# Patient Record
Sex: Female | Born: 1970 | Race: White | Hispanic: No | Marital: Single | Smoking: Never smoker
Health system: Southern US, Community
[De-identification: ages and names within clinical notes are randomized; demographics above are authoritative.]

## PROBLEM LIST (undated history)

## (undated) DIAGNOSIS — Z9289 Personal history of other medical treatment: Secondary | ICD-10-CM

## (undated) DIAGNOSIS — E785 Hyperlipidemia, unspecified: Secondary | ICD-10-CM

## (undated) DIAGNOSIS — G8929 Other chronic pain: Secondary | ICD-10-CM

## (undated) DIAGNOSIS — K3189 Other diseases of stomach and duodenum: Secondary | ICD-10-CM

## (undated) DIAGNOSIS — M5030 Other cervical disc degeneration, unspecified cervical region: Secondary | ICD-10-CM

## (undated) DIAGNOSIS — E039 Hypothyroidism, unspecified: Secondary | ICD-10-CM

## (undated) DIAGNOSIS — R0600 Dyspnea, unspecified: Secondary | ICD-10-CM

## (undated) DIAGNOSIS — I219 Acute myocardial infarction, unspecified: Secondary | ICD-10-CM

## (undated) DIAGNOSIS — K7460 Unspecified cirrhosis of liver: Secondary | ICD-10-CM

## (undated) DIAGNOSIS — M549 Dorsalgia, unspecified: Secondary | ICD-10-CM

## (undated) DIAGNOSIS — H269 Unspecified cataract: Secondary | ICD-10-CM

## (undated) DIAGNOSIS — I252 Old myocardial infarction: Secondary | ICD-10-CM

## (undated) DIAGNOSIS — I10 Essential (primary) hypertension: Secondary | ICD-10-CM

## (undated) DIAGNOSIS — M109 Gout, unspecified: Secondary | ICD-10-CM

## (undated) DIAGNOSIS — M1990 Unspecified osteoarthritis, unspecified site: Secondary | ICD-10-CM

## (undated) DIAGNOSIS — E079 Disorder of thyroid, unspecified: Secondary | ICD-10-CM

## (undated) DIAGNOSIS — F32A Depression, unspecified: Secondary | ICD-10-CM

## (undated) DIAGNOSIS — R1310 Dysphagia, unspecified: Secondary | ICD-10-CM

## (undated) DIAGNOSIS — G4730 Sleep apnea, unspecified: Secondary | ICD-10-CM

## (undated) HISTORY — DX: Unspecified cirrhosis of liver: K74.60

## (undated) HISTORY — DX: Other diseases of stomach and duodenum: K31.89

## (undated) HISTORY — DX: Personal history of other medical treatment: Z92.89

## (undated) HISTORY — PX: TUBAL LIGATION: SHX77

## (undated) HISTORY — PX: KNEE ARTHROSCOPY: SHX127

## (undated) HISTORY — DX: Dysphagia, unspecified: R13.10

## (undated) HISTORY — PX: CARDIAC CATHETERIZATION: SHX172

## (undated) HISTORY — DX: Acute myocardial infarction, unspecified: I21.9

---

## 2004-05-14 ENCOUNTER — Other Ambulatory Visit: Payer: Self-pay

## 2004-09-04 ENCOUNTER — Other Ambulatory Visit: Payer: Self-pay

## 2004-10-14 ENCOUNTER — Other Ambulatory Visit: Payer: Self-pay

## 2004-10-14 ENCOUNTER — Emergency Department: Payer: Self-pay | Admitting: Emergency Medicine

## 2004-12-16 ENCOUNTER — Emergency Department: Payer: Self-pay | Admitting: Emergency Medicine

## 2004-12-17 ENCOUNTER — Ambulatory Visit: Payer: Self-pay | Admitting: Emergency Medicine

## 2005-01-08 ENCOUNTER — Emergency Department: Payer: Self-pay | Admitting: Emergency Medicine

## 2005-01-25 ENCOUNTER — Emergency Department: Payer: Self-pay | Admitting: Emergency Medicine

## 2005-01-27 ENCOUNTER — Emergency Department: Payer: Self-pay | Admitting: Emergency Medicine

## 2005-01-31 ENCOUNTER — Emergency Department: Payer: Self-pay | Admitting: Emergency Medicine

## 2005-02-10 ENCOUNTER — Encounter: Payer: Self-pay | Admitting: Advanced Practice Midwife

## 2005-02-11 ENCOUNTER — Encounter: Payer: Self-pay | Admitting: Advanced Practice Midwife

## 2005-06-19 ENCOUNTER — Observation Stay: Payer: Self-pay

## 2005-06-23 ENCOUNTER — Observation Stay: Payer: Self-pay | Admitting: Unknown Physician Specialty

## 2005-06-25 ENCOUNTER — Inpatient Hospital Stay: Payer: Self-pay | Admitting: Obstetrics & Gynecology

## 2006-05-19 ENCOUNTER — Emergency Department: Payer: Self-pay | Admitting: Emergency Medicine

## 2007-03-28 ENCOUNTER — Emergency Department: Payer: Self-pay | Admitting: Unknown Physician Specialty

## 2007-03-28 ENCOUNTER — Other Ambulatory Visit: Payer: Self-pay

## 2007-09-14 ENCOUNTER — Emergency Department: Payer: Self-pay | Admitting: Emergency Medicine

## 2007-12-15 ENCOUNTER — Emergency Department: Payer: Self-pay | Admitting: Emergency Medicine

## 2008-02-01 ENCOUNTER — Emergency Department: Payer: Self-pay | Admitting: Emergency Medicine

## 2008-02-23 ENCOUNTER — Ambulatory Visit: Payer: Self-pay | Admitting: Orthopaedic Surgery

## 2008-03-01 ENCOUNTER — Ambulatory Visit: Payer: Self-pay | Admitting: Orthopaedic Surgery

## 2008-03-22 ENCOUNTER — Emergency Department: Payer: Self-pay | Admitting: Emergency Medicine

## 2008-07-30 ENCOUNTER — Emergency Department: Payer: Self-pay | Admitting: Emergency Medicine

## 2008-07-31 ENCOUNTER — Emergency Department: Payer: Self-pay | Admitting: Emergency Medicine

## 2008-10-03 ENCOUNTER — Encounter
Admission: RE | Admit: 2008-10-03 | Discharge: 2008-10-03 | Payer: Self-pay | Admitting: Physical Medicine & Rehabilitation

## 2009-02-24 ENCOUNTER — Emergency Department: Payer: Self-pay | Admitting: Emergency Medicine

## 2009-07-01 ENCOUNTER — Ambulatory Visit: Payer: Self-pay

## 2009-07-02 ENCOUNTER — Emergency Department: Payer: Self-pay | Admitting: Emergency Medicine

## 2009-11-18 ENCOUNTER — Ambulatory Visit: Payer: Self-pay | Admitting: General Practice

## 2010-04-26 ENCOUNTER — Emergency Department: Payer: Self-pay | Admitting: Emergency Medicine

## 2010-09-18 ENCOUNTER — Emergency Department: Payer: Self-pay | Admitting: Emergency Medicine

## 2011-08-20 ENCOUNTER — Ambulatory Visit: Payer: Self-pay | Admitting: Family

## 2011-09-16 ENCOUNTER — Emergency Department: Payer: Self-pay | Admitting: Emergency Medicine

## 2012-02-10 ENCOUNTER — Ambulatory Visit: Payer: Self-pay | Admitting: Internal Medicine

## 2012-04-07 ENCOUNTER — Inpatient Hospital Stay: Payer: Self-pay | Admitting: Internal Medicine

## 2012-04-07 LAB — COMPREHENSIVE METABOLIC PANEL
Albumin: 3.3 g/dL — ABNORMAL LOW (ref 3.4–5.0)
Alkaline Phosphatase: 67 U/L (ref 50–136)
Anion Gap: 9 (ref 7–16)
Calcium, Total: 8.3 mg/dL — ABNORMAL LOW (ref 8.5–10.1)
Chloride: 106 mmol/L (ref 98–107)
EGFR (African American): >60
Glucose: 87 mg/dL (ref 65–99)
Potassium: 3.8 mmol/L (ref 3.5–5.1)
SGPT (ALT): 74 U/L
Sodium: 139 mmol/L (ref 136–145)
Total Protein: 7.3 g/dL (ref 6.4–8.2)

## 2012-04-07 LAB — CBC
MCHC: 32 g/dL (ref 32.0–36.0)
MCV: 78 fL — ABNORMAL LOW (ref 80–100)
Platelet: 88 10*3/uL — ABNORMAL LOW (ref 150–440)
RDW: 14.5 % (ref 11.5–14.5)
WBC: 3.1 10*3/uL — ABNORMAL LOW (ref 3.6–11.0)

## 2012-04-07 LAB — URINALYSIS, COMPLETE
Bilirubin,UR: NEGATIVE
Glucose,UR: NEGATIVE mg/dL (ref 0–75)
Leukocyte Esterase: NEGATIVE
Ph: 6 (ref 4.5–8.0)
Specific Gravity: 1.004 (ref 1.003–1.030)
WBC UR: 1 /HPF (ref 0–5)

## 2012-04-07 LAB — CK TOTAL AND CKMB (NOT AT ARMC)
CK, Total: 43 U/L (ref 21–215)
CK, Total: 32 U/L (ref 21–215)
CK-MB: <0.5 ng/mL — ABNORMAL LOW (ref 0.5–3.6)
CK-MB: <0.5 ng/mL — ABNORMAL LOW (ref 0.5–3.6)

## 2012-04-07 LAB — TROPONIN I: Troponin-I: 0.27 ng/mL — ABNORMAL HIGH

## 2012-04-07 LAB — DRUG SCREEN, URINE
Barbiturates, Ur Screen: NEGATIVE (ref ?–200)
Methadone, Ur Screen: NEGATIVE (ref ?–300)
Opiate, Ur Screen: NEGATIVE (ref ?–300)
Tricyclic, Ur Screen: NEGATIVE (ref ?–1000)

## 2012-04-07 LAB — PROTIME-INR: Prothrombin Time: 14.2 secs (ref 11.5–14.7)

## 2012-04-07 LAB — APTT: Activated PTT: 32 secs (ref 23.6–35.9)

## 2012-04-08 LAB — TROPONIN I: Troponin-I: 0.26 ng/mL — ABNORMAL HIGH

## 2012-04-08 LAB — CBC WITH DIFFERENTIAL/PLATELET
Basophil #: 0 10*3/uL (ref 0.0–0.1)
Basophil %: 0.5 %
Eosinophil #: 0.1 10*3/uL (ref 0.0–0.7)
Eosinophil %: 2.9 %
HCT: 27.5 % — ABNORMAL LOW (ref 35.0–47.0)
HGB: 8.9 g/dL — ABNORMAL LOW (ref 12.0–16.0)
Lymphocyte %: 36.9 %
MCH: 25.5 pg — ABNORMAL LOW (ref 26.0–34.0)
MCV: 78 fL — ABNORMAL LOW (ref 80–100)
Monocyte %: 8.8 %
Neutrophil %: 50.9 %
Platelet: 73 10*3/uL — ABNORMAL LOW (ref 150–440)
RBC: 3.51 10*6/uL — ABNORMAL LOW (ref 3.80–5.20)
RDW: 14.9 % — ABNORMAL HIGH (ref 11.5–14.5)
WBC: 3.2 10*3/uL — ABNORMAL LOW (ref 3.6–11.0)

## 2012-04-08 LAB — LIPID PANEL
Cholesterol: 101 mg/dL (ref 0–200)
Ldl Cholesterol, Calc: 56 mg/dL (ref 0–100)

## 2012-04-08 LAB — CK TOTAL AND CKMB (NOT AT ARMC)
CK, Total: 26 U/L (ref 21–215)
CK-MB: <0.5 ng/mL — ABNORMAL LOW (ref 0.5–3.6)

## 2012-04-08 LAB — BASIC METABOLIC PANEL
Anion Gap: 6 — ABNORMAL LOW (ref 7–16)
Chloride: 107 mmol/L (ref 98–107)
Co2: 28 mmol/L (ref 21–32)
Creatinine: 0.52 mg/dL — ABNORMAL LOW (ref 0.60–1.30)
EGFR (African American): >60
EGFR (Non-African Amer.): >60
Osmolality: 281 (ref 275–301)
Potassium: 3.8 mmol/L (ref 3.5–5.1)
Sodium: 141 mmol/L (ref 136–145)

## 2012-04-08 LAB — MAGNESIUM: Magnesium: 1.6 mg/dL — ABNORMAL LOW

## 2012-07-02 LAB — COMPREHENSIVE METABOLIC PANEL
Alkaline Phosphatase: 77 U/L (ref 50–136)
Anion Gap: 9 (ref 7–16)
BUN: 9 mg/dL (ref 7–18)
Bilirubin,Total: 0.4 mg/dL (ref 0.2–1.0)
Calcium, Total: 8.8 mg/dL (ref 8.5–10.1)
Chloride: 104 mmol/L (ref 98–107)
Co2: 29 mmol/L (ref 21–32)
Creatinine: 0.58 mg/dL — ABNORMAL LOW (ref 0.60–1.30)
EGFR (African American): >60
EGFR (Non-African Amer.): >60
SGPT (ALT): 74 U/L
Total Protein: 7.9 g/dL (ref 6.4–8.2)

## 2012-07-02 LAB — CBC
HCT: 35.4 % (ref 35.0–47.0)
MCHC: 32.4 g/dL (ref 32.0–36.0)
Platelet: 112 10*3/uL — ABNORMAL LOW (ref 150–440)
WBC: 4.3 10*3/uL (ref 3.6–11.0)

## 2012-07-03 ENCOUNTER — Inpatient Hospital Stay: Payer: Self-pay | Admitting: Internal Medicine

## 2012-07-03 LAB — LIPID PANEL
HDL Cholesterol: 18 mg/dL — ABNORMAL LOW (ref 40–60)
Triglycerides: 128 mg/dL (ref 0–200)
VLDL Cholesterol, Calc: 26 mg/dL (ref 5–40)

## 2012-07-03 LAB — APTT
Activated PTT: 32 secs (ref 23.6–35.9)
Activated PTT: 76.1 secs — ABNORMAL HIGH (ref 23.6–35.9)

## 2012-07-03 LAB — TSH: Thyroid Stimulating Horm: <0.01 u[IU]/mL — ABNORMAL LOW

## 2012-07-03 LAB — TROPONIN I
Troponin-I: 0.14 ng/mL — ABNORMAL HIGH
Troponin-I: 0.14 ng/mL — ABNORMAL HIGH

## 2012-07-03 LAB — CK TOTAL AND CKMB (NOT AT ARMC)
CK, Total: 54 U/L (ref 21–215)
CK-MB: <0.5 ng/mL — ABNORMAL LOW (ref 0.5–3.6)

## 2012-07-03 LAB — T4, FREE: Free Thyroxine: 1.81 ng/dL — ABNORMAL HIGH (ref 0.76–1.46)

## 2012-07-04 DIAGNOSIS — R748 Abnormal levels of other serum enzymes: Secondary | ICD-10-CM

## 2012-07-04 LAB — PROTIME-INR
INR: 1
Prothrombin Time: 14 secs (ref 11.5–14.7)

## 2012-07-04 LAB — IRON AND TIBC
Iron Bind.Cap.(Total): 393 ug/dL (ref 250–450)
Iron: 33 ug/dL — ABNORMAL LOW (ref 50–170)
Unbound Iron-Bind.Cap.: 360 ug/dL

## 2012-07-04 LAB — COMPREHENSIVE METABOLIC PANEL
Anion Gap: 9 (ref 7–16)
Bilirubin,Total: 0.2 mg/dL (ref 0.2–1.0)
Calcium, Total: 9.6 mg/dL (ref 8.5–10.1)
Chloride: 103 mmol/L (ref 98–107)
Co2: 29 mmol/L (ref 21–32)
Creatinine: 0.55 mg/dL — ABNORMAL LOW (ref 0.60–1.30)
EGFR (African American): >60
EGFR (Non-African Amer.): >60
Osmolality: 282 (ref 275–301)
Sodium: 141 mmol/L (ref 136–145)

## 2012-07-04 LAB — CBC WITH DIFFERENTIAL/PLATELET
Basophil %: 0.6 %
Eosinophil %: 2.3 %
HCT: 32.8 % — ABNORMAL LOW (ref 35.0–47.0)
HGB: 10.7 g/dL — ABNORMAL LOW (ref 12.0–16.0)
Lymphocyte %: 46.3 %
MCH: 25.7 pg — ABNORMAL LOW (ref 26.0–34.0)
Monocyte #: 0.2 x10 3/mm (ref 0.2–0.9)
Monocyte %: 7.9 %
Neutrophil %: 42.9 %

## 2012-07-04 LAB — APTT: Activated PTT: 71.8 secs — ABNORMAL HIGH (ref 23.6–35.9)

## 2012-07-04 LAB — FERRITIN: Ferritin (ARMC): 12 ng/mL (ref 8–388)

## 2012-09-22 ENCOUNTER — Ambulatory Visit: Payer: Self-pay | Admitting: Family

## 2012-10-24 ENCOUNTER — Ambulatory Visit: Payer: Self-pay | Admitting: Pain Medicine

## 2012-10-31 ENCOUNTER — Ambulatory Visit: Payer: Self-pay | Admitting: Pain Medicine

## 2012-11-17 ENCOUNTER — Ambulatory Visit: Payer: Self-pay | Admitting: Pain Medicine

## 2013-02-13 ENCOUNTER — Ambulatory Visit: Payer: Self-pay | Admitting: Pain Medicine

## 2013-02-16 ENCOUNTER — Ambulatory Visit: Payer: Self-pay | Admitting: Pain Medicine

## 2013-03-06 ENCOUNTER — Ambulatory Visit: Payer: Self-pay | Admitting: Pain Medicine

## 2013-05-11 ENCOUNTER — Emergency Department: Payer: Self-pay | Admitting: Emergency Medicine

## 2013-05-30 ENCOUNTER — Emergency Department: Payer: Self-pay | Admitting: Emergency Medicine

## 2013-05-30 LAB — COMPREHENSIVE METABOLIC PANEL
Albumin: 4.4 g/dL (ref 3.4–5.0)
Alkaline Phosphatase: 89 U/L (ref 50–136)
Anion Gap: 3 — ABNORMAL LOW (ref 7–16)
BUN: 13 mg/dL (ref 7–18)
Calcium, Total: 9.5 mg/dL (ref 8.5–10.1)
Creatinine: 0.72 mg/dL (ref 0.60–1.30)
EGFR (Non-African Amer.): >60
Glucose: 96 mg/dL (ref 65–99)
Osmolality: 272 (ref 275–301)
Potassium: 3.4 mmol/L — ABNORMAL LOW (ref 3.5–5.1)
SGOT(AST): 163 U/L — ABNORMAL HIGH (ref 15–37)
SGPT (ALT): 144 U/L — ABNORMAL HIGH (ref 12–78)
Sodium: 136 mmol/L (ref 136–145)
Total Protein: 8.8 g/dL — ABNORMAL HIGH (ref 6.4–8.2)

## 2013-05-30 LAB — URINALYSIS, COMPLETE
Bacteria: NONE SEEN
Bilirubin,UR: NEGATIVE
Glucose,UR: NEGATIVE mg/dL (ref 0–75)
Nitrite: NEGATIVE
Ph: 6 (ref 4.5–8.0)
Protein: NEGATIVE
RBC,UR: 1 /HPF (ref 0–5)

## 2013-05-30 LAB — CBC
HCT: 42 % (ref 35.0–47.0)
MCH: 27.9 pg (ref 26.0–34.0)
MCHC: 34.3 g/dL (ref 32.0–36.0)
Platelet: 134 10*3/uL — ABNORMAL LOW (ref 150–440)
RBC: 5.15 10*6/uL (ref 3.80–5.20)
WBC: 4.6 10*3/uL (ref 3.6–11.0)

## 2013-08-23 LAB — BASIC METABOLIC PANEL WITH GFR
Anion Gap: 8
BUN: 13 mg/dL
Calcium, Total: 8.9 mg/dL
Chloride: 107 mmol/L
Co2: 25 mmol/L
Creatinine: 0.66 mg/dL
EGFR (African American): >60
EGFR (Non-African Amer.): >60
Glucose: 93 mg/dL
Osmolality: 279
Potassium: 3.8 mmol/L
Sodium: 140 mmol/L

## 2013-08-23 LAB — CBC
HCT: 35.1 %
HGB: 12 g/dL
MCH: 28.6 pg
MCHC: 34.2 g/dL
MCV: 84 fL
Platelet: 96 x10 3/mm 3 — ABNORMAL LOW
RBC: 4.19 X10 6/mm 3
RDW: 14.9 % — ABNORMAL HIGH
WBC: 5 x10 3/mm 3

## 2013-08-24 ENCOUNTER — Inpatient Hospital Stay: Payer: Self-pay | Admitting: Internal Medicine

## 2013-08-24 LAB — TROPONIN I
Troponin-I: 0.21 ng/mL — ABNORMAL HIGH
Troponin-I: 0.22 ng/mL — ABNORMAL HIGH

## 2013-08-24 LAB — DRUG SCREEN, URINE
Amphetamines, Ur Screen: NEGATIVE (ref ?–1000)
Barbiturates, Ur Screen: NEGATIVE (ref ?–200)
Benzodiazepine, Ur Scrn: NEGATIVE (ref ?–200)
Methadone, Ur Screen: NEGATIVE (ref ?–300)
Opiate, Ur Screen: NEGATIVE (ref ?–300)

## 2013-08-24 LAB — CK-MB
CK-MB: 0.9 ng/mL (ref 0.5–3.6)
CK-MB: 0.9 ng/mL (ref 0.5–3.6)

## 2013-08-25 LAB — BASIC METABOLIC PANEL
Anion Gap: 4 — ABNORMAL LOW (ref 7–16)
BUN: 9 mg/dL (ref 7–18)
Calcium, Total: 8.8 mg/dL (ref 8.5–10.1)
Chloride: 104 mmol/L (ref 98–107)
Co2: 29 mmol/L (ref 21–32)
EGFR (Non-African Amer.): >60
Glucose: 97 mg/dL (ref 65–99)
Osmolality: 272 (ref 275–301)
Potassium: 4 mmol/L (ref 3.5–5.1)
Sodium: 137 mmol/L (ref 136–145)

## 2013-08-25 LAB — CBC WITH DIFFERENTIAL/PLATELET
Basophil #: 0 10*3/uL (ref 0.0–0.1)
Basophil %: 0.6 %
Eosinophil #: 0.1 10*3/uL (ref 0.0–0.7)
HCT: 32.3 % — ABNORMAL LOW (ref 35.0–47.0)
HGB: 11 g/dL — ABNORMAL LOW (ref 12.0–16.0)
MCH: 28.9 pg (ref 26.0–34.0)
MCHC: 34.2 g/dL (ref 32.0–36.0)
MCV: 84 fL (ref 80–100)
Monocyte %: 7.6 %
Neutrophil %: 52.9 %
Platelet: 78 10*3/uL — ABNORMAL LOW (ref 150–440)
RDW: 15.2 % — ABNORMAL HIGH (ref 11.5–14.5)
WBC: 3 10*3/uL — ABNORMAL LOW (ref 3.6–11.0)

## 2013-08-25 LAB — LIPID PANEL
Ldl Cholesterol, Calc: 67 mg/dL (ref 0–100)
Triglycerides: 107 mg/dL (ref 0–200)

## 2013-08-31 ENCOUNTER — Emergency Department: Payer: Self-pay | Admitting: Emergency Medicine

## 2013-09-04 ENCOUNTER — Emergency Department: Payer: Self-pay | Admitting: Emergency Medicine

## 2013-11-30 ENCOUNTER — Emergency Department: Payer: Self-pay | Admitting: Emergency Medicine

## 2014-02-14 ENCOUNTER — Ambulatory Visit: Payer: Self-pay

## 2014-04-07 ENCOUNTER — Emergency Department: Payer: Self-pay | Admitting: Emergency Medicine

## 2014-04-30 DIAGNOSIS — E039 Hypothyroidism, unspecified: Secondary | ICD-10-CM | POA: Insufficient documentation

## 2014-04-30 DIAGNOSIS — G8929 Other chronic pain: Secondary | ICD-10-CM | POA: Insufficient documentation

## 2014-04-30 DIAGNOSIS — I10 Essential (primary) hypertension: Secondary | ICD-10-CM | POA: Insufficient documentation

## 2014-04-30 DIAGNOSIS — F1920 Other psychoactive substance dependence, uncomplicated: Secondary | ICD-10-CM | POA: Insufficient documentation

## 2014-04-30 DIAGNOSIS — I2510 Atherosclerotic heart disease of native coronary artery without angina pectoris: Secondary | ICD-10-CM | POA: Insufficient documentation

## 2014-04-30 DIAGNOSIS — IMO0002 Reserved for concepts with insufficient information to code with codable children: Secondary | ICD-10-CM | POA: Insufficient documentation

## 2014-04-30 DIAGNOSIS — F329 Major depressive disorder, single episode, unspecified: Secondary | ICD-10-CM | POA: Insufficient documentation

## 2014-04-30 DIAGNOSIS — F419 Anxiety disorder, unspecified: Secondary | ICD-10-CM | POA: Insufficient documentation

## 2014-04-30 DIAGNOSIS — E785 Hyperlipidemia, unspecified: Secondary | ICD-10-CM | POA: Insufficient documentation

## 2015-03-20 ENCOUNTER — Ambulatory Visit: Admit: 2015-03-20 | Disposition: A | Discharge status: Home / Self Care | Payer: Self-pay

## 2015-04-02 NOTE — H&P (Signed)
PATIENT NAME:  Brandi Bright, LOUK MR#:  [id number] DATE OF BIRTH:  [birth year]  DATE OF ADMISSION:  07/03/2012  REFERRING PHYSICIAN: Belva Bertin, MD  PRIMARY CARE PHYSICIAN: Cletis Athens, MD  CHIEF COMPLAINT: Neck pain and right arm pain.   HISTORY OF PRESENT ILLNESS: This is a 44 year old female with significant past medical history of hypertension, hypothyroidism, and coronary artery disease recently admitted to [facility] for similar complaints in April of this year where she was diagnosed with non-ST-elevated myocardial infarction where she was started on aspirin, beta blocker, and ACE. The patient had a negative stress test done in April of this year. The patient presents today with similar complaints where she reports this afternoon she started to feel neck pain in the right side and right arm pain and some mild heaviness where she was found to have elevated troponin of 0.16. The patient's last troponin was 0.26 upon discharge in April of this year. The patient did not have any significant EKG changes. The patient reports her pain got better with aspirin and sublingual nitroglycerin. The patient denies any altered mental status, loss of consciousness, syncope, slurred speech, any focal deficits, any coffee-ground emesis, bright red blood per rectum, dysuria, or polyuria, but she complains of nausea and diaphoresis. She denies any chest pain, but reports some mild chest discomfort.   PAST MEDICAL HISTORY:  1. Hypothyroidism.  2. Coronary artery disease.  3. Restless leg syndrome.  4. Anxiety.  5. Gastroesophageal reflux disease.  6. Anemia.  7. Chronic thrombocytopenia.   PAST SURGICAL HISTORY: Cesarean section.   FAMILY HISTORY: Mother has history of hypertension, diabetes, and chronic obstructive pulmonary disease. Father has hypertension, chronic obstructive pulmonary disease, heart disease, and colon and lung cancer.   SOCIAL HISTORY: The patient denies any smoking,  alcohol, or drug use.   REVIEW OF SYSTEMS: CONSTITUTIONAL: The patient denies any fever, fatigue, or weakness. EYES: Denies any blurry vision, double vision, or pain. ENT: Denies tinnitus, ear pain, or hearing loss. RESPIRATORY: Denies any cough, wheezing, hemoptysis, or dyspnea. CARDIOVASCULAR: Complains of mild chest discomfort. Denies any orthopnea, edema, arrhythmia, palpitations, or syncope. GASTROINTESTINAL: Complains of mild nausea. Denies any vomiting, diarrhea, abdominal pain, or hematemesis. GENITOURINARY: Denies dysuria, hematuria, or renal colic. ENDOCRINE: Denies polyuria, polydipsia, or heat or cold intolerance. HEMATOLOGY: Has history of anemia and low platelet. Denies any easy bruising or bleeding diathesis. INTEGUMENTARY: Denies any acne or rash. MUSCULOSKELETAL: Complains of neck pain and right arm pain. Denies any lower back pain, swelling, or gout. NEURO: Complained of right arm heaviness. Denies any dysarthria, epilepsy, tremors, or vertigo. PSYCH: Denies any insomnia or schizophrenia. Has history of anxiety.   PHYSICAL EXAMINATION:   VITAL SIGNS: Temperature 99, pulse 70, respiratory rate 18, blood pressure 101/70, and saturating 97% on room air.   GENERAL: Well-nourished female who is comfortable and in no apparent distress.   HEENT: Head atraumatic, normocephalic. Pupils equal and reactive to light. Pink conjunctivae. Anicteric sclerae. Moist oral mucosa.   NECK: Supple. No thyromegaly. No JVD.   CHEST: Good air entry bilaterally. No wheezing, rales, or rhonchi.   CARDIOVASCULAR: S1 and S2 heard. No rubs, murmurs, or gallops.   ABDOMEN: Soft, nontender, and nondistended. Bowel sounds present.   EXTREMITIES: No edema. No clubbing and no cyanosis.   PSYCHIATRIC: Appropriate affect. Awake and alert x3. Intact judgment and insight.   NEURO: Cranial nerves grossly intact. Motor 5/5 in all extremities. No deficits and sensation symmetrical and intact.   PERTINENT  LABORATORY AND DIAGNOSTICS: Glucose 96, BUN 9, creatinine 0.58, sodium 142, potassium 3.8, chloride 104, AND CO2 29. Troponin 0.16. White blood cells 4.3, hemoglobin 11.5, hematocrit 35.4, and platelet 112.   EKG is showing normal sinus rhythm. No significant ST or T wave changes.   ASSESSMENT AND PLAN:  1. Right arm and neck pain with elevated troponins. This is similar presentation during last admission in April of this year where the patient was diagnosed with non-ST-elevated myocardial infarction, even though she had negative stress test then. The patient was given 325 mg of aspirin, will be continued on ACE inhibitor, beta blocker, and aspirin. We will a check lipid panel in the a.m. We will cycle three sets of cardiac enzymes and followup the trend. We will start her on heparin drip for presumed non-ST-elevated myocardial infarction and we will consult the cardiology service in the a.m. She will be kept n.p.o. at first for any possible imaging.  2. Hypertension, controlled. Continue with home medication.  3. Hypothyroidism. Check free T4 and TSH. Continue with Synthroid.  4. Gastroesophageal reflux disease. Continue with Protonix.   CODE STATUS: FULL CODE.   TOTAL TIME SPENT ON PATIENT CARE: 45 minutes.  ____________________________ Albertine Patricia, MD dse:slb D: 07/03/2012 01:15:50 ET    T: 07/03/2012 11:28:23 ET       JOB#: [id number] cc: Albertine Patricia, MD, <Dictator> Cletis Athens, MD DAWOOD Graciela Husbands MD ELECTRONICALLY SIGNED 07/03/2012 23:11

## 2015-04-02 NOTE — Consult Note (Signed)
PATIENT NAME:  Brandi Bright, Brandi Bright MR#:  [id number] DATE OF BIRTH:  1971-02-07  DATE OF CONSULTATION:  07/03/2012  REFERRING PHYSICIAN:   CONSULTING PHYSICIAN:  Cletis Athens, MD  HISTORY OF PRESENT ILLNESS: This is a 44 year old female who was admitted into the hospital with right-sided neck pain and right shoulder pain. The pain lasted for two hours. She denies any history of chest pain but she claimed that the pain is similar to when she had her heart attack about four months ago. She became nauseous, did not vomit. There is no sweating. The patient has a history of non-ST-elevated myocardial infarction in April of 2013, also known to have hypertension, hypothyroidism, gastroesophageal reflux disease, and thrombocytopenia. She has chronic liver disease with an abnormal liver function test. The patient says that she is not very compliant on her diet. The patient takes an aspirin, beta blocker, and ACE inhibitor. Her previous stress test was unremarkable   PAST MEDICAL HISTORY:  1. Hypothyroidism. 2. Coronary artery disease.  3. Restless leg syndrome. 4. Anxiety neurosis. 5. Gastroesophageal reflux disease. 6. Anemia.  7. Chronic thrombocytopenia.   FAMILY HISTORY: History is significant for hypertension, diabetes, and chronic obstructive pulmonary disease. Father has hypertension, chronic obstructive pulmonary disease and heart disease. Colon and lung cancer run in the family.  REVIEW OF SYSTEMS: She denies any history of fevers, chills, wheezing, nausea or vomiting, or GU symptoms. Hematology is negative except for thrombocytopenia and she has abnormal liver function tests.   PHYSICAL EXAMINATION:   GENERAL: The patient is a well-nourished female. She is alert and cooperative and in no acute distress at the time of examination.  VITALS: Temperature 97, blood pressure 470 systolic and diastolic 70, and oxygen saturation 97%.   HEENT: Head is normocephalic. Pupils are reactive. Sclerae  anicteric. Tongue is moist, papillated.   NECK: Supple. Jugular venous pressure is not elevated. Carotid upstroke is 2+ without any bruit. There is no goiter. No lymphadenopathy. Trachea is central.   CARDIOVASCULAR: Apical impulse is not palpable. Both first and second heart sounds are normal. No murmur is audible.   CHEST: Decreased breath sounds without any rales or rhonchi.   ABDOMEN: Soft and nontender without any hepatosplenomegaly.   NEUROLOGIC: Unremarkable.   RESULTS: Electrocardiogram does not show any acute changes.   HDL is 18. Creatinine is 0.58. Troponin is borderline elevated at 0.16, 0.14, and 0.14. CPK total is 54 with positive CK-MB. Thyroxine free is 1.81 and TSH is 0.010. Hemoglobin is 11.5 and platelet count is 112.   IMPRESSION: 1. Angina pectoris.  2. Non-ST-elevated myocardial infarction. 3. Chronic obstructive pulmonary disease. 4. Abnormal liver function tests. 5. Hypertension, under control at the present time.  6. Low TSH. We will probably have to decrease the dose of Synthroid. 7. Gastroesophageal reflux disease.        ASSESSMENT AND RECOMMENDATIONS: The patient's symptoms and lab data are suggestive of subendocardial infarction. I told the patient that she will need a cardiac catheterization and we will try to arrange it as soon as possible. In the meantime, we will continue her present medications. ____________________________ Cletis Athens, MD jm:slb D: 07/03/2012 14:27:59 ET T: 07/03/2012 15:36:08 ET JOB#: [id number]  cc: Cletis Athens, MD, <Dictator> Cletis Athens MD ELECTRONICALLY SIGNED 08/04/2012 19:21

## 2015-04-02 NOTE — Discharge Summary (Signed)
PATIENT NAME:  Brandi Bright, Brandi Bright MR#:  [id number] DATE OF BIRTH:  01-Sep-1971  DATE OF ADMISSION:  07/03/2012 DATE OF DISCHARGE:  07/05/2012  ADMITTING PHYSICIAN: Phillips Climes, MD  DISCHARGING PHYSICIAN: Gladstone Lighter, MD  PRIMARY CARE PHYSICIAN: Cletis Athens, MD  CONSULTANTS: 1. Cletis Athens, MD - Cardiology. 2. Lujean Amel, MD - Cardiology (catheterization).  DISCHARGE DIAGNOSES:  1. Unstable angina.  2. Elevated troponin secondary to demand ischemia. 3. Low normal blood pressure while in the hospital.  4. Gastroesophageal reflux disease.  5. Anxiety.  6. Hyperthyroidism while on thyroid supplements.   DISCHARGE MEDICATIONS:  1. Tylenol 500 mg p.o. every six hours p.r.n.  2. Requip 2 mg p.o. at bedtime.  3. Meloxicam 15 mg once a day with food.  4. Xanax 0.25 mg p.o. daily.  5. Aspirin 81 mg p.o. daily.  6. Simvastatin 20 mg p.o. daily.  7. Zoloft 25 mg p.o. daily.  8. Levothyroxine dose decreased to 100 mcg p.o. daily.   DISCHARGE DIET: Low-sodium diet.   DISCHARGE ACTIVITY: As tolerated.  FOLLOWUP INSTRUCTIONS: Followup with Dr. Cletis Athens in 1 week.  Followup TSH level in 1 to 2 weeks.  LABS AND IMAGING STUDIES: WBC 2.6, hemoglobin 10.7, hematocrit 32.8, and platelet count 80.  Sodium 141, potassium 3.8, chloride 103, bicarbonate 29, BUN 12, creatinine 0.55, glucose 109, and calcium 9.6.   ALT 65, AST 81, alkaline phosphatase 71, total bilirubin 0.2, and albumin 3.2. TSH is less than 0.10. Serum iron 33, saturation 8%, iron binding capacity 393, and ferritin 12. INR 1.0. Troponin elevated on admission at 0.14. Free thyroxine elevated at 1.81. LDL 40, HDL 18, triglycerides 128, and total cholesterol 84.   Chest x-ray on admission: Mild loss of height of mid to lower thoracic vertebral bodies. No acute cardiopulmonary disease.   BRIEF HOSPITAL COURSE: Brandi Bright is a 44 year old female with recent admission for NSTEMI to [facility] and also history of hypertension  and hypothyroidism on thyroid supplements who comes back with right-sided chest pain and neck pain and mildly elevated troponin of 0.14 without any EKG changes. So she was initially admitted for NSTEMI.  1. Possible muscular chest pain or unstable angina with mildly elevated troponin. Could be demand ischemia. Because of her recent NSTEMI and never having a catheterization and this is the second admission with an elevated troponin, she was seen by a cardiologist, Dr. Cletis Athens, and also had a cardiac catheterization done by Dr. Clayborn Bigness. However, the cardiac catheterization showed normal ejection fraction of 55% and normal coronaries without any significant coronary artery atherosclerotic disease. So, she was medically managed and has been chest pain free while in the hospital. She will be discharged home in stable condition. She is on aspirin. However, she could not be placed on any beta blocker secondary to bradycardia and also low normal blood pressure.  2. Hypertension with low normal blood pressure. Her lisinopril and beta blocker has been stopped at discharge and will follow with Dr. Cletis Athens.  3. Hyperthyroidism while on thyroid supplement for hypothyroidism. Her Synthroid 200 mcg was stopped for three days in the hospital and the dose has been reduced to half dose, that is at 100 mcg p.o. daily, and we will have a repeat TSH done in the next couple of weeks.  4. Restless leg syndrome and anxiety. All her other home medications were continued. Her course has been otherwise uneventful in the hospital.   DISCHARGE CONDITION: Stable.   DISCHARGE DISPOSITION: Home.  TIME SPENT ON  DISCHARGE: 45 minutes. ____________________________ Gladstone Lighter, MD rk:slb D: 07/05/2012 15:11:51 ET T: 07/06/2012 11:24:05 ET JOB#: [id number]  cc: Gladstone Lighter, MD, <Dictator> Cletis Athens, MD  Gladstone Lighter MD ELECTRONICALLY SIGNED 07/11/2012 15:13

## 2015-04-02 NOTE — Consult Note (Signed)
PATIENT NAME:  Brandi Bright, Brandi Bright MR#:  [id number] DATE OF BIRTH:  September 30, 1971  DATE OF CONSULTATION:  07/04/2012  REFERRING PHYSICIAN:  Dr. Lavera Guise  CONSULTING PHYSICIAN:  Rion Schnitzer D. Petronella Shuford, MD  INDICATION: Angina, chest pain, right neck discomfort.   HISTORY OF PRESENT ILLNESS: Patient is a 44 year old white female with history of hypertension, hypothyroidism, coronary artery disease, recently admitted to [facility] with a similar complaint three months ago found to have non-Q-wave myocardial infarction. She was treated medically with aspirin, beta blockers, ACE inhibitor. Had a functional study and again was treated medically. Patient was doing reasonably well but started having recurring chest pain, neck pain, right arm pain so was admitted for further evaluation and care. Patient was found to have a troponin of 0.26 back in April but she had recurrent chest pain with nonspecific EKG changes but has persistent pain so cardiology consultation was done.  REVIEW OF SYSTEMS: No blackout spells, syncope. No nausea, vomiting. No fever. No chills. No sweats. No weight loss. No weight gain. No hemoptysis, hematemesis. No bright red blood per rectum. No vision change, hearing change. No sputum production or cough.   PAST MEDICAL HISTORY:  1. Hypothyroidism. 2. Coronary artery disease. 3. Restless leg syndrome.  4. Anxiety.  5. Reflux.  6. Anemia.  7. Chronic thrombocytopenia. 8. Mild obesity.   PAST SURGICAL HISTORY: Cesarean section.   FAMILY HISTORY: Hypertension, diabetes, chronic obstructive pulmonary disease, hypothyroidism, lung cancer.   SOCIAL HISTORY: Negative.   PHYSICAL EXAMINATION:  VITAL SIGNS: Blood pressure 105/70, pulse 95, respiratory rate 16, afebrile.   HEENT: Normocephalic, atraumatic. Pupils reactive to light.   NECK: Supple. No jugular venous distention, bruits, adenopathy.   LUNGS: Clear to auscultation and percussion. No wrr.    HEART: Regular  rate and rhythm. Positive bowel sounds. No rebound, guarding, tenderness.   EXTREMITIES: No cyanosis, clubbing, edema.   SKIN: Normal.   LABORATORY, DIAGNOSTIC AND RADIOLOGICAL DATA: Glucose 96, BUN 9, creatinine 0.58, sodium 142, potassium 3.8, chloride 104, CO2 29, troponin 0.16, white count 4.3, hemoglobin 11.5, hematocrit 35.4, platelet count 112. Electrocardiogram normal sinus rhythm, nonspecific findings.   ASSESSMENT:  1. Unstable angina. 2. Chest pain. 3. Elevated troponins. 4. History of non-Q-wave myocardial infarction. 5. Hypertension. 6. Hypothyroidism. 7. Reflux. 8. Obesity.   PLAN: Agree with admit. Rule out for myocardial infarction. Continue telemetry. Would recommend cardiac catheterization to definitely evaluate for significant coronary disease and history of myocardial infarction and recurrent chest pain. Continue lipid management. Recommend weight loss and exercise. Continue reflux therapy. Continue aspirin, beta blockers and ACE inhibitor.     Would proceed with cardiac catheterization to fully evaluate the patient's cardiac risk  and anatomy.  ____________________________ Loran Senters Clayborn Bigness, MD ddc:cms D: 07/04/2012 13:13:50 ET T: 07/04/2012 13:41:29 ET JOB#: [id number]  cc: Jahfari Ambers D. Clayborn Bigness, MD, <Dictator> Yolonda Kida MD ELECTRONICALLY SIGNED 07/05/2012 9:17

## 2015-04-05 NOTE — Consult Note (Signed)
PATIENT NAME:  Brandi Bright, Brandi Bright DATE OF BIRTH:  [birth year]  DATE OF CONSULTATION:  08/24/2013  REFERRING PHYSICIAN:  Corky Downs, MD CONSULTING PHYSICIAN:  Shain Pauwels D. Reniya Mcclees, MD  INDICATION: Chest pain and angina.  HISTORY OF PRESENT ILLNESS: Brandi Bright is a 44 year old white female, obese, with hypothyroidism, history of possible non-STEMI a year ago. She has a strong family history of coronary artery disease, came in with acute shortness of breath and chest pain at rest while at home. Chest pain was discomfort and retrosternal with radiation to the left arm, waxed and wane in nature, so she finally came in. She underwent cardiac cath a year ago with no significant coronary artery disease. EMS brought her in this time and asked her to have further work-up. Initial EKG was nonspecific, but troponin was slightly elevated, so she was admitted for further evaluation and care. She has done reasonably well in the unit with no further chest pain.   FAMILY HISTORY: COPD, hypertension ad diabetes. Sister with non-Q-wave myocardial infarction. Father with coronary artery disease.   SOCIAL HISTORY: Denies smoking or any significant alcohol consumption.   PAST MEDICAL HISTORY:  1.  Hypothyroidism. 2.   Restless leg syndrome. 3.  Anxiety.  4.  Reflux.  5.  Mild obesity.  6.  Mild anemia.  7.  Noncardiac chest pain.   ALLERGIES: AMOXICILLIN, GABAPENTIN, VICODIN.   MEDICATIONS: She is on: 1.  Aspirin 81 mg a day. 2.  Coreg 3.125 mg twice a day. 3.  HCTZ 12.5 mg twice a day. 4.  Synthroid 150 mcg a day.  5.  Nitrostat 0.4 mg daily.  6.  Requip 2 mg at bedtime. 7.  Sertraline 25 mg by mouth daily.  8.  Simvastatin 20 mg at bedtime.  PHYSICAL EXAMINATION: VITAL SIGNS: Blood pressure 140/70, pulse 75, respiratory rate 18, afebrile.  HEENT: Normocephalic, atraumatic. Pupils are reactive to light.  NECK: Supple. No significant JVD, bruits or adenopathy.  LUNGS: Clear to auscultation  and percussion. No significant wheeze, rhonchi or rale.  HEART: Regular rate and rhythm. Positive bowel sounds. No rebound, guarding or tenderness.  EXTREMITY: Within normal limits.  NEUROLOGIC: Intact.  SKIN: Normal.   LABORATORY AND DIAGNOSTICS: Sodium 140, potassium 3.8, chloride 107, bicarb 25, BUN 13, creatinine 0.66. Troponin 0.21. White count 5, hemoglobin 12, platelet count 96.   EKG: Normal sinus rhythm, rate of 75, nonspecific findings.   REVIEW OF SYSTEMS:  No blackout spells or syncope. No nausea or vomiting. No fever. No chills. No sweats. No weight loss. No hemoptysis or hematemesis. No bright red blood per rectum. No vision change or hearing change. Denies sputum production or cough.   ASSESSMENT: 1.  Chest pain, possible angina. 2.  Elevated troponins. 3.  Hypothyroidism. 4.  History of hypertension.  5.  Restless leg syndrome. 6.  Anxiety. 7.  Obesity.   PLAN: Recommend rule out for myocardial infarction. If troponins do not increase significantly above 0.2, we will treat the patient medically. I do not recommend repeat cardiac cath. Consider echocardiogram for assessment of left ventricular function and wall motion. Would recommend gastrointestinal work-up and/or anxiety therapy. I do not recommend invasive cardiac evaluation or cardiac cath at this point. Would recommend the patient refrain from alcohol consumption and consider gastrointestinal therapy with antireflux medication and antianxiety medication. Will continue other therapies for now. Continue thyroid therapy. Continue hyper-cholesterol therapy with simvastatin. Continue low dose Coreg. Continue HCTZ for hypertension. Continue aspirin therapy for primary prevention.  Again, I will treat the patient conservatively and not recommend cardiac cath at this point.  ____________________________ Bobbie Stack. Juliann Pares, MD ddc:sb D: 08/24/2013 15:39:34 ET T: 08/24/2013 15:59:13 ET JOB#: [id number]  cc: Rahcel Shutes D. Juliann Pares, MD,  <Dictator> Alwyn Pea MD ELECTRONICALLY SIGNED 09/11/2013 10:13

## 2015-04-05 NOTE — H&P (Signed)
PATIENT NAME:  Brandi Bright, Brandi Bright MR#:  [id number] DATE OF BIRTH:  01/17/1971  DATE OF ADMISSION:  08/24/2013  REFERRING PHYSICIAN:  Dr. Dolores Frame.   PRIMARY CARE PHYSICIAN:  Dr. Juel Burrow.   HISTORY OF PRESENT ILLNESS:  Ms. Laessig is a 44 year old Caucasian female with past medical history of hypothyroidism, history of NSTEMI back in July of 2013 as well as a strong family history of early coronary artery disease who is presenting with acute onset of shortness of breath while at home at rest.  She noted associated chest pain which she described as discomfort which was retrosternal in nature, radiating to the left midclavicular line, 2 to 4 out of 10 in intensity which was waxing and waning in nature without worsening or relieving factors.  She stated that this was actually much milder than her previous NSTEMI symptoms.  Despite this, she also had associated diaphoresis.  Upon prompting with her family members, they decided to call EMS for further work-up and evaluation.  Prior to this episode she has not had any recent anginal symptoms or any decrease in activity.  She denies any previous bouts of chest pain, shortness of breath, palpitations preceding this event.  In the Emergency Department initial EKG, normal sinus rhythm without any ST or T abnormalities, though initial troponin was elevated at 0.2.    REVIEW OF SYSTEMS: CONSTITUTIONAL:  She denies any fevers, chills, fatigue.  EYES:  Denies any blurred vision or pain.  EARS, NOSE, THROAT:  Denies any hearing loss or tinnitus.  RESPIRATORY:  Shortness of breath as above.  She denies any cough or wheeze associated.  CARDIOVASCULAR:  Chest pain as above.  She denies any orthopnea or edema.  GASTROINTESTINAL:  Denies any nausea, vomiting, diarrhea.  GENITOURINARY:  She denies dysuria or increased frequency.  ENDOCRINE:  Denies any nocturia or thyroid problems.  HEMATOLOGIC AND LYMPHATIC:  Denies easy bruising or bleeding.  SKIN:  Denies any rashes or lesions.   MUSCULOSKELETAL:  Attests to chronic low back pain, however denies any neck pain.  NEUROLOGIC:  No paralysis or paresthesias.  PSYCHIATRIC:  Denies any anxiety or depressive symptoms.   PAST MEDICAL HISTORY:  Hypothyroidism, restless leg syndrome, anxiety, gastroesophageal reflux disease as well as anemia.   FAMILY HISTORY:  Mother with COPD, hypertension, diabetes.  Sister with recent NSTEMI as well as father with history of heart disease.   SOCIAL HISTORY:  She denies any tobacco usage.  Does mention occasional alcohol usage.   ALLERGIES:  AMOXICILLIN, GABAPENTIN AS WELL AS VICODIN.   HOME MEDICATIONS:  Include aspirin 81 mg by mouth daily, Coreg 3.125 mg by mouth twice daily, hydrochlorothiazide 12.5 mg by mouth twice daily, Synthroid 150 mcg by mouth daily, Nitrostat 0.4 sublingual tablet every five minutes as needed for chest pain, Requip 2 mg by mouth at bedtime, sertraline 25 mg by mouth daily, simvastatin 20 mg by mouth at bedtime.   PHYSICAL EXAMINATION:  GENERAL:  No acute distress, awake, alert and oriented x 3.  HEENT:  Normocephalic, atraumatic.  Extraocular muscles intact.  Pupils equal, round and reactive to light as well as accommodation.  Moist mucosal membranes.  CARDIOVASCULAR:  S1, S2, regular rate and rhythm.  No murmurs, rubs or gallops.  CHEST:  Nontender to palpation, JVD nondistended.  PULMONARY:  Clear to auscultation bilaterally without wheezes, rubs or rhonchi.  ABDOMEN:  Soft, nontender, nondistended with positive bowel sounds.  EXTREMITIES:  Reveal no cyanosis, edema or clubbing.  NEUROLOGIC:  Cranial nerves II through  XII intact.  No gross neurological deficits.   LABORATORY DATA:  Sodium 140, potassium 3.8, chloride 107, bicarb 25, BUN 13, creatinine 0.66, troponin I 0.21.  WBC 5, hemoglobin 12, platelets of 96.  EKG, normal sinus rhythm, heart rate 77.  No ST or T wave abnormalities   ASSESSMENT AND PLAN:  A 44 year old Caucasian female with past medical  history of hyperthyroidism with a history of non-ST-elevation myocardial infarction back in July of 2013 presenting with acute onset of shortness of breath as well as associated chest pain which she described as discomfort.  On initial evaluation EKG was within normal limits.  Her cardiac enzymes were elevated. 1.  Non-ST-elevation myocardial infarction.  She has been given nitro, aspirin as well as Lovenox in the Emergency Department.  We will also trend cardiac enzymes x 3, repeat EKG in the morning.  Check a transthoracic echocardiogram as well as a lipid panel.  Cardiology consult with Dr. Juel Burrow who is her PCP.  Give a dose of Lipitor 80 mg now and then continue with Coreg as well as her statin therapy.  2.  Hypothyroidism.  Continue with Synthroid.   3.  Restless leg syndrome.  Continue with Requip.   4.  Anxiety, depression, not otherwise specified.  Continue with Zoloft.  5.  The patient is FULL CODE.   Total time spent 33 minutes.    ____________________________ Cletis Athens. Amaury Kuzel, MD dkh:ea D: 08/24/2013 00:27:22 ET T: 08/24/2013 01:34:34 ET JOB#: [id number]  cc: Cletis Athens. Yasenia Reedy, MD, <Dictator> Ridgely Anastacio Synetta Shadow MD ELECTRONICALLY SIGNED 08/25/2013 1:21

## 2015-04-05 NOTE — Discharge Summary (Signed)
PATIENT NAME:  Brandi Bright, Brandi Bright MR#:  [id number] DATE OF BIRTH:  [birth year]  DATE OF ADMISSION:  08/24/2013 DATE OF DISCHARGE:  08/25/2013  PRIMARY CARE PHYSICIAN: Cletis Athens, MD  FINAL DIAGNOSES: 1.  Chest pain with shortness of breath, borderline elevated troponin and history of myocardial infarction.  2.  Gastroesophageal reflux disease.  3.  Hypertension.  4.  Arthritis.  5.  Hypothyroidism.  6.  Thrombocytopenia.   DISCHARGE MEDICATIONS: Include: 1.  Ropinirole 2 mg at bedtime. 2.  Carvedilol 3.125 mg twice a day. 3.  Levothyroxine 150 mcg daily. 4.  Nitrostat 0.4 mg sublingual tablet every 5 minutes as needed for chest pain. 5.  Percocet 5/325 mg every 8 hours as needed for pain. 6.  Zoloft 25 mg daily. 7.  Simvastatin 20 mg at bedtime. 8.  Omeprazole 20 mg twice a day.   DIET: Low sodium diet, regular consistency.   DISCHARGE FOLLOWUP:  With Dr. Lavera Guise in 1 to 2 weeks.   REASON FOR ADMISSION: The patient was admitted 08/24/2013 and discharged 08/25/2013. The patient came in with chest pain.   HISTORY OF PRESENT ILLNESS: A 44 year old female with past medical history of MI. She presented with chest pain and shortness of breath and found to have a borderline troponin. Was admitted to the hospital. Dr. Lavera Guise with the borderline troponin consulted Dr. Clayborn Bigness for further evaluation. Dr. Clayborn Bigness recommended medical management since the patient did have a cardiac cath that was negative last year.   LABORATORY AND RADIOLOGICAL DATA DURING THE HOSPITAL COURSE: Included an EKG that showed normal sinus rhythm, no acute ST-T wave changes. Troponin borderline at 0.21. White blood cell count 5.0, H and H 12.0 and 35.1, platelet count 96. Glucose 93, BUN 13, creatinine 0.66, sodium 140, potassium 3.8, chloride 107, CO2 25, calcium 8.9. Chest x-ray: No evidence of CHF or pneumonia. Next troponin borderline at 0.21. Echocardiogram showed left ventricular ejection fraction 55% to 60%, dilated  left and right atrium, mild aortic regurgitation, mild tricuspid regurgitation. Urine toxicology was negative. The next troponin 0.22. CT chest showed no pulmonary embolism. Hemoglobin upon discharge 11.0, platelet count 78, total cholesterol 109, HDL 21, LDL 67 and creatinine 0.68.   HOSPITAL COURSE PER PROBLEM LIST:  1.  For the patient's chest pain, shortness of breath and borderline elevated troponin with history of myocardial infarction, I do not believe this is a myocardial infarction at all. The patient's troponins were only borderline. The patient looking back through last year's medical record had a negative cardiac cath. The patient was seen in consultation by cardiology who did not recommend repeating a cardiac cath at this time, treating with medical management. I believe the pain was most likely epigastric pain, and I am treating for GERD. This is why I did not send the patient home with aspirin. The patient will be on Coreg, Nitrostat and simvastatin.  2.  Gastroesophageal reflux disease with epigastric pain. Treat with omeprazole 20 mg twice a day. If no improvement in symptoms, can consider an endoscopy as outpatient.  3.  History of hypertension. Blood pressure 101/74 upon discharge.  4.  Arthritis. The patient was asking for pain medications. I gave a small script of Percocet.  5.  Hypothyroidism. On levothyroxine.  6.  Thrombocytopenia. This is chronic, can be worked up as outpatient. I did stop aspirin and NSAIDs at this time secondary to epigastric pain.   TIME SPENT ON DISCHARGE: 35 minutes.  ____________________________ Tana Conch. Leslye Peer, MD rjw:sb D: 08/29/2013 08:24:47  ET T: 08/29/2013 08:43:37 ET JOB#: [id number]  cc: Tana Conch. Leslye Peer, MD, <Dictator> Cletis Athens, MD Marisue Brooklyn MD ELECTRONICALLY SIGNED 09/07/2013 14:37

## 2015-04-07 NOTE — H&P (Signed)
PATIENT NAME:  Brandi Bright, Brandi Bright MR#:  [id number] DATE OF BIRTH:  03-18-1971  DATE OF ADMISSION:  04/07/2012  REFERRING PHYSICIAN: ER physician, Dr. Benjaman Lobe   PRIMARY CARE PHYSICIAN: Dr. Lavera Guise    CHIEF COMPLAINT: Right upper extremity weakness, chest pressure.   HISTORY OF PRESENT ILLNESS: The patient is a 44 year old female with history of anxiety, gastroesophageal reflux disease, hypothyroidism, anemia, and restless leg syndrome who reports that she had some pain in her neck on Saturday when she turned her head suddenly and felt a pop in her neck. Subsequently she felt some swelling on the left side of her face which has improved. She also developed right upper extremity weakness, numbness, and had some slurred speech on that day. On Tuesday the patient had some chest heaviness associated with nausea, vomiting, and she felt like she was going to pass out. She describes the chest pressure as something sitting on her chest. Her predominant complaint currently is right upper extremity weakness, pain, and chest heaviness. She denies any prior history of cardiac problems. She does have a history of anxiety. The patient went to see her PCP for the above-mentioned complaints and was sent to the Emergency Room for further evaluation.   ALLERGIES: Amoxicillin, Vicodin.   CURRENT MEDICATIONS:  1. Xanax 0.25 mg at bedtime.  2. Neurontin 100 mg b.i.d.  3. Synthroid 200 mcg daily.  4. Meloxicam 15 mg daily.  5. Omeprazole 20 mg daily.  6. Requip 2 mg daily.  7. Tramadol 50 mg t.i.d.  8. Tylenol 500 mg as needed.   PAST MEDICAL HISTORY:  1. Hypothyroidism. 2. Restless leg syndrome.  3. Anxiety.  4. Gastroesophageal reflux disease.  5. Anemia for which patient says she takes over-the-counter iron pills.  6. The patient receiving blood transfusion in 1991.   PAST SURGICAL HISTORY: Cesarean section.   FAMILY HISTORY: Mother has extensive medical problems including COPD, hypertension, diabetes,  restless leg syndrome, anemia, and thyroid problems. Father has hypertension, colon and lung cancer, COPD, and heart disease.   SOCIAL HISTORY: The patient denies any smoking, alcohol, or drug abuse. She lives with her boyfriend. Has two children. She is a Agricultural engineer.   REVIEW OF SYSTEMS: CONSTITUTIONAL: Denies any fever, fatigue. EYES: Denies any blurred or double vision. ENT: Denies any tinnitus, ear pain. RESPIRATORY: Denies any cough, wheezing. CARDIOVASCULAR: Reports chest pain. Denies any palpitations, syncope. GI: Reported one episode of nausea and vomiting two days ago. Denies any abdominal pain. GU: Denies any dysuria or hematuria. ENDOCRINE: Denies any polyuria, nocturia. HEME/LYMPH: Denies any anemia or easy bruisability. INTEGUMENTARY: Denies any acne, rash. MUSCULOSKELETAL: Denies any swelling, gout. NEUROLOGICAL: Reports right upper extremity weakness and numbness. PSYCH: Has history of anxiety.   PHYSICAL EXAMINATION:   VITAL SIGNS: Temperature 98.2, heart rate 81, respiratory rate 18, blood pressure 157/89, pulse oximetry 100% on room air.   GENERAL: The patient is a 44 year old Caucasian female lying in bed. She is very anxious.   HEAD: Atraumatic, normocephalic.   EYES: There is pallor. No icterus or cyanosis. Pupils equal, round, and reactive to light and accommodation. Extraocular movements intact.   ENT: Wet mucous membranes. No oropharyngeal erythema or thrush.   NECK: Supple. No masses. No JVD. No thyromegaly. No lymphadenopathy.   CHEST WALL: No tenderness to palpation. Not using accessory muscles of respiration. No intercostal retraction. Lungs bilaterally clear to auscultation. No wheezing, rales, or rhonchi.   CARDIOVASCULAR: S1 and S2 regular. There are no murmur, rubs, or gallops.   ABDOMEN:  Soft, nontender, nondistended. No guarding. No rigidity. No organomegaly.   SKIN: No rashes or lesions.   PERIPHERIES: No pedal edema. 2+ pedal pulses.   MUSCULOSKELETAL:  No cyanosis or clubbing.   NEUROLOGIC: The patient is awake, alert, oriented x3. Cranial nerves are grossly intact. The patient has isolated right upper extremity motor strength 4 out of 5. Sensory grossly intact.   PSYCH: Very anxious.   LABORATORY, DIAGNOSTIC, AND RADIOLOGICAL DATA: CK 43. Troponin 0.27. Glucose 87, BUN 9, creatinine 0.54, sodium 139, potassium 3.8, chloride 106, CO2 24, calcium 8.3, AST 85, white count 3.1, hemoglobin 10.2, hematocrit 31.7, platelet count 88.   CAT scan of the head showed no acute abnormality.    ASSESSMENT AND PLAN: This is a 44 year old female with past medical history of hypertension, restless leg syndrome, anxiety, and gastroesophageal reflux disease who presented with right upper extremity weakness, numbness, and chest pressure.  1. Chest pressure/possible unstable angina. The patient reports having chest heaviness with one episode of nausea, vomiting, and right upper extremity pain for the past 2 to 3 days. Her troponins are elevated. There are some EKG changes with T wave inversion in V1, T wave flattening in V3, and T wave inversion in lead 3. Will admit the patient to the hospital. Start her on aspirin, beta-blocker, ACE, statin, nitro paste, and heparin drip. Will check serial cardiac enzymes. Will obtain an echo and also stress test. Have discussed with the patient's PCP regarding plan of care.  2. Possible CVA. The patient's initial CAT scan did not show any abnormality. Will treat the patient with aspirin. Get an MRI of the brain, carotid ultrasound, echo. Will get PT and OT evaluation.  3. Anemia. The patient reports she has chronic anemia and takes oral over-the-counter iron pills.   4. Thrombocytopenia. It is unclear how long the patient has had this. Will defer management and work-up to the patient's PCP. Platelet count will have to be monitored closely on aspirin and heparin drip.  5. Mildly elevated AST, possibly nonspecific. The patient denies  any abdominal pain. 6. Elevated blood pressure, could be due to anxiety. Will treat with beta-blocker and ACE inhibitor.  Reviewed all medical records, discussed with the ED physician, discussed with the patient's PCP. Discussed with the patient and her partner the plan of care and management.   TIME SPENT: 75 minutes.   ____________________________ Cherre Huger, MD sp:drc D: 04/07/2012 14:07:55 ET T: 04/07/2012 14:34:04 ET JOB#: [id number]  cc: Cherre Huger, MD, <Dictator> Cletis Athens, MD Cherre Huger MD ELECTRONICALLY SIGNED 04/08/2012 17:17

## 2016-01-11 ENCOUNTER — Encounter: Payer: Self-pay | Admitting: Emergency Medicine

## 2016-01-11 ENCOUNTER — Emergency Department
Admission: EM | Admit: 2016-01-11 | Discharge: 2016-01-11 | Disposition: A | Discharge status: Home / Self Care | Payer: Medicaid Other | Attending: Emergency Medicine | Admitting: Emergency Medicine

## 2016-01-11 DIAGNOSIS — Z880 Allergy status to penicillin: Secondary | ICD-10-CM | POA: Insufficient documentation

## 2016-01-11 DIAGNOSIS — J029 Acute pharyngitis, unspecified: Secondary | ICD-10-CM

## 2016-01-11 HISTORY — DX: Disorder of thyroid, unspecified: E07.9

## 2016-01-11 HISTORY — DX: Dorsalgia, unspecified: M54.9

## 2016-01-11 HISTORY — DX: Unspecified osteoarthritis, unspecified site: M19.90

## 2016-01-11 HISTORY — DX: Old myocardial infarction: I25.2

## 2016-01-11 LAB — POCT RAPID STREP A: Streptococcus, Group A Screen (Direct): NEGATIVE

## 2016-01-11 MED ORDER — IBUPROFEN 800 MG PO TABS: 800.0000 mg | ORAL_TABLET | Freq: Three times a day (TID) | ORAL | Status: DC | PRN

## 2016-01-11 MED ORDER — AZITHROMYCIN 250 MG PO TABS: ORAL_TABLET | ORAL | Status: DC

## 2016-01-11 NOTE — Discharge Instructions (Signed)

## 2016-01-11 NOTE — ED Notes (Signed)
Reports sore throat, cough, chills. No resp distress

## 2016-01-11 NOTE — ED Provider Notes (Signed)
[facility] Emergency Department Provider Note   Time seen: Approximately 1:04 PM  I have reviewed the triage vital signs and the nursing notes.   HISTORY  Chief Complaint Sore Throat    HPI Brandi Bright is a 45 y.o. female presents for evaluation of sore throat cough chills for 3-4 days. Patient states not getting better with over-the-counter medications. Patient denies fever at home. Has been taking Tylenol.   Past Medical History  Diagnosis Date  . Thyroid disease   . Arthritis   . MI, old   . Back pain     There are no active problems to display for this patient.   Past Surgical History  Procedure Laterality Date  . Cesarean section      Current Outpatient Rx  Name  Route  Sig  Dispense  Refill  . azithromycin (ZITHROMAX Z-PAK) 250 MG tablet      Take 2 tablets (500 mg) on  Day 1,  followed by 1 tablet (250 mg) once daily on Days 2 through 5.   6 each   0   . ibuprofen (ADVIL,MOTRIN) 800 MG tablet   Oral   Take 1 tablet (800 mg total) by mouth every 8 (eight) hours as needed.   30 tablet   0     Allergies Amoxicillin; Gabapentin; and Vicodin  History reviewed. No pertinent family history.  Social History Social History  Substance Use Topics  . Smoking status: Never Smoker   . Smokeless tobacco: None  . Alcohol Use: None    Review of Systems Constitutional: No fever/chills Eyes: No visual changes. ENT: Positive sore throat. Cardiovascular: Denies chest pain. Respiratory: Denies shortness of breath. Occasional cough. Gastrointestinal: No abdominal pain.  No nausea, no vomiting.  No diarrhea.  No constipation. Genitourinary: Negative for dysuria. Musculoskeletal: Negative for back pain. Skin: Negative for rash. Neurological: Negative for headaches, focal weakness or numbness.  10-point ROS otherwise negative.  ____________________________________________   PHYSICAL EXAM:  VITAL SIGNS: ED Triage Vitals  Enc  Vitals Group     BP 01/11/16 1207 132/99 mmHg     Pulse Rate 01/11/16 1207 74     Resp 01/11/16 1207 18     Temp 01/11/16 1207 98.2 F (36.8 C)     Temp Source 01/11/16 1207 Oral     SpO2 01/11/16 1207 98 %     Weight 01/11/16 1207 159 lb (72.122 kg)     Height 01/11/16 1207 5' (1.524 m)     Head Cir --      Peak Flow --      Pain Score 01/11/16 1207 7     Pain Loc --      Pain Edu? --      Excl. in [place]? --     Constitutional: Alert and oriented. Well appearing and in no acute distress. Eyes: Conjunctivae are normal. PERRL. EOMI. Head: Atraumatic. Nose: No congestion/rhinnorhea. Mouth/Throat: Mucous membranes are moist.  Oropharynx mildly erythematous. Neck: No stridor.  No cervical adenopathy noted. Cardiovascular: Normal rate, regular rhythm. Grossly normal heart sounds.  Good peripheral circulation. Respiratory: Normal respiratory effort.  No retractions. Lungs CTAB. Musculoskeletal: No lower extremity tenderness nor edema.  No joint effusions. Neurologic:  Normal speech and language. No gross focal neurologic deficits are appreciated. No gait instability. Skin:  Skin is warm, dry and intact. No rash noted. Psychiatric: Mood and affect are normal. Speech and behavior are normal.  ____________________________________________   LABS (all labs ordered are listed,  but only abnormal results are displayed)  Labs Reviewed  POCT RAPID STREP A     PROCEDURES  Procedure(s) performed: None  Critical Care performed: No  ____________________________________________   INITIAL IMPRESSION / ASSESSMENT AND PLAN / ED COURSE  Pertinent labs & imaging results that were available during my care of the patient were reviewed by me and considered in my medical decision making (see chart for details).  Acute pharyngitis/URI. Rx given for Zithromax and Motrin 800 mg. Patient to follow up with PCP next week as scheduled or return to the ER with any worsening symptomology.  Patient  voices no other emergency medical complaints at this time. ___________________________________________   FINAL CLINICAL IMPRESSION(S) / ED DIAGNOSES  Final diagnoses:  Acute pharyngitis, unspecified etiology     [name], PA-C 01/11/16 1420  Harvest Dark, MD 01/11/16 1426

## 2016-02-04 ENCOUNTER — Emergency Department
Admission: EM | Admit: 2016-02-04 | Discharge: 2016-02-04 | Disposition: A | Discharge status: Home / Self Care | Payer: Medicaid Other | Attending: Emergency Medicine | Admitting: Emergency Medicine

## 2016-02-04 ENCOUNTER — Emergency Department: Payer: Medicaid Other

## 2016-02-04 ENCOUNTER — Encounter: Payer: Self-pay | Admitting: Emergency Medicine

## 2016-02-04 DIAGNOSIS — I252 Old myocardial infarction: Secondary | ICD-10-CM | POA: Diagnosis not present

## 2016-02-04 DIAGNOSIS — R0781 Pleurodynia: Secondary | ICD-10-CM | POA: Diagnosis not present

## 2016-02-04 DIAGNOSIS — R0602 Shortness of breath: Secondary | ICD-10-CM | POA: Diagnosis not present

## 2016-02-04 DIAGNOSIS — R945 Abnormal results of liver function studies: Secondary | ICD-10-CM | POA: Insufficient documentation

## 2016-02-04 DIAGNOSIS — R1011 Right upper quadrant pain: Secondary | ICD-10-CM | POA: Diagnosis present

## 2016-02-04 DIAGNOSIS — R7989 Other specified abnormal findings of blood chemistry: Secondary | ICD-10-CM

## 2016-02-04 DIAGNOSIS — R0789 Other chest pain: Secondary | ICD-10-CM | POA: Diagnosis not present

## 2016-02-04 DIAGNOSIS — Z880 Allergy status to penicillin: Secondary | ICD-10-CM | POA: Insufficient documentation

## 2016-02-04 LAB — URINALYSIS COMPLETE WITH MICROSCOPIC (ARMC ONLY)
BACTERIA UA: NONE SEEN
BILIRUBIN URINE: NEGATIVE
GLUCOSE, UA: NEGATIVE mg/dL
HGB URINE DIPSTICK: NEGATIVE
KETONES UR: NEGATIVE mg/dL
LEUKOCYTES UA: NEGATIVE
NITRITE: NEGATIVE
Protein, ur: NEGATIVE mg/dL
SPECIFIC GRAVITY, URINE: 1.017 (ref 1.005–1.030)
pH: 7 (ref 5.0–8.0)

## 2016-02-04 LAB — CBC
HEMATOCRIT: 37.1 % (ref 35.0–47.0)
HEMOGLOBIN: 12.5 g/dL (ref 12.0–16.0)
MCH: 29.1 pg (ref 26.0–34.0)
MCHC: 33.8 g/dL (ref 32.0–36.0)
MCV: 86.2 fL (ref 80.0–100.0)
PLATELETS: 59 10*3/uL — AB (ref 150–440)
RBC: 4.31 MIL/uL (ref 3.80–5.20)
RDW: 14.3 % (ref 11.5–14.5)
WBC: 2.1 10*3/uL — AB (ref 3.6–11.0)

## 2016-02-04 LAB — COMPREHENSIVE METABOLIC PANEL
ALBUMIN: 3.8 g/dL (ref 3.5–5.0)
ALT: 116 U/L — ABNORMAL HIGH (ref 14–54)
ANION GAP: 7 (ref 5–15)
AST: 138 U/L — ABNORMAL HIGH (ref 15–41)
Alkaline Phosphatase: 91 U/L (ref 38–126)
BILIRUBIN TOTAL: 1 mg/dL (ref 0.3–1.2)
BUN: 11 mg/dL (ref 6–20)
CALCIUM: 9.8 mg/dL (ref 8.9–10.3)
CO2: 26 mmol/L (ref 22–32)
Chloride: 106 mmol/L (ref 101–111)
Creatinine, Ser: 0.52 mg/dL (ref 0.44–1.00)
GLUCOSE: 102 mg/dL — AB (ref 65–99)
POTASSIUM: 3.9 mmol/L (ref 3.5–5.1)
Sodium: 139 mmol/L (ref 135–145)
TOTAL PROTEIN: 7.7 g/dL (ref 6.5–8.1)

## 2016-02-04 LAB — TROPONIN I: Troponin I: 0.06 ng/mL — ABNORMAL HIGH (ref <0.031)

## 2016-02-04 LAB — LIPASE, BLOOD: Lipase: 37 U/L (ref 11–51)

## 2016-02-04 MED ORDER — CYCLOBENZAPRINE HCL 10 MG PO TABS
5.0000 mg | ORAL_TABLET | Freq: Once | ORAL | Status: AC
  Administered 2016-02-04: 5 mg via ORAL
  Filled 2016-02-04: qty 1

## 2016-02-04 MED ORDER — ONDANSETRON HCL 4 MG/2ML IJ SOLN
4.0000 mg | Freq: Once | INTRAMUSCULAR | Status: AC
  Administered 2016-02-04: 4 mg via INTRAVENOUS
  Filled 2016-02-04: qty 2

## 2016-02-04 MED ORDER — CYCLOBENZAPRINE HCL 5 MG PO TABS
5.0000 mg | ORAL_TABLET | Freq: Three times a day (TID) | ORAL | Status: AC | PRN
Start: 2016-02-04 — End: 2017-02-03

## 2016-02-04 MED ORDER — MORPHINE SULFATE (PF) 4 MG/ML IV SOLN
4.0000 mg | Freq: Once | INTRAVENOUS | Status: AC
Start: 2016-02-04 — End: 2016-02-04
  Administered 2016-02-04: 4 mg via INTRAVENOUS
  Filled 2016-02-04: qty 1

## 2016-02-04 MED ORDER — DIAZEPAM 5 MG/ML IJ SOLN: 5.0000 mg | Freq: Once | INTRAMUSCULAR | Status: DC

## 2016-02-04 MED ORDER — IBUPROFEN 800 MG PO TABS: 800.0000 mg | ORAL_TABLET | Freq: Three times a day (TID) | ORAL | Status: DC | PRN

## 2016-02-04 MED ORDER — OXYCODONE-ACETAMINOPHEN 5-325 MG PO TABS: 1.0000 | ORAL_TABLET | Freq: Four times a day (QID) | ORAL | Status: DC | PRN

## 2016-02-04 NOTE — ED Notes (Signed)
Received a call from chemistry at 1645 that pt had a Troponin of 0.06. Dr Mariea Clonts notified. Only one Troponin ordered and drawn per lab results. Pt called at home by this RN, pt states she cannot get back to the ER until tomorrow, denies cp at this time, Dr Mariea Clonts made aware.

## 2016-02-04 NOTE — ED Provider Notes (Addendum)
CSN: [id number]     Arrival date & time 02/04/16  1130 History   First MD Initiated Contact with Patient 02/04/16 1320     Chief Complaint  Patient presents with  . Abdominal Pain     (Consider location/radiation/quality/duration/timing/severity/associated sxs/prior Treatment) The history is provided by the patient.  Brandi Bright is a 45 y.o. female hx of MI, arthritis here with RUQ pain, chest pain, shortness of breath. Has been having intermittent RUQ pain and chest pain for the last 9 days. Worse with movement and worse with taking a deep breath. Denies leg swelling or recent travel. Denies nausea or post prandial pain. Denies fever or chills. Was diagnosed with pharyngitis a month ago but has no sore throat.     Past Medical History  Diagnosis Date  . Thyroid disease   . Arthritis   . MI, old   . Back pain    Past Surgical History  Procedure Laterality Date  . Cesarean section     History reviewed. No pertinent family history. Social History  Substance Use Topics  . Smoking status: Never Smoker   . Smokeless tobacco: None  . Alcohol Use: Yes     Comment: occasionally   OB History    No data available     Review of Systems  Cardiovascular: Positive for chest pain.  Gastrointestinal: Positive for abdominal pain.  All other systems reviewed and are negative.     Allergies  Amoxicillin; Gabapentin; and Vicodin  Home Medications   Prior to Admission medications   Medication Sig Start Date End Date Taking? Authorizing Provider  azithromycin (ZITHROMAX Z-PAK) 250 MG tablet Take 2 tablets (500 mg) on  Day 1,  followed by 1 tablet (250 mg) once daily on Days 2 through 5. 01/11/16   [name], PA-C  ibuprofen (ADVIL,MOTRIN) 800 MG tablet Take 1 tablet (800 mg total) by mouth every 8 (eight) hours as needed. 01/11/16   Pierce Crane Beers, PA-C   BP 125/91 mmHg  Pulse 65  Temp(Src) 97.8 F (36.6 C)  Resp 18  Ht 5' (1.524 m)  Wt 160 lb (72.576 kg)  BMI 31.25  kg/m2  SpO2 98% Physical Exam  Constitutional: She is oriented to person, place, and time.  Uncomfortable   HENT:  Head: Normocephalic.  Mouth/Throat: Oropharynx is clear and moist.  Eyes: Conjunctivae are normal. Pupils are equal, round, and reactive to light.  Neck: Normal range of motion. Neck supple.  Cardiovascular: Normal rate, regular rhythm and normal heart sounds.   Pulmonary/Chest: Effort normal and breath sounds normal. No respiratory distress. She has no wheezes.  Reproducible tenderness R lower ribs, no obvious deformity.   Abdominal: Soft. Bowel sounds are normal.  Mild RUQ tenderness, no murphy's.   Musculoskeletal: Normal range of motion. She exhibits no edema or tenderness.  Neurological: She is alert and oriented to person, place, and time.  Skin: Skin is warm and dry.  Psychiatric: She has a normal mood and affect. Her behavior is normal. Judgment and thought content normal.  Nursing note and vitals reviewed.   ED Course  Procedures (including critical care time) Labs Review Labs Reviewed  COMPREHENSIVE METABOLIC PANEL - Abnormal; Notable for the following:    Glucose, Bld 102 (*)    AST 138 (*)    ALT 116 (*)    All other components within normal limits  CBC - Abnormal; Notable for the following:    WBC 2.1 (*)    Platelets 59 (*)  All other components within normal limits  URINALYSIS COMPLETEWITH MICROSCOPIC ([facility] ONLY) - Abnormal; Notable for the following:    Color, Urine YELLOW (*)    APPearance CLEAR (*)    Squamous Epithelial / LPF 0-5 (*)    All other components within normal limits  LIPASE, BLOOD  TROPONIN I    Imaging Review Dg Chest 2 View  02/04/2016  CLINICAL DATA:  Right upper quadrant and right rib pain for 9 days, worsening the last 2 days. EXAM: CHEST  2 VIEW COMPARISON:  08/31/2013 FINDINGS: The heart size and mediastinal contours are within normal limits. Both lungs are clear. The visualized skeletal structures are unremarkable.  IMPRESSION: No active cardiopulmonary disease. Electronically Signed   By: Rolm Baptise M.D.   On: 02/04/2016 14:54   US Abdomen Limited Ruq  02/04/2016  CLINICAL DATA:  Right upper quadrant pain. EXAM: US ABDOMEN LIMITED - RIGHT UPPER QUADRANT COMPARISON:  CT 05/30/2013.  Ultrasound 03/28/2007. FINDINGS: Gallbladder: No gallstones or wall thickening visualized. No sonographic Murphy sign noted by sonographer. Common bile duct: Diameter: 3.0 mm Liver: No focal lesion identified. Within normal limits in parenchymal echogenicity. IMPRESSION: Normal exam. Electronically Signed   By: Marcello Moores  Register   On: 02/04/2016 14:46   I have personally reviewed and evaluated these images and lab results as part of my medical decision-making.   EKG Interpretation None      ED ECG REPORT I, Kayler Rise, the attending physician, personally viewed and interpreted this ECG.   Date: 02/04/2016  EKG Time: 11:44  Rate: 72  Rhythm: normal EKG, normal sinus rhythm  Axis: normal  Intervals:none  ST&T Change: nonspecific     MDM   Final diagnoses:  RUQ pain   Brandi Bright is a 45 y.o. female here with RUQ pain, chest pain. Afebrile, not tachycardic. Well appearing. Pain is reproducible on the chest and RUQ. Consider MSK vs chole. Pain on right side, I doubt ACS. EKG unchanged. Will get labs, CXR, UA, [place] RUQ.   3:01 PM CXR nl. US unremarkable. Pain improved. LFTs slightly elevated but was more elevated before. Never tachy or hypoxic or hypotensive. Will dc home with percocet, flexeril, motrin.    Wandra Arthurs, MD 02/04/16 Faith Aiyla Baucom, MD 02/04/16 [phone]

## 2016-02-04 NOTE — Discharge Instructions (Signed)
Take motrin for pain.   No heavy lifting a week.   Take flexeril for muscle spasms.  Take percocet for severe pain. Do NOT drive with it.   See your doctor./   Your liver function tests are slightly abnormal and can be rechecked in a week with your doctor.   Return to ER if you have severe abdominal pain, vomiting, chest pain, trouble breathing, shortness of breath.

## 2016-02-04 NOTE — ED Notes (Signed)
Pt presents with pain to her RUQ x 9 days that got worse in last 2 days. States she has had nausea, denies vomiting, fever, chills. Pt states pain is worse when she takes a deep breath or lies down.

## 2016-02-04 NOTE — ED Notes (Signed)
Pt to US at this time.

## 2016-02-04 NOTE — ED Notes (Signed)
Pt returned from [place]  Pt observed with no unusual behavior  Appropriate to stimulation  No verbalized needs or concerns at this time  NAD assessed  Continue to monitor

## 2017-08-22 ENCOUNTER — Emergency Department: Payer: Medicaid Other

## 2017-08-22 DIAGNOSIS — Y9301 Activity, walking, marching and hiking: Secondary | ICD-10-CM | POA: Insufficient documentation

## 2017-08-22 DIAGNOSIS — W108XXA Fall (on) (from) other stairs and steps, initial encounter: Secondary | ICD-10-CM | POA: Insufficient documentation

## 2017-08-22 DIAGNOSIS — Y999 Unspecified external cause status: Secondary | ICD-10-CM | POA: Insufficient documentation

## 2017-08-22 DIAGNOSIS — S40021A Contusion of right upper arm, initial encounter: Secondary | ICD-10-CM | POA: Insufficient documentation

## 2017-08-22 DIAGNOSIS — S0990XA Unspecified injury of head, initial encounter: Secondary | ICD-10-CM | POA: Diagnosis not present

## 2017-08-22 DIAGNOSIS — S59911A Unspecified injury of right forearm, initial encounter: Secondary | ICD-10-CM | POA: Diagnosis present

## 2017-08-22 DIAGNOSIS — Y92008 Other place in unspecified non-institutional (private) residence as the place of occurrence of the external cause: Secondary | ICD-10-CM | POA: Insufficient documentation

## 2017-08-22 NOTE — ED Triage Notes (Signed)
Pt states as she was getting out of the car this evening she tripped and fell landing on her rt side, pt states that she has sharp pains in her rt breast, pain in her rt arm, pt states sharp pain in her chest with burping also

## 2017-08-23 ENCOUNTER — Emergency Department: Payer: Medicaid Other

## 2017-08-23 ENCOUNTER — Emergency Department
Admission: EM | Admit: 2017-08-23 | Discharge: 2017-08-23 | Disposition: A | Discharge status: Home / Self Care | Payer: Medicaid Other | Attending: Emergency Medicine | Admitting: Emergency Medicine

## 2017-08-23 DIAGNOSIS — W19XXXA Unspecified fall, initial encounter: Secondary | ICD-10-CM

## 2017-08-23 DIAGNOSIS — T07XXXA Unspecified multiple injuries, initial encounter: Secondary | ICD-10-CM

## 2017-08-23 DIAGNOSIS — M79601 Pain in right arm: Secondary | ICD-10-CM

## 2017-08-23 MED ORDER — KETOROLAC TROMETHAMINE 60 MG/2ML IM SOLN
60.0000 mg | Freq: Once | INTRAMUSCULAR | Status: AC
  Administered 2017-08-23: 60 mg via INTRAMUSCULAR
  Filled 2017-08-23: qty 2

## 2017-08-23 MED ORDER — OXYCODONE-ACETAMINOPHEN 5-325 MG PO TABS
2.0000 | ORAL_TABLET | Freq: Once | ORAL | Status: AC
  Administered 2017-08-23: 2 via ORAL
  Filled 2017-08-23: qty 2

## 2017-08-23 MED ORDER — ETODOLAC 200 MG PO CAPS: 200.0000 mg | ORAL_CAPSULE | Freq: Three times a day (TID) | ORAL | 0 refills | Status: DC

## 2017-08-23 NOTE — ED Provider Notes (Signed)
[facility] Emergency Department Provider Note   ____________________________________________   First MD Initiated Contact with Patient 08/23/17 0425     (approximate)  I have reviewed the triage vital signs and the nursing notes.   HISTORY  Chief Complaint Fall    HPI Brandi Bright is a 46 y.o. female who comes into the hospital today after a fall. The patient states that she was going up her steps onto the porch and she tripped and fell onto her right side. She reports this to steps up to the porch so is only on 2 steps that she fell. The patient has some pain to her right breast and the top of her right arm. She states that the pain is mainly from her elbow to her fingertips. The patient didn't take any medication for pain prior to arrival. She rates her pain a 9 out of 10 in intensity. The patient did hit her head on the side but did not lose consciousness. She denies any neck pain. She has an abrasion to the right side of her head. The patient's here today for evaluation.   Past Medical History:  Diagnosis Date  . Arthritis   . Back pain   . MI, old   . Thyroid disease     There are no active problems to display for this patient.   Past Surgical History:  Procedure Laterality Date  . CESAREAN SECTION      Prior to Admission medications   Medication Sig Start Date End Date Taking? Authorizing Provider  azithromycin (ZITHROMAX Z-PAK) 250 MG tablet Take 2 tablets (500 mg) on  Day 1,  followed by 1 tablet (250 mg) once daily on Days 2 through 5. 01/11/16   Beers, Pierce Crane, PA-C  etodolac (LODINE) 200 MG capsule Take 1 capsule (200 mg total) by mouth every 8 (eight) hours. 08/23/17   Loney Hering, MD  ibuprofen (ADVIL,MOTRIN) 800 MG tablet Take 1 tablet (800 mg total) by mouth every 8 (eight) hours as needed. 02/04/16   Drenda Freeze, MD  oxyCODONE-acetaminophen (PERCOCET) 5-325 MG tablet Take 1 tablet by mouth every 6 (six) hours as  needed. 02/04/16   Drenda Freeze, MD    Allergies Amoxicillin; Gabapentin; and Vicodin [hydrocodone-acetaminophen]  No family history on file.  Social History Social History  Substance Use Topics  . Smoking status: Never Smoker  . Smokeless tobacco: Not on file  . Alcohol use Yes     Comment: occasionally    Review of Systems  Constitutional: No fever/chills Eyes: No visual changes. ENT: No sore throat. Cardiovascular: Denies chest pain. Respiratory: Denies shortness of breath. Gastrointestinal: No abdominal pain.  No nausea, no vomiting.  No diarrhea.  No constipation. Genitourinary: Negative for dysuria. Musculoskeletal: right arm pain Skin: right breast pain, head abrasion Neurological: Negative for headaches, focal weakness or numbness.   ____________________________________________   PHYSICAL EXAM:  VITAL SIGNS: ED Triage Vitals  Enc Vitals Group     BP 08/22/17 2254 (!) 142/84     Pulse Rate 08/22/17 2254 64     Resp 08/22/17 2254 16     Temp 08/22/17 2254 98.2 F (36.8 C)     Temp Source 08/22/17 2254 Oral     SpO2 08/22/17 2254 99 %     Weight 08/22/17 2255 187 lb (84.8 kg)     Height 08/22/17 2255 4\' 9"  (1.448 m)     Head Circumference --      Peak  Flow --      Pain Score 08/22/17 2324 8     Pain Loc --      Pain Edu? --      Excl. in [place]? --     Constitutional: Alert and oriented. Well appearing and in moderate distress. Eyes: Conjunctivae are normal. PERRL. EOMI. Head: Atraumatic. Nose: No congestion/rhinnorhea. Mouth/Throat: Mucous membranes are moist.  Oropharynx non-erythematous. Neck: No cervical spine tenderness to palpation. Cardiovascular: Normal rate, regular rhythm. Grossly normal heart sounds.  Good peripheral circulation. Respiratory: Normal respiratory effort.  No retractions. Lungs CTAB. Gastrointestinal: Soft and nontender. No distention. Positive bowel sounds. Musculoskeletal: mild soft tissue swelling mid forearm.  Right  sided breast pain Neurologic:  Normal speech and language.  Skin:  Skin is warm, dry and intact. Small abrasion to right temple.  Psychiatric: Mood and affect are normal.   ____________________________________________   LABS (all labs ordered are listed, but only abnormal results are displayed)  Labs Reviewed - No data to display ____________________________________________  EKG  none ____________________________________________  RADIOLOGY  Dg Ribs Unilateral W/chest Right  Result Date: 08/23/2017 CLINICAL DATA:  Trip and fall injury, landing on the right side. Pain with breathing. EXAM: RIGHT RIBS AND CHEST - 3+ VIEW COMPARISON:  Chest 02/04/2016 FINDINGS: Normal heart size and pulmonary vascularity. No focal airspace disease or consolidation in the lungs. No blunting of costophrenic angles. No pneumothorax. Mediastinal contours appear intact. Right ribs appear intact. No acute displaced fractures or focal bone lesions are identified. IMPRESSION: No evidence of active pulmonary disease.  Negative right ribs. Electronically Signed   By: Lucienne Capers M.D.   On: 08/23/2017 00:06   Dg Forearm Right  Result Date: 08/23/2017 CLINICAL DATA:  Trip and fall injury landing on the right side. Right arm pain. EXAM: RIGHT FOREARM - 2 VIEW COMPARISON:  None. FINDINGS: Old appearing deformity of the distal right radial metaphysis likely representing old fracture. No evidence of acute fracture or dislocation in the right radius or ulna. No focal bone lesion or bone destruction. Degenerative changes in the carpus. Soft tissues are unremarkable. IMPRESSION: No acute bony abnormalities. Electronically Signed   By: Lucienne Capers M.D.   On: 08/23/2017 00:08   Ct Head Wo Contrast  Result Date: 08/23/2017 CLINICAL DATA:  Tripped and fell getting out of car.  Chest pain. EXAM: CT HEAD WITHOUT CONTRAST CT CERVICAL SPINE WITHOUT CONTRAST TECHNIQUE: Multidetector CT imaging of the head and cervical spine  was performed following the standard protocol without intravenous contrast. Multiplanar CT image reconstructions of the cervical spine were also generated. COMPARISON:  None. Cervical spine radiograph September 04, 2013 and MRI of the head April 07, 2012 FINDINGS: CT HEAD FINDINGS BRAIN: No intraparenchymal hemorrhage, mass effect nor midline shift. The ventricles and sulci are normal. No acute large vascular territory infarcts. No abnormal extra-axial fluid collections. Basal cisterns are patent. VASCULAR: Unremarkable. SKULL/SOFT TISSUES: No skull fracture. No significant soft tissue swelling. Small RIGHT frontal sebaceous cyst present on prior MRI. ORBITS/SINUSES: The included ocular globes and orbital contents are normal.The mastoid aircells and included paranasal sinuses are well-aerated. OTHER: None. CT CERVICAL SPINE FINDINGS ALIGNMENT: Straightened lordosis. Vertebral bodies in alignment. SKULL BASE AND VERTEBRAE: Cervical vertebral bodies and posterior elements are intact. Intervertebral disc heights preserved. No destructive bony lesions. Mild Upper cervical facet arthropathy. C1-2 articulation maintained. SOFT TISSUES AND SPINAL CANAL: Normal. DISC LEVELS: No significant osseous canal stenosis or neural foraminal narrowing. UPPER CHEST: Lung apices are clear. OTHER: None. IMPRESSION: Negative  noncontrast CT HEAD. No acute cervical spine fracture or malalignment. Electronically Signed   By: Elon Alas M.D.   On: 08/23/2017 05:36   Ct Cervical Spine Wo Contrast  Result Date: 08/23/2017 CLINICAL DATA:  Tripped and fell getting out of car.  Chest pain. EXAM: CT HEAD WITHOUT CONTRAST CT CERVICAL SPINE WITHOUT CONTRAST TECHNIQUE: Multidetector CT imaging of the head and cervical spine was performed following the standard protocol without intravenous contrast. Multiplanar CT image reconstructions of the cervical spine were also generated. COMPARISON:  None. Cervical spine radiograph September 04, 2013  and MRI of the head April 07, 2012 FINDINGS: CT HEAD FINDINGS BRAIN: No intraparenchymal hemorrhage, mass effect nor midline shift. The ventricles and sulci are normal. No acute large vascular territory infarcts. No abnormal extra-axial fluid collections. Basal cisterns are patent. VASCULAR: Unremarkable. SKULL/SOFT TISSUES: No skull fracture. No significant soft tissue swelling. Small RIGHT frontal sebaceous cyst present on prior MRI. ORBITS/SINUSES: The included ocular globes and orbital contents are normal.The mastoid aircells and included paranasal sinuses are well-aerated. OTHER: None. CT CERVICAL SPINE FINDINGS ALIGNMENT: Straightened lordosis. Vertebral bodies in alignment. SKULL BASE AND VERTEBRAE: Cervical vertebral bodies and posterior elements are intact. Intervertebral disc heights preserved. No destructive bony lesions. Mild Upper cervical facet arthropathy. C1-2 articulation maintained. SOFT TISSUES AND SPINAL CANAL: Normal. DISC LEVELS: No significant osseous canal stenosis or neural foraminal narrowing. UPPER CHEST: Lung apices are clear. OTHER: None. IMPRESSION: Negative noncontrast CT HEAD. No acute cervical spine fracture or malalignment. Electronically Signed   By: Elon Alas M.D.   On: 08/23/2017 05:36   Dg Humerus Right  Result Date: 08/23/2017 CLINICAL DATA:  Trip and fall injury, landing on the right side. Right arm pain. EXAM: RIGHT HUMERUS - 2+ VIEW COMPARISON:  None. FINDINGS: There is no evidence of fracture or other focal bone lesions. Soft tissues are unremarkable. IMPRESSION: Negative. Electronically Signed   By: Lucienne Capers M.D.   On: 08/23/2017 00:07   Dg Hand Complete Right  Result Date: 08/23/2017 CLINICAL DATA:  Trip and fall injury, landing on the right side. Right arm pain. EXAM: RIGHT HAND - COMPLETE 3+ VIEW COMPARISON:  None. FINDINGS: Degenerative changes in the interphalangeal joints and radiocarpal joints. Suggestion of cystic change versus bone erosion  in the lunate. This could represent avascular necrosis or degenerative cyst. Old ununited ossicle at the proximal hamate. No evidence of acute fracture or dislocation in the right hand. Soft tissues are unremarkable. IMPRESSION: No acute bony abnormalities. Degenerative changes in the right hand and wrist. Erosive versus cystic changes involving the lunate bone. Consider avascular necrosis versus degenerative cyst. Electronically Signed   By: Lucienne Capers M.D.   On: 08/23/2017 00:09    ____________________________________________   PROCEDURES  Procedure(s) performed: please, see procedure note(s).  .Splint Application Date/Time: 9/48/5462 5:00 AM Performed by: Loney Hering Authorized by: Loney Hering   Consent:    Consent obtained:  Verbal   Consent given by:  Patient Pre-procedure details:    Sensation:  Normal Procedure details:    Laterality:  Right   Location:  Arm   Arm:  R lower arm   Cast type:  Short arm   Splint type:  Sugar tong   Supplies:  Ortho-Glass Post-procedure details:    Pain:  Unchanged   Sensation:  Normal   Patient tolerance of procedure:  Tolerated well, no immediate complications    Critical Care performed: No  ____________________________________________   INITIAL IMPRESSION / ASSESSMENT AND PLAN /  ED COURSE  Pertinent labs & imaging results that were available during my care of the patient were reviewed by me and considered in my medical decision making (see chart for details).  this is a 46 year old female who comes into the hospital today after a fall on a porch. The patient has some arm pain with a contusion. We did do some x-rays and CTs of her head and cervical spine and reacting is negative. Given the patient's pain though I will give her a shot of Toradol as well as some Percocet. I will place the patient in the splinted in a sling and have her follow-up with her primary care physician. The patient be discharged home. She  does have Percocet chronically at home.I will give the patient a prescription for etodolac.      ____________________________________________   FINAL CLINICAL IMPRESSION(S) / ED DIAGNOSES  Final diagnoses:  Fall, initial encounter  Multiple contusions  Pain of right upper extremity      NEW MEDICATIONS STARTED DURING THIS VISIT:  Discharge Medication List as of 08/23/2017  6:36 AM       Note:  This document was prepared using Dragon voice recognition software and may include unintentional dictation errors.    Loney Hering, MD 08/23/17 [phone]

## 2017-08-23 NOTE — ED Notes (Signed)
Pt states she tripped over possibly her shoe falling on right side. Pt has dark purple bruising to right temporal region and forehead. Pt with pain to mid right forearm, right breast and right ribs. Pt with cms intact to right fingers. Ice pack provided to pt at this time for right arm. Pt denies loc, but complains of dizziness.

## 2017-08-23 NOTE — Discharge Instructions (Signed)
Please follow up with your primary care physician.

## 2017-08-23 NOTE — ED Notes (Signed)
Pt states dizziness improved

## 2017-12-28 ENCOUNTER — Other Ambulatory Visit: Payer: Self-pay

## 2017-12-28 ENCOUNTER — Observation Stay
Admission: EM | Admit: 2017-12-28 | Discharge: 2017-12-29 | Disposition: A | Discharge status: Home / Self Care | Payer: Medicaid Other | Attending: Specialist | Admitting: Specialist

## 2017-12-28 ENCOUNTER — Emergency Department: Payer: Medicaid Other

## 2017-12-28 DIAGNOSIS — R110 Nausea: Secondary | ICD-10-CM | POA: Diagnosis not present

## 2017-12-28 DIAGNOSIS — R0789 Other chest pain: Secondary | ICD-10-CM | POA: Diagnosis not present

## 2017-12-28 DIAGNOSIS — R0602 Shortness of breath: Secondary | ICD-10-CM | POA: Diagnosis not present

## 2017-12-28 DIAGNOSIS — G2581 Restless legs syndrome: Secondary | ICD-10-CM | POA: Insufficient documentation

## 2017-12-28 DIAGNOSIS — I864 Gastric varices: Secondary | ICD-10-CM | POA: Diagnosis not present

## 2017-12-28 DIAGNOSIS — I10 Essential (primary) hypertension: Secondary | ICD-10-CM | POA: Diagnosis not present

## 2017-12-28 DIAGNOSIS — R079 Chest pain, unspecified: Secondary | ICD-10-CM | POA: Diagnosis not present

## 2017-12-28 DIAGNOSIS — I252 Old myocardial infarction: Secondary | ICD-10-CM | POA: Insufficient documentation

## 2017-12-28 DIAGNOSIS — Z880 Allergy status to penicillin: Secondary | ICD-10-CM | POA: Insufficient documentation

## 2017-12-28 DIAGNOSIS — K766 Portal hypertension: Secondary | ICD-10-CM | POA: Diagnosis not present

## 2017-12-28 DIAGNOSIS — Z79899 Other long term (current) drug therapy: Secondary | ICD-10-CM | POA: Diagnosis not present

## 2017-12-28 DIAGNOSIS — Z885 Allergy status to narcotic agent status: Secondary | ICD-10-CM | POA: Insufficient documentation

## 2017-12-28 DIAGNOSIS — D696 Thrombocytopenia, unspecified: Secondary | ICD-10-CM | POA: Diagnosis not present

## 2017-12-28 DIAGNOSIS — I2510 Atherosclerotic heart disease of native coronary artery without angina pectoris: Secondary | ICD-10-CM | POA: Diagnosis not present

## 2017-12-28 DIAGNOSIS — D61818 Other pancytopenia: Secondary | ICD-10-CM | POA: Diagnosis not present

## 2017-12-28 DIAGNOSIS — I8500 Esophageal varices without bleeding: Secondary | ICD-10-CM | POA: Diagnosis not present

## 2017-12-28 DIAGNOSIS — Z8249 Family history of ischemic heart disease and other diseases of the circulatory system: Secondary | ICD-10-CM | POA: Diagnosis not present

## 2017-12-28 DIAGNOSIS — K7460 Unspecified cirrhosis of liver: Secondary | ICD-10-CM | POA: Insufficient documentation

## 2017-12-28 DIAGNOSIS — I700 Atherosclerosis of aorta: Secondary | ICD-10-CM | POA: Diagnosis not present

## 2017-12-28 DIAGNOSIS — E039 Hypothyroidism, unspecified: Secondary | ICD-10-CM | POA: Insufficient documentation

## 2017-12-28 DIAGNOSIS — R05 Cough: Secondary | ICD-10-CM | POA: Diagnosis not present

## 2017-12-28 LAB — CBC WITH DIFFERENTIAL/PLATELET
Basophils Absolute: 0 10*3/uL (ref 0–0.1)
Basophils Relative: 1 %
EOS ABS: 0 10*3/uL (ref 0–0.7)
EOS PCT: 2 %
HCT: 34.7 % — ABNORMAL LOW (ref 35.0–47.0)
Hemoglobin: 11.6 g/dL — ABNORMAL LOW (ref 12.0–16.0)
LYMPHS ABS: 0.7 10*3/uL — AB (ref 1.0–3.6)
Lymphocytes Relative: 33 %
MCH: 28.8 pg (ref 26.0–34.0)
MCHC: 33.3 g/dL (ref 32.0–36.0)
MCV: 86.4 fL (ref 80.0–100.0)
MONO ABS: 0.2 10*3/uL (ref 0.2–0.9)
MONOS PCT: 9 %
Neutro Abs: 1.2 10*3/uL — ABNORMAL LOW (ref 1.4–6.5)
Neutrophils Relative %: 55 %
PLATELETS: 56 10*3/uL — AB (ref 150–440)
RBC: 4.02 MIL/uL (ref 3.80–5.20)
RDW: 15.1 % — AB (ref 11.5–14.5)
WBC: 2.1 10*3/uL — ABNORMAL LOW (ref 3.6–11.0)

## 2017-12-28 LAB — COMPREHENSIVE METABOLIC PANEL
ALBUMIN: 3.4 g/dL — AB (ref 3.5–5.0)
ALK PHOS: 114 U/L (ref 38–126)
ALT: 138 U/L — AB (ref 14–54)
AST: 219 U/L — ABNORMAL HIGH (ref 15–41)
Anion gap: 7 (ref 5–15)
BILIRUBIN TOTAL: 1 mg/dL (ref 0.3–1.2)
BUN: 8 mg/dL (ref 6–20)
CALCIUM: 8.6 mg/dL — AB (ref 8.9–10.3)
CO2: 23 mmol/L (ref 22–32)
CREATININE: 0.58 mg/dL (ref 0.44–1.00)
Chloride: 110 mmol/L (ref 101–111)
GFR calc Af Amer: >60 mL/min (ref >60)
GFR calc non Af Amer: >60 mL/min (ref >60)
GLUCOSE: 117 mg/dL — AB (ref 65–99)
Potassium: 3.3 mmol/L — ABNORMAL LOW (ref 3.5–5.1)
Sodium: 140 mmol/L (ref 135–145)
Total Protein: 6.9 g/dL (ref 6.5–8.1)

## 2017-12-28 LAB — FIBRIN DERIVATIVES D-DIMER (ARMC ONLY): FIBRIN DERIVATIVES D-DIMER (ARMC): 178.28 ng{FEU}/mL (ref 0.00–499.00)

## 2017-12-28 LAB — TROPONIN I: Troponin I: <0.03 ng/mL (ref <0.03)

## 2017-12-28 NOTE — ED Provider Notes (Signed)
[facility] Emergency Department Provider Note   ____________________________________________   First MD Initiated Contact with Patient 12/28/17 2144     (approximate)  I have reviewed the triage vital signs and the nursing notes.   HISTORY  Chief Complaint Chest Pain   HPI Brandi Bright is a 47 y.o. female Who reports she developed chest pain today was tight and now it stabbing with some shortness of breath nausea and some sweating. It feels something like she had when she had her to mild heart attack but this is worse. She also has a cough that is producing some small amounts of clear phlegm. She took 2 nitroglycerin prior to EMS arrival first one did nothing the second one she told me may her feel better. She says she's had diarrhea all day when she talked to the nurse but did not tell me that.   Past Medical History:  Diagnosis Date  . Arthritis   . Back pain   . MI, old   . Thyroid disease     There are no active problems to display for this patient.   Past Surgical History:  Procedure Laterality Date  . CESAREAN SECTION      Prior to Admission medications   Medication Sig Start Date End Date Taking? Authorizing Provider  azithromycin (ZITHROMAX Z-PAK) 250 MG tablet Take 2 tablets (500 mg) on  Day 1,  followed by 1 tablet (250 mg) once daily on Days 2 through 5. 01/11/16   Beers, Pierce [place], PA-C  etodolac (LODINE) 200 MG capsule Take 1 capsule (200 mg total) by mouth every 8 (eight) hours. 08/23/17   Loney Hering, MD  ibuprofen (ADVIL,MOTRIN) 800 MG tablet Take 1 tablet (800 mg total) by mouth every 8 (eight) hours as needed. 02/04/16   Drenda Freeze, MD  oxyCODONE-acetaminophen (PERCOCET) 5-325 MG tablet Take 1 tablet by mouth every 6 (six) hours as needed. 02/04/16   Drenda Freeze, MD    Allergies Amoxicillin; Gabapentin; and Vicodin [hydrocodone-acetaminophen]  History reviewed. No pertinent family history.  Social  History Social History   Tobacco Use  . Smoking status: Never Smoker  . Smokeless tobacco: Never Used  Substance Use Topics  . Alcohol use: Yes    Comment: occasionally  . Drug use: Not on file    Review of Systems  Constitutional: No fever/chills Eyes: No visual changes. ENT: No sore throat. Cardiovascular: see history of present illness Respiratory:  shortness of breath. Gastrointestinal: see history of present illness Genitourinary: Negative for dysuria. Musculoskeletal: Negative for back pain. Skin: Negative for rash. Neurological: Negative for headaches, focal weakness   ____________________________________________   PHYSICAL EXAM:  VITAL SIGNS: ED Triage Vitals  Enc Vitals Group     BP 12/28/17 2133 (!) 142/88     Pulse Rate 12/28/17 2133 87     Resp 12/28/17 2133 20     Temp 12/28/17 2133 98.3 F (36.8 C)     Temp Source 12/28/17 2133 Oral     SpO2 12/28/17 2133 97 %     Weight 12/28/17 2134 162 lb (73.5 kg)     Height 12/28/17 2134 4\' 11"  (1.499 m)     Head Circumference --      Peak Flow --      Pain Score 12/28/17 2133 9     Pain Loc --      Pain Edu? --      Excl. in [place]? --     Constitutional:  Alert and oriented.looks nervous Eyes: Conjunctivae are normal. I. Head: Atraumatic. Nose: No congestion/rhinnorhea. Mouth/Throat: Mucous membranes are moist.  Oropharynx non-erythematous. Neck: No stridor.   Cardiovascular: Normal rate, regular rhythm. Grossly normal heart sounds.  Good peripheral circulation. Respiratory: Normal respiratory effort.  No retractions. Lungs CTAB. Gastrointestinal: Soft and nontender. No distention. No abdominal bruits. No CVA tenderness. Musculoskeletal: No lower extremity tenderness nor edema.  No joint effusions. Neurologic:  Normal speech and language. No gross focal neurologic deficits are appreciated.  Skin:  Skin is warm, dry and intact. No rash noted. Psychiatric: Mood and affect are normal. Speech and behavior are  normal.  ____________________________________________   LABS (all labs ordered are listed, but only abnormal results are displayed)  Labs Reviewed  COMPREHENSIVE METABOLIC PANEL - Abnormal; Notable for the following components:      Result Value   Potassium 3.3 (*)    Glucose, Bld 117 (*)    Calcium 8.6 (*)    Albumin 3.4 (*)    AST 219 (*)    ALT 138 (*)    All other components within normal limits  CBC WITH DIFFERENTIAL/PLATELET - Abnormal; Notable for the following components:   WBC 2.1 (*)    Hemoglobin 11.6 (*)    HCT 34.7 (*)    RDW 15.1 (*)    Platelets 56 (*)    Neutro Abs 1.2 (*)    Lymphs Abs 0.7 (*)    All other components within normal limits  TROPONIN I  FIBRIN DERIVATIVES D-DIMER ([facility] ONLY)  PREGNANCY, URINE  URINALYSIS, COMPLETE (UACMP) WITH MICROSCOPIC  BRAIN NATRIURETIC PEPTIDE  TROPONIN I   ____________________________________________  EKG  EKG shows limb lead reversal we'll repeat it second EKG looks very similar to one from February 2017. It is red and interpreted by me shows sinus tachycardia rate of 106 normal axis and low height of the QRS in the chest leads no acute ST-T wave changes QTC is 490 ms by the computer ____________________________________________  RADIOLOGY  asked x-ray is read as no acute disease ____________________________________________   PROCEDURES  Procedure(s) performed:   Procedures  Critical Care performed:  ____________________________________________   INITIAL IMPRESSION / ASSESSMENT AND PLAN / ED COURSE  initial troponin is negative EKG does not look different than previously. I will repeat the troponin sign the patient out to Dr. round. He will monitor the patient to the troponin gets back anticipate being able to discharge her.patient's white count has been about as low as it is now in the past.     ____________________________________________   FINAL CLINICAL IMPRESSION(S) / ED DIAGNOSES  Final  diagnoses:  Chest pain, unspecified type     ED Discharge Orders    None       Note:  This document was prepared using Dragon voice recognition software and may include unintentional dictation errors.    Nena Polio, MD 12/28/17 [phone]

## 2017-12-28 NOTE — ED Triage Notes (Signed)
Pt comes via [facility] from home after son called 80 with c/o continued chest pain. Pt took nitro X2 prior to EMS arrival with no change. Pt states diarrhea all day, not feeling well for last couple of weeks. Pt states no appetite. BP 178/98, Hr-115, EMS gave 342 mg aspirin. Per EMS EKG with elevation on depression.

## 2017-12-29 ENCOUNTER — Other Ambulatory Visit: Payer: Self-pay

## 2017-12-29 ENCOUNTER — Emergency Department: Payer: Medicaid Other

## 2017-12-29 ENCOUNTER — Observation Stay: Payer: Medicaid Other

## 2017-12-29 DIAGNOSIS — R0789 Other chest pain: Secondary | ICD-10-CM | POA: Diagnosis present

## 2017-12-29 DIAGNOSIS — R079 Chest pain, unspecified: Secondary | ICD-10-CM | POA: Diagnosis present

## 2017-12-29 LAB — NM MYOCAR MULTI W/SPECT W/WALL MOTION / EF
CHL CUP NUCLEAR SDS: 1
CHL CUP NUCLEAR SRS: 8
CHL CUP NUCLEAR SSS: 7
CSEPEW: 1 METS
Exercise duration (min): 1 min
Exercise duration (sec): 25 s
LVDIAVOL: 76 mL (ref 46–106)
LVSYSVOL: 19 mL
NUC STRESS TID: 1.04
Peak HR: 101 {beats}/min
Rest HR: 75 {beats}/min

## 2017-12-29 LAB — HEMOGLOBIN A1C
Hgb A1c MFr Bld: 4.9 % (ref 4.8–5.6)
Mean Plasma Glucose: 93.93 mg/dL

## 2017-12-29 LAB — BRAIN NATRIURETIC PEPTIDE: B Natriuretic Peptide: 15 pg/mL (ref 0.0–100.0)

## 2017-12-29 LAB — TROPONIN I
Troponin I: <0.03 ng/mL (ref <0.03)
Troponin I: <0.03 ng/mL (ref <0.03)
Troponin I: <0.03 ng/mL (ref <0.03)

## 2017-12-29 LAB — TSH: TSH: 2.973 u[IU]/mL (ref 0.350–4.500)

## 2017-12-29 LAB — CHOLESTEROL, TOTAL: CHOLESTEROL: 107 mg/dL (ref 0–200)

## 2017-12-29 LAB — LIPASE, BLOOD: Lipase: 71 U/L — ABNORMAL HIGH (ref 11–51)

## 2017-12-29 MED ORDER — MORPHINE SULFATE (PF) 2 MG/ML IV SOLN: 2.0000 mg | INTRAVENOUS | Status: DC | PRN

## 2017-12-29 MED ORDER — TECHNETIUM TC 99M TETROFOSMIN IV KIT
29.5530 | PACK | Freq: Once | INTRAVENOUS | Status: AC | PRN
  Administered 2017-12-29: 29.553 via INTRAVENOUS

## 2017-12-29 MED ORDER — REGADENOSON 0.4 MG/5ML IV SOLN
0.4000 mg | Freq: Once | INTRAVENOUS | Status: AC
  Administered 2017-12-29: 0.4 mg via INTRAVENOUS

## 2017-12-29 MED ORDER — LORAZEPAM 2 MG/ML IJ SOLN
0.5000 mg | Freq: Once | INTRAMUSCULAR | Status: AC
  Administered 2017-12-29: 0.5 mg via INTRAVENOUS

## 2017-12-29 MED ORDER — IOPAMIDOL (ISOVUE-300) INJECTION 61%
30.0000 mL | Freq: Once | INTRAVENOUS | Status: AC | PRN
  Administered 2017-12-29: 30 mL via ORAL

## 2017-12-29 MED ORDER — POTASSIUM CHLORIDE CRYS ER 20 MEQ PO TBCR
40.0000 meq | EXTENDED_RELEASE_TABLET | ORAL | Status: AC
  Administered 2017-12-29: 40 meq via ORAL
  Filled 2017-12-29: qty 2

## 2017-12-29 MED ORDER — LORAZEPAM 2 MG/ML IJ SOLN
INTRAMUSCULAR | Status: AC
  Administered 2017-12-29: 0.5 mg via INTRAVENOUS
  Filled 2017-12-29: qty 1

## 2017-12-29 MED ORDER — ONDANSETRON HCL 4 MG/2ML IJ SOLN: 4.0000 mg | Freq: Four times a day (QID) | INTRAMUSCULAR | Status: DC | PRN

## 2017-12-29 MED ORDER — IOPAMIDOL (ISOVUE-370) INJECTION 76%
100.0000 mL | Freq: Once | INTRAVENOUS | Status: AC | PRN
  Administered 2017-12-29: 100 mL via INTRAVENOUS

## 2017-12-29 MED ORDER — DOCUSATE SODIUM 100 MG PO CAPS
100.0000 mg | ORAL_CAPSULE | Freq: Two times a day (BID) | ORAL | Status: DC
  Filled 2017-12-29: qty 1

## 2017-12-29 MED ORDER — LEVOTHYROXINE SODIUM 100 MCG PO TABS
200.0000 ug | ORAL_TABLET | Freq: Every day | ORAL | Status: DC
  Administered 2017-12-29: 200 ug via ORAL
  Filled 2017-12-29: qty 2

## 2017-12-29 MED ORDER — ROPINIROLE HCL 1 MG PO TABS: 2.0000 mg | ORAL_TABLET | Freq: Every day | ORAL | Status: DC

## 2017-12-29 MED ORDER — ONDANSETRON HCL 4 MG PO TABS: 4.0000 mg | ORAL_TABLET | Freq: Four times a day (QID) | ORAL | Status: DC | PRN

## 2017-12-29 MED ORDER — ACETAMINOPHEN 325 MG PO TABS
650.0000 mg | ORAL_TABLET | Freq: Once | ORAL | Status: AC
  Administered 2017-12-29: 650 mg via ORAL

## 2017-12-29 MED ORDER — CARVEDILOL 3.125 MG PO TABS
3.1250 mg | ORAL_TABLET | Freq: Every day | ORAL | Status: DC
  Administered 2017-12-29: 3.125 mg via ORAL
  Filled 2017-12-29: qty 1

## 2017-12-29 MED ORDER — SODIUM CHLORIDE 0.9 % IV SOLN
INTRAVENOUS | Status: DC
  Administered 2017-12-29: 06:00:00 via INTRAVENOUS

## 2017-12-29 MED ORDER — OXYCODONE-ACETAMINOPHEN 5-325 MG PO TABS: 1.0000 | ORAL_TABLET | Freq: Four times a day (QID) | ORAL | Status: DC | PRN

## 2017-12-29 MED ORDER — ACETAMINOPHEN 325 MG PO TABS
650.0000 mg | ORAL_TABLET | Freq: Four times a day (QID) | ORAL | Status: DC | PRN
Start: 2017-12-29 — End: 2017-12-29

## 2017-12-29 MED ORDER — ACETAMINOPHEN 650 MG RE SUPP: 650.0000 mg | Freq: Four times a day (QID) | RECTAL | Status: DC | PRN

## 2017-12-29 MED ORDER — ACETAMINOPHEN 325 MG PO TABS
ORAL_TABLET | ORAL | Status: AC
  Filled 2017-12-29: qty 2

## 2017-12-29 MED ORDER — NITROGLYCERIN 0.4 MG SL SUBL: 0.4000 mg | SUBLINGUAL_TABLET | SUBLINGUAL | Status: DC | PRN

## 2017-12-29 MED ORDER — TECHNETIUM TC 99M TETROFOSMIN IV KIT
12.9490 | PACK | Freq: Once | INTRAVENOUS | Status: AC | PRN
  Administered 2017-12-29: 12.949 via INTRAVENOUS

## 2017-12-29 NOTE — Care Management (Signed)
Placed in observation for chest pain.  Negative troponins and anticipate d/c if stress is negative

## 2017-12-29 NOTE — Progress Notes (Signed)
Discharge instructions given. IV taken out, taken off monitor. Patient verbalized understanding with no further questions. Patient awaiting for family member to bring her clothes. Will continue to monitor until then.

## 2017-12-29 NOTE — H&P (Addendum)
Brandi Bright is an 47 y.o. female.   Chief Complaint: Chest pain HPI: The patient with past medical history of CAD and MI presents to the emergency department complaining of chest pain.  Chest pain began at rest and was substernal.  It was pressure-like in quality.  She denies short of breath but admits to some nausea at the time.  Eventually the pain seems to migrate into her abdomen.  The patient states that she had a cath 5 years ago that showed coronary artery plaque burden but did not warrant a stent at the time.  For evaluation revealed mild pancreatitis as well as transaminitis.  Troponins were negative but due to the patient's comorbidities emergency department staff called the hospitalist service for admission.  Past Medical History:  Diagnosis Date  . Arthritis   . Back pain   . MI, old   . Thyroid disease     Past Surgical History:  Procedure Laterality Date  . CESAREAN SECTION      Family History  Problem Relation Age of Onset  . Hypertension Other   . CAD Father    Social History:  reports that  has never smoked. she has never used smokeless tobacco. She reports that she drinks alcohol. Her drug history is not on file.  Allergies:  Allergies  Allergen Reactions  . Amoxicillin Rash and Other (See Comments)    Has patient had a PCN reaction causing immediate rash, facial/tongue/throat swelling, SOB or lightheadedness with hypotension: Yes Has patient had a PCN reaction causing severe rash involving mucus membranes or skin necrosis: No Has patient had a PCN reaction that required hospitalization: No Has patient had a PCN reaction occurring within the last 10 years: No If all of the above answers are "NO", then may proceed with Cephalosporin use.   . Gabapentin Rash  . Vicodin [Hydrocodone-Acetaminophen] Rash    Medications Prior to Admission  Medication Sig Dispense Refill  . carvedilol (COREG) 3.125 MG tablet Take 3.125 mg by mouth daily.    Marland Kitchen levothyroxine  (SYNTHROID, LEVOTHROID) 200 MCG tablet Take 200 mcg by mouth daily.  8  . nitroGLYCERIN (NITROSTAT) 0.4 MG SL tablet Place 0.4 mg under the tongue every 5 (five) minutes x 3 doses as needed for chest pain.    Marland Kitchen oxyCODONE-acetaminophen (PERCOCET) 5-325 MG tablet Take 1 tablet by mouth every 6 (six) hours as needed. (Patient taking differently: Take 1 tablet by mouth 3 (three) times daily. ) 8 tablet 0  . rOPINIRole (REQUIP) 2 MG tablet Take 2 mg by mouth at bedtime.      Results for orders placed or performed during the hospital encounter of 12/28/17 (from the past 48 hour(s))  Comprehensive metabolic panel     Status: Abnormal   Collection Time: 12/28/17  9:46 PM  Result Value Ref Range   Sodium 140 135 - 145 mmol/L   Potassium 3.3 (L) 3.5 - 5.1 mmol/L   Chloride 110 101 - 111 mmol/L   CO2 23 22 - 32 mmol/L   Glucose, Bld 117 (H) 65 - 99 mg/dL   BUN 8 6 - 20 mg/dL   Creatinine, Ser 0.58 0.44 - 1.00 mg/dL   Calcium 8.6 (L) 8.9 - 10.3 mg/dL   Total Protein 6.9 6.5 - 8.1 g/dL   Albumin 3.4 (L) 3.5 - 5.0 g/dL   AST 219 (H) 15 - 41 U/L   ALT 138 (H) 14 - 54 U/L   Alkaline Phosphatase 114 38 - 126 U/L  Total Bilirubin 1.0 0.3 - 1.2 mg/dL   GFR calc non Af Amer >60 >60 mL/min   GFR calc Af Amer >60 >60 mL/min    Comment: (NOTE) The eGFR has been calculated using the CKD EPI equation. This calculation has not been validated in all clinical situations. eGFR's persistently <60 mL/min signify possible Chronic Kidney Disease.    Anion gap 7 5 - 15    Comment: Performed at [facility], [address] [place], [place] [address]  Troponin I     Status: None   Collection Time: 12/28/17  9:46 PM  Result Value Ref Range   Troponin I <0.03 <0.03 ng/mL    Comment: Performed at [facility], [address] [place], [place] [address]  CBC with Differential     Status: Abnormal   Collection Time: 12/28/17  9:46 PM  Result Value Ref Range   WBC 2.1 (L) 3.6 - 11.0 K/uL   RBC  4.02 3.80 - 5.20 MIL/uL   Hemoglobin 11.6 (L) 12.0 - 16.0 g/dL   HCT 34.7 (L) 35.0 - 47.0 %   MCV 86.4 80.0 - 100.0 fL   MCH 28.8 26.0 - 34.0 pg   MCHC 33.3 32.0 - 36.0 g/dL   RDW 15.1 (H) 11.5 - 14.5 %   Platelets 56 (L) 150 - 440 K/uL   Neutrophils Relative % 55 %   Neutro Abs 1.2 (L) 1.4 - 6.5 K/uL   Lymphocytes Relative 33 %   Lymphs Abs 0.7 (L) 1.0 - 3.6 K/uL   Monocytes Relative 9 %   Monocytes Absolute 0.2 0.2 - 0.9 K/uL   Eosinophils Relative 2 %   Eosinophils Absolute 0.0 0 - 0.7 K/uL   Basophils Relative 1 %   Basophils Absolute 0.0 0 - 0.1 K/uL    Comment: Performed at [facility], [address] [place], [place] [address]  Brain natriuretic peptide     Status: None   Collection Time: 12/28/17  9:46 PM  Result Value Ref Range   B Natriuretic Peptide 15.0 0.0 - 100.0 pg/mL    Comment: Performed at [facility], [address] [place], [place] [address]  Fibrin derivatives D-Dimer [facility] only)     Status: None   Collection Time: 12/28/17 10:07 PM  Result Value Ref Range   Fibrin derivatives D-dimer ([facility]) 178.28 0.00 - 499.00 ng/mL (FEU)    Comment: (NOTE) <> Exclusion of Venous Thromboembolism (VTE) - OUTPATIENT ONLY   (Emergency Department or [place])   0-499 ng/ml (FEU): With a low to intermediate pretest probability                      for VTE this test result excludes the diagnosis                      of VTE.   >499 ng/ml (FEU) : VTE not excluded; additional work up for VTE is                      required. <> Testing on Inpatients and Evaluation of Disseminated Intravascular   Coagulation (DIC) Reference Range:   0-499 ng/ml (FEU) Performed at [facility], [address] [place], [place] [address]   Troponin I     Status: None   Collection Time: 12/28/17 11:52 PM  Result Value Ref Range   Troponin I <0.03 <0.03 ng/mL    Comment: Performed at [facility], [place], [place] [address]  Lipase, blood      Status: Abnormal   Collection Time: 12/28/17 11:52 PM  Result Value Ref Range   Lipase 71 (H) 11 - 51 U/L    Comment: Performed at [facility], [address] [place], [place] [address]   Ct Abdomen Pelvis W Contrast  Result Date: 12/29/2017 CLINICAL DATA:  47 year old female with abdominal pain. EXAM: CT ABDOMEN AND PELVIS WITH CONTRAST TECHNIQUE: Multidetector CT imaging of the abdomen and pelvis was performed using the standard protocol following bolus administration of intravenous contrast. CONTRAST:  133m ISOVUE-370 IOPAMIDOL (ISOVUE-370) INJECTION 76% COMPARISON:  Abdominal ultrasound dated 02/04/2016 FINDINGS: Lower chest: The visualized lung bases are clear. No intra-free air or free fluid. Hepatobiliary: Cirrhosis. No intrahepatic biliary ductal dilatation. The gallbladder is contracted. Pancreas: Unremarkable. No pancreatic ductal dilatation or surrounding inflammatory changes. Spleen: The spleen is enlarged measuring approximately 19 cm in craniocaudal length. Adrenals/Urinary Tract: The adrenal glands are unremarkable. Subcentimeter right renal hypodense lesion is too small to characterize but likely represents a cyst. There is no hydronephrosis on either side. There is symmetric enhancement and excretion of contrast by both kidneys. The visualized ureters and urinary bladder appear unremarkable. Stomach/Bowel: There is no bowel obstruction or active inflammation. Normal appendix. Small amount of contrast and air in the distal esophagus likely related to reflux. Vascular/Lymphatic: Minimal atherosclerotic calcification of the abdominal aorta. The aorta and IVC are otherwise unremarkable. The SMV, splenic vein, and main portal vein are patent. There is recanalization of the paraumbilical vein. Upper abdominal varices including esophageal and gastric varices noted. No adenopathy. Reproductive: The uterus and ovaries are grossly unremarkable. Other: None Musculoskeletal: Mild  chronic compression of the lower thoracic spine. No acute osseous pathology. IMPRESSION: 1. No acute intra-abdominal or pelvic pathology. No bowel obstruction or active inflammation. Normal appendix. 2. Cirrhosis with portal hypertension, abdominal varices, and splenomegaly. Electronically Signed   By: AAnner CreteM.D.   On: 12/29/2017 03:39   Dg Chest Portable 1 View  Result Date: 12/28/2017 CLINICAL DATA:  Chest pain, diarrhea and weakness. EXAM: PORTABLE CHEST 1 VIEW COMPARISON:  Chest radiograph 08/22/2017. FINDINGS: The heart size and mediastinal contours are within normal limits. Both lungs are clear. The visualized skeletal structures are unremarkable. IMPRESSION: No active disease. Electronically Signed   By: KUlyses JarredM.D.   On: 12/28/2017 22:13    Review of Systems  Constitutional: Negative for chills and fever.  HENT: Negative for sore throat and tinnitus.   Eyes: Negative for blurred vision and redness.  Respiratory: Negative for cough and shortness of breath.   Cardiovascular: Positive for chest pain (resolved). Negative for palpitations, orthopnea and PND.  Gastrointestinal: Negative for abdominal pain, diarrhea, nausea and vomiting.  Genitourinary: Negative for dysuria, frequency and urgency.  Musculoskeletal: Negative for joint pain and myalgias.  Skin: Negative for rash.       No lesions  Neurological: Negative for speech change, focal weakness and weakness.  Endo/Heme/Allergies: Does not bruise/bleed easily.       No temperature intolerance  Psychiatric/Behavioral: Negative for depression and suicidal ideas.    Blood pressure 112/79, pulse 68, temperature 97.9 F (36.6 C), temperature source Oral, resp. rate 18, height 4' 11"  (1.499 m), weight 87.6 kg (193 lb 3.2 oz), SpO2 97 %. Physical Exam  Vitals reviewed. Constitutional: She is oriented to person, place, and time. She appears well-developed and well-nourished. No distress.  HENT:  Head: Normocephalic and  atraumatic.  Mouth/Throat: Oropharynx is clear and moist.  Eyes: Conjunctivae and EOM are normal. Pupils are equal, round, and reactive to light. No scleral icterus.  Neck: Normal range of motion. Neck supple. No JVD present. No tracheal deviation present. No thyromegaly present.  Cardiovascular: Normal rate, regular rhythm and normal heart sounds. Exam reveals no gallop and no friction rub.  No murmur heard. Respiratory: Effort normal and breath sounds normal.  GI: Soft. Bowel sounds are normal. She exhibits no distension. There is no tenderness.  Genitourinary:  Genitourinary Comments: Deferred  Lymphadenopathy:    She has no cervical adenopathy.  Neurological: She is alert and oriented to person, place, and time. No cranial nerve deficit. She exhibits normal muscle tone.  Skin: Skin is warm and dry. No rash noted. No erythema.  Psychiatric: She has a normal mood and affect. Her behavior is normal. Judgment and thought content normal.     Assessment/Plan This is a 47 year old female admitted for chest pain. 1.  Chest pain: Troponins negative so far; EKG shows no indication of myocardial ischemia.  Nonetheless, follow cardiac biomarkers.  Monitor telemetry.  Consult cardiology.  Nitroglycerin for pain 2.  CAD: Start aspirin.  Continue Coreg (?) 3.  Pancreatitis: Clear liquid diet for now.  Hydrate with intravenous fluid.  IV morphine for severe pain. 4.  Transaminitis: Gradually increasing.  Likely viral.  Check acute hepatitis panel.  Consult gastroenterology if necessary. 5.  Thrombocytopenia: No active bleeding.  With the above findings must rule out immunocompromise.  Check HIV antibody. 6.  Hypothyroidism: Check TSH; continue Synthroid 7.  Restless leg syndrome: Continue Requip 8.  DVT prophylaxis: Lovenox 9.  GI prophylaxis: None The patient is a full code.  Time spent on admission orders and patient care approximately 45 minutes  Harrie Foreman, MD 12/29/2017, 8:06 AM

## 2017-12-29 NOTE — Progress Notes (Signed)
Patient's family arrived with clothing. Patient transported stable via wheelchair with no complaints.

## 2017-12-29 NOTE — Plan of Care (Signed)
  Progressing Education: Knowledge of General Education information will improve 12/29/2017 0631 - Progressing by Loran Senters, RN Pain Managment: General experience of comfort will improve 12/29/2017 0631 - Progressing by Loran Senters, RN Safety: Ability to remain free from injury will improve 12/29/2017 0631 - Progressing by Loran Senters, RN Activity: Ability to tolerate increased activity will improve 12/29/2017 0631 - Progressing by Loran Senters, RN

## 2017-12-29 NOTE — Discharge Summary (Signed)
[facility] [place] NAME: Brandi Bright    MR#:  [id number]  DATE OF BIRTH:  1971-07-19  DATE OF ADMISSION:  12/28/2017 ADMITTING PHYSICIAN: Harrie Foreman, MD  DATE OF DISCHARGE: 12/29/2017  PRIMARY CARE PHYSICIAN: Cletis Athens, MD    ADMISSION DIAGNOSIS:  Chest pain, unspecified type [R07.9]  DISCHARGE DIAGNOSIS:  Active Problems:   Chest pain   SECONDARY DIAGNOSIS:   Past Medical History:  Diagnosis Date  . Arthritis   . Back pain   . MI, old   . Thyroid disease     HOSPITAL COURSE:   47 year old female with past medical history of previous MI, hypothyroidism, osteoarthritis of presented to the hospital with chest pain.  1. Chest pain-given previous cardiac history patient was observed overnight in the hospital. She had sets of cardiac markers checked which were negative. She underwent a nuclear medicine stress test which shows no evidence of acute myocardial ischemia with normal ejection fraction. She is clinically asymptomatic now and therefore being discharged home. -She will continue her carvedilol, nitroglycerin. She will continue follow-up with her cardiologist as outpatient.  2. Essential hypertension-patient will continue her carvedilol.  3. Hypothyroidism-patient will resume her Synthroid.  4. History of restless leg syndrome-patient will continue her Requip.  5. Pancytopenia-seems chronic per the patient, no acute bleeding. This can be further followed up as an outpatient.  DISCHARGE CONDITIONS:   Stable  CONSULTS OBTAINED:  Treatment Team:  Isaias Cowman, MD  DRUG ALLERGIES:   Allergies  Allergen Reactions  . Amoxicillin Rash and Other (See Comments)    Has patient had a PCN reaction causing immediate rash, facial/tongue/throat swelling, SOB or lightheadedness with hypotension: Yes Has patient had a PCN reaction causing severe rash involving mucus membranes or skin necrosis: No Has patient had  a PCN reaction that required hospitalization: No Has patient had a PCN reaction occurring within the last 10 years: No If all of the above answers are "NO", then may proceed with Cephalosporin use.   . Gabapentin Rash  . Vicodin [Hydrocodone-Acetaminophen] Rash    DISCHARGE MEDICATIONS:   Allergies as of 12/29/2017      Reactions   Amoxicillin Rash, Other (See Comments)   Has patient had a PCN reaction causing immediate rash, facial/tongue/throat swelling, SOB or lightheadedness with hypotension: Yes Has patient had a PCN reaction causing severe rash involving mucus membranes or skin necrosis: No Has patient had a PCN reaction that required hospitalization: No Has patient had a PCN reaction occurring within the last 10 years: No If all of the above answers are "NO", then may proceed with Cephalosporin use.   Gabapentin Rash   Vicodin [hydrocodone-acetaminophen] Rash      Medication List    TAKE these medications   carvedilol 3.125 MG tablet Commonly known as:  COREG Take 3.125 mg by mouth daily.   levothyroxine 200 MCG tablet Commonly known as:  SYNTHROID, LEVOTHROID Take 200 mcg by mouth daily.   NITROSTAT 0.4 MG SL tablet Generic drug:  nitroGLYCERIN Place 0.4 mg under the tongue every 5 (five) minutes x 3 doses as needed for chest pain.   oxyCODONE-acetaminophen 5-325 MG tablet Commonly known as:  PERCOCET Take 1 tablet by mouth every 6 (six) hours as needed. What changed:  when to take this   rOPINIRole 2 MG tablet Commonly known as:  REQUIP Take 2 mg by mouth at bedtime.         DISCHARGE INSTRUCTIONS:   DIET:  Cardiac diet  DISCHARGE CONDITION:  Stable  ACTIVITY:  Activity as tolerated  OXYGEN:  Home Oxygen: No.   Oxygen Delivery: room air  DISCHARGE LOCATION:  home   If you experience worsening of your admission symptoms, develop shortness of breath, life threatening emergency, suicidal or homicidal thoughts you must seek medical attention  immediately by calling 911 or calling your MD immediately  if symptoms less severe.  You Must read complete instructions/literature along with all the possible adverse reactions/side effects for all the Medicines you take and that have been prescribed to you. Take any new Medicines after you have completely understood and accpet all the possible adverse reactions/side effects.   Please note  You were cared for by a hospitalist during your hospital stay. If you have any questions about your discharge medications or the care you received while you were in the hospital after you are discharged, you can call the unit and asked to speak with the hospitalist on call if the hospitalist that took care of you is not available. Once you are discharged, your primary care physician will handle any further medical issues. Please note that NO REFILLS for any discharge medications will be authorized once you are discharged, as it is imperative that you return to your primary care physician (or establish a relationship with a primary care physician if you do not have one) for your aftercare needs so that they can reassess your need for medications and monitor your lab values.     Today   NO acute chest pain. Asymptomatic. Will d/c home as Stress trest is (-).   VITAL SIGNS:  Blood pressure 112/79, pulse 68, temperature 97.9 F (36.6 C), temperature source Oral, resp. rate 18, height 4\' 11"  (1.499 m), weight 87.6 kg (193 lb 3.2 oz), SpO2 97 %.  I/O:    Intake/Output Summary (Last 24 hours) at 12/29/2017 1509 Last data filed at 12/29/2017 1415 Gross per 24 hour  Intake 120 ml  Output -  Net 120 ml    PHYSICAL EXAMINATION:  GENERAL:  47 y.o.-year-old patient lying in the bed with no acute distress.  EYES: Pupils equal, round, reactive to light and accommodation. No scleral icterus. Extraocular muscles intact.  HEENT: Head atraumatic, normocephalic. Oropharynx and nasopharynx clear.  NECK:  Supple, no  jugular venous distention. No thyroid enlargement, no tenderness.  LUNGS: Normal breath sounds bilaterally, no wheezing, rales,rhonchi. No use of accessory muscles of respiration.  CARDIOVASCULAR: S1, S2 normal. No murmurs, rubs, or gallops.  ABDOMEN: Soft, non-tender, non-distended. Bowel sounds present. No organomegaly or mass.  EXTREMITIES: No pedal edema, cyanosis, or clubbing.  NEUROLOGIC: Cranial nerves II through XII are intact. No focal motor or sensory defecits b/l.  PSYCHIATRIC: The patient is alert and oriented x 3.  SKIN: No obvious rash, lesion, or ulcer.   DATA REVIEW:   CBC Recent Labs  Lab 12/28/17 2146  WBC 2.1*  HGB 11.6*  HCT 34.7*  PLT 56*    Chemistries  Recent Labs  Lab 12/28/17 2146  NA 140  K 3.3*  CL 110  CO2 23  GLUCOSE 117*  BUN 8  CREATININE 0.58  CALCIUM 8.6*  AST 219*  ALT 138*  ALKPHOS 114  BILITOT 1.0    Cardiac Enzymes Recent Labs  Lab 12/29/17 1241  TROPONINI <0.03    Microbiology Results  No results found for this or any previous visit.  RADIOLOGY:  Ct Abdomen Pelvis W Contrast  Result Date: 12/29/2017 CLINICAL DATA:  47 year old  female with abdominal pain. EXAM: CT ABDOMEN AND PELVIS WITH CONTRAST TECHNIQUE: Multidetector CT imaging of the abdomen and pelvis was performed using the standard protocol following bolus administration of intravenous contrast. CONTRAST:  152mL ISOVUE-370 IOPAMIDOL (ISOVUE-370) INJECTION 76% COMPARISON:  Abdominal ultrasound dated 02/04/2016 FINDINGS: Lower chest: The visualized lung bases are clear. No intra-free air or free fluid. Hepatobiliary: Cirrhosis. No intrahepatic biliary ductal dilatation. The gallbladder is contracted. Pancreas: Unremarkable. No pancreatic ductal dilatation or surrounding inflammatory changes. Spleen: The spleen is enlarged measuring approximately 19 cm in craniocaudal length. Adrenals/Urinary Tract: The adrenal glands are unremarkable. Subcentimeter right renal hypodense  lesion is too small to characterize but likely represents a cyst. There is no hydronephrosis on either side. There is symmetric enhancement and excretion of contrast by both kidneys. The visualized ureters and urinary bladder appear unremarkable. Stomach/Bowel: There is no bowel obstruction or active inflammation. Normal appendix. Small amount of contrast and air in the distal esophagus likely related to reflux. Vascular/Lymphatic: Minimal atherosclerotic calcification of the abdominal aorta. The aorta and IVC are otherwise unremarkable. The SMV, splenic vein, and main portal vein are patent. There is recanalization of the paraumbilical vein. Upper abdominal varices including esophageal and gastric varices noted. No adenopathy. Reproductive: The uterus and ovaries are grossly unremarkable. Other: None Musculoskeletal: Mild chronic compression of the lower thoracic spine. No acute osseous pathology. IMPRESSION: 1. No acute intra-abdominal or pelvic pathology. No bowel obstruction or active inflammation. Normal appendix. 2. Cirrhosis with portal hypertension, abdominal varices, and splenomegaly. Electronically Signed   By: Anner Crete M.D.   On: 12/29/2017 03:39   Nm Myocar Multi W/spect W/wall Motion / Ef  Result Date: 12/29/2017  Blood pressure demonstrated a normal response to exercise.  There was no ST segment deviation noted during stress.  The study is normal.  This is a low risk study.  The left ventricular ejection fraction is normal (55-65%).    Dg Chest Portable 1 View  Result Date: 12/28/2017 CLINICAL DATA:  Chest pain, diarrhea and weakness. EXAM: PORTABLE CHEST 1 VIEW COMPARISON:  Chest radiograph 08/22/2017. FINDINGS: The heart size and mediastinal contours are within normal limits. Both lungs are clear. The visualized skeletal structures are unremarkable. IMPRESSION: No active disease. Electronically Signed   By: Ulyses Jarred M.D.   On: 12/28/2017 22:13      Management plans  discussed with the patient, family and they are in agreement.  CODE STATUS:     Code Status Orders  (From admission, onward)        Start     Ordered   12/29/17 0539  Full code  Continuous     12/29/17 0539    Code Status History    Date Active Date Inactive Code Status Order ID Comments User Context   This patient has a current code status but no historical code status.      TOTAL TIME TAKING CARE OF THIS PATIENT: 40 minutes.    Henreitta Leber M.D on 12/29/2017 at 3:09 PM  Between 7am to 6pm - Pager - [phone]  After 6pm go to [email or web address] - Proofreader  [facility]  Office  [phone]  CC: Primary care physician; Cletis Athens, MD

## 2017-12-29 NOTE — Consult Note (Signed)
[facility]  CARDIOLOGY CONSULT NOTE  Patient ID: NILAM QUAKENBUSH MRN: [id number] DOB/AGE: 07/30/1971 47 y.o.  Admit date: 12/28/2017 Referring Physician [facility] Primary Physician [facility] Primary Cardiologist None per patient Reason for Consultation Chest pain  HPI:  Ms. 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 The pain started at rest and patient had associated shortness of breath, nausea with dry-heaving, abdominal pain, and radiation to left axilla. Patient states that the chest pain later became sharp and stabbing in nature. She took 2 sublingual nitroglycerins and 4 baby aspirins which didn't seem to help. The pain subsided about two hours after onset. Admission labs notable for troponin less than 0.03 x 2. EKG showed sinus tachycardia and minor QT prolongation, but no evidence of ischemia. Due to abdominal pain, CT of the abdomen was obtained which showed cirrhosis with portal hypertension. Patient's lipase was slightly elevated as well. Today, 12/29/17, patient denies chest pain, shortness of breath, abdominal pain, nausea, vomiting, or peripheral edema. Admits to fatigue.   According to the patient, a cardiac cathetarization was performed in 2014 which showed coronary artery disease, but intervention was not needed. Patient also states she had a treadmill stress test done in October per her primary care provider, which was reportedly normal. She does not follow up with a cardiologist on a regular basis.   Family history is significant for coronary artery disease. Her father had a triple bypass in his 46's. The patient's sister has had 2 MIs.    Review of systems complete and found to be negative unless listed above     Past Medical History:  Diagnosis Date  . Arthritis   . Back pain   . MI, old    . Thyroid disease     Past Surgical History:  Procedure Laterality Date  . CESAREAN SECTION      Medications Prior to Admission  Medication Sig Dispense Refill Last Dose  . carvedilol (COREG) 3.125 MG tablet Take 3.125 mg by mouth daily.   Past Week at Unknown time  . levothyroxine (SYNTHROID, LEVOTHROID) 200 MCG tablet Take 200 mcg by mouth daily.  8 12/28/2017 at Unknown time  . nitroGLYCERIN (NITROSTAT) 0.4 MG SL tablet Place 0.4 mg under the tongue every 5 (five) minutes x 3 doses as needed for chest pain.   12/28/2017 at Unknown time  . oxyCODONE-acetaminophen (PERCOCET) 5-325 MG tablet Take 1 tablet by mouth every 6 (six) hours as needed. (Patient taking differently: Take 1 tablet by mouth 3 (three) times daily. ) 8 tablet 0 Past Week at Unknown time  . rOPINIRole (REQUIP) 2 MG tablet Take 2 mg by mouth at bedtime.   Past Week at Unknown time   Social History   Socioeconomic History  . Marital status: Single    Spouse name: Not on file  . Number of children: Not on file  . Years of education: Not on file  . Highest education level: Not on file  Social Needs  . Financial resource strain: Not on file  . Food insecurity - worry: Not on file  . Food insecurity - inability: Not on file  . Transportation needs - medical: Not on file  . Transportation needs - non-medical: Not on file  Occupational History  . Not on file  Tobacco Use  . Smoking  status: Never Smoker  . Smokeless tobacco: Never Used  Substance and Sexual Activity  . Alcohol use: Yes    Comment: occasionally  . Drug use: Not on file  . Sexual activity: Not on file  Other Topics Concern  . Not on file  Social History Narrative  . Not on file    Family History  Problem Relation Age of Onset  . Hypertension Other   . CAD Father       Review of systems complete and found to be negative unless listed above      PHYSICAL EXAM  General: Well developed, well nourished, in no acute distress, lying supine in  bed HEENT:  Normocephalic and atramatic Neck:  No JVD.  Lungs: Clear bilaterally to auscultation, normal effort of breathing on room air Heart: HRRR . Normal S1 and S2 without gallops or murmurs.  Abdomen: Bowel sounds are positive, abdomen soft  Msk:  Back normal, able to turn in bed without difficulty. Gait not assessed. Normal strength and tone for age. Extremities: Trace bilateral lower extremity edema.   Neuro: Alert and oriented X 3. Psych:  Good affect, responds appropriately  Labs:   Lab Results  Component Value Date   WBC 2.1 (L) 12/28/2017   HGB 11.6 (L) 12/28/2017   HCT 34.7 (L) 12/28/2017   MCV 86.4 12/28/2017   PLT 56 (L) 12/28/2017    Recent Labs  Lab 12/28/17 2146  NA 140  K 3.3*  CL 110  CO2 23  BUN 8  CREATININE 0.58  CALCIUM 8.6*  PROT 6.9  BILITOT 1.0  ALKPHOS 114  ALT 138*  AST 219*  GLUCOSE 117*   Lab Results  Component Value Date   CKTOTAL 54 07/02/2012   CKMB 0.9 08/24/2013   TROPONINI <0.03 12/28/2017    Lab Results  Component Value Date   CHOL 109 08/25/2013   CHOL 84 07/03/2012   CHOL 101 04/08/2012   Lab Results  Component Value Date   HDL 21 (L) 08/25/2013   HDL 18 (L) 07/03/2012   HDL 24 (L) 04/08/2012   Lab Results  Component Value Date   LDLCALC 67 08/25/2013   LDLCALC 40 07/03/2012   LDLCALC 56 04/08/2012   Lab Results  Component Value Date   TRIG 107 08/25/2013   TRIG 128 07/03/2012   TRIG 107 04/08/2012   No results found for: CHOLHDL No results found for: LDLDIRECT    Radiology: Ct Abdomen Pelvis W Contrast  Result Date: 12/29/2017 CLINICAL DATA:  47 year old female with abdominal pain. EXAM: CT ABDOMEN AND PELVIS WITH CONTRAST TECHNIQUE: Multidetector CT imaging of the abdomen and pelvis was performed using the standard protocol following bolus administration of intravenous contrast. CONTRAST:  180mL ISOVUE-370 IOPAMIDOL (ISOVUE-370) INJECTION 76% COMPARISON:  Abdominal ultrasound dated 02/04/2016 FINDINGS:  Lower chest: The visualized lung bases are clear. No intra-free air or free fluid. Hepatobiliary: Cirrhosis. No intrahepatic biliary ductal dilatation. The gallbladder is contracted. Pancreas: Unremarkable. No pancreatic ductal dilatation or surrounding inflammatory changes. Spleen: The spleen is enlarged measuring approximately 19 cm in craniocaudal length. Adrenals/Urinary Tract: The adrenal glands are unremarkable. Subcentimeter right renal hypodense lesion is too small to characterize but likely represents a cyst. There is no hydronephrosis on either side. There is symmetric enhancement and excretion of contrast by both kidneys. The visualized ureters and urinary bladder appear unremarkable. Stomach/Bowel: There is no bowel obstruction or active inflammation. Normal appendix. Small amount of contrast and air in the distal esophagus likely related to  reflux. Vascular/Lymphatic: Minimal atherosclerotic calcification of the abdominal aorta. The aorta and IVC are otherwise unremarkable. The SMV, splenic vein, and main portal vein are patent. There is recanalization of the paraumbilical vein. Upper abdominal varices including esophageal and gastric varices noted. No adenopathy. Reproductive: The uterus and ovaries are grossly unremarkable. Other: None Musculoskeletal: Mild chronic compression of the lower thoracic spine. No acute osseous pathology. IMPRESSION: 1. No acute intra-abdominal or pelvic pathology. No bowel obstruction or active inflammation. Normal appendix. 2. Cirrhosis with portal hypertension, abdominal varices, and splenomegaly. Electronically Signed   By: Anner Crete M.D.   On: 12/29/2017 03:39   Dg Chest Portable 1 View  Result Date: 12/28/2017 CLINICAL DATA:  Chest pain, diarrhea and weakness. EXAM: PORTABLE CHEST 1 VIEW COMPARISON:  Chest radiograph 08/22/2017. FINDINGS: The heart size and mediastinal contours are within normal limits. Both lungs are clear. The visualized skeletal  structures are unremarkable. IMPRESSION: No active disease. Electronically Signed   By: Ulyses Jarred M.D.   On: 12/28/2017 22:13    EKG: Sinus rhythm, 71 bpm  ASSESSMENT AND PLAN:  1. Chest pain, with typical and atypical features, with prior history of CAD, status post 2 MI's in 2013 and 2014 without PCI. Troponin x 2 negative, ECG without acute ST-T wave abnormalities.  Recommendations: 1. Agree with overall therapy. 2. Lexiscan Myoview 3. Continue carvedilol 4. Check lipid panel 5. Further recommendations pending Lexiscan results and patient's initial course.  Signed:  Doristine Mango, PA-S; The exam findings and history were discussed with Dr. Saralyn Pilar and Clabe Seal, PA-C, the patient was interviewed and examined in conjunction with Clabe Seal, and the plan was made in collaboration with them.  Clabe Seal PA-C 12/29/2017, 8:39 AM

## 2017-12-30 LAB — HIV ANTIBODY (ROUTINE TESTING W REFLEX): HIV Screen 4th Generation wRfx: NONREACTIVE

## 2017-12-30 LAB — HEPATITIS PANEL, ACUTE
HCV Ab: >11 s/co ratio — ABNORMAL HIGH (ref 0.0–0.9)
Hep A IgM: NEGATIVE
Hep B C IgM: NEGATIVE
Hepatitis B Surface Ag: NEGATIVE

## 2018-02-15 DIAGNOSIS — H524 Presbyopia: Secondary | ICD-10-CM | POA: Diagnosis not present

## 2018-02-22 ENCOUNTER — Ambulatory Visit
Admission: RE | Admit: 2018-02-22 | Discharge: 2018-02-22 | Disposition: A | Discharge status: Home / Self Care | Payer: Medicaid Other | Source: Ambulatory Visit | Attending: Internal Medicine | Admitting: Internal Medicine

## 2018-02-22 ENCOUNTER — Other Ambulatory Visit: Payer: Self-pay | Admitting: Internal Medicine

## 2018-02-22 DIAGNOSIS — R52 Pain, unspecified: Secondary | ICD-10-CM

## 2018-02-22 DIAGNOSIS — M542 Cervicalgia: Secondary | ICD-10-CM | POA: Insufficient documentation

## 2018-02-22 DIAGNOSIS — M25562 Pain in left knee: Secondary | ICD-10-CM | POA: Diagnosis not present

## 2018-02-22 DIAGNOSIS — M1712 Unilateral primary osteoarthritis, left knee: Secondary | ICD-10-CM | POA: Insufficient documentation

## 2018-02-28 ENCOUNTER — Other Ambulatory Visit: Payer: Self-pay | Admitting: Internal Medicine

## 2018-02-28 DIAGNOSIS — Z1231 Encounter for screening mammogram for malignant neoplasm of breast: Secondary | ICD-10-CM

## 2018-03-04 ENCOUNTER — Other Ambulatory Visit: Payer: Self-pay

## 2018-03-04 ENCOUNTER — Telehealth: Payer: Self-pay

## 2018-03-04 DIAGNOSIS — Z1211 Encounter for screening for malignant neoplasm of colon: Secondary | ICD-10-CM

## 2018-03-04 NOTE — Telephone Encounter (Signed)
Gastroenterology Pre-Procedure Review  Request Date:  Requesting Physician: Dr.   PATIENT REVIEW QUESTIONS: The patient responded to the following health history questions as indicated:    1. Are you having any GI issues? no 2. Do you have a personal history of Polyps? no can't remember, unsure 3. Do you have a family history of Colon Cancer or Polyps? yes (brother, sister, father) 78. Diabetes Mellitus? no 5. Joint replacements in the past 12 months?no 6. Major health problems in the past 3 months?yes (january) 7. Any artificial heart valves, MVP, or defibrillator?no 2 MI's previously    MEDICATIONS & ALLERGIES:    Patient reports the following regarding taking any anticoagulation/antiplatelet therapy:   Plavix, Coumadin, Eliquis, Xarelto, Lovenox, Pradaxa, Brilinta, or Effient? no Aspirin? no  Patient confirms/reports the following medications:  Current Outpatient Medications  Medication Sig Dispense Refill  . carvedilol (COREG) 3.125 MG tablet Take 3.125 mg by mouth daily.    Marland Kitchen levothyroxine (SYNTHROID, LEVOTHROID) 200 MCG tablet Take 200 mcg by mouth daily.  8  . nitroGLYCERIN (NITROSTAT) 0.4 MG SL tablet Place 0.4 mg under the tongue every 5 (five) minutes x 3 doses as needed for chest pain.    Marland Kitchen oxyCODONE-acetaminophen (PERCOCET) 5-325 MG tablet Take 1 tablet by mouth every 6 (six) hours as needed. (Patient taking differently: Take 1 tablet by mouth 3 (three) times daily. ) 8 tablet 0  . rOPINIRole (REQUIP) 2 MG tablet Take 2 mg by mouth at bedtime.     No current facility-administered medications for this visit.     Patient confirms/reports the following allergies:  Allergies  Allergen Reactions  . Amoxicillin Rash and Other (See Comments)    Has patient had a PCN reaction causing immediate rash, facial/tongue/throat swelling, SOB or lightheadedness with hypotension: Yes Has patient had a PCN reaction causing severe rash involving mucus membranes or skin necrosis: No Has  patient had a PCN reaction that required hospitalization: No Has patient had a PCN reaction occurring within the last 10 years: No If all of the above answers are "NO", then may proceed with Cephalosporin use.   . Gabapentin Rash  . Vicodin [Hydrocodone-Acetaminophen] Rash    No orders of the defined types were placed in this encounter.   AUTHORIZATION INFORMATION Primary Insurance: 1D#: Group #:  Secondary Insurance: 1D#: Group #:  SCHEDULE INFORMATION: Date: 03/16/18 Time: Location: [place]

## 2018-03-16 ENCOUNTER — Ambulatory Visit: Payer: Medicaid Other | Admitting: Anesthesiology

## 2018-03-16 ENCOUNTER — Other Ambulatory Visit: Payer: Self-pay

## 2018-03-16 ENCOUNTER — Ambulatory Visit
Admission: RE | Admit: 2018-03-16 | Discharge: 2018-03-16 | Disposition: A | Discharge status: Home / Self Care | Payer: Medicaid Other | Source: Ambulatory Visit | Attending: Gastroenterology | Admitting: Gastroenterology

## 2018-03-16 ENCOUNTER — Encounter
Admission: RE | Disposition: A | Discharge status: Home / Self Care | Payer: Self-pay | Source: Ambulatory Visit | Attending: Gastroenterology

## 2018-03-16 ENCOUNTER — Encounter: Payer: Self-pay | Admitting: Anesthesiology

## 2018-03-16 DIAGNOSIS — D123 Benign neoplasm of transverse colon: Secondary | ICD-10-CM

## 2018-03-16 DIAGNOSIS — D122 Benign neoplasm of ascending colon: Secondary | ICD-10-CM | POA: Diagnosis not present

## 2018-03-16 DIAGNOSIS — D125 Benign neoplasm of sigmoid colon: Secondary | ICD-10-CM | POA: Diagnosis not present

## 2018-03-16 DIAGNOSIS — Z800 Family history of malignant neoplasm of digestive organs: Secondary | ICD-10-CM | POA: Diagnosis not present

## 2018-03-16 DIAGNOSIS — Z79899 Other long term (current) drug therapy: Secondary | ICD-10-CM | POA: Insufficient documentation

## 2018-03-16 DIAGNOSIS — E039 Hypothyroidism, unspecified: Secondary | ICD-10-CM | POA: Insufficient documentation

## 2018-03-16 DIAGNOSIS — Z1211 Encounter for screening for malignant neoplasm of colon: Secondary | ICD-10-CM | POA: Diagnosis not present

## 2018-03-16 DIAGNOSIS — K635 Polyp of colon: Secondary | ICD-10-CM | POA: Diagnosis not present

## 2018-03-16 DIAGNOSIS — D124 Benign neoplasm of descending colon: Secondary | ICD-10-CM | POA: Diagnosis not present

## 2018-03-16 DIAGNOSIS — K644 Residual hemorrhoidal skin tags: Secondary | ICD-10-CM | POA: Diagnosis not present

## 2018-03-16 DIAGNOSIS — I252 Old myocardial infarction: Secondary | ICD-10-CM | POA: Insufficient documentation

## 2018-03-16 HISTORY — PX: COLONOSCOPY WITH PROPOFOL: SHX5780

## 2018-03-16 HISTORY — DX: Hypothyroidism, unspecified: E03.9

## 2018-03-16 LAB — POCT PREGNANCY, URINE: PREG TEST UR: NEGATIVE

## 2018-03-16 SURGERY — COLONOSCOPY WITH PROPOFOL
Anesthesia: General

## 2018-03-16 MED ORDER — MIDAZOLAM HCL 2 MG/2ML IJ SOLN
INTRAMUSCULAR | Status: AC
  Filled 2018-03-16: qty 2

## 2018-03-16 MED ORDER — MIDAZOLAM HCL 2 MG/2ML IJ SOLN
INTRAMUSCULAR | Status: DC | PRN
  Administered 2018-03-16: 2 mg via INTRAVENOUS

## 2018-03-16 MED ORDER — PROPOFOL 10 MG/ML IV BOLUS
INTRAVENOUS | Status: AC
  Filled 2018-03-16: qty 20

## 2018-03-16 MED ORDER — PROPOFOL 10 MG/ML IV BOLUS
INTRAVENOUS | Status: DC | PRN
  Administered 2018-03-16: 100 mg via INTRAVENOUS

## 2018-03-16 MED ORDER — SODIUM CHLORIDE 0.9 % IV SOLN
INTRAVENOUS | Status: DC
  Administered 2018-03-16: 1000 mL via INTRAVENOUS

## 2018-03-16 MED ORDER — PHENYLEPHRINE HCL 10 MG/ML IJ SOLN
INTRAMUSCULAR | Status: DC | PRN
  Administered 2018-03-16 (×6): 200 ug via INTRAVENOUS
  Administered 2018-03-16: 100 ug via INTRAVENOUS
  Administered 2018-03-16: 200 ug via INTRAVENOUS
  Administered 2018-03-16: 100 ug via INTRAVENOUS
  Administered 2018-03-16: 200 ug via INTRAVENOUS
  Administered 2018-03-16: 100 ug via INTRAVENOUS

## 2018-03-16 MED ORDER — LIDOCAINE HCL (PF) 2 % IJ SOLN
INTRAMUSCULAR | Status: AC
  Filled 2018-03-16: qty 10

## 2018-03-16 MED ORDER — LIDOCAINE HCL (PF) 1 % IJ SOLN
INTRAMUSCULAR | Status: AC
  Administered 2018-03-16: 0.3 mL
  Filled 2018-03-16: qty 2

## 2018-03-16 MED ORDER — LIDOCAINE HCL (CARDIAC) 20 MG/ML IV SOLN
INTRAVENOUS | Status: DC | PRN
  Administered 2018-03-16: 40 mg via INTRAVENOUS

## 2018-03-16 MED ORDER — PROPOFOL 500 MG/50ML IV EMUL
INTRAVENOUS | Status: DC | PRN
  Administered 2018-03-16: 175 ug/kg/min via INTRAVENOUS

## 2018-03-16 NOTE — Anesthesia Postprocedure Evaluation (Signed)
Anesthesia Post Note  Patient: Brandi Bright  Procedure(s) Performed: COLONOSCOPY WITH PROPOFOL (N/A )  Patient location during evaluation: Endoscopy Anesthesia Type: General Level of consciousness: awake and alert Pain management: pain level controlled Vital Signs Assessment: post-procedure vital signs reviewed and stable Respiratory status: spontaneous breathing, nonlabored ventilation, respiratory function stable and patient connected to nasal cannula oxygen Cardiovascular status: blood pressure returned to baseline and stable Postop Assessment: no apparent nausea or vomiting Anesthetic complications: no     Last Vitals:  Vitals:   03/16/18 1317 03/16/18 1327  BP: 104/76 102/73  Pulse: 71 67  Resp: 14 16  Temp: (!) 36.1 C   SpO2: 98% 98%    Last Pain:  Vitals:   03/16/18 1327  TempSrc:   PainSc: 0-No pain                 Elga Santy S

## 2018-03-16 NOTE — H&P (Signed)
Brandi Antigua, MD [address], [place], [place], [place], [address] [id number], [place], [place], [place], [address] Phone: [phone]  Fax: [phone]  Primary Care Physician:  Cletis Athens, MD   Pre-Procedure History & Physical: HPI:  Brandi Bright is a 47 y.o. female is here for a colonoscopy.   Past Medical History:  Diagnosis Date  . Arthritis   . Back pain   . Hypothyroidism   . MI, old   . Thyroid disease     Past Surgical History:  Procedure Laterality Date  . CARDIAC CATHETERIZATION    . CESAREAN SECTION    . TUBAL LIGATION      Prior to Admission medications   Medication Sig Start Date End Date Taking? Authorizing Provider  carvedilol (COREG) 3.125 MG tablet Take 3.125 mg by mouth daily.   Yes [provider]  levothyroxine (SYNTHROID, LEVOTHROID) 200 MCG tablet Take 200 mcg by mouth daily. 11/26/17  Yes [provider]  oxyCODONE-acetaminophen (PERCOCET) 5-325 MG tablet Take 1 tablet by mouth every 6 (six) hours as needed. Patient taking differently: Take 1 tablet by mouth 3 (three) times daily.  02/04/16  Yes Drenda Freeze, MD  traZODone (DESYREL) 50 MG tablet Take 50 mg by mouth at bedtime.   Yes [provider]  nitroGLYCERIN (NITROSTAT) 0.4 MG SL tablet Place 0.4 mg under the tongue every 5 (five) minutes x 3 doses as needed for chest pain.    [provider]  rOPINIRole (REQUIP) 2 MG tablet Take 2 mg by mouth at bedtime.    [provider]    Allergies as of 03/04/2018 - Review Complete 12/29/2017  Allergen Reaction Noted  . Amoxicillin Rash and Other (See Comments) 01/11/2016  . Gabapentin Rash 01/11/2016  . Vicodin [hydrocodone-acetaminophen] Rash 01/11/2016    Family History  Problem Relation Age of Onset  . Hypertension Other   . CAD Father     Social History   Socioeconomic History  . Marital status: Single    Spouse name: Not on file  . Number of children: Not on file  .  Years of education: Not on file  . Highest education level: Not on file  Occupational History  . Not on file  Social Needs  . Financial resource strain: Not on file  . Food insecurity:    Worry: Not on file    Inability: Not on file  . Transportation needs:    Medical: Not on file    Non-medical: Not on file  Tobacco Use  . Smoking status: Never Smoker  . Smokeless tobacco: Never Used  Substance and Sexual Activity  . Alcohol use: Yes    Comment: occasionally  . Drug use: Never  . Sexual activity: Not Currently  Lifestyle  . Physical activity:    Days per week: Not on file    Minutes per session: Not on file  . Stress: Not on file  Relationships  . Social connections:    Talks on phone: Not on file    Gets together: Not on file    Attends religious service: Not on file    Active member of club or organization: Not on file    Attends meetings of clubs or organizations: Not on file    Relationship status: Not on file  . Intimate partner violence:    Fear of current or ex partner: Not on file    Emotionally abused: Not on file    Physically abused: Not on file  Forced sexual activity: Not on file  Other Topics Concern  . Not on file  Social History Narrative  . Not on file    Review of Systems: See HPI, otherwise negative ROS  Physical Exam: BP (!) 144/89   Temp 97.9 F (36.6 C) (Tympanic)   Resp 18   Ht 4\' 11"  (1.499 m)   Wt 193 lb (87.5 kg)   SpO2 100%   BMI 38.98 kg/m  General:   Alert,  pleasant and cooperative in NAD Head:  Normocephalic and atraumatic. Neck:  Supple; no masses or thyromegaly. Lungs:  Clear throughout to auscultation, normal respiratory effort.    Heart:  +S1, +S2, Regular rate and rhythm, No edema. Abdomen:  Soft, nontender and nondistended. Normal bowel sounds, without guarding, and without rebound.   Neurologic:  Alert and  oriented x4;  grossly normal neurologically.  Impression/Plan: Brandi Bright is here for a colonoscopy to  be performed for high risk screening.  Risks, benefits, limitations, and alternatives regarding  colonoscopy have been reviewed with the patient.  Questions have been answered.  All parties agreeable.   Virgel Manifold, MD  03/16/2018, 12:13 PM

## 2018-03-16 NOTE — Anesthesia Preprocedure Evaluation (Addendum)
Anesthesia Evaluation  Patient identified by MRN, date of birth, ID band Patient awake    Reviewed: Allergy & Precautions, NPO status , Patient's Chart, lab work & pertinent test results, reviewed documented beta blocker date and time   Airway Mallampati: III  TM Distance: >3 FB     Dental  (+) Chipped, Missing   Pulmonary           Cardiovascular + Past MI       Neuro/Psych    GI/Hepatic   Endo/Other    Renal/GU      Musculoskeletal  (+) Arthritis ,   Abdominal   Peds  Hematology   Anesthesia Other Findings Obese. Does not take NTG.  Reproductive/Obstetrics                            Anesthesia Physical Anesthesia Plan  ASA: III  Anesthesia Plan: General   Post-op Pain Management:    Induction: Intravenous  PONV Risk Score and Plan:   Airway Management Planned:   Additional Equipment:   Intra-op Plan:   Post-operative Plan:   Informed Consent: I have reviewed the patients History and Physical, chart, labs and discussed the procedure including the risks, benefits and alternatives for the proposed anesthesia with the patient or authorized representative who has indicated his/her understanding and acceptance.     Plan Discussed with: CRNA  Anesthesia Plan Comments:         Anesthesia Quick Evaluation

## 2018-03-16 NOTE — Anesthesia Procedure Notes (Signed)
Date/Time: 03/16/2018 12:20 PM Performed by: Doreen Salvage, CRNA Pre-anesthesia Checklist: Patient identified, Emergency Drugs available, Suction available and Patient being monitored Patient Re-evaluated:Patient Re-evaluated prior to induction Oxygen Delivery Method: Nasal cannula Induction Type: IV induction Dental Injury: Teeth and Oropharynx as per pre-operative assessment  Comments: Nasal cannula with etCO2 monitoring

## 2018-03-16 NOTE — Transfer of Care (Signed)
Immediate Anesthesia Transfer of Care Note  Patient: Brandi Bright  Procedure(s) Performed: Procedure(s): COLONOSCOPY WITH PROPOFOL (N/A)  Patient Location: PACU  Anesthesia Type:General  Level of Consciousness: sedated  Airway & Oxygen Therapy: Patient Spontanous Breathing and Patient connected to face mask oxygen  Post-op Assessment: Report given to RN and Post -op Vital signs reviewed and stable  Post vital signs: Reviewed and stable  Last Vitals:  Vitals:   03/16/18 1102 03/16/18 1317  BP: (!) 144/89 104/76  Pulse:  71  Resp: 18 14  Temp: 36.6 C (!) 36.1 C  SpO2: 847% 84%    Complications: No apparent anesthesia complications

## 2018-03-16 NOTE — Op Note (Signed)
[facility] Patient Name: Brandi Bright Procedure Date: 03/16/2018 12:20 PM MRN: [id number] Account #: [id number] Date of Birth: Apr 13, 1971 Admit Type: Outpatient Age: 47 Room: [facility] ENDO ROOM 2 Gender: Female Note Status: Finalized Procedure:            Colonoscopy Indications:          Screening for colorectal malignant neoplasm, Screening                        in patient at increased risk: Family history of                        1st-degree relative with colorectal cancer before age                        32 years Providers:            Tamitha Norell B. Maximino Greenland MD, MD Referring MD:         Corky Downs, MD (Referring MD) Medicines:            Monitored Anesthesia Care Complications:        No immediate complications. Procedure:            Pre-Anesthesia Assessment:                       - ASA Grade Assessment: III - A patient with severe                        systemic disease.                       - Prior to the procedure, a History and Physical was                        performed, and patient medications, allergies and                        sensitivities were reviewed. The patient's tolerance of                        previous anesthesia was reviewed.                       - The risks and benefits of the procedure and the                        sedation options and risks were discussed with the                        patient. All questions were answered and informed                        consent was obtained.                       - Patient identification and proposed procedure were                        verified prior to the procedure by the physician, the  nurse, the anesthesiologist, the anesthetist and the                        technician. The procedure was verified in the procedure                        room.                       After obtaining informed consent, the colonoscope was                        passed  under direct vision. Throughout the procedure,                        the patient's blood pressure, pulse, and oxygen                        saturations were monitored continuously. The                        Colonoscope was introduced through the anus and                        advanced to the the cecum, identified by appendiceal                        orifice and ileocecal valve. The colonoscopy was                        performed with ease. The patient tolerated the                        procedure well. The quality of the bowel preparation                        was good. Findings:      The perianal exam findings include non-thrombosed external hemorrhoids.      Four sessile polyps were found in the transverse colon and ascending       colon. The polyps were 3 to 5 mm in size. These polyps were removed with       a cold snare. Resection and retrieval were complete.      A 2 mm polyp was found in the transverse colon. The polyp was sessile.       The polyp was removed with a cold biopsy forceps. Resection and       retrieval were complete.      A 7 mm polyp was found in the descending colon. The polyp was sessile.       The polyp was removed with a hot snare. Resection and retrieval were       complete.      A 4 mm polyp was found in the sigmoid colon. The polyp was flat. The       polyp was removed with a cold biopsy forceps. Resection and retrieval       were complete.      The exam was otherwise without abnormality.      The rectum, sigmoid colon, descending colon, transverse colon, ascending       colon and cecum appeared normal.      Non-bleeding internal hemorrhoids  were found during retroflexion. Impression:           - Non-thrombosed external hemorrhoids found on perianal                        exam.                       - Four 3 to 5 mm polyps in the transverse colon and in                        the ascending colon, removed with a cold snare.                         Resected and retrieved.                       - One 2 mm polyp in the transverse colon, removed with                        a cold biopsy forceps. Resected and retrieved.                       - One 7 mm polyp in the descending colon, removed with                        a hot snare. Resected and retrieved.                       - One 4 mm polyp in the sigmoid colon, removed with a                        cold biopsy forceps. Resected and retrieved.                       - The examination was otherwise normal.                       - The rectum, sigmoid colon, descending colon,                        transverse colon, ascending colon and cecum are normal.                       - Non-bleeding internal hemorrhoids. Recommendation:       - Discharge patient to home (with escort).                       - Advance diet as tolerated.                       - Continue present medications.                       - Await pathology results.                       - Repeat colonoscopy date to be determined after                        pending pathology results are reviewed for surveillance.                       -  The findings and recommendations were discussed with                        the patient.                       - The findings and recommendations were discussed with                        the patient's family.                       - Return to primary care physician as previously                        scheduled.                       - High fiber diet. Procedure Code(s):    --- Professional ---                       [phone], Colonoscopy, flexible; with removal of tumor(s),                        polyp(s), or other lesion(s) by snare technique                       45380, 59, Colonoscopy, flexible; with biopsy, single                        or multiple Diagnosis Code(s):    --- Professional ---                       Z12.11, Encounter for screening for malignant neoplasm                         of colon                       Z80.0, Family history of malignant neoplasm of                        digestive organs                       D12.3, Benign neoplasm of transverse colon (hepatic                        flexure or splenic flexure)                       D12.2, Benign neoplasm of ascending colon                       D12.4, Benign neoplasm of descending colon                       D12.5, Benign neoplasm of sigmoid colon                       K64.4, Residual hemorrhoidal skin tags                       K64.8, Other hemorrhoids CPT copyright  2017 American Medical Association. All rights reserved. The codes documented in this report are preliminary and upon coder review may  be revised to meet current compliance requirements.  [name], MD [name] MD, MD 03/16/2018 1:20:21 PM This report has been signed electronically. Number of Addenda: 0 Note Initiated On: 03/16/2018 12:20 PM Scope Withdrawal Time: 0 hours 31 minutes 22 seconds  Total Procedure Duration: 0 hours 43 minutes 49 seconds  Estimated Blood Loss: Estimated blood loss: none.      [facility]

## 2018-03-16 NOTE — Anesthesia Post-op Follow-up Note (Signed)
Anesthesia QCDR form completed.        

## 2018-03-17 DIAGNOSIS — D123 Benign neoplasm of transverse colon: Secondary | ICD-10-CM

## 2018-03-17 DIAGNOSIS — K635 Polyp of colon: Secondary | ICD-10-CM

## 2018-03-17 DIAGNOSIS — Z1211 Encounter for screening for malignant neoplasm of colon: Secondary | ICD-10-CM

## 2018-03-17 DIAGNOSIS — D124 Benign neoplasm of descending colon: Secondary | ICD-10-CM

## 2018-03-17 DIAGNOSIS — D122 Benign neoplasm of ascending colon: Secondary | ICD-10-CM

## 2018-03-17 DIAGNOSIS — D125 Benign neoplasm of sigmoid colon: Secondary | ICD-10-CM

## 2018-03-17 DIAGNOSIS — Z800 Family history of malignant neoplasm of digestive organs: Secondary | ICD-10-CM

## 2018-03-18 ENCOUNTER — Encounter: Payer: Self-pay | Admitting: Gastroenterology

## 2018-03-18 LAB — SURGICAL PATHOLOGY

## 2018-03-21 ENCOUNTER — Encounter: Payer: Self-pay | Admitting: Gastroenterology

## 2018-07-13 ENCOUNTER — Encounter: Payer: Self-pay | Admitting: Emergency Medicine

## 2018-07-13 ENCOUNTER — Other Ambulatory Visit: Payer: Self-pay

## 2018-07-13 DIAGNOSIS — Z79899 Other long term (current) drug therapy: Secondary | ICD-10-CM | POA: Insufficient documentation

## 2018-07-13 DIAGNOSIS — R748 Abnormal levels of other serum enzymes: Secondary | ICD-10-CM | POA: Diagnosis not present

## 2018-07-13 DIAGNOSIS — R778 Other specified abnormalities of plasma proteins: Secondary | ICD-10-CM | POA: Diagnosis not present

## 2018-07-13 DIAGNOSIS — R1084 Generalized abdominal pain: Secondary | ICD-10-CM | POA: Insufficient documentation

## 2018-07-13 DIAGNOSIS — R112 Nausea with vomiting, unspecified: Secondary | ICD-10-CM | POA: Diagnosis not present

## 2018-07-13 DIAGNOSIS — E039 Hypothyroidism, unspecified: Secondary | ICD-10-CM | POA: Diagnosis not present

## 2018-07-13 DIAGNOSIS — R1110 Vomiting, unspecified: Secondary | ICD-10-CM | POA: Diagnosis not present

## 2018-07-13 DIAGNOSIS — R109 Unspecified abdominal pain: Secondary | ICD-10-CM | POA: Diagnosis not present

## 2018-07-13 DIAGNOSIS — R7989 Other specified abnormal findings of blood chemistry: Secondary | ICD-10-CM | POA: Diagnosis not present

## 2018-07-13 LAB — COMPREHENSIVE METABOLIC PANEL
ALT: 79 U/L — AB (ref 0–44)
ANION GAP: 6 (ref 5–15)
AST: 150 U/L — ABNORMAL HIGH (ref 15–41)
Albumin: 3.2 g/dL — ABNORMAL LOW (ref 3.5–5.0)
Alkaline Phosphatase: 83 U/L (ref 38–126)
BUN: 6 mg/dL (ref 6–20)
CALCIUM: 8.3 mg/dL — AB (ref 8.9–10.3)
CO2: 23 mmol/L (ref 22–32)
CREATININE: 0.7 mg/dL (ref 0.44–1.00)
Chloride: 109 mmol/L (ref 98–111)
Glucose, Bld: 110 mg/dL — ABNORMAL HIGH (ref 70–99)
Potassium: 3.6 mmol/L (ref 3.5–5.1)
Sodium: 138 mmol/L (ref 135–145)
Total Bilirubin: 1.3 mg/dL — ABNORMAL HIGH (ref 0.3–1.2)
Total Protein: 7.1 g/dL (ref 6.5–8.1)

## 2018-07-13 LAB — CBC
HCT: 36.2 % (ref 35.0–47.0)
HEMOGLOBIN: 12.3 g/dL (ref 12.0–16.0)
MCH: 30.2 pg (ref 26.0–34.0)
MCHC: 33.9 g/dL (ref 32.0–36.0)
MCV: 89 fL (ref 80.0–100.0)
PLATELETS: 54 10*3/uL — AB (ref 150–440)
RBC: 4.07 MIL/uL (ref 3.80–5.20)
RDW: 14.4 % (ref 11.5–14.5)
WBC: 3.5 10*3/uL — AB (ref 3.6–11.0)

## 2018-07-13 LAB — LIPASE, BLOOD: Lipase: 45 U/L (ref 11–51)

## 2018-07-13 NOTE — ED Triage Notes (Signed)
Pt presents to ED with c/o epigastric abd pain since Monday. Nausea since Tuesday and today noticed left arm numbness. Denies similar pain previously.

## 2018-07-14 ENCOUNTER — Emergency Department: Payer: Medicaid Other

## 2018-07-14 ENCOUNTER — Emergency Department
Admission: EM | Admit: 2018-07-14 | Discharge: 2018-07-14 | Disposition: A | Discharge status: Home / Self Care | Payer: Medicaid Other | Attending: Emergency Medicine | Admitting: Emergency Medicine

## 2018-07-14 ENCOUNTER — Encounter: Payer: Self-pay | Admitting: Radiology

## 2018-07-14 DIAGNOSIS — R1084 Generalized abdominal pain: Secondary | ICD-10-CM

## 2018-07-14 DIAGNOSIS — R778 Other specified abnormalities of plasma proteins: Secondary | ICD-10-CM

## 2018-07-14 DIAGNOSIS — R112 Nausea with vomiting, unspecified: Secondary | ICD-10-CM | POA: Diagnosis not present

## 2018-07-14 DIAGNOSIS — R7989 Other specified abnormal findings of blood chemistry: Secondary | ICD-10-CM

## 2018-07-14 DIAGNOSIS — R109 Unspecified abdominal pain: Secondary | ICD-10-CM | POA: Diagnosis not present

## 2018-07-14 DIAGNOSIS — R1110 Vomiting, unspecified: Secondary | ICD-10-CM | POA: Diagnosis not present

## 2018-07-14 HISTORY — DX: Gout, unspecified: M10.9

## 2018-07-14 LAB — URINALYSIS, COMPLETE (UACMP) WITH MICROSCOPIC
Bacteria, UA: NONE SEEN
Bilirubin Urine: NEGATIVE
GLUCOSE, UA: NEGATIVE mg/dL
Hgb urine dipstick: NEGATIVE
KETONES UR: NEGATIVE mg/dL
Nitrite: NEGATIVE
Protein, ur: NEGATIVE mg/dL
SPECIFIC GRAVITY, URINE: 1.017 (ref 1.005–1.030)
Squamous Epithelial / LPF: NONE SEEN (ref 0–5)
pH: 6 (ref 5.0–8.0)

## 2018-07-14 LAB — TROPONIN I
Troponin I: 0.04 ng/mL (ref <0.03)
Troponin I: 0.05 ng/mL (ref <0.03)

## 2018-07-14 LAB — POCT PREGNANCY, URINE: Preg Test, Ur: NEGATIVE

## 2018-07-14 MED ORDER — ONDANSETRON HCL 4 MG/2ML IJ SOLN
4.0000 mg | INTRAMUSCULAR | Status: AC
  Administered 2018-07-14: 4 mg via INTRAVENOUS
  Filled 2018-07-14: qty 2

## 2018-07-14 MED ORDER — IOHEXOL 300 MG/ML  SOLN
75.0000 mL | Freq: Once | INTRAMUSCULAR | Status: AC | PRN
  Administered 2018-07-14: 75 mL via INTRAVENOUS

## 2018-07-14 MED ORDER — MORPHINE SULFATE (PF) 4 MG/ML IV SOLN
4.0000 mg | Freq: Once | INTRAVENOUS | Status: AC
  Administered 2018-07-14: 2 mg via INTRAVENOUS
  Filled 2018-07-14: qty 1

## 2018-07-14 NOTE — ED Notes (Signed)
Pt returned from ultrasound

## 2018-07-14 NOTE — Discharge Instructions (Addendum)
You have been seen in the Emergency Department (ED) for abdominal pain.  Your evaluation did not identify a clear cause of your symptoms but was generally reassuring.  Your troponin was slightly elevated, which in some cases can indicate an issue with your heart, but we discussed your situation with Dr. Saralyn Pilar (cardiology) and he agreed that this elevated blood test is unlikely to represent a heart problem and it is okay for you to follow up as an outpatient.  Please follow up as instructed above regarding today?s emergent visit and the symptoms that are bothering you.  Return to the ED if your abdominal pain worsens or fails to improve, you develop bloody vomiting, bloody diarrhea, you are unable to tolerate fluids due to vomiting, fever greater than 101, or other symptoms that concern you.

## 2018-07-14 NOTE — ED Notes (Signed)
Pt to ultrasound

## 2018-07-14 NOTE — ED Provider Notes (Signed)
[facility] Emergency Department Provider Note  ____________________________________________   First MD Initiated Contact with Patient 07/14/18 [phone]     (approximate)  I have reviewed the triage vital signs and the nursing notes.   HISTORY  Chief Complaint Abdominal Pain    HPI Brandi Bright is a 47 y.o. 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  She presents by private vehicle for evaluation of epigastric pain that has been gradually worsening over the last 2 days.  She describes it as sharp and stabbing and nothing particular makes it better or worse.  She started to have some left arm numbness today but that has resolved.  She has had nausea and vomiting for several days as well and is having some lower abdominal pain.  She has had no fevers but has had some subjective chills.  She denies shortness of breath and chest pain.  She denies diarrhea and constipation.  Nothing in particular makes her symptoms better or worse.  Past Medical History:  Diagnosis Date  . Arthritis   . Back pain   . Gout   . Hypothyroidism   . MI, old   . Thyroid disease     Patient Active Problem List   Diagnosis Date Noted  . Colon cancer screening   . Benign neoplasm of descending colon   . Benign neoplasm of ascending colon   . Family history of malignant neoplasm of gastrointestinal tract   . Benign neoplasm of transverse colon   . Polyp of sigmoid colon   . Chest pain 12/29/2017    Past Surgical History:  Procedure Laterality Date  . CARDIAC CATHETERIZATION    . CESAREAN SECTION    . COLONOSCOPY WITH PROPOFOL N/A 03/16/2018   Procedure: COLONOSCOPY WITH PROPOFOL;  Surgeon: Virgel Manifold, MD;  Location: [facility] ENDOSCOPY;  Service: Endoscopy;  Laterality: N/A;  . TUBAL  LIGATION      Prior to Admission medications   Medication Sig Start Date End Date Taking? Authorizing Provider  carvedilol (COREG) 3.125 MG tablet Take 3.125 mg by mouth daily.    [provider]  levothyroxine (SYNTHROID, LEVOTHROID) 200 MCG tablet Take 200 mcg by mouth daily. 11/26/17   [provider]  nitroGLYCERIN (NITROSTAT) 0.4 MG SL tablet Place 0.4 mg under the tongue every 5 (five) minutes x 3 doses as needed for chest pain.    [provider]  oxyCODONE-acetaminophen (PERCOCET) 5-325 MG tablet Take 1 tablet by mouth every 6 (six) hours as needed. Patient taking differently: Take 1 tablet by mouth 3 (three) times daily.  02/04/16   Drenda Freeze, MD  rOPINIRole (REQUIP) 2 MG tablet Take 2 mg by mouth at bedtime.    [provider]  traZODone (DESYREL) 50 MG tablet Take 50 mg by mouth at bedtime.    [provider]    Allergies Amoxicillin; Gabapentin; and Vicodin [hydrocodone-acetaminophen]  Family History  Problem Relation Age of Onset  . Hypertension Other   . CAD Father     Social History Social History   Tobacco Use  . Smoking status: Never Smoker  . Smokeless tobacco: Never Used  Substance Use Topics  . Alcohol use: Not Currently    Comment: occasionally  . Drug use: Never    Review of Systems Constitutional: No  fever/chills Eyes: No visual changes. ENT: No sore throat. Cardiovascular: Denies chest pain. Respiratory: Denies shortness of breath. Gastrointestinal: Abdominal pain as listed above as well as nausea and vomiting.  Some lower abdominal pain as well. Genitourinary: Negative for dysuria. Musculoskeletal: Negative for neck pain.  Negative for back pain. Integumentary: Negative for rash. Neurological: Transient left arm numbness.  No other neurological complaints.   ____________________________________________   PHYSICAL EXAM:  VITAL SIGNS: ED Triage Vitals  Enc Vitals Group     BP 07/13/18  2248 124/89     Pulse Rate 07/13/18 2248 87     Resp 07/13/18 2248 16     Temp 07/13/18 2248 98.6 F (37 C)     Temp Source 07/13/18 2248 Oral     SpO2 07/13/18 2248 98 %     Weight 07/13/18 2248 78 kg (172 lb)     Height 07/13/18 2248 1.448 m (4\' 9" )     Head Circumference --      Peak Flow --      Pain Score 07/13/18 2253 10     Pain Loc --      Pain Edu? --      Excl. in [place]? --     Constitutional: Alert and oriented.  Appears to have a significant degree of chronic illness for her age but is not in acute distress Eyes: Conjunctivae are normal.  Head: Atraumatic. Nose: No congestion/rhinnorhea. Mouth/Throat: Mucous membranes are moist. Neck: No stridor.  No meningeal signs.   Cardiovascular: Normal rate, regular rhythm. Good peripheral circulation. Grossly normal heart sounds. Respiratory: Normal respiratory effort.  No retractions. Lungs CTAB. Gastrointestinal: Soft and nondistended.  Tender to palpation of the epigastrium and right upper quadrant but also tender palpation of the lower abdomen.  No rebound or guarding. Musculoskeletal: No lower extremity tenderness nor edema. No gross deformities of extremities. Neurologic:  Normal speech and language. No gross focal neurologic deficits are appreciated.  Skin:  Skin is warm, dry and intact. No rash noted. Psychiatric: Mood and affect are normal. Speech and behavior are normal.  ____________________________________________   LABS (all labs ordered are listed, but only abnormal results are displayed)  Labs Reviewed  COMPREHENSIVE METABOLIC PANEL - Abnormal; Notable for the following components:      Result Value   Glucose, Bld 110 (*)    Calcium 8.3 (*)    Albumin 3.2 (*)    AST 150 (*)    ALT 79 (*)    Total Bilirubin 1.3 (*)    All other components within normal limits  CBC - Abnormal; Notable for the following components:   WBC 3.5 (*)    Platelets 54 (*)    All other components within normal limits  URINALYSIS,  COMPLETE (UACMP) WITH MICROSCOPIC - Abnormal; Notable for the following components:   Color, Urine YELLOW (*)    APPearance CLEAR (*)    Leukocytes, UA TRACE (*)    All other components within normal limits  TROPONIN I - Abnormal; Notable for the following components:   Troponin I 0.04 (*)    All other components within normal limits  TROPONIN I - Abnormal; Notable for the following components:   Troponin I 0.05 (*)    All other components within normal limits  LIPASE, BLOOD  POCT PREGNANCY, URINE   ____________________________________________  EKG  ED ECG REPORT I, Hinda Kehr, the attending physician, personally viewed and interpreted this ECG.  Date: 07/13/2018 EKG Time: 22: 53 Rate: 84 Rhythm: normal sinus  rhythm QRS Axis: normal Intervals: normal ST/T Wave abnormalities: Non-specific ST segment / T-wave changes, but no evidence of acute ischemia. Narrative Interpretation: no evidence of acute ischemia   ____________________________________________  RADIOLOGY  ED MD interpretation: Cirrhosis with portal vein enlargement consistent with portal hypertension but normal flow.  Contracted gallbladder but no evidence of cholecystitis. No acute abnormalities identified on CT abd/pelvis.  Official radiology report(s): Ct Abdomen Pelvis W Contrast  Result Date: 07/14/2018 CLINICAL DATA:  Nausea and vomiting.  Epigastric abdominal pain. EXAM: CT ABDOMEN AND PELVIS WITH CONTRAST TECHNIQUE: Multidetector CT imaging of the abdomen and pelvis was performed using the standard protocol following bolus administration of intravenous contrast. CONTRAST:  63mL OMNIPAQUE IOHEXOL 300 MG/ML  SOLN COMPARISON:  CT 12/29/2017 FINDINGS: Lower chest: No pleural fluid or consolidation. Hepatobiliary: Cirrhotic hepatic morphology with nodular contours. No discrete lesion. Gallbladder partially distended with mild wall thickening, likely secondary. Enlarged portal veins with recannulization of the  umbilical vein. Pancreas: Mild generalized fat stranding of the upper abdomen, not discretely centered on the pancreas. Spleen: Marked splenomegaly with spleen measuring 18.5 x 13.8 x 8.1 cm (volume = 1100 cm^3). Adrenals/Urinary Tract: Normal adrenal glands. No hydronephrosis or perinephric edema. Homogeneous renal enhancement with symmetric excretion on delayed phase imaging. Small cyst in the mid right kidney. Urinary bladder is distended without wall thickening. Stomach/Bowel: Perigastric and paraesophageal varices. Stomach is nondistended. Mild wall thickening of the cecum and proximal ascending colon suggesting portal colopathy. No bowel obstruction. Vascular/Lymphatic: Portal hypertension with dilated portal veins, recannulated umbilical vein, dilated splenic vein and tortuosity. Prominent paraesophageal and perigastric varices. Multiple upper abdominal collaterals. Mild aortic atherosclerosis without aneurysm. Small mesenteric and periportal nodes not enlarged by size criteria. Reproductive: Uterus and bilateral adnexa are unremarkable. Other: Small volume abdominopelvic ascites. Generalized soft tissue stranding about the upper abdomen, with edema tracking into both pericolic gutters. No free air. Musculoskeletal: There are no acute or suspicious osseous abnormalities. IMPRESSION: 1. Cirrhosis with portal hypertension. Generalized stranding about the upper abdomen is increased from prior exam, however is not centered on single entity, suggesting this may be secondary to cirrhosis. 2. Portal hypertension manifested by prominent splenomegaly, enlarged portal veins with recannulated umbilical vein, paraesophageal and perigastric varices. Mild wall thickening of the cecum and ascending colon may be portal colopathy. 3.  Aortic Atherosclerosis (ICD10-I70.0). Electronically Signed   By: Jeb Levering M.D.   On: 07/14/2018 05:45   US Abdomen Limited Ruq  Result Date: 07/14/2018 CLINICAL DATA:  Epigastric  pain for 3 days.  Elevated LFTs. EXAM: ULTRASOUND ABDOMEN LIMITED RIGHT UPPER QUADRANT COMPARISON:  CT 12/29/2017 FINDINGS: Gallbladder: Contracted with possible intraluminal sludge or small stones. Mild diffuse gallbladder wall thickening of 5 mm. No pericholecystic fluid. No sonographic Murphy sign noted by sonographer. Common bile duct: Diameter: 3 mm, normal. Liver: Nodular hepatic contours consistent with cirrhosis. No focal lesion identified. Slightly heterogeneous in parenchymal echogenicity. Portal vein is patent on color Doppler imaging with normal direction of blood flow towards the liver. Portal vein appears prominent caliber measuring 1.6 cm in dimension. Recannulated umbilical vein is seen on prior CT. No perihepatic ascites. IMPRESSION: 1. Cirrhosis. 2. Portal vein is enlarged consistent with portal hypertension but retains normal directional flow. Recannulated umbilical vein. 3. Contracted gallbladder with possible small stones or sludge. Gallbladder wall thickening likely related to nondistention and/or chronic liver disease. Electronically Signed   By: Jeb Levering M.D.   On: 07/14/2018 03:37    ____________________________________________   PROCEDURES  Critical Care performed:  No   Procedure(s) performed:   Procedures   ____________________________________________   INITIAL IMPRESSION / ASSESSMENT AND PLAN / ED COURSE  As part of my medical decision making, I reviewed the following data within the [facility] notes reviewed and incorporated, Labs reviewed , EKG interpreted , Old chart reviewed and Discussed with cardiology by phone (Dr. Saralyn Pilar).    Differential diagnosis includes, but is not limited to, biliary colic, portal vein thrombosis, acute intra-abdominal abscess or infection including appendicitis, mesenteric ischemia, etc.  Less likely ACS or aortic dissection.  I suspect the pain is related the patient's chronic liver disease.  I  initially evaluated with an ultrasound but did not find any definitive answers and the patient is still reporting tenderness to palpation of the abdomen.  I am treating with morphine 4 mg IV and Zofran 4 mg IV and will obtain a CT of the abdomen and pelvis with IV contrast for further evaluation and to make sure that there is no evidence of any acute emergent condition explaining her pain.  Labs are notable for elevated LFTs but this may be her baseline and not represent a new issue.  I have ordered a troponin is well but I strongly do not think that this represents ACS given the other GI symptoms she is experiencing.  Urine pregnancy is negative, lipase is normal, CBC is normal except for a very slightly decreased WBC at 3.5.  Urinalysis is normal.  Vital signs been stable.  Clinical Course as of Jul 14 634  Thu Jul 14, 6388  3734 Uncertain significance of slight elevation; test was run on blood sample that was drawn nearly 7 hours ago.  A second sample has been sent and is running; I do not feel it is likely that this slight elevation represents ACS.  Troponin I(!!): 0.04 [CF]  0557 No acute abnormalities on CT scan of the abdomen and pelvis.  She has been sleeping and is in no acute distress at this time.  Updated patient with plan for discharge and GI follow up if second troponin unchanged or improved.  She understands plan.  CT ABDOMEN PELVIS W CONTRAST [CF]  0630 I discussed the slightly abnormal troponin with Dr. Saralyn Pilar of the cardiology service.  I explained my low suspicion for ACS but the constellation of symptoms the patient was describing.  He was not at all concerned by the elevated troponin, pointing out that even over an extended period of time (more than 6 hours) between the 2 troponins, and that there was no peak or trough and that it was remaining stable.  We are not certain why the troponin is slightly abnormal but it is appropriate for discharge and outpatient follow-up,  particularly in the setting of no chest pain and no concerning EKG changes.  I have provided follow-up information for the patient and gave my usual customary return precautions.  Troponin I(!!): 0.05 [CF]    Clinical Course User Index [CF] Hinda Kehr, MD    ____________________________________________  FINAL CLINICAL IMPRESSION(S) / ED DIAGNOSES  Final diagnoses:  Generalized abdominal pain  Elevated troponin I level     MEDICATIONS GIVEN DURING THIS VISIT:  Medications  morphine 4 MG/ML injection 4 mg (2 mg Intravenous Given 07/14/18 0434)  ondansetron (ZOFRAN) injection 4 mg (4 mg Intravenous Given 07/14/18 0434)  iohexol (OMNIPAQUE) 300 MG/ML solution 75 mL (75 mLs Intravenous Contrast Given 07/14/18 0501)     ED Discharge Orders    None  Note:  This document was prepared using Dragon voice recognition software and may include unintentional dictation errors.    Hinda Kehr, MD 07/14/18 [phone]

## 2018-07-14 NOTE — ED Notes (Signed)
Pt up to restroom. Reports increase in pain with ambulation. Pt now resting on stretcher with eyes closed. No increased work of breathing or distress noted.

## 2018-07-29 DIAGNOSIS — I10 Essential (primary) hypertension: Secondary | ICD-10-CM | POA: Diagnosis not present

## 2018-07-29 DIAGNOSIS — B188 Other chronic viral hepatitis: Secondary | ICD-10-CM | POA: Diagnosis not present

## 2018-07-29 DIAGNOSIS — E034 Atrophy of thyroid (acquired): Secondary | ICD-10-CM | POA: Diagnosis not present

## 2018-07-29 DIAGNOSIS — R5381 Other malaise: Secondary | ICD-10-CM | POA: Diagnosis not present

## 2018-08-05 DIAGNOSIS — K7469 Other cirrhosis of liver: Secondary | ICD-10-CM | POA: Diagnosis not present

## 2018-08-05 DIAGNOSIS — I252 Old myocardial infarction: Secondary | ICD-10-CM | POA: Diagnosis not present

## 2018-08-05 DIAGNOSIS — B182 Chronic viral hepatitis C: Secondary | ICD-10-CM | POA: Diagnosis not present

## 2018-08-05 DIAGNOSIS — M539 Dorsopathy, unspecified: Secondary | ICD-10-CM | POA: Diagnosis not present

## 2018-08-22 DIAGNOSIS — M542 Cervicalgia: Secondary | ICD-10-CM | POA: Diagnosis not present

## 2018-08-22 DIAGNOSIS — M545 Low back pain: Secondary | ICD-10-CM | POA: Diagnosis not present

## 2018-08-22 DIAGNOSIS — G894 Chronic pain syndrome: Secondary | ICD-10-CM | POA: Diagnosis not present

## 2018-08-22 DIAGNOSIS — Z79891 Long term (current) use of opiate analgesic: Secondary | ICD-10-CM | POA: Diagnosis not present

## 2018-08-31 ENCOUNTER — Encounter: Payer: Self-pay | Admitting: Gastroenterology

## 2018-08-31 ENCOUNTER — Ambulatory Visit: Payer: Medicaid Other | Admitting: Gastroenterology

## 2018-08-31 VITALS — BP 107/71 | HR 83 | Ht 59.0 in | Wt 183.6 lb

## 2018-08-31 DIAGNOSIS — B182 Chronic viral hepatitis C: Secondary | ICD-10-CM

## 2018-08-31 DIAGNOSIS — K7469 Other cirrhosis of liver: Secondary | ICD-10-CM

## 2018-09-01 LAB — PROTIME-INR
INR: 1.3 — ABNORMAL HIGH (ref 0.8–1.2)
PROTHROMBIN TIME: 13.2 s — AB (ref 9.1–12.0)

## 2018-09-02 ENCOUNTER — Telehealth: Payer: Self-pay

## 2018-09-02 ENCOUNTER — Other Ambulatory Visit: Payer: Self-pay

## 2018-09-02 DIAGNOSIS — R1084 Generalized abdominal pain: Secondary | ICD-10-CM

## 2018-09-02 LAB — COMPREHENSIVE METABOLIC PANEL
A/G RATIO: 0.9 — AB (ref 1.2–2.2)
ALT: 73 IU/L — AB (ref 0–32)
AST: 141 IU/L — AB (ref 0–40)
Albumin: 3.2 g/dL — ABNORMAL LOW (ref 3.5–5.5)
Alkaline Phosphatase: 104 IU/L (ref 39–117)
BUN/Creatinine Ratio: 19 (ref 9–23)
BUN: 9 mg/dL (ref 6–24)
Bilirubin Total: 0.9 mg/dL (ref 0.0–1.2)
CO2: 23 mmol/L (ref 20–29)
Calcium: 8.5 mg/dL — ABNORMAL LOW (ref 8.7–10.2)
Chloride: 106 mmol/L (ref 96–106)
Creatinine, Ser: 0.48 mg/dL — ABNORMAL LOW (ref 0.57–1.00)
GFR, EST AFRICAN AMERICAN: 134 mL/min/{1.73_m2} (ref >59)
GFR, EST NON AFRICAN AMERICAN: 116 mL/min/{1.73_m2} (ref >59)
GLOBULIN, TOTAL: 3.4 g/dL (ref 1.5–4.5)
Glucose: 95 mg/dL (ref 65–99)
POTASSIUM: 4.1 mmol/L (ref 3.5–5.2)
Sodium: 139 mmol/L (ref 134–144)
TOTAL PROTEIN: 6.6 g/dL (ref 6.0–8.5)

## 2018-09-02 LAB — HEPATITIS B SURFACE ANTIGEN: HEP B S AG: NEGATIVE

## 2018-09-02 LAB — CBC
Hematocrit: 33.6 % — ABNORMAL LOW (ref 34.0–46.6)
Hemoglobin: 11.2 g/dL (ref 11.1–15.9)
MCH: 27.8 pg (ref 26.6–33.0)
MCHC: 33.3 g/dL (ref 31.5–35.7)
MCV: 83 fL (ref 79–97)
PLATELETS: 57 10*3/uL — AB (ref 150–450)
RBC: 4.03 x10E6/uL (ref 3.77–5.28)
RDW: 12.4 % (ref 12.3–15.4)
WBC: 2.1 10*3/uL — CL (ref 3.4–10.8)

## 2018-09-02 LAB — HEPATITIS B DNA, ULTRAQUANTITATIVE, PCR: HBV DNA SERPL PCR-ACNC: NOT DETECTED IU/mL

## 2018-09-02 LAB — HCV RNA QUANT
HCV LOG10: 5.613 {Log_IU}/mL
Hepatitis C Quantitation: 410000 IU/mL

## 2018-09-02 LAB — MITOCHONDRIAL/SMOOTH MUSCLE AB PNL
Mitochondrial Ab: <20 Units (ref 0.0–20.0)
Smooth Muscle Ab: 10 Units (ref 0–19)

## 2018-09-02 LAB — HEPATITIS C ANTIBODY (REFLEX)

## 2018-09-02 LAB — HEPATITIS B E ANTIGEN: Hep B E Ag: NEGATIVE

## 2018-09-02 LAB — HEPATITIS B CORE ANTIBODY, TOTAL: HEP B C TOTAL AB: NEGATIVE

## 2018-09-02 LAB — ALPHA-1 ANTITRYPSIN PHENOTYPE: A1 ANTITRYPSIN: 151 mg/dL (ref 90–200)

## 2018-09-02 LAB — HEPATITIS A ANTIBODY, TOTAL: Hep A Total Ab: POSITIVE — AB

## 2018-09-02 LAB — CERULOPLASMIN: Ceruloplasmin: 18.5 mg/dL — ABNORMAL LOW (ref 19.0–39.0)

## 2018-09-02 LAB — COMMENT2 - HEP PANEL

## 2018-09-02 LAB — HEPATITIS B SURFACE ANTIBODY,QUALITATIVE: Hep B Surface Ab, Qual: REACTIVE

## 2018-09-02 LAB — HEPATITIS A ANTIBODY, IGM: HEP A IGM: NEGATIVE

## 2018-09-02 LAB — FERRITIN: Ferritin: 14 ng/mL — ABNORMAL LOW (ref 15–150)

## 2018-09-02 NOTE — Telephone Encounter (Signed)
Cardiac clearance sent for approval via e-fax and faxed via fax machine. Also requested report of last echo.

## 2018-09-08 NOTE — Progress Notes (Signed)
[name]  [place], [place] [address]  Main: [phone]  Fax: [phone]   Gastroenterology Consultation  Referring Provider:     Cletis Athens, MD Primary Care Physician:  Cletis Athens, MD Primary Gastroenterologist:  Dr. [name] Reason for Consultation:     Evaluation for cirrhosis        HPI:    Chief Complaint  Patient presents with  . New Patient (Initial Visit)    referred by Dr. Lavera Guise for Non-alcoholic Cirrhosis    MANDEEP FERCH is a 47 y.o. y/o female referred for consultation & management  by Dr. Cletis Athens, MD.  Patient states she was recently informed that she may have cirrhosis, denies any previous history of such.  Denies any previous history of confusion or bleeding.  Reports occasional alcohol use and denies any previous daily alcohol use.  No prior EGD.  Had a screening colonoscopy in April 2019 with less than 1 cm polyp removed and repeat recommended in 3 years.  Previous lab work shows chronically low platelets, with last CBC from July 2019 showing platelets of 54.  CMP from July 2019 also shows elevated transaminases with AST higher than ALT.  CT abdomen shows cirrhotic hepatic morphology with no discrete liver lesions.  Splenomegaly, enlarged portal veins with recannulated umbilical vein, paraesophageal and perigastric varices also reported.  Mild wall thickening of the cecum and ascending colon is also reported with notation "may be portal colopathy".  Patient denies any previous history of hepatitis  Past Medical History:  Diagnosis Date  . Arthritis   . Back pain   . Gout   . Hypothyroidism   . MI, old   . Thyroid disease     Past Surgical History:  Procedure Laterality Date  . CARDIAC CATHETERIZATION    . CESAREAN SECTION    . COLONOSCOPY WITH PROPOFOL N/A 03/16/2018   Procedure: COLONOSCOPY WITH PROPOFOL;  Surgeon: Virgel Manifold, MD;  Location: [facility] ENDOSCOPY;  Service: Endoscopy;   Laterality: N/A;  . TUBAL LIGATION      Prior to Admission medications   Medication Sig Start Date End Date Taking? Authorizing Provider  carvedilol (COREG) 3.125 MG tablet Take 3.125 mg by mouth daily.   Yes [provider]  levothyroxine (SYNTHROID, LEVOTHROID) 200 MCG tablet Take 200 mcg by mouth daily. 11/26/17  Yes [provider]  nitroGLYCERIN (NITROSTAT) 0.4 MG SL tablet Place 0.4 mg under the tongue every 5 (five) minutes x 3 doses as needed for chest pain.   Yes [provider]  omeprazole (PRILOSEC) 20 MG capsule Take by mouth daily. 07/26/18  Yes [provider]  oxyCODONE-acetaminophen (PERCOCET) 5-325 MG tablet Take 1 tablet by mouth every 6 (six) hours as needed. Patient taking differently: Take 1 tablet by mouth 3 (three) times daily.  02/04/16  Yes Drenda Freeze, MD  rOPINIRole (REQUIP) 2 MG tablet Take 2 mg by mouth at bedtime.   Yes [provider]  spironolactone (ALDACTONE) 50 MG tablet Take 50 mg by mouth daily. 08/21/18  Yes [provider]  traZODone (DESYREL) 50 MG tablet Take 50 mg by mouth at bedtime.   Yes [provider]  venlafaxine XR (EFFEXOR-XR) 75 MG 24 hr capsule Take by mouth daily. 08/22/18  Yes [provider]    Family History  Problem Relation Age of Onset  . Hypertension Other   . CAD Father      Social History   Tobacco  Use  . Smoking status: Never Smoker  . Smokeless tobacco: Never Used  Substance Use Topics  . Alcohol use: Not Currently    Comment: occasionally  . Drug use: Never    Allergies as of 08/31/2018 - Review Complete 08/31/2018  Allergen Reaction Noted  . Amoxicillin Rash and Other (See Comments) 01/11/2016  . Gabapentin Rash 01/11/2016  . Vicodin [hydrocodone-acetaminophen] Rash 01/11/2016    Review of Systems:    All systems reviewed and negative except where noted in HPI.   Physical Exam:  BP 107/71   Pulse 83   Ht 4\' 11"  (1.499 m)   Wt 183  lb 9.6 oz (83.3 kg)   BMI 37.08 kg/m  No LMP recorded. Patient is postmenopausal. Psych:  Alert and cooperative. Normal mood and affect. General:   Alert,  Well-developed, well-nourished, pleasant and cooperative in NAD Head:  Normocephalic and atraumatic. Eyes:  Sclera clear, no icterus.   Conjunctiva pink. Ears:  Normal auditory acuity. Nose:  No deformity, discharge, or lesions. Mouth:  No deformity or lesions,oropharynx pink & moist. Neck:  Supple; no masses or thyromegaly. Abdomen:  Normal bowel sounds.  No bruits.  Soft, non-tender and non-distended without masses, hepatosplenomegaly or hernias noted.  No guarding or rebound tenderness.    Msk:  Symmetrical without gross deformities. Good, equal movement & strength bilaterally. Pulses:  Normal pulses noted. Extremities:  No clubbing or edema.  No cyanosis. Neurologic:  Alert and oriented x3;  grossly normal neurologically. Skin:  Intact without significant lesions or rashes. No jaundice. Lymph Nodes:  No significant cervical adenopathy. Psych:  Alert and cooperative. Normal mood and affect.   Labs: CBC    Component Value Date/Time   WBC 2.1 (LL) 08/31/2018 1444   WBC 3.5 (L) 07/13/2018 2257   RBC 4.03 08/31/2018 1444   RBC 4.07 07/13/2018 2257   HGB 11.2 08/31/2018 1444   HCT 33.6 (L) 08/31/2018 1444   PLT 57 (LL) 08/31/2018 1444   MCV 83 08/31/2018 1444   MCV 84 08/25/2013 0617   MCH 27.8 08/31/2018 1444   MCH 30.2 07/13/2018 2257   MCHC 33.3 08/31/2018 1444   MCHC 33.9 07/13/2018 2257   RDW 12.4 08/31/2018 1444   RDW 15.2 (H) 08/25/2013 0617   LYMPHSABS 0.7 (L) 12/28/2017 2146   LYMPHSABS 1.1 08/25/2013 0617   MONOABS 0.2 12/28/2017 2146   MONOABS 0.2 08/25/2013 0617   EOSABS 0.0 12/28/2017 2146   EOSABS 0.1 08/25/2013 0617   BASOSABS 0.0 12/28/2017 2146   BASOSABS 0.0 08/25/2013 0617   CMP     Component Value Date/Time   NA 139 08/31/2018 1444   NA 137 08/25/2013 0617   K 4.1 08/31/2018 1444   K 4.0  08/25/2013 0617   CL 106 08/31/2018 1444   CL 104 08/25/2013 0617   CO2 23 08/31/2018 1444   CO2 29 08/25/2013 0617   GLUCOSE 95 08/31/2018 1444   GLUCOSE 110 (H) 07/13/2018 2257   GLUCOSE 97 08/25/2013 0617   BUN 9 08/31/2018 1444   BUN 9 08/25/2013 0617   CREATININE 0.48 (L) 08/31/2018 1444   CREATININE 0.68 08/25/2013 0617   CALCIUM 8.5 (L) 08/31/2018 1444   CALCIUM 8.8 08/25/2013 0617   PROT 6.6 08/31/2018 1444   PROT 8.8 (H) 05/30/2013 1242   ALBUMIN 3.2 (L) 08/31/2018 1444   ALBUMIN 4.4 05/30/2013 1242   AST 141 (H) 08/31/2018 1444   AST 163 (H) 05/30/2013 1242   ALT 73 (H) 08/31/2018 1444  ALT 144 (H) 05/30/2013 1242   ALKPHOS 104 08/31/2018 1444   ALKPHOS 89 05/30/2013 1242   BILITOT 0.9 08/31/2018 1444   BILITOT 0.8 05/30/2013 1242   GFRNONAA 116 08/31/2018 1444   GFRNONAA >60 08/25/2013 0617   GFRAA 134 08/31/2018 1444   GFRAA >60 08/25/2013 0617    Imaging Studies: No results found.  Assessment and Plan:   LYNDY RUSSMAN is a 47 y.o. y/o female has been referred for evaluation for cirrhosis due to imaging reporting cirrhosis  I obtained blood work for the patient today Her low platelets, and imaging findings with and varices on imaging is all consistent with underlying cirrhosis Meld 9 on blood work today August 31, 2018  Hepatitis C RNA quant is 410,000 and antibody positive She will need genotype testing, fibrosure and treatment for hepatitis C  Hepatitis B serologies negative Ceruloplasmin is mildly decreased, will repeat in the future this is unlikely to be the cause of her cirrhosis  Ferritin is low, will refer to hematology for iron deficiency, low platelets and low white cell count  Colonoscopy is complete, and CT reporting thickening in the cecum and ascending colon is not consistent with any findings in the colon and is likely nonspecific in the absence of symptoms in the absence of such findings  Patient is scheduled for an EGD to evaluate  for need for variceal banding, and iron deficiency If this is negative for etiology of iron deficiency new patient will need small bowel capsule  Patient asked to abstain from any hepatotoxic drugs including alcohol and she verbalized understanding  Hepatitis A and B antibodies consistent with immunity  Dr [name]

## 2018-09-12 ENCOUNTER — Other Ambulatory Visit: Payer: Self-pay

## 2018-09-12 ENCOUNTER — Telehealth: Payer: Self-pay

## 2018-09-12 DIAGNOSIS — D509 Iron deficiency anemia, unspecified: Secondary | ICD-10-CM

## 2018-09-12 DIAGNOSIS — D708 Other neutropenia: Secondary | ICD-10-CM

## 2018-09-12 NOTE — Telephone Encounter (Signed)
-----   Message from Virgel Manifold, MD sent at 09/08/2018  1:38 PM EDT ----- Jackelyn Poling please let patient know, her hepatitis C was positive.  She will need more blood work to see what type of hepatitis C she has and to begin treatment.  I am forwarding this to Ginger as well. Please refer her to hematology for iron deficiency, and low white cell count

## 2018-09-12 NOTE — Telephone Encounter (Signed)
Pt notified of testing for positive Hep C results and of treatment. When medication is approved we will contact pt. Also will try to come by office on 09/15/18 after appt with Dr Lavera Guise to have lab drawn. I also asked pt to be sure to let Dr. Lavera Guise know that we do need cardiac clearance for her EGD, scheduled on 09/21/2018 and also requested her last echo report on 09/02/18. She is aware if we do not get clearance she will not be able to get EGD done. Also informed pt of referral to hematology and they will contact her for an appointment.

## 2018-09-15 DIAGNOSIS — M539 Dorsopathy, unspecified: Secondary | ICD-10-CM | POA: Diagnosis not present

## 2018-09-15 DIAGNOSIS — I252 Old myocardial infarction: Secondary | ICD-10-CM | POA: Diagnosis not present

## 2018-09-15 DIAGNOSIS — B182 Chronic viral hepatitis C: Secondary | ICD-10-CM | POA: Diagnosis not present

## 2018-09-15 DIAGNOSIS — K7469 Other cirrhosis of liver: Secondary | ICD-10-CM | POA: Diagnosis not present

## 2018-09-19 ENCOUNTER — Telehealth: Payer: Self-pay

## 2018-09-19 NOTE — Telephone Encounter (Signed)
Pt informed cardiac clearance was given. She also has an appt on Friday, 09/23/2018 with hematology.

## 2018-09-21 ENCOUNTER — Encounter: Admission: RE | Payer: Self-pay | Source: Ambulatory Visit

## 2018-09-21 ENCOUNTER — Ambulatory Visit: Admission: RE | Admit: 2018-09-21 | Payer: Medicaid Other | Source: Ambulatory Visit | Admitting: Gastroenterology

## 2018-09-21 DIAGNOSIS — Z1211 Encounter for screening for malignant neoplasm of colon: Secondary | ICD-10-CM

## 2018-09-21 SURGERY — ESOPHAGOGASTRODUODENOSCOPY (EGD) WITH PROPOFOL
Anesthesia: General

## 2018-09-22 NOTE — Progress Notes (Signed)
[facility] day:  09/23/2018  Chief Complaint: Brandi Bright is a 47 y.o. female with iron deficiency anemia, thrombocytopenia, and leukopenia who is referred in consultation by Dr. Bonna Gains for assessment and management.  HPI:  The patient has cirrhosis. Recent testing confirmed + hepatitis C. Patient notes that she had a blood transfusion in the early 2000s.   Colonoscopy on 03/16/2018 by Dr. Bonna Gains revealed non-thrombosed external hemorrhoids, four 3 to 5 mm polyps in the transverse colon and in the ascending colon, one 2 mm polyp in the transverse colon, one 7 mm polyp in the descending colon, and one 4 mm polyp in the sigmoid colon.  Pathology revealed 2 tubular adenomas and 2 hyperplastic polyps.  Repeat colonoscopy is planned for 3 years.  Abdomen and pelvic CT on 07/14/2018 revealed cirrhosis with portal hypertension.  There was prominent splenomegaly (18.5 x 13.8 x 8.1 cm; volume 1100 cm3), enlarged portal veins with recannulated umbilical vein, paraesophageal and perigastric varices.  There was mild thickening of the cecum and ascending colon.  Work-up on 08/31/2018 revealed  + hepatitis C antibody (> 11.0).  Hepatitis C 410,000 IU/ml (5.613 log 10 IU/ml).  Hepatitis B core antibody total was negative.  Hepatitis B  antigen was negative.  Hepatitis B surface antibody was + (immune).  Hepatitis B DNA negative.  Hepatitis A antibody total was + (- IgM).  Creatinine 0.48.  Protein 6.6 (normal), albumin 3.2 (low), AST 141, ALT 73, bilirubin 0.9, and INR 1.3.  Ferritin was 14.  Urinalysis on 07/13/2018 revealed no hematuria.  CBC has been followed: 04/07/2012:  Hematocrit 31.7, hemoglobin 10.2, MCV 78, platelets 88,000, WBC 3100. 05/30/2013:  Hematocrit 42.0, hemoglobin 14.4, MCV 82, platelets 134,000, WBC 4600. 08/25/2013:  Hematocrit 32.3, hemoglobin 11.0, MCV 84, platelets 78,000, WBC 3000 with an ANC of 1600. 02/04/2016:  Hematocrit 37.1,  hemoglobin 12.5, MCV 86.2, platelets 59,000, WBC 2100. 12/28/2017:  Hematocrit 34.7, hemoglobin 11.6, MCV 86.4, platelets 56,000, WBC 2100 with an ANC of 1200. 08/31/2018:  Hematocrit 33.6, hemoglobin 11.2, MCV 83, platelets 57,000, WBC 2100.  She was scheduled for EGD on 09/21/2018, however procedure was rescheduled. Patient does not have a procedure date at this time.   Patient complains of gingival bleeding and some minor epistaxis. Patient denies that she has experienced any B symptoms. She denies any interval infections. She has intermittent issues with blurred vision. She has chronic exertional dyspnea and nausea. She takes omeprazole, which helps with her nausea. Patient has polyarthralgia secondary to known osteoarthritis. She takes oxycodone for chronic back pain.    She took iron pills in 2006 when she was pregnant.  She received a PRBC transfusion in 1991/1992 secondary to heavy menses.  She had no menses since 2011.  She does not eat breakfast.  She will eat a can of ravioli for lunch.  She eats a "good dinner".  Patient maintains a diet rich in iron. She indicates that she eats meat on a consistent basis. She does not eat green leafy vegetables citing that she does not like them. Patient denies ice pica. She has been diagnosed with restless leg syndrome.   Family history is notable for her father with colon and lung cancer, her brother with "cancerous polyps" and prostate cancer, and a sister with an unknown blood disorder.  She has children x 2 - good health   Past Medical History:  Diagnosis Date  . Arthritis   . Back pain   .  Gout   . Hypothyroidism   . MI, old   . Thyroid disease     Past Surgical History:  Procedure Laterality Date  . CARDIAC CATHETERIZATION    . CESAREAN SECTION    . COLONOSCOPY WITH PROPOFOL N/A 03/16/2018   Procedure: COLONOSCOPY WITH PROPOFOL;  Surgeon: Virgel Manifold, MD;  Location: [facility] ENDOSCOPY;  Service: Endoscopy;  Laterality: N/A;  .  TUBAL LIGATION      Family History  Problem Relation Age of Onset  . Hypertension Other   . CAD Father   . Colon cancer Father   . Lung cancer Father   . Pancreatic cancer Brother     Social History:  reports that she has never smoked. She has never used smokeless tobacco. She reports that she drank alcohol. She reports that she does not use drugs.  Patient does not smoke. No ETOH in 5 months (04/2018). Patient is "fighting disability" for back and knee pain. Patient denies known exposures to radiation on toxins. The patient is alone today.  Allergies:  Allergies  Allergen Reactions  . Amoxicillin Rash and Other (See Comments)    Has patient had a PCN reaction causing immediate rash, facial/tongue/throat swelling, SOB or lightheadedness with hypotension: Yes Has patient had a PCN reaction causing severe rash involving mucus membranes or skin necrosis: No Has patient had a PCN reaction that required hospitalization: No Has patient had a PCN reaction occurring within the last 10 years: No If all of the above answers are "NO", then may proceed with Cephalosporin use.   . Gabapentin Rash  . Vicodin [Hydrocodone-Acetaminophen] Rash    Current Medications: Current Outpatient Medications  Medication Sig Dispense Refill  . carvedilol (COREG) 3.125 MG tablet Take 3.125 mg by mouth daily.    Marland Kitchen levothyroxine (SYNTHROID, LEVOTHROID) 200 MCG tablet Take 200 mcg by mouth daily.  8  . nitroGLYCERIN (NITROSTAT) 0.4 MG SL tablet Place 0.4 mg under the tongue every 5 (five) minutes x 3 doses as needed for chest pain.    Marland Kitchen omeprazole (PRILOSEC) 20 MG capsule Take by mouth daily.  4  . oxyCODONE-acetaminophen (PERCOCET) 5-325 MG tablet Take 1 tablet by mouth every 6 (six) hours as needed. (Patient taking differently: Take 1 tablet by mouth 3 (three) times daily. ) 8 tablet 0  . rOPINIRole (REQUIP) 2 MG tablet Take 2 mg by mouth at bedtime.    Marland Kitchen spironolactone (ALDACTONE) 50 MG tablet Take 50 mg by  mouth daily.  5  . traZODone (DESYREL) 50 MG tablet Take 50 mg by mouth at bedtime.    Marland Kitchen venlafaxine XR (EFFEXOR-XR) 75 MG 24 hr capsule Take by mouth daily.  4   No current facility-administered medications for this visit.     Review of Systems:  GENERAL:  Feels "ok".  No fevers, sweats or weight loss. PERFORMANCE STATUS (ECOG):  1-2 HEENT:  Intermittent blurred vision.  No runny nose, sore throat, mouth sores or tenderness. Lungs: Shortness of breath with exertion.  No cough.  No hemoptysis. Cardiac:  No chest pain, palpitations, orthopnea, or PND. GI:  Nausea, omeprazole helps.  No vomiting, diarrhea, constipation, melena or hematochezia. GU:  No urgency, frequency, dysuria, or hematuria. Musculoskeletal:  No back pain.  Arthritis in wrists, elbows, neck, and ankles.  No muscle tenderness. Extremities:  No pain or swelling. Skin:  No rashes or skin changes. Neuro:  No headache, numbness or weakness, balance or coordination issues. Endocrine:  No diabetes.  Thyroid issues on Synthroid.  No hot flashes or night sweats. Psych:  No mood changes, depression or anxiety. Pain:  No focal pain. Review of systems:  All other systems reviewed and found to be negative.  Physical Exam: Blood pressure 116/82, pulse 76, temperature 98 F (36.7 C), temperature source Tympanic, resp. rate 18, height 4\' 9"  (1.448 m), weight 176 lb 12.8 oz (80.2 kg), SpO2 98 %. GENERAL:  Well developed, well nourished, woman sitting comfortably in the exam room in no acute distress. MENTAL STATUS:  Alert and oriented to person, place and time. HEAD:  Dark hair pulled back.  Normocephalic, atraumatic, face symmetric, no Cushingoid features. EYES:  Blue eyes.  Pupils equal round and reactive to light and accomodation.  No conjunctivitis or scleral icterus. ENT:  Oropharynx clear without lesion.  Tongue normal. Mucous membranes moist.  RESPIRATORY:  Clear to auscultation without rales, wheezes or  rhonchi. CARDIOVASCULAR:  Regular rate and rhythm without murmur, rub or gallop. ABDOMEN:  Soft, non-tender, with active bowel sounds, and no hepatomegaly.  Spleen palpable on inspiration.  No masses. SKIN:  No rashes, ulcers or lesions. EXTREMITIES: No edema, no skin discoloration or tenderness.  No palpable cords. LYMPH NODES: No palpable cervical, supraclavicular, axillary or inguinal adenopathy  NEUROLOGICAL: Unremarkable. PSYCH:  Appropriate.   No visits with results within 3 Day(s) from this visit.  Latest known visit with results is:  Office Visit on 08/31/2018  Component Date Value Ref Range Status  . Glucose 08/31/2018 95  65 - 99 mg/dL Final  . BUN 08/31/2018 9  6 - 24 mg/dL Final  . Creatinine, Ser 08/31/2018 0.48* 0.57 - 1.00 mg/dL Final  . GFR calc non Af Amer 08/31/2018 116  >59 mL/min/1.73 Final  . GFR calc Af Amer 08/31/2018 134  >59 mL/min/1.73 Final  . BUN/Creatinine Ratio 08/31/2018 19  9 - 23 Final  . Sodium 08/31/2018 139  134 - 144 mmol/L Final  . Potassium 08/31/2018 4.1  3.5 - 5.2 mmol/L Final  . Chloride 08/31/2018 106  96 - 106 mmol/L Final  . CO2 08/31/2018 23  20 - 29 mmol/L Final  . Calcium 08/31/2018 8.5* 8.7 - 10.2 mg/dL Final  . Total Protein 08/31/2018 6.6  6.0 - 8.5 g/dL Final  . Albumin 08/31/2018 3.2* 3.5 - 5.5 g/dL Final  . Globulin, Total 08/31/2018 3.4  1.5 - 4.5 g/dL Final  . Albumin/Globulin Ratio 08/31/2018 0.9* 1.2 - 2.2 Final  . Bilirubin Total 08/31/2018 0.9  0.0 - 1.2 mg/dL Final  . Alkaline Phosphatase 08/31/2018 104  39 - 117 IU/L Final  . AST 08/31/2018 141* 0 - 40 IU/L Final  . ALT 08/31/2018 73* 0 - 32 IU/L Final  . INR 08/31/2018 1.3* 0.8 - 1.2 Final   Comment: Reference interval is for non-anticoagulated patients. Suggested INR therapeutic range for Vitamin K antagonist therapy:    Standard Dose (moderate intensity                   therapeutic range):       2.0 - 3.0    Higher intensity therapeutic range       2.5 - 3.5    . Prothrombin Time 08/31/2018 13.2* 9.1 - 12.0 sec Final  . HCV Ab 08/31/2018 >11.0* 0.0 - 0.9 s/co ratio Final  . Hepatitis B Surface Ag 08/31/2018 Negative  Negative Final  . Hep B Surface Ab, Qual 08/31/2018 Reactive   Final   Comment:  Non Reactive: Inconsistent with immunity,                             less than 10 mIU/mL               Reactive:     Consistent with immunity,                             greater than 9.9 mIU/mL   . Hep B Core Total Ab 08/31/2018 Negative  Negative Final  . Hep A IgM 08/31/2018 Negative  Negative Final  . Hep A Total Ab 08/31/2018 Positive* Negative Final  . Smooth Muscle Ab 08/31/2018 10  0 - 19 Units Final   Comment:                  Negative                     0 - 19                  Weak positive               20 - 30                  Moderate to strong positive     >30  Actin Antibodies are found in 52-85% of patients with  autoimmune hepatitis or chronic active hepatitis and  in 22% of patients with primary biliary cirrhosis.   . Mitochondrial Ab 08/31/2018 <20.0  0.0 - 20.0 Units Final   Comment:                                 Negative    0.0 - 20.0                                 Equivocal  20.1 - 24.9                                 Positive         >24.9 Mitochondrial (M2) Antibodies are found in 90-96% of patients with primary biliary cirrhosis.   . Ceruloplasmin 08/31/2018 18.5* 19.0 - 39.0 mg/dL Final  . Ferritin 08/31/2018 14* 15 - 150 ng/mL Final  . A-1 Antitrypsin 08/31/2018 151  90 - 200 mg/dL Final  . A-1 Antitrypsin Pheno 08/31/2018 MM   Final   Comment:        Phenotype   Population      A-1-AT Concentration                    Incidence %      Reference Interval        MM            86.5%                96 - 189        MS             8.0%                83 - 161        MZ  3.9%                60 - 111        FM             0.4%                93 - 191        SZ             0.3%                42  -  75        SS             0.1%                62 - 119        ZZ             0.05%               16 -  38        FS             0.05%               70 - 128        FZ            Unknown              44 -  88        FF            Unknown              Unknown   . Hepatitis C Quantitation 08/31/2018 410,000  IU/mL Final  . HCV log10 08/31/2018 5.613  log10 IU/mL Final  . Test Information 08/31/2018 Comment   Final   The quantitative range of this assay is 15 IU/mL to 100 million IU/mL.  Marland Kitchen HBV DNA SERPL PCR-ACNC 08/31/2018 HBV DNA not detected  IU/mL Final  . HBV DNA SERPL PCR-LOG IU 08/31/2018 CANCELED  log10 IU/mL Final-Edited   Comment: Unable to calculate result since non-numeric result obtained for component test.  Result canceled by the ancillary.   . Test Information: 08/31/2018 Comment   Final   The reportable range for this assay is 10 IU/mL to 1 billion IU/mL.  Marland Kitchen Hep B E Ag 08/31/2018 Negative  Negative Final  . WBC 08/31/2018 2.1* 3.4 - 10.8 x10E3/uL Final  . RBC 08/31/2018 4.03  3.77 - 5.28 x10E6/uL Final  . Hemoglobin 08/31/2018 11.2  11.1 - 15.9 g/dL Final  . Hematocrit 08/31/2018 33.6* 34.0 - 46.6 % Final  . MCV 08/31/2018 83  79 - 97 fL Final  . MCH 08/31/2018 27.8  26.6 - 33.0 pg Final  . MCHC 08/31/2018 33.3  31.5 - 35.7 g/dL Final  . RDW 08/31/2018 12.4  12.3 - 15.4 % Final  . Platelets 08/31/2018 57* 150 - 450 x10E3/uL Final  . Hematology Comments: 08/31/2018 Note:   Final   Verified by microscopic examination.  Marland Kitchen COMMENT HCV-2 08/31/2018 Comment   Final   Comment: Strong reactive antibody screen (s/c ratio >10.9) is consistent with past or present HCV infection.  Follow-up testing by HCV, Quantitative, Real time PCR (#[id number]) is recommended to determine viral load/diagnosis of current HCV infection.     Assessment:  Brandi Bright is a 47 y.o. female with cirrhosis and hepatitis C.  She has a history of chronic anemia, thrombocytopenia  and leukopenia dating  back to 03/2012.  Platelet count has fluctuated between 54,000 - 56,000 since 12/28/2017 (previously 73,000 - 134,000).  Ferritin was 14 on 08/31/2018.  She eats meat daily.  Abdomen and pelvic CT on 07/14/2018 revealed cirrhosis with portal hypertension noted by prominent splenomegaly (18.5 x 13.8 x 8.1 cm; volume 1100 cm3), enlarged portal veins with recannulated umbilical vein, paraesophageal and perigastric varices.  There was mild thickening of the cecum and ascending colon.  Symptomatically, she denies any B symptoms.  Exam reveals a palpable spleen.  Plan: 1.  Labs today:  CBC with diff, ferritin, iron studies, retic, B12, folate, TSH, free T4, ANA with reflex, copper, SPEP, FLCA, smear review 2.  Mild panyctopenia:  Etiology likely due to hepatitis C with portal hypertension causing splenomegaly.  Iron deficiency documented with ferritin 14, but MCV is normal.  Additional labs ordered.  Serum protein normal with low albumin, thus r/o monoclonal gammopathy.  3.  Iron deficiency anemia:  Plan for upper endoscopy.  Potential source of GI blood loss with portal hypertension (varices).  Discuss trial of oral iron and IV iron if needed. 4.  RTC in 2 weeks for MD assessment, labs (CBC with diff, retic), review of workup, and discussion regarding direction of therapy.    Honor Loh, NP  09/23/2018, 11:51 AM   I saw and evaluated the patient, participating in the key portions of the service and reviewing pertinent diagnostic studies and records.  I reviewed the nurse practitioner's note and agree with the findings and the plan.  The assessment and plan were discussed with the patient.  Multiple questions were asked by the patient and answered.   Nolon Stalls, MD 09/23/2018,11:51 AM

## 2018-09-23 ENCOUNTER — Encounter: Payer: Self-pay | Admitting: Hematology and Oncology

## 2018-09-23 ENCOUNTER — Other Ambulatory Visit: Payer: Self-pay

## 2018-09-23 ENCOUNTER — Inpatient Hospital Stay: Payer: Medicaid Other | Attending: Hematology and Oncology | Admitting: Hematology and Oncology

## 2018-09-23 ENCOUNTER — Inpatient Hospital Stay: Payer: Medicaid Other

## 2018-09-23 VITALS — BP 116/82 | HR 76 | Temp 98.0°F | Resp 18 | Ht <= 58 in | Wt 176.8 lb

## 2018-09-23 DIAGNOSIS — D508 Other iron deficiency anemias: Secondary | ICD-10-CM | POA: Diagnosis not present

## 2018-09-23 DIAGNOSIS — K7469 Other cirrhosis of liver: Secondary | ICD-10-CM

## 2018-09-23 DIAGNOSIS — D509 Iron deficiency anemia, unspecified: Secondary | ICD-10-CM | POA: Insufficient documentation

## 2018-09-23 DIAGNOSIS — I8510 Secondary esophageal varices without bleeding: Secondary | ICD-10-CM | POA: Diagnosis not present

## 2018-09-23 DIAGNOSIS — D72819 Decreased white blood cell count, unspecified: Secondary | ICD-10-CM | POA: Insufficient documentation

## 2018-09-23 DIAGNOSIS — R7989 Other specified abnormal findings of blood chemistry: Secondary | ICD-10-CM

## 2018-09-23 DIAGNOSIS — R040 Epistaxis: Secondary | ICD-10-CM | POA: Diagnosis not present

## 2018-09-23 DIAGNOSIS — R161 Splenomegaly, not elsewhere classified: Secondary | ICD-10-CM

## 2018-09-23 DIAGNOSIS — R110 Nausea: Secondary | ICD-10-CM | POA: Diagnosis not present

## 2018-09-23 DIAGNOSIS — D649 Anemia, unspecified: Secondary | ICD-10-CM

## 2018-09-23 DIAGNOSIS — H538 Other visual disturbances: Secondary | ICD-10-CM | POA: Insufficient documentation

## 2018-09-23 DIAGNOSIS — D61818 Other pancytopenia: Secondary | ICD-10-CM

## 2018-09-23 DIAGNOSIS — D696 Thrombocytopenia, unspecified: Secondary | ICD-10-CM | POA: Insufficient documentation

## 2018-09-23 DIAGNOSIS — I864 Gastric varices: Secondary | ICD-10-CM | POA: Insufficient documentation

## 2018-09-23 DIAGNOSIS — K068 Other specified disorders of gingiva and edentulous alveolar ridge: Secondary | ICD-10-CM

## 2018-09-23 DIAGNOSIS — R000 Tachycardia, unspecified: Secondary | ICD-10-CM | POA: Diagnosis not present

## 2018-09-23 HISTORY — DX: Decreased white blood cell count, unspecified: D72.819

## 2018-09-23 LAB — IRON AND TIBC
IRON: 62 ug/dL (ref 28–170)
Saturation Ratios: 16 % (ref 10.4–31.8)
TIBC: 401 ug/dL (ref 250–450)
UIBC: 339 ug/dL

## 2018-09-23 LAB — CBC WITH DIFFERENTIAL/PLATELET
ABS IMMATURE GRANULOCYTES: 0 10*3/uL (ref 0.00–0.07)
BASOS PCT: 1 %
Basophils Absolute: 0 10*3/uL (ref 0.0–0.1)
Eosinophils Absolute: 0 10*3/uL (ref 0.0–0.5)
Eosinophils Relative: 2 %
HCT: 31 % — ABNORMAL LOW (ref 36.0–46.0)
Hemoglobin: 10.1 g/dL — ABNORMAL LOW (ref 12.0–15.0)
Immature Granulocytes: 0 %
Lymphocytes Relative: 30 %
Lymphs Abs: 0.6 10*3/uL — ABNORMAL LOW (ref 0.7–4.0)
MCH: 28.5 pg (ref 26.0–34.0)
MCHC: 32.6 g/dL (ref 30.0–36.0)
MCV: 87.6 fL (ref 80.0–100.0)
MONO ABS: 0.2 10*3/uL (ref 0.1–1.0)
Monocytes Relative: 8 %
NEUTROS ABS: 1.1 10*3/uL — AB (ref 1.7–7.7)
Neutrophils Relative %: 59 %
PLATELETS: 46 10*3/uL — AB (ref 150–400)
RBC: 3.54 MIL/uL — AB (ref 3.87–5.11)
RDW: 13.2 % (ref 11.5–15.5)
WBC: 1.9 10*3/uL — ABNORMAL LOW (ref 4.0–10.5)
nRBC: 0 % (ref 0.0–0.2)

## 2018-09-23 LAB — RETICULOCYTES
Immature Retic Fract: 1.5 % — ABNORMAL LOW (ref 2.3–15.9)
RBC.: 3.54 MIL/uL — AB (ref 3.87–5.11)
Retic Count, Absolute: 41.4 10*3/uL (ref 19.0–186.0)
Retic Ct Pct: 1.2 % (ref 0.4–3.1)

## 2018-09-23 LAB — FOLATE: Folate: 15.3 ng/mL (ref >5.9)

## 2018-09-23 LAB — FERRITIN: FERRITIN: 10 ng/mL — AB (ref 11–307)

## 2018-09-23 LAB — TSH: TSH: 0.036 u[IU]/mL — AB (ref 0.350–4.500)

## 2018-09-23 LAB — TECHNOLOGIST SMEAR REVIEW

## 2018-09-23 LAB — T4, FREE: Free T4: 1.05 ng/dL (ref 0.82–1.77)

## 2018-09-23 LAB — VITAMIN B12: Vitamin B-12: 315 pg/mL (ref 180–914)

## 2018-09-23 NOTE — Progress Notes (Signed)
Patient here for initial visit. She is very tearful and upset because she is "nervous being here."

## 2018-09-24 LAB — ENA+DNA/DS+SJORGEN'S
ENA SM Ab Ser-aCnc: <0.2 AI (ref 0.0–0.9)
Ribonucleic Protein: <0.2 AI (ref 0.0–0.9)
ds DNA Ab: 13 IU/mL — ABNORMAL HIGH (ref 0–9)

## 2018-09-24 LAB — ANA W/REFLEX: Anti Nuclear Antibody(ANA): POSITIVE — AB

## 2018-09-24 LAB — COPPER, SERUM: COPPER: 69 ug/dL — AB (ref 72–166)

## 2018-09-26 LAB — PROTEIN ELECTROPHORESIS, SERUM
A/G Ratio: 0.8 (ref 0.7–1.7)
ALBUMIN ELP: 2.8 g/dL — AB (ref 2.9–4.4)
ALPHA-1-GLOBULIN: 0.2 g/dL (ref 0.0–0.4)
ALPHA-2-GLOBULIN: 0.5 g/dL (ref 0.4–1.0)
Beta Globulin: 0.8 g/dL (ref 0.7–1.3)
GAMMA GLOBULIN: 1.9 g/dL — AB (ref 0.4–1.8)
Globulin, Total: 3.4 g/dL (ref 2.2–3.9)
Total Protein ELP: 6.2 g/dL (ref 6.0–8.5)

## 2018-09-26 LAB — KAPPA/LAMBDA LIGHT CHAINS
KAPPA, LAMDA LIGHT CHAIN RATIO: 1.53 (ref 0.26–1.65)
Kappa free light chain: 63.5 mg/L — ABNORMAL HIGH (ref 3.3–19.4)
Lambda free light chains: 41.5 mg/L — ABNORMAL HIGH (ref 5.7–26.3)

## 2018-09-30 ENCOUNTER — Telehealth: Payer: Self-pay | Admitting: Gastroenterology

## 2018-09-30 NOTE — Telephone Encounter (Signed)
Pt is calling to reschedule EGD

## 2018-10-04 ENCOUNTER — Telehealth: Payer: Self-pay | Admitting: *Deleted

## 2018-10-04 NOTE — Telephone Encounter (Signed)
Called patient to inform her that she should be taking iron with vitamin C or she can take Vitron which is a combination of vitamin C and iron.  She also needs to start Vitamin B12 1000 mcg daily. Patient verbalized understanding.

## 2018-10-04 NOTE — Telephone Encounter (Signed)
-----   Message from Karen Kitchens, NP sent at 10/04/2018  2:48 PM EDT ----- Regarding: FW: Meds   ----- Message ----- From: Maryann Conners, NT Sent: 10/04/2018   2:21 PM EDT To: Maryann Conners, NT, Karen Kitchens, NP Subject: Meds                                           Called patient to get her R/S and she asked about an over the counter vitamin that Dr. Loletha Grayer told her to take. She asked if you could give her a call back @ [phone] and let her know what kind it is because she has forgotten.  Brandi Bright

## 2018-10-07 ENCOUNTER — Inpatient Hospital Stay: Payer: Medicaid Other

## 2018-10-07 ENCOUNTER — Inpatient Hospital Stay: Payer: Medicaid Other | Admitting: Hematology and Oncology

## 2018-10-10 ENCOUNTER — Other Ambulatory Visit: Payer: Self-pay

## 2018-10-10 ENCOUNTER — Telehealth: Payer: Self-pay

## 2018-10-10 DIAGNOSIS — K7469 Other cirrhosis of liver: Secondary | ICD-10-CM

## 2018-10-10 DIAGNOSIS — R1084 Generalized abdominal pain: Secondary | ICD-10-CM

## 2018-10-10 NOTE — Telephone Encounter (Signed)
Left message that labs needed to be done, left office hours for lab schedule. The labs are to be done before being treated for Hep C.

## 2018-10-10 NOTE — Telephone Encounter (Signed)
Contacted pt and EGD set up for 10/27/2018 at [facility] with Dr. Bonna Gains. Prep instructins given over the phone, pt does not know where the original instructions are. Given phone number of [facility] ENDO unit: [phone] to call between 1-3 pm the day prior to procedure.

## 2018-10-17 ENCOUNTER — Ambulatory Visit: Payer: Medicaid Other | Admitting: Hematology and Oncology

## 2018-10-17 ENCOUNTER — Other Ambulatory Visit: Payer: Medicaid Other

## 2018-10-24 ENCOUNTER — Other Ambulatory Visit: Payer: Self-pay | Admitting: *Deleted

## 2018-10-24 DIAGNOSIS — D696 Thrombocytopenia, unspecified: Secondary | ICD-10-CM

## 2018-10-26 ENCOUNTER — Telehealth: Payer: Self-pay | Admitting: Gastroenterology

## 2018-10-26 ENCOUNTER — Encounter: Payer: Self-pay | Admitting: Anesthesiology

## 2018-10-26 NOTE — Telephone Encounter (Signed)
Pt left vm she has procedure tomorrow but has not had her Lab Work done she wants to know if she can still do the procedure please call pt

## 2018-10-26 NOTE — Telephone Encounter (Signed)
Pt calling and states she has not had labs done and was told per pt that she was supposed to. The hepc and fibrosure is what we have ordered and pt cannot start medication before these are done. Pt to go ahead with procedure tomorrow and let them know about the labs not being done.

## 2018-10-27 ENCOUNTER — Encounter
Admission: RE | Disposition: A | Discharge status: Home / Self Care | Payer: Self-pay | Source: Ambulatory Visit | Attending: Gastroenterology

## 2018-10-27 ENCOUNTER — Ambulatory Visit
Admission: RE | Admit: 2018-10-27 | Discharge: 2018-10-27 | Disposition: A | Discharge status: Home / Self Care | Payer: Medicaid Other | Source: Ambulatory Visit | Attending: Gastroenterology | Admitting: Gastroenterology

## 2018-10-27 ENCOUNTER — Ambulatory Visit: Payer: Medicaid Other | Admitting: Anesthesiology

## 2018-10-27 DIAGNOSIS — L54 Erythema in diseases classified elsewhere: Secondary | ICD-10-CM | POA: Diagnosis not present

## 2018-10-27 DIAGNOSIS — R1084 Generalized abdominal pain: Secondary | ICD-10-CM

## 2018-10-27 DIAGNOSIS — I8500 Esophageal varices without bleeding: Secondary | ICD-10-CM

## 2018-10-27 DIAGNOSIS — I864 Gastric varices: Secondary | ICD-10-CM | POA: Diagnosis not present

## 2018-10-27 DIAGNOSIS — K3189 Other diseases of stomach and duodenum: Secondary | ICD-10-CM

## 2018-10-27 DIAGNOSIS — I8510 Secondary esophageal varices without bleeding: Secondary | ICD-10-CM | POA: Diagnosis not present

## 2018-10-27 DIAGNOSIS — K7460 Unspecified cirrhosis of liver: Secondary | ICD-10-CM | POA: Diagnosis not present

## 2018-10-27 DIAGNOSIS — R109 Unspecified abdominal pain: Secondary | ICD-10-CM | POA: Diagnosis not present

## 2018-10-27 DIAGNOSIS — E039 Hypothyroidism, unspecified: Secondary | ICD-10-CM | POA: Insufficient documentation

## 2018-10-27 DIAGNOSIS — I252 Old myocardial infarction: Secondary | ICD-10-CM | POA: Insufficient documentation

## 2018-10-27 DIAGNOSIS — K317 Polyp of stomach and duodenum: Secondary | ICD-10-CM | POA: Diagnosis not present

## 2018-10-27 DIAGNOSIS — K7469 Other cirrhosis of liver: Secondary | ICD-10-CM

## 2018-10-27 DIAGNOSIS — K2950 Unspecified chronic gastritis without bleeding: Secondary | ICD-10-CM | POA: Insufficient documentation

## 2018-10-27 DIAGNOSIS — M109 Gout, unspecified: Secondary | ICD-10-CM | POA: Diagnosis not present

## 2018-10-27 DIAGNOSIS — M1990 Unspecified osteoarthritis, unspecified site: Secondary | ICD-10-CM | POA: Diagnosis not present

## 2018-10-27 DIAGNOSIS — Z79899 Other long term (current) drug therapy: Secondary | ICD-10-CM | POA: Insufficient documentation

## 2018-10-27 DIAGNOSIS — K2900 Acute gastritis without bleeding: Secondary | ICD-10-CM | POA: Diagnosis not present

## 2018-10-27 HISTORY — PX: ESOPHAGOGASTRODUODENOSCOPY (EGD) WITH PROPOFOL: SHX5813

## 2018-10-27 SURGERY — ESOPHAGOGASTRODUODENOSCOPY (EGD) WITH PROPOFOL
Anesthesia: General

## 2018-10-27 MED ORDER — LIDOCAINE HCL (PF) 2 % IJ SOLN
INTRAMUSCULAR | Status: AC
  Filled 2018-10-27: qty 10

## 2018-10-27 MED ORDER — SODIUM CHLORIDE 0.9 % IV SOLN
INTRAVENOUS | Status: DC
  Administered 2018-10-27: 1000 mL via INTRAVENOUS

## 2018-10-27 MED ORDER — PHENYLEPHRINE HCL 10 MG/ML IJ SOLN
INTRAMUSCULAR | Status: DC | PRN
  Administered 2018-10-27 (×2): 100 ug via INTRAVENOUS

## 2018-10-27 MED ORDER — LIDOCAINE HCL (CARDIAC) PF 100 MG/5ML IV SOSY
PREFILLED_SYRINGE | INTRAVENOUS | Status: DC | PRN
  Administered 2018-10-27: 50 mg via INTRAVENOUS

## 2018-10-27 MED ORDER — PROPOFOL 500 MG/50ML IV EMUL
INTRAVENOUS | Status: DC | PRN
  Administered 2018-10-27: 130 ug/kg/min via INTRAVENOUS

## 2018-10-27 MED ORDER — MIDAZOLAM HCL 2 MG/2ML IJ SOLN
INTRAMUSCULAR | Status: DC | PRN
  Administered 2018-10-27: 2 mg via INTRAVENOUS

## 2018-10-27 MED ORDER — MIDAZOLAM HCL 2 MG/2ML IJ SOLN
INTRAMUSCULAR | Status: AC
  Filled 2018-10-27: qty 2

## 2018-10-27 MED ORDER — PROPOFOL 500 MG/50ML IV EMUL
INTRAVENOUS | Status: AC
  Filled 2018-10-27: qty 50

## 2018-10-27 MED ORDER — PROPOFOL 10 MG/ML IV BOLUS
INTRAVENOUS | Status: DC | PRN
  Administered 2018-10-27: 80 mg via INTRAVENOUS

## 2018-10-27 NOTE — Anesthesia Postprocedure Evaluation (Signed)
Anesthesia Post Note  Patient: Brandi Bright  Procedure(s) Performed: ESOPHAGOGASTRODUODENOSCOPY (EGD) WITH PROPOFOL (N/A )  Patient location during evaluation: Endoscopy Anesthesia Type: General Level of consciousness: awake and alert Pain management: pain level controlled Vital Signs Assessment: post-procedure vital signs reviewed and stable Respiratory status: spontaneous breathing, nonlabored ventilation, respiratory function stable and patient connected to nasal cannula oxygen Cardiovascular status: blood pressure returned to baseline and stable Postop Assessment: no apparent nausea or vomiting Anesthetic complications: no     Last Vitals:  Vitals:   10/27/18 1006 10/27/18 1016  BP: (!) 84/60 97/64  Pulse: 67 62  Resp: 18 12  Temp:    SpO2: 100% 100%    Last Pain:  Vitals:   10/27/18 1016  TempSrc:   PainSc: 0-No pain                 Joshue Badal S

## 2018-10-27 NOTE — Anesthesia Post-op Follow-up Note (Signed)
Anesthesia QCDR form completed.        

## 2018-10-27 NOTE — Anesthesia Preprocedure Evaluation (Addendum)
Anesthesia Evaluation  Patient identified by MRN, date of birth, ID band Patient awake    Reviewed: Allergy & Precautions, NPO status , Patient's Chart, lab work & pertinent test results, reviewed documented beta blocker date and time   Airway Mallampati: III  TM Distance: >3 FB     Dental  (+) Chipped   Pulmonary           Cardiovascular + Past MI       Neuro/Psych    GI/Hepatic   Endo/Other  Hypothyroidism   Renal/GU      Musculoskeletal  (+) Arthritis ,   Abdominal   Peds  Hematology  (+) anemia ,   Anesthesia Other Findings Gout. EKG ok. Hb 10.1.  Reproductive/Obstetrics                           Anesthesia Physical Anesthesia Plan  ASA: III  Anesthesia Plan: General   Post-op Pain Management:    Induction: Intravenous  PONV Risk Score and Plan:   Airway Management Planned:   Additional Equipment:   Intra-op Plan:   Post-operative Plan:   Informed Consent: I have reviewed the patients History and Physical, chart, labs and discussed the procedure including the risks, benefits and alternatives for the proposed anesthesia with the patient or authorized representative who has indicated his/her understanding and acceptance.     Plan Discussed with: CRNA  Anesthesia Plan Comments:         Anesthesia Quick Evaluation

## 2018-10-27 NOTE — Op Note (Addendum)
[facility] Patient Name: Brandi Bright Procedure Date: 10/27/2018 9:19 AM MRN: [id number] Account #: [id number] Date of Birth: 07-17-1971 Admit Type: Outpatient Age: 47 Room: [facility] ENDO ROOM 3 Gender: Female Note Status: Finalized Procedure:            Upper GI endoscopy Indications:          Cirrhosis rule out esophageal varices Providers:            Darell Saputo B. Maximino Greenland MD, MD Referring MD:         Corky Downs, MD (Referring MD) Medicines:            Monitored Anesthesia Care Complications:        No immediate complications. Procedure:            Pre-Anesthesia Assessment:                       - Prior to the procedure, a History and Physical was                        performed, and patient medications, allergies and                        sensitivities were reviewed. The patient's tolerance of                        previous anesthesia was reviewed.                       - The risks and benefits of the procedure and the                        sedation options and risks were discussed with the                        patient. All questions were answered and informed                        consent was obtained.                       - Patient identification and proposed procedure were                        verified prior to the procedure by the physician, the                        nurse, the anesthesiologist, the anesthetist and the                        technician. The procedure was verified in the procedure                        room.                       - ASA Grade Assessment: III - A patient with severe                        systemic disease.  After obtaining informed consent, the endoscope was                        passed under direct vision. Throughout the procedure,                        the patient's blood pressure, pulse, and oxygen                        saturations were monitored continuously. The  Endoscope                        was introduced through the mouth, and advanced to the                        second part of duodenum. The upper GI endoscopy was                        accomplished with ease. The patient tolerated the                        procedure well. Findings:      Grade II varices were found in the distal esophagus. They were medium in       size. This is procedure was for variceal screening with no previous       history of GI or variceal bleed. Primary prevention with beta blockers       is thus safest option with less complications than serial variceal       banding in this patient. Variceal banding would thus have higher risks       than primary prevention with beta blockers and was thus not done on       today's procedure. No stigmata of bleeding present.      Patchy mildly erythematous mucosa without bleeding was found in the       gastric antrum. Biopsies were taken with a cold forceps for histology.       Biopsies were obtained in the gastric body, at the incisura and in the       gastric antrum with cold forceps for histology.      Patchy mild mucosal changes characterized by congestion, erythema,       friability (with contact bleeding), granularity and nodularity were       found in the gastric fundus and in the gastric body. Biopsies were taken       with a cold forceps for histology.      A single 5 mm sessile polyp with no bleeding and no stigmata of recent       bleeding was found in the gastric antrum. Biopsies were taken with a       cold forceps for histology.      The duodenal bulb, second portion of the duodenum and examined duodenum       were normal. Impression:           - Grade II esophageal varices.                       - Erythematous mucosa in the antrum. Biopsied.                       - Congested, erythematous, friable (with contact  bleeding), granular and nodular mucosa in the gastric                         fundus and gastric body. Biopsied.                       - A single gastric polyp. Biopsied.                       - Normal duodenal bulb, second portion of the duodenum                        and examined duodenum.                       - Biopsies were obtained in the gastric body, at the                        incisura and in the gastric antrum.                       - Patient's iron deficiency can be explained by her                        friable gastric mucosa (likely from portal                        hypertension). No history of variceal bleeding and thus                        this is not a cause of her iron deficiency. Recommendation:       - Await pathology results.                       - Pt is already on Coreg daily which is also a                        medication used for Variceal bleeding prophylaxis. Will                        try to contact her cardiologist to discuss her dose and                        try to increase the dose if tolerated.                       - Discharge patient to home (with escort).                       - Advance diet as tolerated.                       - Continue present medications.                       - Patient has a contact number available for                        emergencies. The signs and symptoms of potential  delayed complications were discussed with the patient.                        Return to normal activities tomorrow. Written discharge                        instructions were provided to the patient.                       - Discharge patient to home (with escort).                       - Patient has Hep C genotype ordered from when she was                        seen in clinic and has not gone to the lab to get this                        done. She was asked to get this done as soon as                        possible so treatment can be initiated.                       - The findings and recommendations were  discussed with                        the patient.                       - The findings and recommendations were discussed with                        the patient's family.                       - Return to my office as previously scheduled.                       - Return to primary care physician as previously                        scheduled. Procedure Code(s):    --- Professional ---                       [phone], Esophagogastroduodenoscopy, flexible, transoral;                        with biopsy, single or multiple Diagnosis Code(s):    --- Professional ---                       K74.60, Unspecified cirrhosis of liver                       I85.10, Secondary esophageal varices without bleeding                       K92.2, Gastrointestinal hemorrhage, unspecified                       K31.89, Other diseases of  stomach and duodenum                       K31.7, Polyp of stomach and duodenum CPT copyright 2018 [employer]. All rights reserved. The codes documented in this report are preliminary and upon coder review may  be revised to meet current compliance requirements.  [name], MD [name] MD, MD 10/27/2018 9:54:11 AM This report has been signed electronically. Number of Addenda: 0 Note Initiated On: 10/27/2018 9:19 AM Estimated Blood Loss: Estimated blood loss: none.      [facility]

## 2018-10-27 NOTE — Transfer of Care (Signed)
Immediate Anesthesia Transfer of Care Note  Patient: Brandi Bright  Procedure(s) Performed: ESOPHAGOGASTRODUODENOSCOPY (EGD) WITH PROPOFOL (N/A )  Patient Location: PACU and Endoscopy Unit  Anesthesia Type:General  Level of Consciousness: drowsy  Airway & Oxygen Therapy: Patient Spontanous Breathing  Post-op Assessment: Report given to RN and Post -op Vital signs reviewed and stable  Post vital signs: Reviewed and stable  Last Vitals:  Vitals Value Taken Time  BP 94/66 10/27/2018  9:46 AM  Temp 36.1 C 10/27/2018  9:46 AM  Pulse 78 10/27/2018  9:47 AM  Resp 17 10/27/2018  9:47 AM  SpO2 93 % 10/27/2018  9:47 AM  Vitals shown include unvalidated device data.  Last Pain:  Vitals:   10/27/18 0946  TempSrc: Tympanic  PainSc:          Complications: No apparent anesthesia complications

## 2018-10-27 NOTE — H&P (Signed)
[name], MD [address] [place], [place], [place], [address] [address], [place], [place], [place], [address] Phone: [phone]  Fax: [phone]  Primary Care Physician:  Cletis Athens, MD   Pre-Procedure History & Physical: HPI:  Brandi Bright is a 47 y.o. female is here for an EGD.   Past Medical History:  Diagnosis Date  . Arthritis   . Back pain   . Gout   . Hypothyroidism   . MI, old   . Thyroid disease     Past Surgical History:  Procedure Laterality Date  . CARDIAC CATHETERIZATION    . CESAREAN SECTION    . COLONOSCOPY WITH PROPOFOL N/A 03/16/2018   Procedure: COLONOSCOPY WITH PROPOFOL;  Surgeon: Virgel Manifold, MD;  Location: [facility] ENDOSCOPY;  Service: Endoscopy;  Laterality: N/A;  . TUBAL LIGATION      Prior to Admission medications   Medication Sig Start Date End Date Taking? Authorizing Provider  carvedilol (COREG) 3.125 MG tablet Take 3.125 mg by mouth daily.   Yes [provider]  levothyroxine (SYNTHROID, LEVOTHROID) 200 MCG tablet Take 200 mcg by mouth daily. 11/26/17  Yes [provider]  nitroGLYCERIN (NITROSTAT) 0.4 MG SL tablet Place 0.4 mg under the tongue every 5 (five) minutes x 3 doses as needed for chest pain.   Yes [provider]  omeprazole (PRILOSEC) 20 MG capsule Take by mouth daily. 07/26/18  Yes [provider]  oxyCODONE-acetaminophen (PERCOCET) 5-325 MG tablet Take 1 tablet by mouth every 6 (six) hours as needed. Patient taking differently: Take 1 tablet by mouth 3 (three) times daily.  02/04/16  Yes Drenda Freeze, MD  rOPINIRole (REQUIP) 2 MG tablet Take 2 mg by mouth at bedtime.   Yes [provider]  spironolactone (ALDACTONE) 50 MG tablet Take 50 mg by mouth daily. 08/21/18  Yes [provider]  traZODone (DESYREL) 50 MG tablet Take 50 mg by mouth at bedtime.   Yes [provider]  venlafaxine XR (EFFEXOR-XR) 75 MG 24 hr capsule Take by mouth daily.  08/22/18  Yes [provider]    Allergies as of 10/10/2018 - Review Complete 09/23/2018  Allergen Reaction Noted  . Amoxicillin Rash and Other (See Comments) 01/11/2016  . Gabapentin Rash 01/11/2016  . Vicodin [hydrocodone-acetaminophen] Rash 01/11/2016    Family History  Problem Relation Age of Onset  . Hypertension Other   . CAD Father   . Colon cancer Father   . Lung cancer Father   . Pancreatic cancer Brother     Social History   Socioeconomic History  . Marital status: Single    Spouse name: Not on file  . Number of children: Not on file  . Years of education: Not on file  . Highest education level: Not on file  Occupational History  . Not on file  Social Needs  . Financial resource strain: Not on file  . Food insecurity:    Worry: Not on file    Inability: Not on file  . Transportation needs:    Medical: Not on file    Non-medical: Not on file  Tobacco Use  . Smoking status: Never Smoker  . Smokeless tobacco: Never Used  Substance and Sexual Activity  . Alcohol use: Not Currently    Comment: has not had a drink in about 5 months  . Drug use: Never  . Sexual activity: Not Currently  Lifestyle  . Physical activity:    Days per week: Not on  file    Minutes per session: Not on file  . Stress: Not on file  Relationships  . Social connections:    Talks on phone: Not on file    Gets together: Not on file    Attends religious service: Not on file    Active member of club or organization: Not on file    Attends meetings of clubs or organizations: Not on file    Relationship status: Not on file  . Intimate partner violence:    Fear of current or ex partner: Not on file    Emotionally abused: Not on file    Physically abused: Not on file    Forced sexual activity: Not on file  Other Topics Concern  . Not on file  Social History Narrative  . Not on file    Review of Systems: See HPI, otherwise negative ROS  Physical Exam: BP 118/83   Pulse  63   Temp (!) 96.8 F (36 C) (Tympanic)   Resp 18   Ht 4\' 9"  (1.448 m)   Wt 78 kg   LMP  (LMP Unknown)   SpO2 100%   BMI 37.22 kg/m  General:   Alert,  pleasant and cooperative in NAD Head:  Normocephalic and atraumatic. Neck:  Supple; no masses or thyromegaly. Lungs:  Clear throughout to auscultation, normal respiratory effort.    Heart:  +S1, +S2, Regular rate and rhythm, No edema. Abdomen:  Soft, nontender and nondistended. Normal bowel sounds, without guarding, and without rebound.   Neurologic:  Alert and  oriented x4;  grossly normal neurologically.  Impression/Plan: Brandi Bright is here for an EGD for variceal screening Risks, benefits, limitations, and alternatives regarding the procedure have been reviewed with the patient.  Questions have been answered.  All parties agreeable.   Virgel Manifold, MD  10/27/2018, 9:21 AM

## 2018-10-28 ENCOUNTER — Encounter: Payer: Self-pay | Admitting: Gastroenterology

## 2018-10-31 ENCOUNTER — Inpatient Hospital Stay: Payer: Medicaid Other | Attending: Hematology and Oncology

## 2018-10-31 ENCOUNTER — Other Ambulatory Visit: Payer: Self-pay

## 2018-10-31 ENCOUNTER — Encounter: Payer: Self-pay | Admitting: Hematology and Oncology

## 2018-10-31 ENCOUNTER — Inpatient Hospital Stay (HOSPITAL_BASED_OUTPATIENT_CLINIC_OR_DEPARTMENT_OTHER): Payer: Medicaid Other | Admitting: Hematology and Oncology

## 2018-10-31 VITALS — BP 128/80 | HR 76 | Temp 97.8°F | Resp 16 | Wt 183.0 lb

## 2018-10-31 DIAGNOSIS — D696 Thrombocytopenia, unspecified: Secondary | ICD-10-CM

## 2018-10-31 DIAGNOSIS — D72819 Decreased white blood cell count, unspecified: Secondary | ICD-10-CM

## 2018-10-31 DIAGNOSIS — D61818 Other pancytopenia: Secondary | ICD-10-CM | POA: Diagnosis not present

## 2018-10-31 DIAGNOSIS — D519 Vitamin B12 deficiency anemia, unspecified: Secondary | ICD-10-CM | POA: Diagnosis not present

## 2018-10-31 DIAGNOSIS — K7460 Unspecified cirrhosis of liver: Secondary | ICD-10-CM

## 2018-10-31 DIAGNOSIS — D509 Iron deficiency anemia, unspecified: Secondary | ICD-10-CM | POA: Insufficient documentation

## 2018-10-31 DIAGNOSIS — Z801 Family history of malignant neoplasm of trachea, bronchus and lung: Secondary | ICD-10-CM | POA: Insufficient documentation

## 2018-10-31 DIAGNOSIS — I8500 Esophageal varices without bleeding: Secondary | ICD-10-CM | POA: Diagnosis not present

## 2018-10-31 DIAGNOSIS — K766 Portal hypertension: Secondary | ICD-10-CM | POA: Diagnosis not present

## 2018-10-31 DIAGNOSIS — R768 Other specified abnormal immunological findings in serum: Secondary | ICD-10-CM | POA: Insufficient documentation

## 2018-10-31 DIAGNOSIS — R161 Splenomegaly, not elsewhere classified: Secondary | ICD-10-CM | POA: Insufficient documentation

## 2018-10-31 DIAGNOSIS — R7989 Other specified abnormal findings of blood chemistry: Secondary | ICD-10-CM | POA: Insufficient documentation

## 2018-10-31 DIAGNOSIS — Z800 Family history of malignant neoplasm of digestive organs: Secondary | ICD-10-CM | POA: Diagnosis not present

## 2018-10-31 DIAGNOSIS — R790 Abnormal level of blood mineral: Secondary | ICD-10-CM | POA: Insufficient documentation

## 2018-10-31 DIAGNOSIS — D649 Anemia, unspecified: Secondary | ICD-10-CM

## 2018-10-31 DIAGNOSIS — E538 Deficiency of other specified B group vitamins: Secondary | ICD-10-CM | POA: Insufficient documentation

## 2018-10-31 LAB — CBC WITH DIFFERENTIAL/PLATELET
Abs Immature Granulocytes: 0.01 10*3/uL (ref 0.00–0.07)
Basophils Absolute: 0 10*3/uL (ref 0.0–0.1)
Basophils Relative: 1 %
Eosinophils Absolute: 0.1 10*3/uL (ref 0.0–0.5)
Eosinophils Relative: 2 %
HCT: 31.4 % — ABNORMAL LOW (ref 36.0–46.0)
Hemoglobin: 10.1 g/dL — ABNORMAL LOW (ref 12.0–15.0)
Immature Granulocytes: 0 %
Lymphocytes Relative: 30 %
Lymphs Abs: 0.8 10*3/uL (ref 0.7–4.0)
MCH: 28.1 pg (ref 26.0–34.0)
MCHC: 32.2 g/dL (ref 30.0–36.0)
MCV: 87.2 fL (ref 80.0–100.0)
Monocytes Absolute: 0.2 10*3/uL (ref 0.1–1.0)
Monocytes Relative: 6 %
Neutro Abs: 1.7 10*3/uL (ref 1.7–7.7)
Neutrophils Relative %: 61 %
Platelets: 47 10*3/uL — ABNORMAL LOW (ref 150–400)
RBC: 3.6 MIL/uL — ABNORMAL LOW (ref 3.87–5.11)
RDW: 15.4 % (ref 11.5–15.5)
WBC: 2.8 10*3/uL — ABNORMAL LOW (ref 4.0–10.5)
nRBC: 0 % (ref 0.0–0.2)

## 2018-10-31 LAB — RETICULOCYTES
Immature Retic Fract: 6.8 % (ref 2.3–15.9)
RBC.: 3.6 MIL/uL — ABNORMAL LOW (ref 3.87–5.11)
Retic Count, Absolute: 55.4 10*3/uL (ref 19.0–186.0)
Retic Ct Pct: 1.5 % (ref 0.4–3.1)

## 2018-10-31 LAB — SURGICAL PATHOLOGY

## 2018-10-31 NOTE — Patient Instructions (Signed)
1. Call us and let us know what the combination pill is. 2. Start daily multi-vitamin that has copper in it.   Honor Loh, MSN, APRN, FNP-C, CEN Oncology/Hematology Nurse Practitioner  [facility] 10/31/18, 3:25 PM

## 2018-10-31 NOTE — Progress Notes (Signed)
[facility] day:  10/31/2018  Chief Complaint: Brandi Bright is a 47 y.o. female with iron deficiency anemia, thrombocytopenia, and leukopenia who is seen for review of work-up and discussion regarding direction of therapy.  HPI:  The patient was last seen in the hematology clinic on 09/23/2018 for initial consultation.  She has known cirrhosis and hepatitis C.  The etiology of her mild pancytopenia was felt secondary to hepatitis C with portal hypertension causing splenomegaly.  She underwent a work-up.  CBC revealed a hematocrit of 31.0, hemoglobin 10.1, MCV 87.6, platelets 46,000, WBC 1900 with an ANC of 1100.  Ferritin was 10 (low).  Iron saturation was 16% with a TIBC of 401.  Retic was 1.2%.  B12 was 315.  Folate was 15.3.  SPEP revealed a polyclonal gammopathy.  Kappa free light chains were 63.5, lambda free light chains 41.5 with a ratio of 1.53 (0.26-1.65).  Copper was 69 (72-166).  ANA was +.  Ds DNA antibody was 13 (0-9).  TSH was 0.036 (0.35-4.5) with a free T4 1.05 (0.82-1.77).    Peripheral smear revealed variant lymphocytes with platelets decreased.  Platelets vary in size with large bodies platelets noted.  She underwent EGD on 10/27/2018 by Dr. Bonna Gains.  Findings revealed grade II esophageal varices.  There was erythematous mucosa in the antrum.  There was congested, erythematous, friable (with contact bleeding), granular and nodular mucosa in the gastric fundus and astric body.  There was a single gastric polyp (polypoid ulcerated antral type mucos with chronic active mucosal inflammation).  There was a normal duodenal bulb, second portion of the duodenum and examined duodenum.  The patient's iron deficiency could be explained by her friable gastric mucosa (likely from portal hypertension).  There was no history of variceal bleeding and thus this is not a cause of her iron deficiency.  During the interim, she has felt "pretty good".  She  denies any melena or hematochezia.  She has been on B12 about 2-2.5 weeks.  She is taking oral iron.   Past Medical History:  Diagnosis Date  . Arthritis   . Back pain   . Gout   . Hypothyroidism   . MI, old   . Thyroid disease     Past Surgical History:  Procedure Laterality Date  . CARDIAC CATHETERIZATION    . CESAREAN SECTION    . COLONOSCOPY WITH PROPOFOL N/A 03/16/2018   Procedure: COLONOSCOPY WITH PROPOFOL;  Surgeon: Virgel Manifold, MD;  Location: [facility];  Service: Endoscopy;  Laterality: N/A;  . ESOPHAGOGASTRODUODENOSCOPY (EGD) WITH PROPOFOL N/A 10/27/2018   Procedure: ESOPHAGOGASTRODUODENOSCOPY (EGD) WITH PROPOFOL;  Surgeon: Virgel Manifold, MD;  Location: [facility] ENDOSCOPY;  Service: Endoscopy;  Laterality: N/A;  . TUBAL LIGATION      Family History  Problem Relation Age of Onset  . Hypertension Other   . CAD Father   . Colon cancer Father   . Lung cancer Father   . Pancreatic cancer Brother     Social History:  reports that she has never smoked. She has never used smokeless tobacco. She reports that she drank alcohol. She reports that she does not use drugs.  Patient does not smoke. No ETOH in 5 months (04/2018). Patient is "fighting disability" for back and knee pain. Patient denies known exposures to radiation on toxins. The patient is alone today.  Allergies:  Allergies  Allergen Reactions  . Amoxicillin Rash and Other (See Comments)    Has  patient had a PCN reaction causing immediate rash, facial/tongue/throat swelling, SOB or lightheadedness with hypotension: Yes Has patient had a PCN reaction causing severe rash involving mucus membranes or skin necrosis: No Has patient had a PCN reaction that required hospitalization: No Has patient had a PCN reaction occurring within the last 10 years: No If all of the above answers are "NO", then may proceed with Cephalosporin use.   . Gabapentin Rash  . Vicodin [Hydrocodone-Acetaminophen] Rash     Current Medications: Current Outpatient Medications  Medication Sig Dispense Refill  . carvedilol (COREG) 3.125 MG tablet Take 3.125 mg by mouth daily.    Marland Kitchen levothyroxine (SYNTHROID, LEVOTHROID) 200 MCG tablet Take 200 mcg by mouth daily.  8  . nitroGLYCERIN (NITROSTAT) 0.4 MG SL tablet Place 0.4 mg under the tongue every 5 (five) minutes x 3 doses as needed for chest pain.    . NON FORMULARY Vitamin B12 with iron one daily    . omeprazole (PRILOSEC) 20 MG capsule Take by mouth daily.  4  . oxyCODONE-acetaminophen (PERCOCET) 5-325 MG tablet Take 1 tablet by mouth every 6 (six) hours as needed. (Patient taking differently: Take 1 tablet by mouth 3 (three) times daily. ) 8 tablet 0  . spironolactone (ALDACTONE) 50 MG tablet Take 50 mg by mouth daily.  5  . traZODone (DESYREL) 50 MG tablet Take 50 mg by mouth at bedtime.    Marland Kitchen venlafaxine XR (EFFEXOR-XR) 75 MG 24 hr capsule Take by mouth daily.  4  . rOPINIRole (REQUIP) 2 MG tablet Take 2 mg by mouth at bedtime.     No current facility-administered medications for this visit.     Review of Systems:  GENERAL:  Feels "pretty good".  No fevers, sweats.  Feels cold.  Weight up 7 pounds. PERFORMANCE STATUS (ECOG):  1 HEENT:  No visual changes, runny nose, sore throat, mouth sores or tenderness. Lungs: Shortness of breath with exertion.  No cough.  No hemoptysis. Cardiac:  No chest pain, palpitations, orthopnea, or PND. GI:  No nausea, vomiting, diarrhea, constipation, melena or hematochezia. GU:  No urgency, frequency, dysuria, or hematuria. Musculoskeletal:  No back pain.  Arthritis in wrists, elbows, neck, and ankles.  No muscle tenderness. Extremities:  No pain or swelling. Skin:  No rashes or skin changes. Neuro:  No headache, numbness or weakness, balance or coordination issues. Endocrine:  No diabetes.  Thyroid issues on Synthroid.  No hot flashes or night sweats. Psych:  No mood changes, depression or anxiety. Pain:  No focal  pain. Review of systems:  All other systems reviewed and found to be negative.   Physical Exam: Blood pressure 128/80, pulse 76, temperature 97.8 F (36.6 C), temperature source Tympanic, resp. rate 16, weight 183 lb (83 kg), SpO2 96 %. GENERAL:  Well developed, well nourished, woman sitting comfortably in the exam room in no acute distress. MENTAL STATUS:  Alert and oriented to person, place and time. HEAD:  Dark hair.  Normocephalic, atraumatic, face symmetric, no Cushingoid features. EYES:  Blue eyes.  No conjunctivitis or scleral icterus. NEUROLOGICAL: Unremarkable. PSYCH:  Appropriate.    Orders Only on 10/31/2018  Component Date Value Ref Range Status  . Retic Ct Pct 10/31/2018 1.5  0.4 - 3.1 % Final  . RBC. 10/31/2018 3.60* 3.87 - 5.11 MIL/uL Final  . Retic Count, Absolute 10/31/2018 55.4  19.0 - 186.0 K/uL Final  . Immature Retic Fract 10/31/2018 6.8  2.3 - 15.9 % Final   Performed at [facility], [address] [place], [place] [address]  . WBC 10/31/2018 2.8* 4.0 - 10.5 K/uL Final  . RBC 10/31/2018 3.60* 3.87 - 5.11 MIL/uL Final  . Hemoglobin 10/31/2018 10.1* 12.0 - 15.0 g/dL Final  . HCT 10/31/2018 31.4* 36.0 - 46.0 % Final  . MCV 10/31/2018 87.2  80.0 - 100.0 fL Final  . MCH 10/31/2018 28.1  26.0 - 34.0 pg Final  . MCHC 10/31/2018 32.2  30.0 - 36.0 g/dL Final  . RDW 10/31/2018 15.4  11.5 - 15.5 % Final  . Platelets 10/31/2018 47* 150 - 400 K/uL Final   Comment: Immature Platelet Fraction may be clinically indicated, consider ordering this additional test [id number]   . nRBC 10/31/2018 0.0  0.0 - 0.2 % Final  . Neutrophils Relative % 10/31/2018 61  % Final  . Neutro Abs 10/31/2018 1.7  1.7 - 7.7 K/uL Final  . Lymphocytes Relative 10/31/2018 30  % Final  . Lymphs Abs 10/31/2018 0.8  0.7 - 4.0 K/uL Final  . Monocytes Relative 10/31/2018 6  % Final  . Monocytes Absolute 10/31/2018 0.2  0.1 - 1.0 K/uL Final  . Eosinophils Relative 10/31/2018 2  % Final  .  Eosinophils Absolute 10/31/2018 0.1  0.0 - 0.5 K/uL Final  . Basophils Relative 10/31/2018 1  % Final  . Basophils Absolute 10/31/2018 0.0  0.0 - 0.1 K/uL Final  . Immature Granulocytes 10/31/2018 0  % Final  . Abs Immature Granulocytes 10/31/2018 0.01  0.00 - 0.07 K/uL Final   Performed at [facility], [address], [place], [place] [address]    Assessment:  Brandi Bright is a 47 y.o. female with cirrhosis and hepatitis C.  She has a history of chronic anemia, thrombocytopenia and leukopenia dating back to 03/2012.  Platelet count has fluctuated between 54,000 - 56,000 since 12/28/2017 (previously 73,000 - 134,000).  Ferritin was 14 on 08/31/2018.  She eats meat daily.  Work-up on 09/23/2018 revealed a hematocrit of 31.0, hemoglobin 10.1, MCV 87.6, platelets 46,000, WBC 1900 with an ANC of 1100.  Ferritin was 10 (low) with iron saturation 16% with a TIBC of 401.  Retic was 1.2%.  B12 was 315.  Normal studies included: folate, SPEP, and free light chain ratio.  Copper was 69 (72-166).  ANA was + with double stranded DNA antibody 13 (0-9).  TSH was 0.036 (0.35-4.5) with a free T4 1.05 (0.82-1.77).  Peripheral smear revealed variant lymphocytes.  Ferritin has been followed:  14 on 08/31/2018 and 10 on 09/23/2018.  Abdomen and pelvic CT on 07/14/2018 revealed cirrhosis with portal hypertension noted by prominent splenomegaly (18.5 x 13.8 x 8.1 cm; volume 1100 cm3), enlarged portal veins with recannulated umbilical vein, paraesophageal and perigastric varices.  There was mild thickening of the cecum and ascending colon.  EGD on 10/27/2018 revealed grade II esophageal varices.  There was erythematous mucosa in the antrum.  There was congested, erythematous, friable (with contact bleeding), granular and nodular mucosa in the gastric fundus and astric body.  There was a single gastric polyp (polypoid ulcerated antral type mucos with chronic active mucosal inflammation).  There was a normal  duodenal bulb, second portion of the duodenum and examined duodenum.  The patient's iron deficiency could be explained by her friable gastric mucosa (likely from portal hypertension).  There was no history of variceal bleeding and thus this is not a cause of her iron deficiency.  Symptomatically, she feels "pretty good".  She denies any bleeding.  Exam  is stable.  Plan: 1.  Review work-up. 2.  Mild panyctopenia:  Etiology likely due to hepatitis C with portal hypertension causing splenomegaly.  Iron deficiency and B12 deficiency contributing to anemia.  Low normal copper level.  Discuss MVI with copper.  No monoclonal gammopathy.   Unclear significance of variant lymphocytes.  Check flow cytometry.  Unclear significance of + ANA.  Contact rheumatology. 3.  Iron deficiency anemia:  Review interval upper endoscopy.    Friable gastric mucosa likely from portal hypertension.  Continue oral iron.  Consider IV iron if no improvement. 4.  B12 deficiency:  B12 borderline on 09/23/2018.  Patient on oral B12.  Check level after 1 month. 5.  RTC in 1 month for MD assessment, labs (CBC with diff, ferritin, B12, flow cytometry), and +/- Venofer.    Honor Loh, NP  10/31/2018, 3:11 PM   I saw and evaluated the patient, participating in the key portions of the service and reviewing pertinent diagnostic studies and records.  I reviewed the nurse practitioner's note and agree with the findings and the plan.  The assessment and plan were discussed with the patient.  Several questions were asked by the patient and answered.   Nolon Stalls, MD 10/31/2018,3:11 PM

## 2018-10-31 NOTE — Progress Notes (Signed)
Pt in for follow up and test results. Reports being "tired and sleepy a lot". Pt reports taking Vitamin b12 with iron but has not been taking Vitamin c as instructed.

## 2018-11-01 ENCOUNTER — Encounter: Payer: Self-pay | Admitting: Gastroenterology

## 2018-11-01 ENCOUNTER — Telehealth: Payer: Self-pay | Admitting: Gastroenterology

## 2018-11-01 NOTE — Telephone Encounter (Signed)
I talked to Dr. Lavera Guise, who stated that the patient is on Coreg 6.25 mg twice daily and not 3.125 mg once daily as was previously documented in the chart.  I have corrected the chart to reflect this change.  I discussed need for beta-blockers in this patient for variceal bleeding prophylaxis with Dr. Lavera Guise and he is okay with increasing her dose.  We will increase the dose, and she has clinic follow-up.  Her last known vitals were from November 18, with systolic blood pressure 128/78, and heart rate of 76.  As per up to date Targeted dose for carvedilol for child Pugh class a cirrhosis is 12.5 mg twice daily.

## 2018-11-02 ENCOUNTER — Telehealth: Payer: Self-pay

## 2018-11-02 MED ORDER — CARVEDILOL 12.5 MG PO TABS: 12.5000 mg | ORAL_TABLET | Freq: Two times a day (BID) | ORAL | 0 refills | Status: DC

## 2018-11-02 NOTE — Telephone Encounter (Signed)
-----   Message from Virgel Manifold, MD sent at 11/01/2018  9:34 AM EST ----- Brandi Bright, can you please call this patient and tell her that we talked to Dr. Rebecka Apley about increasing her Coreg and he is in agreement with increasing the dose.  Can you send her new prescription for Coreg 12.5 mg twice daily to her pharmacy.  She is currently on 6.25 mg twice daily, and discontinue that, and increase the dose as stated above.  She has clinic follow-up with me in the next few weeks, please tell her to keep this appointment so we can recheck her vitals at that appointment.  Please tell her to call us if after increasing the dose she starts having dizziness, or any other new symptoms.

## 2018-11-02 NOTE — Telephone Encounter (Signed)
Let detailed message for pt and if to contact office tomorrow if further information is needed. Coreg increased from 6.25mg  bid to Coreg 12.5 mg bid. Stop the 6.25mg  dose.

## 2018-11-28 ENCOUNTER — Ambulatory Visit: Payer: Medicaid Other | Admitting: Hematology and Oncology

## 2018-11-28 ENCOUNTER — Inpatient Hospital Stay: Payer: Medicaid Other

## 2018-11-28 ENCOUNTER — Inpatient Hospital Stay: Payer: Medicaid Other | Admitting: Hematology and Oncology

## 2018-11-28 DIAGNOSIS — M542 Cervicalgia: Secondary | ICD-10-CM | POA: Diagnosis not present

## 2018-11-28 DIAGNOSIS — M25512 Pain in left shoulder: Secondary | ICD-10-CM | POA: Diagnosis not present

## 2018-11-28 DIAGNOSIS — G894 Chronic pain syndrome: Secondary | ICD-10-CM | POA: Diagnosis not present

## 2018-11-28 DIAGNOSIS — Z79891 Long term (current) use of opiate analgesic: Secondary | ICD-10-CM | POA: Diagnosis not present

## 2018-11-28 DIAGNOSIS — M545 Low back pain: Secondary | ICD-10-CM | POA: Diagnosis not present

## 2018-11-28 NOTE — Progress Notes (Deleted)
[facility] day:  11/28/2018  Chief Complaint: Brandi Bright is a 47 y.o. female with iron deficiency anemia, thrombocytopenia, and leukopenia who is seen for 1 month assessment.  HPI:  The patient was last seen in the hematology clinic on 10/31/2018.  At that time, she felt "pretty good".  She denied any bleeding.  Exam was stable.  Etiology of her pancytopenia was felt likely due to hepatitis C with portal hypertension causing splenomegaly.  She had iron deficiency and B12 deficiency contributing to her anemia.  She had a low normal copper level.  We diiscussed MVI with copper.  She had some variant lymphocytes on peripheral smear.  During the interim,    Past Medical History:  Diagnosis Date  . Arthritis   . Back pain   . Gout   . Hypothyroidism   . MI, old   . Thyroid disease     Past Surgical History:  Procedure Laterality Date  . CARDIAC CATHETERIZATION    . CESAREAN SECTION    . COLONOSCOPY WITH PROPOFOL N/A 03/16/2018   Procedure: COLONOSCOPY WITH PROPOFOL;  Surgeon: Virgel Manifold, MD;  Location: [facility] END[facility]COPY;  Service: Endoscopy;  Laterality: N/A;  . ESOPHAGOGASTRODUODENOSCOPY (EGD) WITH PROPOFOL N/A 10/27/2018   Procedure: ESOPHAGOGASTRODUODENOSCOPY (EGD) WITH PROPOFOL;  Surgeon: Virgel Manifold, MD;  Location: [facility] END[facility];  Service: Endoscopy;  Laterality: N/A;  . TUBAL LIGATION      Family History  Problem Relation Age of Onset  . Hypertension Other   . CAD Father   . Colon cancer Father   . Lung cancer Father   . Pancreatic cancer Brother     Social History:  reports that she has never smoked. She has never used smokeless tobacco. She reports previous alcohol use. She reports that she does not use drugs.  Patient does not smoke. No ETOH in 5 months (04/2018). Patient is "fighting disability" for back and knee pain. Patient denies known exposures to radiation on toxins. The patient is alone  today.  Allergies:  Allergies  Allergen Reactions  . Amoxicillin Rash and Other (See Comments)    Has patient had a PCN reaction causing immediate rash, facial/tongue/throat swelling, SOB or lightheadedness with hypotension: Yes Has patient had a PCN reaction causing severe rash involving mucus membranes or skin necrosis: No Has patient had a PCN reaction that required hospitalization: No Has patient had a PCN reaction occurring within the last 10 years: No If all of the above answers are "NO", then may proceed with Cephalosporin use.   . Gabapentin Rash  . Vicodin [Hydrocodone-Acetaminophen] Rash    Current Medications: Current Outpatient Medications  Medication Sig Dispense Refill  . carvedilol (COREG) 12.5 MG tablet Take 1 tablet (12.5 mg total) by mouth 2 (two) times daily with a meal. 60 tablet 0  . levothyroxine (SYNTHROID, LEVOTHROID) 200 MCG tablet Take 200 mcg by mouth daily.  8  . nitroGLYCERIN (NITROSTAT) 0.4 MG SL tablet Place 0.4 mg under the tongue every 5 (five) minutes x 3 doses as needed for chest pain.    . NON FORMULARY Vitamin B12 with iron one daily    . omeprazole (PRILOSEC) 20 MG capsule Take by mouth daily.  4  . rOPINIRole (REQUIP) 2 MG tablet Take 2 mg by mouth at bedtime.    Marland Kitchen spironolactone (ALDACTONE) 50 MG tablet Take 50 mg by mouth daily.  5  . traZODone (DESYREL) 50 MG tablet Take 50 mg by mouth  at bedtime.    Marland Kitchen venlafaxine XR (EFFEXOR-XR) 75 MG 24 hr capsule Take by mouth daily.  4   No current facility-administered medications for this visit.     Review of Systems:  GENERAL:  Feels "pretty good".  No fevers, sweats.  Feels cold.  Weight up 7 pounds. PERFORMANCE STATUS (ECOG):  1 HEENT:  No visual changes, runny nose, sore throat, mouth sores or tenderness. Lungs: Shortness of breath with exertion.  No cough.  No hemoptysis. Cardiac:  No chest pain, palpitations, orthopnea, or PND. GI:  No nausea, vomiting, diarrhea, constipation, melena or  hematochezia. GU:  No urgency, frequency, dysuria, or hematuria. Musculoskeletal:  No back pain.  Arthritis in wrists, elbows, neck, and ankles.  No muscle tenderness. Extremities:  No pain or swelling. Skin:  No rashes or skin changes. Neuro:  No headache, numbness or weakness, balance or coordination issues. Endocrine:  No diabetes.  Thyroid issues on Synthroid.  No hot flashes or night sweats. Psych:  No mood changes, depression or anxiety. Pain:  No focal pain. Review of systems:  All other systems reviewed and found to be negative.   Physical Exam: There were no vitals taken for this visit. GENERAL:  Well developed, well nourished, woman sitting comfortably in the exam room in no acute distress. MENTAL STATUS:  Alert and oriented to person, place and time. HEAD:  Dark hair.  Normocephalic, atraumatic, face symmetric, no Cushingoid features. EYES:  Blue eyes.  No conjunctivitis or scleral icterus. NEUROLOGICAL: Unremarkable. PSYCH:  Appropriate.    No visits with results within 3 Day(s) from this visit.  Latest known visit with results is:  Orders Only on 10/31/2018  Component Date Value Ref Range Status  . Retic Ct Pct 10/31/2018 1.5  0.4 - 3.1 % Final  . RBC. 10/31/2018 3.60* 3.87 - 5.11 MIL/uL Final  . Retic Count, Absolute 10/31/2018 55.4  19.0 - 186.0 K/uL Final  . Immature Retic Fract 10/31/2018 6.8  2.3 - 15.9 % Final   Performed at [facility], [address] [place], [place] [address]  . WBC 10/31/2018 2.8* 4.0 - 10.5 K/uL Final  . RBC 10/31/2018 3.60* 3.87 - 5.11 MIL/uL Final  . Hemoglobin 10/31/2018 10.1* 12.0 - 15.0 g/dL Final  . HCT 10/31/2018 31.4* 36.0 - 46.0 % Final  . MCV 10/31/2018 87.2  80.0 - 100.0 fL Final  . MCH 10/31/2018 28.1  26.0 - 34.0 pg Final  . MCHC 10/31/2018 32.2  30.0 - 36.0 g/dL Final  . RDW 10/31/2018 15.4  11.5 - 15.5 % Final  . Platelets 10/31/2018 47* 150 - 400 K/uL Final   Comment: Immature Platelet Fraction may be clinically  indicated, consider ordering this additional test [id number]   . nRBC 10/31/2018 0.0  0.0 - 0.2 % Final  . Neutrophils Relative % 10/31/2018 61  % Final  . Neutro Abs 10/31/2018 1.7  1.7 - 7.7 K/uL Final  . Lymphocytes Relative 10/31/2018 30  % Final  . Lymphs Abs 10/31/2018 0.8  0.7 - 4.0 K/uL Final  . Monocytes Relative 10/31/2018 6  % Final  . Monocytes Absolute 10/31/2018 0.2  0.1 - 1.0 K/uL Final  . Eosinophils Relative 10/31/2018 2  % Final  . Eosinophils Absolute 10/31/2018 0.1  0.0 - 0.5 K/uL Final  . Basophils Relative 10/31/2018 1  % Final  . Basophils Absolute 10/31/2018 0.0  0.0 - 0.1 K/uL Final  . Immature Granulocytes 10/31/2018 0  % Final  . Abs Immature Granulocytes  10/31/2018 0.01  0.00 - 0.07 K/uL Final   Performed at [facility], [address], [place], [place] [address]    Assessment:  Brandi Bright is a 48 y.o. female with cirrhosis and hepatitis C.  She has a history of chronic anemia, thrombocytopenia and leukopenia dating back to 03/2012.  Platelet count has fluctuated between 54,000 - 56,000 since 12/28/2017 (previously 73,000 - 134,000).  Ferritin was 14 on 08/31/2018.  She eats meat daily.  Work-up on 09/23/2018 revealed a hematocrit of 31.0, hemoglobin 10.1, MCV 87.6, platelets 46,000, WBC 1900 with an ANC of 1100.  Ferritin was 10 (low) with iron saturation 16% with a TIBC of 401.  Retic was 1.2%.  B12 was 315.  Normal studies included: folate, SPEP, and free light chain ratio.  Copper was 69 (72-166).  ANA was + with double stranded DNA antibody 13 (0-9).  TSH was 0.036 (0.35-4.5) with a free T4 1.05 (0.82-1.77).  Peripheral smear revealed variant lymphocytes.  Ferritin has been followed:  14 on 08/31/2018 and 10 on 09/23/2018.  Abdomen and pelvic CT on 07/14/2018 revealed cirrhosis with portal hypertension noted by prominent splenomegaly (18.5 x 13.8 x 8.1 cm; volume 1100 cm3), enlarged portal veins with recannulated umbilical vein, paraesophageal and  perigastric varices.  There was mild thickening of the cecum and ascending colon.  EGD on 10/27/2018 revealed grade II esophageal varices.  There was erythematous mucosa in the antrum.  There was congested, erythematous, friable (with contact bleeding), granular and nodular mucosa in the gastric fundus and astric body.  There was a single gastric polyp (polypoid ulcerated antral type mucos with chronic active mucosal inflammation).  There was a normal duodenal bulb, second portion of the duodenum and examined duodenum.  The patient's iron deficiency could be explained by her friable gastric mucosa (likely from portal hypertension).  There was no history of variceal bleeding and thus this is not a cause of her iron deficiency.  Symptomatically,  she feels "pretty good".  She denies any bleeding.  Exam is stable.   Plan: 1.  Labs today:  CBC with diff, ferritin, B12, flow cytometry.   2.  Mild panyctopenia:  Etiology likely due to hepatitis C with portal hypertension causing splenomegaly.  Iron deficiency and B12 deficiency contributing to anemia.  Low normal copper level.  Discuss MVI with copper.  No monoclonal gammopathy.   Unclear significance of variant lymphocytes.  Check flow cytometry.  Unclear significance of + ANA.  Contact rheumatology. 3.  Iron deficiency anemia:  Review interval upper endoscopy.    Friable gastric mucosa likely from portal hypertension.  Continue oral iron.  Consider IV iron if no improvement. 4.  B12 deficiency:  B12 borderline on 09/23/2018.  Patient on oral B12.  Check level after 1 month. 5.  RTC in 1 month for MD assessment, labs (CBC with diff, ferritin, B12, flow cytometry), and +/- Venofer.    Lequita Asal, MD  11/28/2018, 5:12 AM   I saw and evaluated the patient, participating in the key portions of the service and reviewing pertinent diagnostic studies and records.  I reviewed the nurse practitioner's note and agree with the findings and  the plan.  The assessment and plan were discussed with the patient.  Several questions were asked by the patient and answered.   Nolon Stalls, MD 11/28/2018,5:12 AM

## 2018-11-29 ENCOUNTER — Encounter: Payer: Self-pay | Admitting: Gastroenterology

## 2018-11-29 ENCOUNTER — Ambulatory Visit: Payer: Medicaid Other | Admitting: Gastroenterology

## 2018-11-29 DIAGNOSIS — K7469 Other cirrhosis of liver: Secondary | ICD-10-CM

## 2018-12-04 ENCOUNTER — Other Ambulatory Visit: Payer: Self-pay | Admitting: Gastroenterology

## 2018-12-08 ENCOUNTER — Inpatient Hospital Stay: Payer: Medicaid Other

## 2018-12-08 ENCOUNTER — Inpatient Hospital Stay: Payer: Medicaid Other | Admitting: Hematology and Oncology

## 2018-12-08 NOTE — Progress Notes (Deleted)
[facility] [facility] day:  12/08/2018  Chief Complaint: Brandi Bright is a 47 y.o. female with iron deficiency anemia, thrombocytopenia, and leukopenia who is seen for 1 month assessment.  HPI:  The patient was last seen in the hematology clinic on 10/31/2018.  At that time, she felt "pretty good".  She denied any bleeding.  Exam was stable.  Etiology of her pancytopenia was felt likely due to hepatitis C with portal hypertension causing splenomegaly.  She had iron deficiency and B12 deficiency contributing to her anemia.  She had a low normal copper level.  We diiscussed MVI with copper.  She had some variant lymphocytes on peripheral smear.  During the interim,    Past Medical History:  Diagnosis Date  . Arthritis   . Back pain   . Gout   . Hypothyroidism   . MI, old   . Thyroid disease     Past Surgical History:  Procedure Laterality Date  . CARDIAC CATHETERIZATION    . CESAREAN SECTION    . COLONOSCOPY WITH PROPOFOL N/A 03/16/2018   Procedure: COLONOSCOPY WITH PROPOFOL;  Surgeon: Virgel Manifold, MD;  Location: [facility] ENDOSCOPY;  Service: Endoscopy;  Laterality: N/A;  . ESOPHAGOGASTRODUODENOSCOPY (EGD) WITH PROPOFOL N/A 10/27/2018   Procedure: ESOPHAGOGASTRODUODENOSCOPY (EGD) WITH PROPOFOL;  Surgeon: Virgel Manifold, MD;  Location: [facility] ENDOSCOPY;  Service: Endoscopy;  Laterality: N/A;  . TUBAL LIGATION      Family History  Problem Relation Age of Onset  . Hypertension Other   . CAD Father   . Colon cancer Father   . Lung cancer Father   . Pancreatic cancer Brother     Social History:  reports that she has never smoked. She has never used smokeless tobacco. She reports previous alcohol use. She reports that she does not use drugs.  Patient does not smoke. No ETOH in 5 months (04/2018). Patient is "fighting disability" for back and knee pain. Patient denies known exposures to radiation on toxins. The patient is alone  today.  Allergies:  Allergies  Allergen Reactions  . Amoxicillin Rash and Other (See Comments)    Has patient had a PCN reaction causing immediate rash, facial/tongue/throat swelling, SOB or lightheadedness with hypotension: Yes Has patient had a PCN reaction causing severe rash involving mucus membranes or skin necrosis: No Has patient had a PCN reaction that required hospitalization: No Has patient had a PCN reaction occurring within the last 10 years: No If all of the above answers are "NO", then may proceed with Cephalosporin use.   . Gabapentin Rash  . Vicodin [Hydrocodone-Acetaminophen] Rash    Current Medications: Current Outpatient Medications  Medication Sig Dispense Refill  . carvedilol (COREG) 12.5 MG tablet TAKE 1 TABLET (12.5 MG TOTAL) BY MOUTH 2 (TWO) TIMES DAILY WITH A MEAL. 60 tablet 0  . levothyroxine (SYNTHROID, LEVOTHROID) 200 MCG tablet Take 200 mcg by mouth daily.  8  . nitroGLYCERIN (NITROSTAT) 0.4 MG SL tablet Place 0.4 mg under the tongue every 5 (five) minutes x 3 doses as needed for chest pain.    . NON FORMULARY Vitamin B12 with iron one daily    . omeprazole (PRILOSEC) 20 MG capsule Take by mouth daily.  4  . rOPINIRole (REQUIP) 2 MG tablet Take 2 mg by mouth at bedtime.    Marland Kitchen spironolactone (ALDACTONE) 50 MG tablet Take 50 mg by mouth daily.  5  . traZODone (DESYREL) 50 MG tablet Take 50 mg by mouth  at bedtime.    Marland Kitchen venlafaxine XR (EFFEXOR-XR) 75 MG 24 hr capsule Take by mouth daily.  4   No current facility-administered medications for this visit.     Review of Systems:  GENERAL:  Feels "pretty good".  No fevers, sweats.  Feels cold.  Weight up 7 pounds. PERFORMANCE STATUS (ECOG):  1 HEENT:  No visual changes, runny nose, sore throat, mouth sores or tenderness. Lungs: Shortness of breath with exertion.  No cough.  No hemoptysis. Cardiac:  No chest pain, palpitations, orthopnea, or PND. GI:  No nausea, vomiting, diarrhea, constipation, melena or  hematochezia. GU:  No urgency, frequency, dysuria, or hematuria. Musculoskeletal:  No back pain.  Arthritis in wrists, elbows, neck, and ankles.  No muscle tenderness. Extremities:  No pain or swelling. Skin:  No rashes or skin changes. Neuro:  No headache, numbness or weakness, balance or coordination issues. Endocrine:  No diabetes.  Thyroid issues on Synthroid.  No hot flashes or night sweats. Psych:  No mood changes, depression or anxiety. Pain:  No focal pain. Review of systems:  All other systems reviewed and found to be negative.   Physical Exam: There were no vitals taken for this visit. GENERAL:  Well developed, well nourished, woman sitting comfortably in the exam room in no acute distress. MENTAL STATUS:  Alert and oriented to person, place and time. HEAD:  Dark hair.  Normocephalic, atraumatic, face symmetric, no Cushingoid features. EYES:  Blue eyes.  No conjunctivitis or scleral icterus. NEUROLOGICAL: Unremarkable. PSYCH:  Appropriate.    No visits with results within 3 Day(s) from this visit.  Latest known visit with results is:  Orders Only on 10/31/2018  Component Date Value Ref Range Status  . Retic Ct Pct 10/31/2018 1.5  0.4 - 3.1 % Final  . RBC. 10/31/2018 3.60* 3.87 - 5.11 MIL/uL Final  . Retic Count, Absolute 10/31/2018 55.4  19.0 - 186.0 K/uL Final  . Immature Retic Fract 10/31/2018 6.8  2.3 - 15.9 % Final   Performed at [facility], [address], [place],  [address]  . WBC 10/31/2018 2.8* 4.0 - 10.5 K/uL Final  . RBC 10/31/2018 3.60* 3.87 - 5.11 MIL/uL Final  . Hemoglobin 10/31/2018 10.1* 12.0 - 15.0 g/dL Final  . HCT 10/31/2018 31.4* 36.0 - 46.0 % Final  . MCV 10/31/2018 87.2  80.0 - 100.0 fL Final  . MCH 10/31/2018 28.1  26.0 - 34.0 pg Final  . MCHC 10/31/2018 32.2  30.0 - 36.0 g/dL Final  . RDW 10/31/2018 15.4  11.5 - 15.5 % Final  . Platelets 10/31/2018 47* 150 - 400 K/uL Final   Comment: Immature Platelet Fraction may be clinically  indicated, consider ordering this additional test [id number]   . nRBC 10/31/2018 0.0  0.0 - 0.2 % Final  . Neutrophils Relative % 10/31/2018 61  % Final  . Neutro Abs 10/31/2018 1.7  1.7 - 7.7 K/uL Final  . Lymphocytes Relative 10/31/2018 30  % Final  . Lymphs Abs 10/31/2018 0.8  0.7 - 4.0 K/uL Final  . Monocytes Relative 10/31/2018 6  % Final  . Monocytes Absolute 10/31/2018 0.2  0.1 - 1.0 K/uL Final  . Eosinophils Relative 10/31/2018 2  % Final  . Eosinophils Absolute 10/31/2018 0.1  0.0 - 0.5 K/uL Final  . Basophils Relative 10/31/2018 1  % Final  . Basophils Absolute 10/31/2018 0.0  0.0 - 0.1 K/uL Final  . Immature Granulocytes 10/31/2018 0  % Final  . Abs Immature Granulocytes  10/31/2018 0.01  0.00 - 0.07 K/uL Final   Performed at [facility], [address] [place], [place] [address]    Assessment:  Brandi Bright is a 47 y.o. female with cirrhosis and hepatitis C.  She has a history of chronic anemia, thrombocytopenia and leukopenia dating back to 03/2012.  Platelet count has fluctuated between 54,000 - 56,000 since 12/28/2017 (previously 73,000 - 134,000).  Ferritin was 14 on 08/31/2018.  She eats meat daily.  Work-up on 09/23/2018 revealed a hematocrit of 31.0, hemoglobin 10.1, MCV 87.6, platelets 46,000, WBC 1900 with an ANC of 1100.  Ferritin was 10 (low) with iron saturation 16% with a TIBC of 401.  Retic was 1.2%.  B12 was 315.  Normal studies included: folate, SPEP, and free light chain ratio.  Copper was 69 (72-166).  ANA was + with double stranded DNA antibody 13 (0-9).  TSH was 0.036 (0.35-4.5) with a free T4 1.05 (0.82-1.77).  Peripheral smear revealed variant lymphocytes.  Ferritin has been followed:  14 on 08/31/2018 and 10 on 09/23/2018.  Abdomen and pelvic CT on 07/14/2018 revealed cirrhosis with portal hypertension noted by prominent splenomegaly (18.5 x 13.8 x 8.1 cm; volume 1100 cm3), enlarged portal veins with recannulated umbilical vein, paraesophageal and  perigastric varices.  There was mild thickening of the cecum and ascending colon.  EGD on 10/27/2018 revealed grade II esophageal varices.  There was erythematous mucosa in the antrum.  There was congested, erythematous, friable (with contact bleeding), granular and nodular mucosa in the gastric fundus and astric body.  There was a single gastric polyp (polypoid ulcerated antral type mucos with chronic active mucosal inflammation).  There was a normal duodenal bulb, second portion of the duodenum and examined duodenum.  The patient's iron deficiency could be explained by her friable gastric mucosa (likely from portal hypertension).  There was no history of variceal bleeding and thus this is not a cause of her iron deficiency.  Symptomatically,  she feels "pretty good".  She denies any bleeding.  Exam is stable.   Plan: 1.  Labs today:  CBC with diff, ferritin, B12, flow cytometry.   2.  Mild panyctopenia:  Etiology likely due to hepatitis C with portal hypertension causing splenomegaly.  Iron deficiency and B12 deficiency contributing to anemia.  Low normal copper level.  Discuss MVI with copper.  No monoclonal gammopathy.   Unclear significance of variant lymphocytes.  Check flow cytometry.  Unclear significance of + ANA.  Contact rheumatology. 3.  Iron deficiency anemia:  Review interval upper endoscopy.    Friable gastric mucosa likely from portal hypertension.  Continue oral iron.  Consider IV iron if no improvement. 4.  B12 deficiency:  B12 borderline on 09/23/2018.  Patient on oral B12.  Check level after 1 month. 5.  RTC in 1 month for MD assessment, labs (CBC with diff, ferritin, B12, flow cytometry), and +/- Venofer.    Lequita Asal, MD  12/08/2018, 4:38 AM   I saw and evaluated the patient, participating in the key portions of the service and reviewing pertinent diagnostic studies and records.  I reviewed the nurse practitioner's note and agree with the findings and  the plan.  The assessment and plan were discussed with the patient.  Several questions were asked by the patient and answered.   Nolon Stalls, MD 12/08/2018,4:38 AM

## 2018-12-22 ENCOUNTER — Other Ambulatory Visit: Payer: Self-pay

## 2018-12-27 ENCOUNTER — Inpatient Hospital Stay: Payer: Medicaid Other | Attending: Hematology and Oncology

## 2018-12-27 ENCOUNTER — Inpatient Hospital Stay: Payer: Medicaid Other | Admitting: Hematology and Oncology

## 2018-12-27 NOTE — Progress Notes (Deleted)
[place] [facility] day:  12/27/2018  Chief Complaint: Brandi Bright is a 48 y.o. female with iron deficiency anemia, thrombocytopenia, and leukopenia who is seen for 1 month assessment.  HPI:  The patient was last seen in the hematology clinic on 10/31/2018.  At that time, she felt "pretty good".  She denied any bleeding.  Exam was stable.  Etiology of her pancytopenia was felt likely due to hepatitis C with portal hypertension causing splenomegaly.  She had iron deficiency and B12 deficiency contributing to her anemia.  She had a low normal copper level.  We diiscussed MVI with copper.  She had some variant lymphocytes on peripheral smear.  During the interim,    Past Medical History:  Diagnosis Date  . Arthritis   . Back pain   . Gout   . Hypothyroidism   . MI, old   . Thyroid disease     Past Surgical History:  Procedure Laterality Date  . CARDIAC CATHETERIZATION    . CESAREAN SECTION    . COLONOSCOPY WITH PROPOFOL N/A 03/16/2018   Procedure: COLONOSCOPY WITH PROPOFOL;  Surgeon: Virgel Manifold, MD;  Location: [facility] ENDOSCOPY;  Service: Endoscopy;  Laterality: N/A;  . ESOPHAGOGASTRODUODENOSCOPY (EGD) WITH PROPOFOL N/A 10/27/2018   Procedure: ESOPHAGOGASTRODUODENOSCOPY (EGD) WITH PROPOFOL;  Surgeon: Virgel Manifold, MD;  Location: [facility] ENDOSCOPY;  Service: Endoscopy;  Laterality: N/A;  . TUBAL LIGATION      Family History  Problem Relation Age of Onset  . Hypertension Other   . CAD Father   . Colon cancer Father   . Lung cancer Father   . Pancreatic cancer Brother     Social History:  reports that she has never smoked. She has never used smokeless tobacco. She reports previous alcohol use. She reports that she does not use drugs.  Patient does not smoke. No ETOH in 5 months (04/2018). Patient is "fighting disability" for back and knee pain. Patient denies known exposures to radiation on toxins. The patient is alone  today.  Allergies:  Allergies  Allergen Reactions  . Amoxicillin Rash and Other (See Comments)    Has patient had a PCN reaction causing immediate rash, facial/tongue/throat swelling, SOB or lightheadedness with hypotension: Yes Has patient had a PCN reaction causing severe rash involving mucus membranes or skin necrosis: No Has patient had a PCN reaction that required hospitalization: No Has patient had a PCN reaction occurring within the last 10 years: No If all of the above answers are "NO", then may proceed with Cephalosporin use.   . Gabapentin Rash  . Vicodin [Hydrocodone-Acetaminophen] Rash    Current Medications: Current Outpatient Medications  Medication Sig Dispense Refill  . carvedilol (COREG) 12.5 MG tablet TAKE 1 TABLET (12.5 MG TOTAL) BY MOUTH 2 (TWO) TIMES DAILY WITH A MEAL. 60 tablet 0  . levothyroxine (SYNTHROID, LEVOTHROID) 200 MCG tablet Take 200 mcg by mouth daily.  8  . nitroGLYCERIN (NITROSTAT) 0.4 MG SL tablet Place 0.4 mg under the tongue every 5 (five) minutes x 3 doses as needed for chest pain.    . NON FORMULARY Vitamin B12 with iron one daily    . omeprazole (PRILOSEC) 20 MG capsule Take by mouth daily.  4  . rOPINIRole (REQUIP) 2 MG tablet Take 2 mg by mouth at bedtime.    Marland Kitchen spironolactone (ALDACTONE) 50 MG tablet Take 50 mg by mouth daily.  5  . traZODone (DESYREL) 50 MG tablet Take 50 mg by mouth  at bedtime.    Marland Kitchen venlafaxine XR (EFFEXOR-XR) 75 MG 24 hr capsule Take by mouth daily.  4   No current facility-administered medications for this visit.     Review of Systems:  GENERAL:  Feels "pretty good".  No fevers, sweats.  Feels cold.  Weight up 7 pounds. PERFORMANCE STATUS (ECOG):  1 HEENT:  No visual changes, runny nose, sore throat, mouth sores or tenderness. Lungs: Shortness of breath with exertion.  No cough.  No hemoptysis. Cardiac:  No chest pain, palpitations, orthopnea, or PND. GI:  No nausea, vomiting, diarrhea, constipation, melena or  hematochezia. GU:  No urgency, frequency, dysuria, or hematuria. Musculoskeletal:  No back pain.  Arthritis in wrists, elbows, neck, and ankles.  No muscle tenderness. Extremities:  No pain or swelling. Skin:  No rashes or skin changes. Neuro:  No headache, numbness or weakness, balance or coordination issues. Endocrine:  No diabetes.  Thyroid issues on Synthroid.  No hot flashes or night sweats. Psych:  No mood changes, depression or anxiety. Pain:  No focal pain. Review of systems:  All other systems reviewed and found to be negative.   Physical Exam: There were no vitals taken for this visit. GENERAL:  Well developed, well nourished, woman sitting comfortably in the exam room in no acute distress. MENTAL STATUS:  Alert and oriented to person, place and time. HEAD:  Dark hair.  Normocephalic, atraumatic, face symmetric, no Cushingoid features. EYES:  Blue eyes.  No conjunctivitis or scleral icterus. NEUROLOGICAL: Unremarkable. PSYCH:  Appropriate.    No visits with results within 3 Day(s) from this visit.  Latest known visit with results is:  Orders Only on 10/31/2018  Component Date Value Ref Range Status  . Retic Ct Pct 10/31/2018 1.5  0.4 - 3.1 % Final  . RBC. 10/31/2018 3.60* 3.87 - 5.11 MIL/uL Final  . Retic Count, Absolute 10/31/2018 55.4  19.0 - 186.0 K/uL Final  . Immature Retic Fract 10/31/2018 6.8  2.3 - 15.9 % Final   Performed at [facility], [address], [place], [place] [address]  . WBC 10/31/2018 2.8* 4.0 - 10.5 K/uL Final  . RBC 10/31/2018 3.60* 3.87 - 5.11 MIL/uL Final  . Hemoglobin 10/31/2018 10.1* 12.0 - 15.0 g/dL Final  . HCT 10/31/2018 31.4* 36.0 - 46.0 % Final  . MCV 10/31/2018 87.2  80.0 - 100.0 fL Final  . MCH 10/31/2018 28.1  26.0 - 34.0 pg Final  . MCHC 10/31/2018 32.2  30.0 - 36.0 g/dL Final  . RDW 10/31/2018 15.4  11.5 - 15.5 % Final  . Platelets 10/31/2018 47* 150 - 400 K/uL Final   Comment: Immature Platelet Fraction may be clinically  indicated, consider ordering this additional test [id number]   . nRBC 10/31/2018 0.0  0.0 - 0.2 % Final  . Neutrophils Relative % 10/31/2018 61  % Final  . Neutro Abs 10/31/2018 1.7  1.7 - 7.7 K/uL Final  . Lymphocytes Relative 10/31/2018 30  % Final  . Lymphs Abs 10/31/2018 0.8  0.7 - 4.0 K/uL Final  . Monocytes Relative 10/31/2018 6  % Final  . Monocytes Absolute 10/31/2018 0.2  0.1 - 1.0 K/uL Final  . Eosinophils Relative 10/31/2018 2  % Final  . Eosinophils Absolute 10/31/2018 0.1  0.0 - 0.5 K/uL Final  . Basophils Relative 10/31/2018 1  % Final  . Basophils Absolute 10/31/2018 0.0  0.0 - 0.1 K/uL Final  . Immature Granulocytes 10/31/2018 0  % Final  . Abs Immature Granulocytes  10/31/2018 0.01  0.00 - 0.07 K/uL Final   Performed at [facility], [address] [place], [place] [address]    Assessment:  Brandi Bright is a 48 y.o. female with cirrhosis and hepatitis C.  She has a history of chronic anemia, thrombocytopenia and leukopenia dating back to 03/2012.  Platelet count has fluctuated between 54,000 - 56,000 since 12/28/2017 (previously 73,000 - 134,000).  Ferritin was 14 on 08/31/2018.  She eats meat daily.  Work-up on 09/23/2018 revealed a hematocrit of 31.0, hemoglobin 10.1, MCV 87.6, platelets 46,000, WBC 1900 with an ANC of 1100.  Ferritin was 10 (low) with iron saturation 16% with a TIBC of 401.  Retic was 1.2%.  B12 was 315.  Normal studies included: folate, SPEP, and free light chain ratio.  Copper was 69 (72-166).  ANA was + with double stranded DNA antibody 13 (0-9).  TSH was 0.036 (0.35-4.5) with a free T4 1.05 (0.82-1.77).  Peripheral smear revealed variant lymphocytes.  Ferritin has been followed:  14 on 08/31/2018 and 10 on 09/23/2018.  Abdomen and pelvic CT on 07/14/2018 revealed cirrhosis with portal hypertension noted by prominent splenomegaly (18.5 x 13.8 x 8.1 cm; volume 1100 cm3), enlarged portal veins with recannulated umbilical vein, paraesophageal and  perigastric varices.  There was mild thickening of the cecum and ascending colon.  EGD on 10/27/2018 revealed grade II esophageal varices.  There was erythematous mucosa in the antrum.  There was congested, erythematous, friable (with contact bleeding), granular and nodular mucosa in the gastric fundus and astric body.  There was a single gastric polyp (polypoid ulcerated antral type mucos with chronic active mucosal inflammation).  There was a normal duodenal bulb, second portion of the duodenum and examined duodenum.  The patient's iron deficiency could be explained by her friable gastric mucosa (likely from portal hypertension).  There was no history of variceal bleeding and thus this is not a cause of her iron deficiency.  Symptomatically,  she feels "pretty good".  She denies any bleeding.  Exam is stable.   Plan: 1.  Labs today:  CBC with diff, ferritin, B12, flow cytometry.   2.  Mild panyctopenia:  Etiology likely due to hepatitis C with portal hypertension causing splenomegaly.  Iron deficiency and B12 deficiency contributing to anemia.  Low normal copper level.  Discuss MVI with copper.  No monoclonal gammopathy.   Unclear significance of variant lymphocytes.  Check flow cytometry.  Unclear significance of + ANA.  Contact rheumatology. 3.  Iron deficiency anemia:  Review interval upper endoscopy.    Friable gastric mucosa likely from portal hypertension.  Continue oral iron.  Consider IV iron if no improvement. 4.  B12 deficiency:  B12 borderline on 09/23/2018.  Patient on oral B12.  Check level after 1 month. 5.  RTC in 1 month for MD assessment, labs (CBC with diff, ferritin, B12, flow cytometry), and +/- Venofer.    Lequita Asal, MD  12/27/2018, 5:36 AM   I saw and evaluated the patient, participating in the key portions of the service and reviewing pertinent diagnostic studies and records.  I reviewed the nurse practitioner's note and agree with the findings and  the plan.  The assessment and plan were discussed with the patient.  Several questions were asked by the patient and answered.   Nolon Stalls, MD 12/27/2018,5:36 AM

## 2019-01-21 ENCOUNTER — Other Ambulatory Visit: Payer: Self-pay | Admitting: Gastroenterology

## 2019-01-26 DIAGNOSIS — M542 Cervicalgia: Secondary | ICD-10-CM | POA: Diagnosis not present

## 2019-01-26 DIAGNOSIS — G894 Chronic pain syndrome: Secondary | ICD-10-CM | POA: Diagnosis not present

## 2019-01-26 DIAGNOSIS — M25512 Pain in left shoulder: Secondary | ICD-10-CM | POA: Diagnosis not present

## 2019-01-26 DIAGNOSIS — M545 Low back pain: Secondary | ICD-10-CM | POA: Diagnosis not present

## 2019-02-03 ENCOUNTER — Other Ambulatory Visit: Payer: Self-pay

## 2019-02-06 ENCOUNTER — Inpatient Hospital Stay: Payer: Medicaid Other | Admitting: Oncology

## 2019-02-06 ENCOUNTER — Inpatient Hospital Stay: Payer: Medicaid Other

## 2019-02-06 DIAGNOSIS — M539 Dorsopathy, unspecified: Secondary | ICD-10-CM | POA: Diagnosis not present

## 2019-02-06 DIAGNOSIS — E8881 Metabolic syndrome: Secondary | ICD-10-CM | POA: Diagnosis not present

## 2019-02-06 DIAGNOSIS — I2510 Atherosclerotic heart disease of native coronary artery without angina pectoris: Secondary | ICD-10-CM | POA: Diagnosis not present

## 2019-02-06 DIAGNOSIS — M47812 Spondylosis without myelopathy or radiculopathy, cervical region: Secondary | ICD-10-CM | POA: Diagnosis not present

## 2019-02-09 ENCOUNTER — Other Ambulatory Visit: Payer: Self-pay

## 2019-02-09 ENCOUNTER — Inpatient Hospital Stay: Payer: Medicaid Other | Attending: Oncology

## 2019-02-09 ENCOUNTER — Encounter: Payer: Self-pay | Admitting: Oncology

## 2019-02-09 ENCOUNTER — Inpatient Hospital Stay (HOSPITAL_BASED_OUTPATIENT_CLINIC_OR_DEPARTMENT_OTHER): Payer: Medicaid Other | Admitting: Oncology

## 2019-02-09 ENCOUNTER — Encounter (INDEPENDENT_AMBULATORY_CARE_PROVIDER_SITE_OTHER): Payer: Self-pay

## 2019-02-09 DIAGNOSIS — D509 Iron deficiency anemia, unspecified: Secondary | ICD-10-CM | POA: Diagnosis not present

## 2019-02-09 DIAGNOSIS — I252 Old myocardial infarction: Secondary | ICD-10-CM | POA: Insufficient documentation

## 2019-02-09 DIAGNOSIS — E039 Hypothyroidism, unspecified: Secondary | ICD-10-CM | POA: Diagnosis not present

## 2019-02-09 DIAGNOSIS — R161 Splenomegaly, not elsewhere classified: Secondary | ICD-10-CM

## 2019-02-09 DIAGNOSIS — D72819 Decreased white blood cell count, unspecified: Secondary | ICD-10-CM

## 2019-02-09 DIAGNOSIS — Z79899 Other long term (current) drug therapy: Secondary | ICD-10-CM | POA: Insufficient documentation

## 2019-02-09 DIAGNOSIS — D649 Anemia, unspecified: Secondary | ICD-10-CM

## 2019-02-09 DIAGNOSIS — D696 Thrombocytopenia, unspecified: Secondary | ICD-10-CM

## 2019-02-09 DIAGNOSIS — R790 Abnormal level of blood mineral: Secondary | ICD-10-CM

## 2019-02-09 LAB — CBC WITH DIFFERENTIAL/PLATELET
Abs Immature Granulocytes: 0 10*3/uL (ref 0.00–0.07)
Basophils Absolute: 0 10*3/uL (ref 0.0–0.1)
Basophils Relative: 1 %
Eosinophils Absolute: 0.1 10*3/uL (ref 0.0–0.5)
Eosinophils Relative: 2 %
HCT: 31.8 % — ABNORMAL LOW (ref 36.0–46.0)
Hemoglobin: 10.4 g/dL — ABNORMAL LOW (ref 12.0–15.0)
Immature Granulocytes: 0 %
Lymphocytes Relative: 31 %
Lymphs Abs: 0.6 10*3/uL — ABNORMAL LOW (ref 0.7–4.0)
MCH: 28.9 pg (ref 26.0–34.0)
MCHC: 32.7 g/dL (ref 30.0–36.0)
MCV: 88.3 fL (ref 80.0–100.0)
Monocytes Absolute: 0.2 10*3/uL (ref 0.1–1.0)
Monocytes Relative: 9 %
Neutro Abs: 1.2 10*3/uL — ABNORMAL LOW (ref 1.7–7.7)
Neutrophils Relative %: 57 %
Platelets: 52 10*3/uL — ABNORMAL LOW (ref 150–400)
RBC: 3.6 MIL/uL — ABNORMAL LOW (ref 3.87–5.11)
RDW: 14.6 % (ref 11.5–15.5)
WBC: 2.1 10*3/uL — ABNORMAL LOW (ref 4.0–10.5)
nRBC: 0 % (ref 0.0–0.2)

## 2019-02-09 LAB — VITAMIN B12: Vitamin B-12: 298 pg/mL (ref 180–914)

## 2019-02-09 LAB — FERRITIN: Ferritin: 7 ng/mL — ABNORMAL LOW (ref 11–307)

## 2019-02-09 NOTE — Progress Notes (Signed)
Patient here today as a new patient with Dr Tasia Catchings, transferring care from Dr Mike Gip.

## 2019-02-11 MED ORDER — DOCUSATE SODIUM 100 MG PO CAPS: 100.0000 mg | ORAL_CAPSULE | Freq: Every day | ORAL | 2 refills | Status: DC | PRN

## 2019-02-11 MED ORDER — VITAMIN B-12 1000 MCG PO TABS: 1000.0000 ug | ORAL_TABLET | Freq: Every day | ORAL | 1 refills | Status: DC

## 2019-02-11 MED ORDER — IRON-VITAMIN C 65-125 MG PO TABS: 1.0000 | ORAL_TABLET | Freq: Two times a day (BID) | ORAL | 3 refills | Status: DC

## 2019-02-11 NOTE — Progress Notes (Signed)
[facility] day:  02/11/2019  Chief Complaint: Brandi Bright is a 48 y.o. female follows up for management of iron deficiency anemia, thrombocytopenia, and leukopenia   PERTINENT HEMATOLOGY HISTORY Patient follows up with Dr. Mike Gip previously.  Establish care with me on 02/09/2019. Reviewed patient's previous medical records, labs, imaging results. # History of cirrhosis and hepatitis C.  She has a history of chronic anemia, thrombocytopenia and leukopenia dating back to 03/2012.  Platelet count has fluctuated between 54,000 - 56,000 since 12/28/2017 (previously 73,000 - 134,000).  Ferritin was 14 on 08/31/2018.    Work-up on 09/23/2018 revealed a hematocrit of 31.0, hemoglobin 10.1, MCV 87.6, platelets 46,000, WBC 1900 with an ANC of 1100.  Ferritin was 10 (low) with iron saturation 16% with a TIBC of 401.  Retic was 1.2%.  B12 was 315.  Normal studies included: folate, SPEP, and free light chain ratio.  Copper was 69 (72-166).  ANA was + with double stranded DNA antibody 13 (0-9).  TSH was 0.036 (0.35-4.5) with a free T4 1.05 (0.82-1.77).  Peripheral smear revealed variant lymphocytes.  Ferritin has been followed:  14 on 08/31/2018 and 10 on 09/23/2018.  Abdomen and pelvic CT on 07/14/2018 revealed cirrhosis with portal hypertension noted by prominent splenomegaly (18.5 x 13.8 x 8.1 cm; volume 1100 cm3), enlarged portal veins with recannulated umbilical vein, paraesophageal and perigastric varices.  There was mild thickening of the cecum and ascending colon.  EGD on 10/27/2018 revealed grade II esophageal varices.  There was erythematous mucosa in the antrum.  There was congested, erythematous, friable (with contact bleeding), granular and nodular mucosa in the gastric fundus and astric body.  There was a single gastric polyp (polypoid ulcerated antral type mucos with chronic active mucosal inflammation).  There was a normal duodenal bulb, second  portion of the duodenum and examined duodenum.  The patient's iron deficiency could be explained by her friable gastric mucosa (likely from portal hypertension).  There was no history of variceal bleeding and thus this is not a cause of her iron deficiency.  INTERVAL HISTORY Brandi Bright is a 48 y.o. female who has above history reviewed by me today presents for follow up visit for management of thrombocytopenia, splenomegaly, leukopenia and anemia. Problems and complaints are listed below: Patient reports feeling well at baseline.  Denies hematochezia, hematuria, hematemesis, epistaxis, black tarry stool.  Positive for easy bruising.  Chronic fatigue at baseline. She has no complaints today.  Review of Systems  Constitutional: Negative for appetite change, chills, fatigue and fever.  HENT:   Negative for hearing loss and voice change.   Eyes: Negative for eye problems.  Respiratory: Negative for chest tightness and cough.   Cardiovascular: Negative for chest pain.  Gastrointestinal: Negative for abdominal distention, abdominal pain and blood in stool.  Endocrine: Negative for hot flashes.  Genitourinary: Negative for difficulty urinating and frequency.   Musculoskeletal: Negative for arthralgias.  Skin: Negative for itching and rash.  Neurological: Negative for extremity weakness.  Hematological: Negative for adenopathy. Bruises/bleeds easily.  Psychiatric/Behavioral: Negative for confusion.   Past Medical History:  Diagnosis Date  . Arthritis   . Back pain   . Gout   . Hypothyroidism   . MI, old   . Thyroid disease     Past Surgical History:  Procedure Laterality Date  . CARDIAC CATHETERIZATION    . CESAREAN SECTION    . COLONOSCOPY WITH PROPOFOL N/A 03/16/2018   Procedure: COLONOSCOPY  WITH PROPOFOL;  Surgeon: Virgel Manifold, MD;  Location: [facility] ENDOSCOPY;  Service: Endoscopy;  Laterality: N/A;  . ESOPHAGOGASTRODUODENOSCOPY (EGD) WITH PROPOFOL N/A 10/27/2018    Procedure: ESOPHAGOGASTRODUODENOSCOPY (EGD) WITH PROPOFOL;  Surgeon: Virgel Manifold, MD;  Location: [facility] ENDOSCOPY;  Service: Endoscopy;  Laterality: N/A;  . TUBAL LIGATION      Family History  Problem Relation Age of Onset  . Hypertension Other   . CAD Father   . Colon cancer Father   . Lung cancer Father   . Pancreatic cancer Brother    Social History   Socioeconomic History  . Marital status: Single    Spouse name: Not on file  . Number of children: Not on file  . Years of education: Not on file  . Highest education level: Not on file  Occupational History  . Not on file  Social Needs  . Financial resource strain: Not on file  . Food insecurity:    Worry: Not on file    Inability: Not on file  . Transportation needs:    Medical: Not on file    Non-medical: Not on file  Tobacco Use  . Smoking status: Never Smoker  . Smokeless tobacco: Never Used  Substance and Sexual Activity  . Alcohol use: Not Currently    Comment: has not had a drink in about 5 months  . Drug use: Never  . Sexual activity: Not Currently  Lifestyle  . Physical activity:    Days per week: Not on file    Minutes per session: Not on file  . Stress: Not on file  Relationships  . Social connections:    Talks on phone: Not on file    Gets together: Not on file    Attends religious service: Not on file    Active member of club or organization: Not on file    Attends meetings of clubs or organizations: Not on file    Relationship status: Not on file  . Intimate partner violence:    Fear of current or ex partner: Not on file    Emotionally abused: Not on file    Physically abused: Not on file    Forced sexual activity: Not on file  Other Topics Concern  . Not on file  Social History Narrative  . Not on file   Patient does not smoke. Denies alcohol use. Patient is "fighting disability" for back and knee pain. Patient denies known exposures to radiation on toxins.   Allergies:  Allergies   Allergen Reactions  . Amoxicillin Rash and Other (See Comments)    Has patient had a PCN reaction causing immediate rash, facial/tongue/throat swelling, SOB or lightheadedness with hypotension: Yes Has patient had a PCN reaction causing severe rash involving mucus membranes or skin necrosis: No Has patient had a PCN reaction that required hospitalization: No Has patient had a PCN reaction occurring within the last 10 years: No If all of the above answers are "NO", then may proceed with Cephalosporin use.   . Gabapentin Rash  . Vicodin [Hydrocodone-Acetaminophen] Rash    Current Medications: Current Outpatient Medications  Medication Sig Dispense Refill  . carvedilol (COREG) 12.5 MG tablet TAKE 1 TABLET (12.5 MG TOTAL) BY MOUTH 2 (TWO) TIMES DAILY WITH A MEAL. 60 tablet 0  . levothyroxine (SYNTHROID, LEVOTHROID) 200 MCG tablet Take 200 mcg by mouth daily.  8  . nitroGLYCERIN (NITROSTAT) 0.4 MG SL tablet Place 0.4 mg under the tongue every 5 (five) minutes x 3 doses  as needed for chest pain.    . NON FORMULARY Vitamin B12 with iron one daily    . omeprazole (PRILOSEC) 20 MG capsule Take by mouth daily.  4  . oxyCODONE-acetaminophen (PERCOCET/ROXICET) 5-325 MG tablet TAKE 1 TABLET 4 TIMES A DAY FOR 30 DAYS    . spironolactone (ALDACTONE) 50 MG tablet Take 50 mg by mouth daily.  5  . venlafaxine XR (EFFEXOR-XR) 75 MG 24 hr capsule Take by mouth daily.  4   No current facility-administered medications for this visit.      Physical Exam: There were no vitals taken for this visit. Physical Exam  Constitutional: She is oriented to person, place, and time. No distress.  HENT:  Head: Normocephalic and atraumatic.  Nose: Nose normal.  Mouth/Throat: Oropharynx is clear and moist. No oropharyngeal exudate.  Chronic right frontal mass.  Eyes: Pupils are equal, round, and reactive to light. EOM are normal. No scleral icterus.  Neck: Normal range of motion. Neck supple.  Cardiovascular: Normal  rate and regular rhythm.  No murmur heard. Pulmonary/Chest: Effort normal and breath sounds normal. No respiratory distress. She has no rales. She exhibits no tenderness.  Abdominal: Soft. Bowel sounds are normal. She exhibits no distension. There is no abdominal tenderness.  Musculoskeletal: Normal range of motion.        General: No edema.  Neurological: She is alert and oriented to person, place, and time. No cranial nerve deficit. She exhibits normal muscle tone. Coordination normal.  Skin: Skin is warm and dry. She is not diaphoretic. No erythema.  Psychiatric: Affect normal.    Appointment on 02/09/2019  Component Date Value Ref Range Status  . Vitamin B-12 02/09/2019 298  180 - 914 pg/mL Final   Comment: (NOTE) This assay is not validated for testing neonatal or myeloproliferative syndrome specimens for Vitamin B12 levels. Performed at [facility], [place] [address] [place], [place] [address]   . Ferritin 02/09/2019 7* 11 - 307 ng/mL Final   Performed at [facility], [address] [place], [place] [address]  . WBC 02/09/2019 2.1* 4.0 - 10.5 K/uL Final  . RBC 02/09/2019 3.60* 3.87 - 5.11 MIL/uL Final  . Hemoglobin 02/09/2019 10.4* 12.0 - 15.0 g/dL Final  . HCT 02/09/2019 31.8* 36.0 - 46.0 % Final  . MCV 02/09/2019 88.3  80.0 - 100.0 fL Final  . MCH 02/09/2019 28.9  26.0 - 34.0 pg Final  . MCHC 02/09/2019 32.7  30.0 - 36.0 g/dL Final  . RDW 02/09/2019 14.6  11.5 - 15.5 % Final  . Platelets 02/09/2019 52* 150 - 400 K/uL Final  . nRBC 02/09/2019 0.0  0.0 - 0.2 % Final  . Neutrophils Relative % 02/09/2019 57  % Final  . Neutro Abs 02/09/2019 1.2* 1.7 - 7.7 K/uL Final  . Lymphocytes Relative 02/09/2019 31  % Final  . Lymphs Abs 02/09/2019 0.6* 0.7 - 4.0 K/uL Final  . Monocytes Relative 02/09/2019 9  % Final  . Monocytes Absolute 02/09/2019 0.2  0.1 - 1.0 K/uL Final  . Eosinophils Relative 02/09/2019 2  % Final  . Eosinophils Absolute 02/09/2019 0.1  0.0 - 0.5  K/uL Final  . Basophils Relative 02/09/2019 1  % Final  . Basophils Absolute 02/09/2019 0.0  0.0 - 0.1 K/uL Final  . Immature Granulocytes 02/09/2019 0  % Final  . Abs Immature Granulocytes 02/09/2019 0.00  0.00 - 0.07 K/uL Final   Performed at [facility], [address] [place], [place]  [address]    Assessment:  Brandi Bright is a 48 y.o. female with history of liver cirrhosis, hepatitis C, splenomegaly, portal hypertension, chronic pancytopenia present for follow-up.  1. Thrombocytopenia ([place])   2. Iron deficiency anemia, unspecified iron deficiency anemia type   3. Splenomegaly   4. Leukopenia, unspecified type   5. Low serum copper for age    #Leukopenia and also leukocytopenia secondary to chronic liver disease, cirrhosis, portal hypertension, splenomegaly. Counts are stable.  Continue to monitor.  #Iron deficiency anemia, ferritin decreased to 7.  Patient has been previously advised to take oral iron supplements. Reports only taking multivitamin which contains iron. She prefers to try oral iron tablets before starting IV iron.  Discussed about starting oral supplements with Vitron-C 1 tablet BID Stool softer colace as needed if constipation.  We also discussed that if no improvement after taking iron, I recommend IV iron infusions.   #Low normal vitamin B 12 level, vitamin B12 at 298.  Will start her on vitamin B12 1089mcg daily.  # Unclear significance of + ANA.  She has previously been refer to rheumatology.  Follow up in 2 months for repeat iron TIBC, ferritin, cbc and MD assessment  We spent sufficient time to discuss many aspect of care, questions were answered to patient's satisfaction. Total face to face encounter time for this patient visit was 15 min. >50% of the time was  spent in counseling and coordination of care.   Earlie Server, MD, PhD Hematology Oncology [facility] at [facility] Pager- [phone] 02/11/2019

## 2019-02-13 LAB — COMP PANEL: LEUKEMIA/LYMPHOMA

## 2019-02-16 ENCOUNTER — Ambulatory Visit: Payer: Medicaid Other | Admitting: Gastroenterology

## 2019-02-17 NOTE — Telephone Encounter (Signed)
Further refills for this to come from her cardiologist. Refilling for 30 days only.

## 2019-02-22 ENCOUNTER — Other Ambulatory Visit: Payer: Self-pay

## 2019-02-22 MED ORDER — CARVEDILOL 12.5 MG PO TABS: 12.5000 mg | ORAL_TABLET | Freq: Two times a day (BID) | ORAL | 0 refills | Status: DC

## 2019-02-22 NOTE — Telephone Encounter (Signed)
Pt states her cardiologist had already filled. To disregard. I contacted [facility] and informed them to disregard this medication refill because pt states she already had this filled. Pharmacist stated she has a previous prescription that was a higher strength from Dr. Lavera Guise but not filled. Pharmacist appreciative of call.

## 2019-03-13 ENCOUNTER — Telehealth (INDEPENDENT_AMBULATORY_CARE_PROVIDER_SITE_OTHER): Payer: Medicaid Other | Admitting: Gastroenterology

## 2019-03-13 ENCOUNTER — Other Ambulatory Visit: Payer: Self-pay

## 2019-03-13 DIAGNOSIS — K7469 Other cirrhosis of liver: Secondary | ICD-10-CM

## 2019-03-13 DIAGNOSIS — B182 Chronic viral hepatitis C: Secondary | ICD-10-CM

## 2019-03-13 MED ORDER — LACTULOSE 10 GM/15ML PO SOLN: 10.0000 g | Freq: Every day | ORAL | 3 refills | Status: AC

## 2019-03-13 NOTE — Progress Notes (Signed)
I called pt and informed her that ultrasound was scheduled for 04/05/2019 at the [place] location for 8:45 am, arrival time: 8:30 am. Nothing to eat or drink after midnight. This may have to be rescheduled due to Covid-19 virus in the future. Notified of labs that need to be done in the near future at [facility]. Lactulose sent to pharmacy. And of recall letter sent out for 3 month small bowel caps. That will need to be scheduled.  Will need another televisit in 4 weeks.

## 2019-03-13 NOTE — Progress Notes (Signed)
Brandi Antigua, MD [address]  [place]  [place], [place] [address]  Main: [phone]  Fax: [phone]   Primary Care Physician: Cletis Athens, MD  Virtual Visit via Telephone Note  I connected with patient on 03/13/19 at 10:20 AM EDT by telephone and verified that I am speaking with the correct person using two identifiers.   I discussed the limitations, risks, security and privacy concerns of performing an evaluation and management service by telephone and the availability of in person appointments. I also discussed with the patient that there may be a patient responsible charge related to this service. The patient expressed understanding and agreed to proceed.  Location of Patient: Home Location of Provider: Home Persons involved: Patient and provider only   History of Present Illness: CC: Follow-up for cirrhosis  HPI: Brandi Bright is a 48 y.o. female with history of cirrhosis, who has been noncompliant with recommendations for work-up in the past.  Previously, she was found to be hepatitis C positive and was asked to get blood work done for genotype testing to initiate treatment on previous visits, but she has failed to follow-up with this lab work.   She denies any abdominal pain, abdominal distention, episodes of bleeding, nausea or vomiting, weight loss, altered bowel habits, or blood in stool.  Does report that as of the last 2 to 3 months, she does not have any specific confusion, but does state that she has noticed that it takes her longer to think about certain things.   Had a screening colonoscopy in April 2019 with less than 1 cm polyp removed and repeat recommended in 3 years.  Current Outpatient Medications  Medication Sig Dispense Refill  . carvedilol (COREG) 12.5 MG tablet Take 1 tablet (12.5 mg total) by mouth 2 (two) times daily with a meal. 60 tablet 0  . levothyroxine (SYNTHROID, LEVOTHROID) 200 MCG tablet Take 200 mcg by mouth daily.  8  .  nitroGLYCERIN (NITROSTAT) 0.4 MG SL tablet Place 0.4 mg under the tongue every 5 (five) minutes x 3 doses as needed for chest pain.    . NON FORMULARY Vitamin B12 with iron one daily    . omeprazole (PRILOSEC) 20 MG capsule Take by mouth daily.  4  . oxyCODONE-acetaminophen (PERCOCET/ROXICET) 5-325 MG tablet TAKE 1 TABLET 4 TIMES A DAY FOR 30 DAYS    . spironolactone (ALDACTONE) 50 MG tablet Take 50 mg by mouth daily.  5  . venlafaxine XR (EFFEXOR-XR) 75 MG 24 hr capsule Take by mouth daily.  4  . vitamin B-12 (CYANOCOBALAMIN) 1000 MCG tablet Take 1 tablet (1,000 mcg total) by mouth daily. 90 tablet 1  . lactulose (CHRONULAC) 10 GM/15ML solution Take 15 mLs (10 g total) by mouth daily for 30 days. 450 mL 3   No current facility-administered medications for this visit.     Allergies as of 03/13/2019 - Review Complete 03/13/2019  Allergen Reaction Noted  . Amoxicillin Rash and Other (See Comments) 01/11/2016  . Gabapentin Rash 01/11/2016  . Vicodin [hydrocodone-acetaminophen] Rash 01/11/2016    Review of Systems:    All systems reviewed and negative except where noted in HPI.   Observations/Objective:  Labs: CMP     Component Value Date/Time   NA 139 08/31/2018 1444   NA 137 08/25/2013 0617   K 4.1 08/31/2018 1444   K 4.0 08/25/2013 0617   CL 106 08/31/2018 1444   CL 104 08/25/2013 0617   CO2 23 08/31/2018 1444  CO2 29 08/25/2013 0617   GLUCOSE 95 08/31/2018 1444   GLUCOSE 110 (H) 07/13/2018 2257   GLUCOSE 97 08/25/2013 0617   BUN 9 08/31/2018 1444   BUN 9 08/25/2013 0617   CREATININE 0.48 (L) 08/31/2018 1444   CREATININE 0.68 08/25/2013 0617   CALCIUM 8.5 (L) 08/31/2018 1444   CALCIUM 8.8 08/25/2013 0617   PROT 6.6 08/31/2018 1444   PROT 8.8 (H) 05/30/2013 1242   ALBUMIN 3.2 (L) 08/31/2018 1444   ALBUMIN 4.4 05/30/2013 1242   AST 141 (H) 08/31/2018 1444   AST 163 (H) 05/30/2013 1242   ALT 73 (H) 08/31/2018 1444   ALT 144 (H) 05/30/2013 1242   ALKPHOS 104  08/31/2018 1444   ALKPHOS 89 05/30/2013 1242   BILITOT 0.9 08/31/2018 1444   BILITOT 0.8 05/30/2013 1242   GFRNONAA 116 08/31/2018 1444   GFRNONAA >60 08/25/2013 0617   GFRAA 134 08/31/2018 1444   GFRAA >60 08/25/2013 0617   Lab Results  Component Value Date   WBC 2.1 (L) 02/09/2019   HGB 10.4 (L) 02/09/2019   HCT 31.8 (L) 02/09/2019   MCV 88.3 02/09/2019   PLT 52 (L) 02/09/2019    Imaging Studies: No results found.  Assessment and Plan:   Brandi Bright is a 48 y.o. y/o female with history of cirrhosis and hepatitis C  Assessment and Plan: I again discussed with the patient the importance of hepatitis C follow-up and testing in order to initiate treatment.  We do not have her genotype, and her RNA level needs to be repeated since it has been a while since last one was done.  She agrees to getting this done and this was ordered again.  Last RNA quant was 410,000 and antibody positive.  Hepatitis B serologies negative previously.  Hepatitis A and B serologies consistent with immunity.  Patient following with hematology for iron deficiency and states is compliant with their recommendations for iron replacement.  Colonoscopy is complete and up-to-date.  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  We will also need abdominal ultrasound to evaluate for ascites and also rule out liver lesions for [facility] screening.  We will also start lactulose once a day at this time, patient already reporting 2-3 formed bowel movements a day.  Therefore, will start on low dose and increase as needed.  Patient was notified that the goal of bowel movements should be 2-3 loose bowel movements a day.  If she starts having diarrhea or more frequent bowel movements she is to notify us and we can decrease the medication to every other day  Follow Up Instructions: Will await lab work  Patient will need small bowel capsule study  for iron deficiency anemia, which will need to be done on an elective basis after the current restrictions for elective procedures have been lifted due to the coronavirus outbreak.  [place] visit in 4 weeks to reassess symptoms    I discussed the assessment and treatment plan with the patient. The patient was provided an opportunity to ask questions and all were answered. The patient agreed with the plan and demonstrated an understanding of the instructions.   The patient was advised to call back or seek an in-person evaluation if the symptoms worsen or if the condition fails to improve as anticipated.  I provided 11 minutes of non-face-to-face time during this encounter.   Virgel Manifold, MD  Speech recognition software was  used to dictate this note.

## 2019-03-16 ENCOUNTER — Ambulatory Visit: Payer: Medicaid Other | Admitting: Gastroenterology

## 2019-03-27 ENCOUNTER — Other Ambulatory Visit: Payer: Self-pay

## 2019-03-27 DIAGNOSIS — B182 Chronic viral hepatitis C: Secondary | ICD-10-CM

## 2019-03-27 DIAGNOSIS — K7469 Other cirrhosis of liver: Secondary | ICD-10-CM

## 2019-03-31 ENCOUNTER — Telehealth: Payer: Self-pay | Admitting: Gastroenterology

## 2019-03-31 NOTE — Telephone Encounter (Signed)
Received faxed  conformation  Patient's has been approved for & U/S cpt [id number]. Ihave scanned in onbase & will put a copy on Ginger's desk.

## 2019-04-05 ENCOUNTER — Ambulatory Visit: Payer: Medicaid Other

## 2019-04-06 ENCOUNTER — Other Ambulatory Visit: Payer: Self-pay

## 2019-04-07 ENCOUNTER — Inpatient Hospital Stay: Payer: Medicaid Other | Attending: Oncology

## 2019-04-07 ENCOUNTER — Other Ambulatory Visit: Payer: Self-pay

## 2019-04-07 DIAGNOSIS — D509 Iron deficiency anemia, unspecified: Secondary | ICD-10-CM | POA: Diagnosis not present

## 2019-04-07 DIAGNOSIS — D696 Thrombocytopenia, unspecified: Secondary | ICD-10-CM

## 2019-04-07 LAB — CBC WITH DIFFERENTIAL/PLATELET
Abs Immature Granulocytes: 0 10*3/uL (ref 0.00–0.07)
Basophils Absolute: 0 10*3/uL (ref 0.0–0.1)
Basophils Relative: 1 %
Eosinophils Absolute: 0.1 10*3/uL (ref 0.0–0.5)
Eosinophils Relative: 2 %
HCT: 35.5 % — ABNORMAL LOW (ref 36.0–46.0)
Hemoglobin: 12 g/dL (ref 12.0–15.0)
Immature Granulocytes: 0 %
Lymphocytes Relative: 36 %
Lymphs Abs: 0.9 10*3/uL (ref 0.7–4.0)
MCH: 30.7 pg (ref 26.0–34.0)
MCHC: 33.8 g/dL (ref 30.0–36.0)
MCV: 90.8 fL (ref 80.0–100.0)
Monocytes Absolute: 0.2 10*3/uL (ref 0.1–1.0)
Monocytes Relative: 7 %
Neutro Abs: 1.4 10*3/uL — ABNORMAL LOW (ref 1.7–7.7)
Neutrophils Relative %: 54 %
Platelets: 50 10*3/uL — ABNORMAL LOW (ref 150–400)
RBC: 3.91 MIL/uL (ref 3.87–5.11)
RDW: 15.4 % (ref 11.5–15.5)
WBC: 2.6 10*3/uL — ABNORMAL LOW (ref 4.0–10.5)
nRBC: 0 % (ref 0.0–0.2)

## 2019-04-07 LAB — IRON AND TIBC
Iron: 124 ug/dL (ref 28–170)
Saturation Ratios: 28 % (ref 10.4–31.8)
TIBC: 448 ug/dL (ref 250–450)
UIBC: 324 ug/dL

## 2019-04-07 LAB — FERRITIN: Ferritin: 22 ng/mL (ref 11–307)

## 2019-04-10 ENCOUNTER — Inpatient Hospital Stay: Payer: Medicaid Other | Admitting: Oncology

## 2019-04-10 ENCOUNTER — Other Ambulatory Visit: Payer: Medicaid Other

## 2019-04-11 DIAGNOSIS — M539 Dorsopathy, unspecified: Secondary | ICD-10-CM | POA: Diagnosis not present

## 2019-04-11 DIAGNOSIS — G4700 Insomnia, unspecified: Secondary | ICD-10-CM | POA: Diagnosis not present

## 2019-04-11 DIAGNOSIS — I208 Other forms of angina pectoris: Secondary | ICD-10-CM | POA: Diagnosis not present

## 2019-04-11 DIAGNOSIS — E8881 Metabolic syndrome: Secondary | ICD-10-CM | POA: Diagnosis not present

## 2019-04-18 DIAGNOSIS — M25512 Pain in left shoulder: Secondary | ICD-10-CM | POA: Diagnosis not present

## 2019-04-18 DIAGNOSIS — M542 Cervicalgia: Secondary | ICD-10-CM | POA: Diagnosis not present

## 2019-04-18 DIAGNOSIS — G894 Chronic pain syndrome: Secondary | ICD-10-CM | POA: Diagnosis not present

## 2019-04-18 DIAGNOSIS — M545 Low back pain: Secondary | ICD-10-CM | POA: Diagnosis not present

## 2019-04-24 ENCOUNTER — Encounter: Payer: Self-pay | Admitting: Gastroenterology

## 2019-04-24 ENCOUNTER — Telehealth: Payer: Self-pay | Admitting: Gastroenterology

## 2019-04-24 ENCOUNTER — Ambulatory Visit (INDEPENDENT_AMBULATORY_CARE_PROVIDER_SITE_OTHER): Payer: Medicaid Other | Admitting: Gastroenterology

## 2019-04-24 DIAGNOSIS — B182 Chronic viral hepatitis C: Secondary | ICD-10-CM

## 2019-04-24 DIAGNOSIS — K7460 Unspecified cirrhosis of liver: Secondary | ICD-10-CM | POA: Diagnosis not present

## 2019-04-24 NOTE — Telephone Encounter (Signed)
Left vm to schedule 2 month f/u with Dr. Bonna Gains

## 2019-04-24 NOTE — Patient Instructions (Signed)
Ultrasound scheduled for 04/28/19 (Friday) at 8 am (arrival time: 7:45 am) at the outpatient imaging center on [address]. Nothing to eat or drink after midnight prior to ultrasound. Also to have lab work done. Pt. Aware.

## 2019-04-24 NOTE — Progress Notes (Signed)
Brandi Antigua, MD [address]  [place]  [place],  [address]  Main: [phone]  Fax: [phone]   Primary Care Physician: Cletis Athens, MD  Virtual Visit via Telephone Note  I connected with patient on 04/24/19 at  9:30 AM EDT by telephone and verified that I am speaking with the correct person using two identifiers.   I discussed the limitations, risks, security and privacy concerns of performing an evaluation and management service by telephone and the availability of in person appointments. I also discussed with the patient that there may be a patient responsible charge related to this service. The patient expressed understanding and agreed to proceed.  Location of Patient: Home Location of Provider: Home Persons involved: Patient and provider only   History of Present Illness: Chief Complaint  Patient presents with  . Follow-up    cirrhosis of the liver     HPI: Brandi Bright is a 48 y.o. female with history of cirrhosis and hepatitis C here for follow-up.  Patient has repeatedly failed to follow-up with work-up and recommendations.  Hepatitis C viral load, genotype was ordered more than once in the past and patient was encouraged to get this done and has failed to do so.  Therefore treatment has not been able to be initiated due to this pending work-up.  Lactulose was also ordered on previous visit which patient has not started taking.  Denies any confusion, but does state that she feels she has " slow thinking" at times.  Is on iron replacement by Dr. Tasia Catchings and takes a stool softener due to constipation from this.  The patient denies abdominal or flank pain, anorexia, nausea or vomiting, dysphagia, change in bowel habits or black or bloody stools or weight loss.  Had a screening colonoscopy in April 2019 with less than 1 cm polyp removed and repeat recommended in 3 years.  Variceal screening up-to-date, November 2019.  Patient on Coreg twice daily  for primary prophylaxis.  Current Outpatient Medications  Medication Sig Dispense Refill  . carvedilol (COREG) 12.5 MG tablet Take 1 tablet (12.5 mg total) by mouth 2 (two) times daily with a meal. 60 tablet 0  . levothyroxine (SYNTHROID, LEVOTHROID) 200 MCG tablet Take 200 mcg by mouth daily.  8  . nitroGLYCERIN (NITROSTAT) 0.4 MG SL tablet Place 0.4 mg under the tongue every 5 (five) minutes x 3 doses as needed for chest pain.    . NON FORMULARY Vitamin B12 with iron one daily    . oxyCODONE-acetaminophen (PERCOCET/ROXICET) 5-325 MG tablet TAKE 1 TABLET 4 TIMES A DAY FOR 30 DAYS    . spironolactone (ALDACTONE) 50 MG tablet Take 50 mg by mouth daily.  5  . venlafaxine XR (EFFEXOR-XR) 75 MG 24 hr capsule Take by mouth daily.  4  . vitamin B-12 (CYANOCOBALAMIN) 1000 MCG tablet Take 1 tablet (1,000 mcg total) by mouth daily. 90 tablet 1  . omeprazole (PRILOSEC) 20 MG capsule Take by mouth daily.  4   No current facility-administered medications for this visit.     Allergies as of 04/24/2019 - Review Complete 04/24/2019  Allergen Reaction Noted  . Amoxicillin Rash and Other (See Comments) 01/11/2016  . Gabapentin Rash 01/11/2016  . Vicodin [hydrocodone-acetaminophen] Rash 01/11/2016    Review of Systems:    All systems reviewed and negative except where noted in HPI.   Observations/Objective:  Labs: CMP     Component Value Date/Time   NA 139 08/31/2018 1444   NA  137 08/25/2013 0617   K 4.1 08/31/2018 1444   K 4.0 08/25/2013 0617   CL 106 08/31/2018 1444   CL 104 08/25/2013 0617   CO2 23 08/31/2018 1444   CO2 29 08/25/2013 0617   GLUCOSE 95 08/31/2018 1444   GLUCOSE 110 (H) 07/13/2018 2257   GLUCOSE 97 08/25/2013 0617   BUN 9 08/31/2018 1444   BUN 9 08/25/2013 0617   CREATININE 0.48 (L) 08/31/2018 1444   CREATININE 0.68 08/25/2013 0617   CALCIUM 8.5 (L) 08/31/2018 1444   CALCIUM 8.8 08/25/2013 0617   PROT 6.6 08/31/2018 1444   PROT 8.8 (H) 05/30/2013 1242   ALBUMIN 3.2  (L) 08/31/2018 1444   ALBUMIN 4.4 05/30/2013 1242   AST 141 (H) 08/31/2018 1444   AST 163 (H) 05/30/2013 1242   ALT 73 (H) 08/31/2018 1444   ALT 144 (H) 05/30/2013 1242   ALKPHOS 104 08/31/2018 1444   ALKPHOS 89 05/30/2013 1242   BILITOT 0.9 08/31/2018 1444   BILITOT 0.8 05/30/2013 1242   GFRNONAA 116 08/31/2018 1444   GFRNONAA >60 08/25/2013 0617   GFRAA 134 08/31/2018 1444   GFRAA >60 08/25/2013 0617   Lab Results  Component Value Date   WBC 2.6 (L) 04/07/2019   HGB 12.0 04/07/2019   HCT 35.5 (L) 04/07/2019   MCV 90.8 04/07/2019   PLT 50 (L) 04/07/2019    Imaging Studies: No results found.  Assessment and Plan:   [name] is a 48 y.o. y/o female with cirrhosis and hepatitis C, who was failed to follow-up with work-up and recommendations, therefore delaying hepatitis C treatment  Assessment and Plan: Patient is asymptomatic at this time I have asked her to get her labs done again, which is pending both for hepatitis C genotype and RNA to initiate treatment.  And also meld score.  She denies any signs of decompensation at this time.  Hepatitis A and B immune based on last labs. continue follow-up with hematology  Ultrasound has been ordered and patient encouraged to get this done as well and she verbalized understanding. [place] screening ordered and pending  Patient was asked to discontinue any stool softener she is taking and start the lactulose that she was prescribed.  She states she has it at home but has not started taking it.  I have asked her to start on it once a day and as long as she is having 2-3 bowel movements a day with it to stay on that dose.  She verbalized understanding.  If her bowel movements are loose or more frequent she can cut down the dose to every other day.  She verbalized understanding.  This will help both with her constipation from her iron replacement and with her history of cirrhosis and "slow thinking".  She has not had frank hepatic  encephalopathy.  We discussed the importance of hepatitis C treatment, and the risk of developing liver cancer with hepatitis C untreated.  She verbalized understanding and states she will go to the lab.  Follow Up Instructions: Follow-up in 1 to 2 months   I discussed the assessment and treatment plan with the patient. The patient was provided an opportunity to ask questions and all were answered. The patient agreed with the plan and demonstrated an understanding of the instructions.   The patient was advised to call back or seek an in-person evaluation if the symptoms worsen or if the condition fails to improve as anticipated.  I provided 15 minutes of non-face-to-face time  during this encounter.   Virgel Manifold, MD  Speech recognition software was used to dictate this note.

## 2019-04-28 ENCOUNTER — Ambulatory Visit: Payer: Medicaid Other

## 2019-05-12 DIAGNOSIS — M1712 Unilateral primary osteoarthritis, left knee: Secondary | ICD-10-CM | POA: Diagnosis not present

## 2019-05-12 DIAGNOSIS — M179 Osteoarthritis of knee, unspecified: Secondary | ICD-10-CM | POA: Diagnosis not present

## 2019-05-22 DIAGNOSIS — M25512 Pain in left shoulder: Secondary | ICD-10-CM | POA: Diagnosis not present

## 2019-05-22 DIAGNOSIS — M542 Cervicalgia: Secondary | ICD-10-CM | POA: Diagnosis not present

## 2019-05-22 DIAGNOSIS — G894 Chronic pain syndrome: Secondary | ICD-10-CM | POA: Diagnosis not present

## 2019-05-22 DIAGNOSIS — M545 Low back pain: Secondary | ICD-10-CM | POA: Diagnosis not present

## 2019-06-23 ENCOUNTER — Other Ambulatory Visit: Payer: Self-pay

## 2019-06-23 DIAGNOSIS — D61818 Other pancytopenia: Secondary | ICD-10-CM | POA: Diagnosis not present

## 2019-06-23 DIAGNOSIS — M1990 Unspecified osteoarthritis, unspecified site: Secondary | ICD-10-CM | POA: Diagnosis present

## 2019-06-23 DIAGNOSIS — M109 Gout, unspecified: Secondary | ICD-10-CM | POA: Diagnosis present

## 2019-06-23 DIAGNOSIS — Z885 Allergy status to narcotic agent status: Secondary | ICD-10-CM

## 2019-06-23 DIAGNOSIS — Z79890 Hormone replacement therapy (postmenopausal): Secondary | ICD-10-CM

## 2019-06-23 DIAGNOSIS — I252 Old myocardial infarction: Secondary | ICD-10-CM

## 2019-06-23 DIAGNOSIS — T38995A Adverse effect of other hormone antagonists, initial encounter: Secondary | ICD-10-CM | POA: Diagnosis present

## 2019-06-23 DIAGNOSIS — Z881 Allergy status to other antibiotic agents status: Secondary | ICD-10-CM

## 2019-06-23 DIAGNOSIS — K922 Gastrointestinal hemorrhage, unspecified: Secondary | ICD-10-CM | POA: Diagnosis not present

## 2019-06-23 DIAGNOSIS — Z888 Allergy status to other drugs, medicaments and biological substances status: Secondary | ICD-10-CM

## 2019-06-23 DIAGNOSIS — K766 Portal hypertension: Secondary | ICD-10-CM | POA: Diagnosis present

## 2019-06-23 DIAGNOSIS — Z791 Long term (current) use of non-steroidal anti-inflammatories (NSAID): Secondary | ICD-10-CM

## 2019-06-23 DIAGNOSIS — Z801 Family history of malignant neoplasm of trachea, bronchus and lung: Secondary | ICD-10-CM

## 2019-06-23 DIAGNOSIS — E039 Hypothyroidism, unspecified: Secondary | ICD-10-CM | POA: Diagnosis present

## 2019-06-23 DIAGNOSIS — K7469 Other cirrhosis of liver: Principal | ICD-10-CM | POA: Diagnosis present

## 2019-06-23 DIAGNOSIS — B182 Chronic viral hepatitis C: Secondary | ICD-10-CM | POA: Diagnosis present

## 2019-06-23 DIAGNOSIS — I10 Essential (primary) hypertension: Secondary | ICD-10-CM | POA: Diagnosis present

## 2019-06-23 DIAGNOSIS — Z1159 Encounter for screening for other viral diseases: Secondary | ICD-10-CM

## 2019-06-23 DIAGNOSIS — Z03818 Encounter for observation for suspected exposure to other biological agents ruled out: Secondary | ICD-10-CM | POA: Diagnosis not present

## 2019-06-23 DIAGNOSIS — R001 Bradycardia, unspecified: Secondary | ICD-10-CM | POA: Diagnosis present

## 2019-06-23 DIAGNOSIS — K920 Hematemesis: Secondary | ICD-10-CM | POA: Diagnosis present

## 2019-06-23 DIAGNOSIS — Z800 Family history of malignant neoplasm of digestive organs: Secondary | ICD-10-CM

## 2019-06-23 DIAGNOSIS — I8511 Secondary esophageal varices with bleeding: Secondary | ICD-10-CM | POA: Diagnosis present

## 2019-06-23 DIAGNOSIS — Z8249 Family history of ischemic heart disease and other diseases of the circulatory system: Secondary | ICD-10-CM

## 2019-06-24 ENCOUNTER — Emergency Department: Payer: Medicaid Other

## 2019-06-24 ENCOUNTER — Inpatient Hospital Stay
Admission: EM | Admit: 2019-06-24 | Discharge: 2019-06-26 | DRG: 432 | Disposition: A | Discharge status: Home / Self Care | Payer: Medicaid Other | Attending: Internal Medicine | Admitting: Internal Medicine

## 2019-06-24 ENCOUNTER — Encounter: Payer: Self-pay | Admitting: Emergency Medicine

## 2019-06-24 ENCOUNTER — Other Ambulatory Visit: Payer: Self-pay

## 2019-06-24 DIAGNOSIS — Z1159 Encounter for screening for other viral diseases: Secondary | ICD-10-CM | POA: Diagnosis not present

## 2019-06-24 DIAGNOSIS — Z800 Family history of malignant neoplasm of digestive organs: Secondary | ICD-10-CM | POA: Diagnosis not present

## 2019-06-24 DIAGNOSIS — Z79890 Hormone replacement therapy (postmenopausal): Secondary | ICD-10-CM | POA: Diagnosis not present

## 2019-06-24 DIAGNOSIS — K766 Portal hypertension: Secondary | ICD-10-CM | POA: Diagnosis present

## 2019-06-24 DIAGNOSIS — K922 Gastrointestinal hemorrhage, unspecified: Secondary | ICD-10-CM | POA: Diagnosis not present

## 2019-06-24 DIAGNOSIS — I10 Essential (primary) hypertension: Secondary | ICD-10-CM | POA: Diagnosis not present

## 2019-06-24 DIAGNOSIS — E039 Hypothyroidism, unspecified: Secondary | ICD-10-CM | POA: Diagnosis not present

## 2019-06-24 DIAGNOSIS — D61818 Other pancytopenia: Secondary | ICD-10-CM

## 2019-06-24 DIAGNOSIS — Z791 Long term (current) use of non-steroidal anti-inflammatories (NSAID): Secondary | ICD-10-CM | POA: Diagnosis not present

## 2019-06-24 DIAGNOSIS — Z8249 Family history of ischemic heart disease and other diseases of the circulatory system: Secondary | ICD-10-CM | POA: Diagnosis not present

## 2019-06-24 DIAGNOSIS — K920 Hematemesis: Secondary | ICD-10-CM

## 2019-06-24 DIAGNOSIS — K7469 Other cirrhosis of liver: Secondary | ICD-10-CM | POA: Diagnosis present

## 2019-06-24 DIAGNOSIS — Z881 Allergy status to other antibiotic agents status: Secondary | ICD-10-CM | POA: Diagnosis not present

## 2019-06-24 DIAGNOSIS — T38995A Adverse effect of other hormone antagonists, initial encounter: Secondary | ICD-10-CM | POA: Diagnosis present

## 2019-06-24 DIAGNOSIS — B182 Chronic viral hepatitis C: Secondary | ICD-10-CM | POA: Diagnosis present

## 2019-06-24 DIAGNOSIS — Z03818 Encounter for observation for suspected exposure to other biological agents ruled out: Secondary | ICD-10-CM | POA: Diagnosis not present

## 2019-06-24 DIAGNOSIS — K7460 Unspecified cirrhosis of liver: Secondary | ICD-10-CM | POA: Diagnosis not present

## 2019-06-24 DIAGNOSIS — Z885 Allergy status to narcotic agent status: Secondary | ICD-10-CM | POA: Diagnosis not present

## 2019-06-24 DIAGNOSIS — Z801 Family history of malignant neoplasm of trachea, bronchus and lung: Secondary | ICD-10-CM | POA: Diagnosis not present

## 2019-06-24 DIAGNOSIS — I252 Old myocardial infarction: Secondary | ICD-10-CM | POA: Diagnosis not present

## 2019-06-24 DIAGNOSIS — R55 Syncope and collapse: Secondary | ICD-10-CM | POA: Diagnosis not present

## 2019-06-24 DIAGNOSIS — R001 Bradycardia, unspecified: Secondary | ICD-10-CM | POA: Diagnosis not present

## 2019-06-24 DIAGNOSIS — R1013 Epigastric pain: Secondary | ICD-10-CM | POA: Diagnosis present

## 2019-06-24 DIAGNOSIS — M109 Gout, unspecified: Secondary | ICD-10-CM | POA: Diagnosis present

## 2019-06-24 DIAGNOSIS — M1990 Unspecified osteoarthritis, unspecified site: Secondary | ICD-10-CM | POA: Diagnosis present

## 2019-06-24 DIAGNOSIS — I8511 Secondary esophageal varices with bleeding: Secondary | ICD-10-CM | POA: Diagnosis present

## 2019-06-24 DIAGNOSIS — Z888 Allergy status to other drugs, medicaments and biological substances status: Secondary | ICD-10-CM | POA: Diagnosis not present

## 2019-06-24 HISTORY — DX: Gastrointestinal hemorrhage, unspecified: K92.2

## 2019-06-24 LAB — TROPONIN I (HIGH SENSITIVITY)
Troponin I (High Sensitivity): 25 ng/L — ABNORMAL HIGH (ref <18)
Troponin I (High Sensitivity): 25 ng/L — ABNORMAL HIGH (ref <18)

## 2019-06-24 LAB — URINALYSIS, COMPLETE (UACMP) WITH MICROSCOPIC
Bacteria, UA: NONE SEEN
Bilirubin Urine: NEGATIVE
Glucose, UA: NEGATIVE mg/dL
Hgb urine dipstick: NEGATIVE
Ketones, ur: NEGATIVE mg/dL
Leukocytes,Ua: NEGATIVE
Nitrite: NEGATIVE
Protein, ur: NEGATIVE mg/dL
Specific Gravity, Urine: 1.006 (ref 1.005–1.030)
pH: 6 (ref 5.0–8.0)

## 2019-06-24 LAB — CBC
HCT: 36.7 % (ref 36.0–46.0)
Hemoglobin: 12.1 g/dL (ref 12.0–15.0)
MCH: 31 pg (ref 26.0–34.0)
MCHC: 33 g/dL (ref 30.0–36.0)
MCV: 94.1 fL (ref 80.0–100.0)
Platelets: 40 10*3/uL — ABNORMAL LOW (ref 150–400)
RBC: 3.9 MIL/uL (ref 3.87–5.11)
RDW: 15.4 % (ref 11.5–15.5)
WBC: 1.8 10*3/uL — ABNORMAL LOW (ref 4.0–10.5)
nRBC: 0 % (ref 0.0–0.2)

## 2019-06-24 LAB — SAMPLE TO BLOOD BANK

## 2019-06-24 LAB — COMPREHENSIVE METABOLIC PANEL
ALT: 154 U/L — ABNORMAL HIGH (ref 0–44)
AST: 193 U/L — ABNORMAL HIGH (ref 15–41)
Albumin: 3.3 g/dL — ABNORMAL LOW (ref 3.5–5.0)
Alkaline Phosphatase: 86 U/L (ref 38–126)
Anion gap: 5 (ref 5–15)
BUN: 14 mg/dL (ref 6–20)
CO2: 26 mmol/L (ref 22–32)
Calcium: 9 mg/dL (ref 8.9–10.3)
Chloride: 109 mmol/L (ref 98–111)
Creatinine, Ser: 0.63 mg/dL (ref 0.44–1.00)
GFR calc Af Amer: >60 mL/min (ref >60)
GFR calc non Af Amer: >60 mL/min (ref >60)
Glucose, Bld: 113 mg/dL — ABNORMAL HIGH (ref 70–99)
Potassium: 3.8 mmol/L (ref 3.5–5.1)
Sodium: 140 mmol/L (ref 135–145)
Total Bilirubin: 1.1 mg/dL (ref 0.3–1.2)
Total Protein: 7 g/dL (ref 6.5–8.1)

## 2019-06-24 LAB — SARS CORONAVIRUS 2 BY RT PCR (HOSPITAL ORDER, PERFORMED IN ~~LOC~~ HOSPITAL LAB): SARS Coronavirus 2: NEGATIVE

## 2019-06-24 LAB — TSH: TSH: 2.53 u[IU]/mL (ref 0.350–4.500)

## 2019-06-24 LAB — LIPASE, BLOOD: Lipase: 63 U/L — ABNORMAL HIGH (ref 11–51)

## 2019-06-24 MED ORDER — LEVOTHYROXINE SODIUM 200 MCG PO TABS
200.0000 ug | ORAL_TABLET | Freq: Every day | ORAL | Status: DC
  Administered 2019-06-26: 200 ug via ORAL
  Filled 2019-06-24: qty 1
  Filled 2019-06-24: qty 2
  Filled 2019-06-24: qty 1

## 2019-06-24 MED ORDER — OXYCODONE-ACETAMINOPHEN 5-325 MG PO TABS: 1.0000 | ORAL_TABLET | Freq: Four times a day (QID) | ORAL | Status: DC

## 2019-06-24 MED ORDER — OXYCODONE-ACETAMINOPHEN 5-325 MG PO TABS
1.0000 | ORAL_TABLET | Freq: Four times a day (QID) | ORAL | Status: DC | PRN
  Administered 2019-06-24 – 2019-06-26 (×3): 1 via ORAL
  Filled 2019-06-24 (×3): qty 1

## 2019-06-24 MED ORDER — MORPHINE SULFATE (PF) 2 MG/ML IV SOLN
2.0000 mg | Freq: Once | INTRAVENOUS | Status: AC
  Administered 2019-06-24: 2 mg via INTRAVENOUS
  Filled 2019-06-24: qty 1

## 2019-06-24 MED ORDER — IOHEXOL 300 MG/ML  SOLN
100.0000 mL | Freq: Once | INTRAMUSCULAR | Status: AC | PRN
  Administered 2019-06-24: 100 mL via INTRAVENOUS

## 2019-06-24 MED ORDER — LACTULOSE 10 GM/15ML PO SOLN: 10.0000 g | Freq: Every day | ORAL | Status: DC | PRN

## 2019-06-24 MED ORDER — OCTREOTIDE ACETATE 100 MCG/ML IJ SOLN
100.0000 ug | Freq: Once | INTRAMUSCULAR | Status: AC
  Administered 2019-06-24: 100 ug via INTRAVENOUS
  Filled 2019-06-24: qty 1

## 2019-06-24 MED ORDER — SODIUM CHLORIDE 0.9 % IV SOLN
50.0000 ug/h | INTRAVENOUS | Status: DC
  Administered 2019-06-24 – 2019-06-25 (×2): 50 ug/h via INTRAVENOUS
  Filled 2019-06-24 (×4): qty 1

## 2019-06-24 MED ORDER — SODIUM CHLORIDE 0.9 % IV SOLN
INTRAVENOUS | Status: DC
  Administered 2019-06-24: via INTRAVENOUS

## 2019-06-24 MED ORDER — ONDANSETRON HCL 4 MG/2ML IJ SOLN
4.0000 mg | Freq: Once | INTRAMUSCULAR | Status: AC
  Administered 2019-06-24: 4 mg via INTRAVENOUS
  Filled 2019-06-24: qty 2

## 2019-06-24 MED ORDER — SPIRONOLACTONE 25 MG PO TABS
50.0000 mg | ORAL_TABLET | Freq: Every day | ORAL | Status: DC
  Administered 2019-06-24 – 2019-06-26 (×3): 50 mg via ORAL
  Filled 2019-06-24 (×3): qty 2

## 2019-06-24 MED ORDER — CARVEDILOL 12.5 MG PO TABS: 12.5000 mg | ORAL_TABLET | Freq: Two times a day (BID) | ORAL | Status: DC

## 2019-06-24 MED ORDER — CIPROFLOXACIN IN D5W 400 MG/200ML IV SOLN
400.0000 mg | Freq: Two times a day (BID) | INTRAVENOUS | Status: DC
  Administered 2019-06-24 – 2019-06-26 (×4): 400 mg via INTRAVENOUS
  Filled 2019-06-24 (×6): qty 200

## 2019-06-24 MED ORDER — PANTOPRAZOLE SODIUM 40 MG IV SOLR: 40.0000 mg | Freq: Two times a day (BID) | INTRAVENOUS | Status: DC

## 2019-06-24 MED ORDER — DOCUSATE SODIUM 100 MG PO CAPS: 100.0000 mg | ORAL_CAPSULE | Freq: Two times a day (BID) | ORAL | Status: DC | PRN

## 2019-06-24 MED ORDER — SODIUM CHLORIDE 0.9 % IV SOLN
8.0000 mg/h | INTRAVENOUS | Status: DC
  Administered 2019-06-24 – 2019-06-25 (×3): 8 mg/h via INTRAVENOUS
  Filled 2019-06-24 (×3): qty 80

## 2019-06-24 MED ORDER — SODIUM CHLORIDE 0.9 % IV SOLN
80.0000 mg | Freq: Once | INTRAVENOUS | Status: AC
  Administered 2019-06-24: 80 mg via INTRAVENOUS
  Filled 2019-06-24: qty 80

## 2019-06-24 NOTE — ED Provider Notes (Signed)
[facility] Emergency Department Provider Note   First MD Initiated Contact with Patient 06/24/19 [phone]     (approximate)  I have reviewed the triage vital signs and the nursing notes.   HISTORY  Chief Complaint Abdominal Pain, Nausea, and Emesis    HPI Brandi Bright is a 48 y.o. female with below list of previous medical conditions including cirrhosis of the liver presents to the emergency department with acute onset of epigastric abdominal pain with 3 episodes of vomiting.  Patient states that there was  blood in the first vomit however none in subsequent episodes.  Patient denies any chest pain or shortness of breath.  Patient states that current pain score is mild 5 out of 10.  Patient denies any diarrhea constipation.  Patient denies any urinary symptoms.       Past Medical History:  Diagnosis Date  . Arthritis   . Back pain   . Gout   . Hypothyroidism   . MI, old   . Thyroid disease     Patient Active Problem List   Diagnosis Date Noted  . Iron deficiency anemia 10/31/2018  . Low vitamin B12 level 10/31/2018  . Low serum copper for age 19/18/2019  . Positive ANA (antinuclear antibody) 10/31/2018  . Other cirrhosis of liver ([place])   . Secondary esophageal varices without bleeding ([place])   . Stomach irritation   . Gastric polyp   . Normocytic anemia 09/23/2018  . Thrombocytopenia ([place]) 09/23/2018  . Leukopenia 09/23/2018  . Splenomegaly 09/23/2018  . Colon cancer screening   . Benign neoplasm of descending colon   . Benign neoplasm of ascending colon   . Family history of malignant neoplasm of gastrointestinal tract   . Benign neoplasm of transverse colon   . Polyp of sigmoid colon   . Chest pain 12/29/2017    Past Surgical History:  Procedure Laterality Date  . CARDIAC CATHETERIZATION    . CESAREAN SECTION    . COLONOSCOPY WITH PROPOFOL N/A 03/16/2018   Procedure: COLONOSCOPY WITH PROPOFOL;  Surgeon: Virgel Manifold, MD;   Location: [facility] ENDOSCOPY;  Service: Endoscopy;  Laterality: N/A;  . ESOPHAGOGASTRODUODENOSCOPY (EGD) WITH PROPOFOL N/A 10/27/2018   Procedure: ESOPHAGOGASTRODUODENOSCOPY (EGD) WITH PROPOFOL;  Surgeon: Virgel Manifold, MD;  Location: [facility] ENDOSCOPY;  Service: Endoscopy;  Laterality: N/A;  . TUBAL LIGATION      Prior to Admission medications   Medication Sig Start Date End Date Taking? Authorizing Provider  carvedilol (COREG) 12.5 MG tablet Take 1 tablet (12.5 mg total) by mouth 2 (two) times daily with a meal. 02/22/19   [name] B, MD  levothyroxine (SYNTHROID, LEVOTHROID) 200 MCG tablet Take 200 mcg by mouth daily. 11/26/17   [provider]  nitroGLYCERIN (NITROSTAT) 0.4 MG SL tablet Place 0.4 mg under the tongue every 5 (five) minutes x 3 doses as needed for chest pain.    [provider]  NON FORMULARY Vitamin B12 with iron one daily    [provider]  omeprazole (PRILOSEC) 20 MG capsule Take by mouth daily. 07/26/18   [provider]  oxyCODONE-acetaminophen (PERCOCET/ROXICET) 5-325 MG tablet TAKE 1 TABLET 4 TIMES A DAY FOR 30 DAYS 01/26/19   [provider]  spironolactone (ALDACTONE) 50 MG tablet Take 50 mg by mouth daily. 08/21/18   [provider]  venlafaxine XR (EFFEXOR-XR) 75 MG 24 hr capsule Take by mouth daily. 08/22/18   [provider]  vitamin B-12 (CYANOCOBALAMIN) 1000 MCG tablet Take  1 tablet (1,000 mcg total) by mouth daily. 02/11/19   Earlie Server, MD    Allergies Amoxicillin, Gabapentin, and Vicodin [hydrocodone-acetaminophen]  Family History  Problem Relation Age of Onset  . Hypertension Other   . CAD Father   . Colon cancer Father   . Lung cancer Father   . Pancreatic cancer Brother     Social History Social History   Tobacco Use  . Smoking status: Never Smoker  . Smokeless tobacco: Never Used  Substance Use Topics  . Alcohol use: Not Currently    Comment: has not had a drink in about 5  months  . Drug use: Never    Review of Systems Constitutional: No fever/chills Eyes: No visual changes. ENT: No sore throat. Cardiovascular: Denies chest pain. Respiratory: Denies shortness of breath. Gastrointestinal: Positive for abdominal pain.  No nausea, no vomiting.  No diarrhea.  No constipation. Genitourinary: Negative for dysuria. Musculoskeletal: Negative for neck pain.  Negative for back pain. Integumentary: Negative for rash. Neurological: Negative for headaches, focal weakness or numbness.   ____________________________________________   PHYSICAL EXAM:  VITAL SIGNS: ED Triage Vitals  Enc Vitals Group     BP 06/24/19 0014 121/68     Pulse Rate 06/24/19 0014 (!) 55     Resp 06/24/19 0014 18     Temp 06/24/19 0014 98.5 F (36.9 C)     Temp Source 06/24/19 0014 Oral     SpO2 06/24/19 0014 96 %     Weight 06/24/19 0014 78 kg (172 lb)     Height 06/24/19 0014 1.448 m (4\' 9" )     Head Circumference --      Peak Flow --      Pain Score 06/24/19 0034 6     Pain Loc --      Pain Edu? --      Excl. in [place]? --     Constitutional: Alert and oriented. Well appearing and in no acute distress. Eyes: Conjunctivae are normal. PERRL. EOMI. Mouth/Throat: Mucous membranes are moist.  Oropharynx non-erythematous. Neck: No stridor.   Cardiovascular: Normal rate, regular rhythm. Good peripheral circulation. Grossly normal heart sounds. Respiratory: Normal respiratory effort.  No retractions. No audible wheezing. Gastrointestinal: Gastric tenderness to palpation.. No distention.  Musculoskeletal: No lower extremity tenderness nor edema. No gross deformities of extremities. Neurologic:  Normal speech and language. No gross focal neurologic deficits are appreciated.  Skin:  Skin is warm, dry and intact. No rash noted. Psychiatric: Mood and affect are normal. Speech and behavior are normal.  ____________________________________________   LABS (all labs ordered are listed, but  only abnormal results are displayed)  Labs Reviewed  LIPASE, BLOOD - Abnormal; Notable for the following components:      Result Value   Lipase 63 (*)    All other components within normal limits  COMPREHENSIVE METABOLIC PANEL - Abnormal; Notable for the following components:   Glucose, Bld 113 (*)    Albumin 3.3 (*)    AST 193 (*)    ALT 154 (*)    All other components within normal limits  CBC - Abnormal; Notable for the following components:   WBC 1.8 (*)    Platelets 40 (*)    All other components within normal limits  URINALYSIS, COMPLETE (UACMP) WITH MICROSCOPIC - Abnormal; Notable for the following components:   Color, Urine YELLOW (*)    APPearance CLEAR (*)    All other components within normal limits   ____________________________________________  EKG  ED  ECG REPORT I, [place] Dending[place], the attending physician, personally viewed and interpreted this ECG.   Date: 06/24/2019  EKG Time: 12:30 AM  Rate: 61  Rhythm: Normal sinus rhythm  Axis: Normal  Intervals: Normal  ST&T Change: None  ____________________________________________  RADIOLOGY I, [place] Dottie Vaquerano, personally viewed and evaluated these images (plain radiographs) as part of my medical decision making, as well as reviewing the written report by the radiologist.  ED MD interpretation: No acute findings noted on CT cirrhosis of the liver with portal venous hypertension again noted.  Official radiology report(s): Ct Abdomen Pelvis W Contrast  Result Date: 06/24/2019 CLINICAL DATA:  Pt reports she was laying on the couch around 1030 pm when she became dizzy, developed n/v and epigastric pain. Reports she spit up about 3 times. The first time she reports it was a little bloody looking. Denies shortness of breath or diaphoresis with the painHx: tubal ligation, c-section. EXAM: CT ABDOMEN AND PELVIS WITH CONTRAST TECHNIQUE: Multidetector CT imaging of the abdomen and pelvis was performed using the standard  protocol following bolus administration of intravenous contrast. CONTRAST:  134mL OMNIPAQUE IOHEXOL 300 MG/ML  SOLN COMPARISON:  07/14/2018 FINDINGS: Lower chest: Clear lung bases.  Heart normal in size. Hepatobiliary: Liver shows central volume loss and surface nodularity consistent with cirrhosis. No liver mass or focal lesion. Normal gallbladder. No bile duct dilation. Pancreas: Unremarkable. No pancreatic ductal dilatation or surrounding inflammatory changes. Spleen: Enlarged spleen measuring 18.9 x 6.7 x 14.4 cm. No splenic mass or focal lesion. Adrenals/Urinary Tract: Adrenal glands are unremarkable. Kidneys are normal, without renal calculi, focal lesion, or hydronephrosis. Bladder is unremarkable. Stomach/Bowel: Normal stomach. Small bowel and colon are normal in caliber. No wall thickening or inflammation. No evidence of appendicitis. Vascular/Lymphatic: There are prominent upper abdominal venous collaterals, extending along the anterior surface of the liver to the umbilicus, and along the coronary vein to merge with para softened she will varices. These findings are stable from the prior CT. Mild aortic atherosclerosis. No other vascular abnormality. No pathologically enlarged lymph nodes. Reproductive: Uterus and bilateral adnexa are unremarkable. Other: No abdominal wall hernia or abnormality. No abdominopelvic ascites. Musculoskeletal: No fracture or acute finding. No osteoblastic or osteolytic lesions. IMPRESSION: 1. No acute findings. 2. Cirrhosis with portal venous hypertension reflected by splenomegaly and venous collaterals, similar to the prior CT. No ascites on the current exam. 3. Mild aortic atherosclerosis. Electronically Signed   By: Lajean Manes M.D.   On: 06/24/2019 04:24      Procedures   ____________________________________________   INITIAL IMPRESSION / MDM / [place] / ED COURSE  As part of my medical decision making, I reviewed the following data within the  electronic MEDICAL RECORD NUMBER 48 year old with above-stated history and physical exam secondary to epigastric abdominal pain with 3 episodes of vomiting 1 of which contained blood.  Concern for possible esophageal variceal bleed versus gastritis gastric ulcer pancreatitis.  CT scan revealed no acute pathology.  Patient had no further vomiting while in the emergency department.  Patient laboratory data concerning secondary to pancytopenia with a white blood cell count of 1.8 which is below the patient's baseline previous of 2.6 in addition patient's platelet count 40 which is below her previous platelet count of 50.  Given concern for possible recurrence of GI bleed with a platelet count of 40 patient discussed with Dr. Marcille Blanco for hospital admission for further evaluation and management.  Patient given Protonix IV and octreotide. ____________________________________________  FINAL  CLINICAL IMPRESSION(S) / ED DIAGNOSES  Final diagnoses:  Gastrointestinal hemorrhage, unspecified gastrointestinal hemorrhage type  Pancytopenia ([place])     MEDICATIONS GIVEN DURING THIS VISIT:  Medications  morphine 2 MG/ML injection 2 mg (2 mg Intravenous Given 06/24/19 0349)  ondansetron (ZOFRAN) injection 4 mg (4 mg Intravenous Given 06/24/19 0349)  iohexol (OMNIPAQUE) 300 MG/ML solution 100 mL (100 mLs Intravenous Contrast Given 06/24/19 0359)     ED Discharge Orders    None      *Please note:  SUZANN LAZARO was evaluated in Emergency Department on 06/24/2019 for the symptoms described in the history of present illness. She was evaluated in the context of the global COVID-19 pandemic, which necessitated consideration that the patient might be at risk for infection with the SARS-CoV-2 virus that causes COVID-19. Institutional protocols and algorithms that pertain to the evaluation of patients at risk for COVID-19 are in a state of rapid change based on information released by regulatory bodies including the CDC and  federal and state organizations. These policies and algorithms were followed during the patient's care in the ED.  Some ED evaluations and interventions may be delayed as a result of limited staffing during the pandemic.*  Note:  This document was prepared using Dragon voice recognition software and may include unintentional dictation errors.   Gregor Hams, MD 06/24/19 [id number]

## 2019-06-24 NOTE — ED Notes (Signed)
HR brady down into 20s.  Dr Corky Downs notified. BP remains WNL. Pt asymptomatic.  Pt remains alert and oriented, no CP/SHOB noted. Color WNL.

## 2019-06-24 NOTE — Consult Note (Signed)
Jonathon Bellows , MD [address] [place], [place], [place], [id number] [place], [place], [place], [address] Phone: [phone]  Fax: [phone]  Consultation  Referring Provider:  Dr Marthann Schiller  Primary Care Physician:  Cletis Athens, MD Primary Gastroenterologist:  Dr. Bonna Gains         Reason for Consultation:     Hematemesis  Date of Admission:  06/24/2019 Date of Consultation:  06/24/2019         HPI:   Brandi Bright is a 48 y.o. female who has a history of liver cirrhosis from chronic hepatitis C and treatment naive. Has not followed up with Dr Bonna Gains. Had not been compliant with lactulose .  Known to have grade 2 esophageal varices that have not bled on Coreg BID for prophylaxis.   She presented to the ER with abdominal pain and vomiting with some blood seen in the first episode and none later. CT scan of the abdomen showed no acute changes but features of portal hypertension.   Hb stable on admission . No elevation of BUN/Cr ratio. She refused COVID testing in the ER.  Says she had 1 episode of 1 teaspoon of blood in the vomitus yesterday, none after, green stool, no abdominal pain. No NSAID or alcohol use. Feels well righ now   BMP Latest Ref Rng & Units 06/24/2019 08/31/2018 07/13/2018  Glucose 70 - 99 mg/dL 113(H) 95 110(H)  BUN 6 - 20 mg/dL 14 9 6   Creatinine 0.44 - 1.00 mg/dL 0.63 0.48(L) 0.70  BUN/Creat Ratio 9 - 23 - 19 -  Sodium 135 - 145 mmol/L 140 139 138  Potassium 3.5 - 5.1 mmol/L 3.8 4.1 3.6  Chloride 98 - 111 mmol/L 109 106 109  CO2 22 - 32 mmol/L 26 23 23   Calcium 8.9 - 10.3 mg/dL 9.0 8.5(L) 8.3(L)     Past Medical History:  Diagnosis Date   Arthritis    Back pain    Gout    Hypothyroidism    MI, old    Thyroid disease     Past Surgical History:  Procedure Laterality Date   CARDIAC CATHETERIZATION     CESAREAN SECTION     COLONOSCOPY WITH PROPOFOL N/A 03/16/2018   Procedure: COLONOSCOPY WITH PROPOFOL;  Surgeon:  Virgel Manifold, MD;  Location: [facility] ENDOSCOPY;  Service: Endoscopy;  Laterality: N/A;   ESOPHAGOGASTRODUODENOSCOPY (EGD) WITH PROPOFOL N/A 10/27/2018   Procedure: ESOPHAGOGASTRODUODENOSCOPY (EGD) WITH PROPOFOL;  Surgeon: Virgel Manifold, MD;  Location: [facility] ENDOSCOPY;  Service: Endoscopy;  Laterality: N/A;   TUBAL LIGATION      Prior to Admission medications   Medication Sig Start Date End Date Taking? Authorizing Provider  carvedilol (COREG) 12.5 MG tablet Take 1 tablet (12.5 mg total) by mouth 2 (two) times daily with a meal. 02/22/19  Yes Tahiliani, Margretta Sidle B, MD  diclofenac sodium (VOLTAREN) 1 % GEL Apply 5 g topically 4 (four) times daily. 06/06/19  Yes [provider]  Iron-Vitamin C (VITRON-C) 65-125 MG TABS Take 1 tablet by mouth 2 (two) times a day.   Yes [provider]  lactulose (CHRONULAC) 10 GM/15ML solution Take 10 g by mouth daily as needed for mild constipation.  05/02/19  Yes [provider]  levothyroxine (SYNTHROID, LEVOTHROID) 200 MCG tablet Take 200 mcg by mouth daily. 11/26/17  Yes [provider]  meloxicam (MOBIC) 7.5 MG tablet Take 7.5 mg by mouth 2 (two) times a day.   Yes [provider]  nitroGLYCERIN (NITROLINGUAL) 0.4 MG/SPRAY spray Place 1 spray under the tongue every 5 (five) minutes x 3 doses as needed for chest pain.   Yes [provider]  omeprazole (PRILOSEC) 20 MG capsule Take 20 mg by mouth daily.  07/26/18  Yes [provider]  oxyCODONE-acetaminophen (PERCOCET/ROXICET) 5-325 MG tablet Take 1 tablet by mouth 4 (four) times daily.  01/26/19  Yes [provider]  spironolactone (ALDACTONE) 50 MG tablet Take 50 mg by mouth daily. 08/21/18  Yes [provider]  venlafaxine XR (EFFEXOR-XR) 75 MG 24 hr capsule Take 75 mg by mouth daily.  08/22/18  Yes [provider]  vitamin B-12 (CYANOCOBALAMIN) 1000 MCG tablet Take 1 tablet (1,000 mcg total) by mouth daily. 02/11/19   Yes Earlie Server, MD    Family History  Problem Relation Age of Onset   Hypertension Other    CAD Father    Colon cancer Father    Lung cancer Father    Pancreatic cancer Brother      Social History   Tobacco Use   Smoking status: Never Smoker   Smokeless tobacco: Never Used  Substance Use Topics   Alcohol use: Not Currently    Comment: has not had a drink in about 5 months   Drug use: Never    Allergies as of 06/23/2019 - Review Complete 04/24/2019  Allergen Reaction Noted   Amoxicillin Rash and Other (See Comments) 01/11/2016   Gabapentin Rash 01/11/2016   Vicodin [hydrocodone-acetaminophen] Rash 01/11/2016    Review of Systems:    All systems reviewed and negative except where noted in HPI.   Physical Exam:  Vital signs in last 24 hours: Temp:  [97.7 F (36.5 C)-98.5 F (36.9 C)] 97.7 F (36.5 C) (07/11 1036) Pulse Rate:  [29-55] 47 (07/11 1036) Resp:  [12-21] 18 (07/11 1036) BP: (96-162)/(65-87) 107/66 (07/11 1036) SpO2:  [91 %-99 %] 99 % (07/11 1036) Weight:  [78 kg] 78 kg (07/11 0014)   General:   Pleasant, cooperative in NAD Head:  Normocephalic and atraumatic. Eyes:   No icterus.   Conjunctiva pink. PERRLA. Ears:  Normal auditory acuity. Neck:  Supple; no masses or thyroidomegaly Lungs: Respirations even and unlabored. Lungs clear to auscultation bilaterally.   No wheezes, crackles, or rhonchi.  Heart:  Regular rate and rhythm;  Without murmur, clicks, rubs or gallops Abdomen:  Soft, nondistended, nontender. Normal bowel sounds. No appreciable masses or hepatomegaly.  No rebound or guarding.  Neurologic:  Alert and oriented x3;  grossly normal neurologically. Skin:  Intact without significant lesions or rashes. Cervical Nodes:  No significant cervical adenopathy. Psych:  Alert and cooperative. Normal affect.  LAB RESULTS: Recent Labs    06/24/19 0026  WBC 1.8*  HGB 12.1  HCT 36.7  PLT 40*   BMET Recent Labs    06/24/19 0026  NA  140  K 3.8  CL 109  CO2 26  GLUCOSE 113*  BUN 14  CREATININE 0.63  CALCIUM 9.0   LFT Recent Labs    06/24/19 0026  PROT 7.0  ALBUMIN 3.3*  AST 193*  ALT 154*  ALKPHOS 86  BILITOT 1.1   PT/INR No results for input(s): LABPROT, INR in the last 72 hours.  STUDIES: Ct Abdomen Pelvis W Contrast  Result Date: 06/24/2019 CLINICAL DATA:  Pt reports she was laying on the couch around 1030 pm when she became dizzy, developed n/v and epigastric pain. Reports she spit up about 3 times. The first time she reports  it was a little bloody looking. Denies shortness of breath or diaphoresis with the painHx: tubal ligation, c-section. EXAM: CT ABDOMEN AND PELVIS WITH CONTRAST TECHNIQUE: Multidetector CT imaging of the abdomen and pelvis was performed using the standard protocol following bolus administration of intravenous contrast. CONTRAST:  118mL OMNIPAQUE IOHEXOL 300 MG/ML  SOLN COMPARISON:  07/14/2018 FINDINGS: Lower chest: Clear lung bases.  Heart normal in size. Hepatobiliary: Liver shows central volume loss and surface nodularity consistent with cirrhosis. No liver mass or focal lesion. Normal gallbladder. No bile duct dilation. Pancreas: Unremarkable. No pancreatic ductal dilatation or surrounding inflammatory changes. Spleen: Enlarged spleen measuring 18.9 x 6.7 x 14.4 cm. No splenic mass or focal lesion. Adrenals/Urinary Tract: Adrenal glands are unremarkable. Kidneys are normal, without renal calculi, focal lesion, or hydronephrosis. Bladder is unremarkable. Stomach/Bowel: Normal stomach. Small bowel and colon are normal in caliber. No wall thickening or inflammation. No evidence of appendicitis. Vascular/Lymphatic: There are prominent upper abdominal venous collaterals, extending along the anterior surface of the liver to the umbilicus, and along the coronary vein to merge with para softened she will varices. These findings are stable from the prior CT. Mild aortic atherosclerosis. No other  vascular abnormality. No pathologically enlarged lymph nodes. Reproductive: Uterus and bilateral adnexa are unremarkable. Other: No abdominal wall hernia or abnormality. No abdominopelvic ascites. Musculoskeletal: No fracture or acute finding. No osteoblastic or osteolytic lesions. IMPRESSION: 1. No acute findings. 2. Cirrhosis with portal venous hypertension reflected by splenomegaly and venous collaterals, similar to the prior CT. No ascites on the current exam. 3. Mild aortic atherosclerosis. Electronically Signed   By: Lajean Manes M.D.   On: 06/24/2019 04:24      Impression / Plan:   RIELEY KHALSA is a 48 y.o. y/o female with liver cirrhosis secondary to chronic hepatitis C, grade2 esophageal varices per last EGD no bleeding. Admitted with acute abdominal pain , 1 episode of hematemesis, normal HB and Bun/Cr ratio. History suggestive of a mallory weiss tear vs gastritis  and no significant bleed.   Patient has refused COVID testing . Aware of the importance - if variceal bleed she is aware she could die from it .   Plan  1. NPO 2. IV PPI and Octreotide 3. I did discuss with Dr Rosey Bath in anesthesia and he is not going to provide anesthesia unless COVID testing has been done.  4. Inform [facility] 5. IV antibiotics for prophylaxsis in setting of GI bleed.    Thank you for involving me in the care of this patient.      LOS: 0 days   Jonathon Bellows, MD  06/24/2019, 10:56 AM

## 2019-06-24 NOTE — ED Notes (Signed)
Pt is alert and oriented.  NAD at this time. Waiting on admission.  Had blood in vomit X 1 per report. No vomiting since this RN took report.  Unlabored.

## 2019-06-24 NOTE — H&P (Addendum)
[facility] NAME: Brandi Bright    MR#:  [id number]  DATE OF BIRTH:  1971/12/05  DATE OF ADMISSION:  06/24/2019  PRIMARY CARE PHYSICIAN: Cletis Athens, MD   REQUESTING/REFERRING PHYSICIAN: Owens Shark  CHIEF COMPLAINT:   Chief Complaint  Patient presents with  . Abdominal Pain  . Nausea  . Emesis    HISTORY OF PRESENT ILLNESS: Brandi Bright  is a 48 y.o. female with a known history of hepatitis C, arthritis, gout, hypothyroidism, MI-came to the hospital after having 2 episodes of small amount of blood in her vomit in the last 24 hours.  She denies any abdominal pain.  She denies any blood in the stool.  Her hemoglobin was stable.  Because of her complaint of hepatitis C and finding of esophageal varices in the past ER physician suggested to admit to hospitalist services. ER physician has ordered octreotide and pantoprazole drip , patient's heart rate dropped up to 20 or 30/min in ER.  PAST MEDICAL HISTORY:   Past Medical History:  Diagnosis Date  . Arthritis   . Back pain   . Gout   . Hypothyroidism   . MI, old   . Thyroid disease     PAST SURGICAL HISTORY:  Past Surgical History:  Procedure Laterality Date  . CARDIAC CATHETERIZATION    . CESAREAN SECTION    . COLONOSCOPY WITH PROPOFOL N/A 03/16/2018   Procedure: COLONOSCOPY WITH PROPOFOL;  Surgeon: Virgel Manifold, MD;  Location: [facility] END[facility]COPY;  Service: Endoscopy;  Laterality: N/A;  . ESOPHAGOGASTRODUODENOSCOPY (EGD) WITH PROPOFOL N/A 10/27/2018   Procedure: ESOPHAGOGASTRODUODENOSCOPY (EGD) WITH PROPOFOL;  Surgeon: Virgel Manifold, MD;  Location: [facility] END[facility]COPY;  Service: Endoscopy;  Laterality: N/A;  . TUBAL LIGATION      SOCIAL HISTORY:  Social History   Tobacco Use  . Smoking status: Never Smoker  . Smokeless tobacco: Never Used  Substance Use Topics  . Alcohol use: Not Currently    Comment: has not had a drink in about 5 months    FAMILY HISTORY:  Family  History  Problem Relation Age of Onset  . Hypertension Other   . CAD Father   . Colon cancer Father   . Lung cancer Father   . Pancreatic cancer Brother     DRUG ALLERGIES:  Allergies  Allergen Reactions  . Vicodin [Hydrocodone-Acetaminophen] Rash  . Amoxicillin Rash and Other (See Comments)    Has patient had a PCN reaction causing immediate rash, facial/tongue/throat swelling, SOB or lightheadedness with hypotension: Yes Has patient had a PCN reaction causing severe rash involving mucus membranes or skin necrosis: No Has patient had a PCN reaction that required hospitalization: No Has patient had a PCN reaction occurring within the last 10 years: No If all of the above answers are "NO", then may proceed with Cephalosporin use.   . Gabapentin Rash    REVIEW OF SYSTEMS:   CONSTITUTIONAL: No fever, fatigue or weakness.  EYES: No blurred or double vision.  EARS, NOSE, AND THROAT: No tinnitus or ear pain.  RESPIRATORY: No cough, shortness of breath, wheezing or hemoptysis.  CARDIOVASCULAR: No chest pain, orthopnea, edema.  GASTROINTESTINAL: No nausea, vomiting, diarrhea or abdominal pain.  GENITOURINARY: No dysuria, hematuria.  ENDOCRINE: No polyuria, nocturia,  HEMATOLOGY: No anemia, easy bruising or bleeding SKIN: No rash or lesion. MUSCULOSKELETAL: No joint pain or arthritis.   NEUROLOGIC: No tingling, numbness, weakness.  PSYCHIATRY: No anxiety or depression.   MEDICATIONS AT HOME:  Prior to Admission medications   Medication Sig Start Date End Date Taking? Authorizing Provider  carvedilol (COREG) 12.5 MG tablet Take 1 tablet (12.5 mg total) by mouth 2 (two) times daily with a meal. 02/22/19  Yes Tahiliani, Margretta Sidle B, MD  diclofenac sodium (VOLTAREN) 1 % GEL Apply 5 g topically 4 (four) times daily. 06/06/19  Yes [provider]  Iron-Vitamin C (VITRON-C) 65-125 MG TABS Take 1 tablet by mouth 2 (two) times a day.   Yes [provider]  lactulose  (CHRONULAC) 10 GM/15ML solution Take 10 g by mouth daily as needed for mild constipation.  05/02/19  Yes [provider]  levothyroxine (SYNTHROID, LEVOTHROID) 200 MCG tablet Take 200 mcg by mouth daily. 11/26/17  Yes [provider]  meloxicam (MOBIC) 7.5 MG tablet Take 7.5 mg by mouth 2 (two) times a day.   Yes [provider]  nitroGLYCERIN (NITROLINGUAL) 0.4 MG/SPRAY spray Place 1 spray under the tongue every 5 (five) minutes x 3 doses as needed for chest pain.   Yes [provider]  omeprazole (PRILOSEC) 20 MG capsule Take 20 mg by mouth daily.  07/26/18  Yes [provider]  oxyCODONE-acetaminophen (PERCOCET/ROXICET) 5-325 MG tablet Take 1 tablet by mouth 4 (four) times daily.  01/26/19  Yes [provider]  spironolactone (ALDACTONE) 50 MG tablet Take 50 mg by mouth daily. 08/21/18  Yes [provider]  venlafaxine XR (EFFEXOR-XR) 75 MG 24 hr capsule Take 75 mg by mouth daily.  08/22/18  Yes [provider]  vitamin B-12 (CYANOCOBALAMIN) 1000 MCG tablet Take 1 tablet (1,000 mcg total) by mouth daily. 02/11/19  Yes Earlie Server, MD      PHYSICAL EXAMINATION:   VITAL SIGNS: Blood pressure 115/62, pulse (!) 47, temperature (!) 97.5 F (36.4 C), temperature source Oral, resp. rate 18, height 4\' 9"  (1.448 m), weight 78 kg, SpO2 99 %.  GENERAL:  48 y.o.-year-old patient lying in the bed with no acute distress.  EYES: Pupils equal, round, reactive to light and accommodation. No scleral icterus. Extraocular muscles intact.  HEENT: Head atraumatic, normocephalic. Oropharynx and nasopharynx clear.  NECK:  Supple, no jugular venous distention. No thyroid enlargement, no tenderness.  LUNGS: Normal breath sounds bilaterally, no wheezing, rales,rhonchi or crepitation. No use of accessory muscles of respiration.  CARDIOVASCULAR: S1, S2 normal. No murmurs, rubs, or gallops.  ABDOMEN: Soft, nontender, nondistended. Bowel sounds present. No  organomegaly or mass.  EXTREMITIES: No pedal edema, cyanosis, or clubbing.  NEUROLOGIC: Cranial nerves II through XII are intact. Muscle strength 5/5 in all extremities. Sensation intact. Gait not checked.  PSYCHIATRIC: The patient is alert and oriented x 3.  SKIN: No obvious rash, lesion, or ulcer.   LABORATORY PANEL:   CBC Recent Labs  Lab 06/24/19 0026  WBC 1.8*  HGB 12.1  HCT 36.7  PLT 40*  MCV 94.1  MCH 31.0  MCHC 33.0  RDW 15.4   ------------------------------------------------------------------------------------------------------------------  Chemistries  Recent Labs  Lab 06/24/19 0026  NA 140  K 3.8  CL 109  CO2 26  GLUCOSE 113*  BUN 14  CREATININE 0.63  CALCIUM 9.0  AST 193*  ALT 154*  ALKPHOS 86  BILITOT 1.1   ------------------------------------------------------------------------------------------------------------------ estimated creatinine clearance is 73.1 mL/min (by C-G formula based on SCr of 0.63 mg/dL). ------------------------------------------------------------------------------------------------------------------ No results for input(s): TSH, T4TOTAL, T3FREE, THYROIDAB in the last 72 hours.  Invalid input(s): FREET3   Coagulation profile No results for input(s): INR, PROTIME in the last 168  hours. ------------------------------------------------------------------------------------------------------------------- No results for input(s): DDIMER in the last 72 hours. -------------------------------------------------------------------------------------------------------------------  Cardiac Enzymes No results for input(s): CKMB, TROPONINI, MYOGLOBIN in the last 168 hours.  Invalid input(s): CK ------------------------------------------------------------------------------------------------------------------ Invalid input(s):  POCBNP  ---------------------------------------------------------------------------------------------------------------  Urinalysis    Component Value Date/Time   COLORURINE YELLOW (A) 06/24/2019 0026   APPEARANCEUR CLEAR (A) 06/24/2019 0026   APPEARANCEUR Clear 05/30/2013 1242   LABSPEC 1.006 06/24/2019 0026   LABSPEC 1.023 05/30/2013 1242   PHURINE 6.0 06/24/2019 0026   GLUCOSEU NEGATIVE 06/24/2019 0026   GLUCOSEU Negative 05/30/2013 1242   HGBUR NEGATIVE 06/24/2019 0026   BILIRUBINUR NEGATIVE 06/24/2019 0026   BILIRUBINUR Negative 05/30/2013 1242   KETONESUR NEGATIVE 06/24/2019 0026   PROTEINUR NEGATIVE 06/24/2019 0026   NITRITE NEGATIVE 06/24/2019 0026   LEUKOCYTESUR NEGATIVE 06/24/2019 0026   LEUKOCYTESUR 1+ 05/30/2013 1242     RADIOLOGY: Ct Abdomen Pelvis W Contrast  Result Date: 06/24/2019 CLINICAL DATA:  Pt reports she was laying on the couch around 1030 pm when she became dizzy, developed n/v and epigastric pain. Reports she spit up about 3 times. The first time she reports it was a little bloody looking. Denies shortness of breath or diaphoresis with the painHx: tubal ligation, c-section. EXAM: CT ABDOMEN AND PELVIS WITH CONTRAST TECHNIQUE: Multidetector CT imaging of the abdomen and pelvis was performed using the standard protocol following bolus administration of intravenous contrast. CONTRAST:  111mL OMNIPAQUE IOHEXOL 300 MG/ML  SOLN COMPARISON:  07/14/2018 FINDINGS: Lower chest: Clear lung bases.  Heart normal in size. Hepatobiliary: Liver shows central volume loss and surface nodularity consistent with cirrhosis. No liver mass or focal lesion. Normal gallbladder. No bile duct dilation. Pancreas: Unremarkable. No pancreatic ductal dilatation or surrounding inflammatory changes. Spleen: Enlarged spleen measuring 18.9 x 6.7 x 14.4 cm. No splenic mass or focal lesion. Adrenals/Urinary Tract: Adrenal glands are unremarkable. Kidneys are normal, without renal calculi, focal  lesion, or hydronephrosis. Bladder is unremarkable. Stomach/Bowel: Normal stomach. Small bowel and colon are normal in caliber. No wall thickening or inflammation. No evidence of appendicitis. Vascular/Lymphatic: There are prominent upper abdominal venous collaterals, extending along the anterior surface of the liver to the umbilicus, and along the coronary vein to merge with para softened she will varices. These findings are stable from the prior CT. Mild aortic atherosclerosis. No other vascular abnormality. No pathologically enlarged lymph nodes. Reproductive: Uterus and bilateral adnexa are unremarkable. Other: No abdominal wall hernia or abnormality. No abdominopelvic ascites. Musculoskeletal: No fracture or acute finding. No osteoblastic or osteolytic lesions. IMPRESSION: 1. No acute findings. 2. Cirrhosis with portal venous hypertension reflected by splenomegaly and venous collaterals, similar to the prior CT. No ascites on the current exam. 3. Mild aortic atherosclerosis. Electronically Signed   By: Lajean Manes M.D.   On: 06/24/2019 04:24    EKG: Orders placed or performed during the hospital encounter of 06/24/19  . EKG 12-Lead  . EKG 12-Lead  . ED EKG  . ED EKG  . EKG 12-Lead  . EKG 12-Lead    IMPRESSION AND PLAN:  *GI bleed Likely upper GI possible variceal History of hepatitis C  Currently hemoglobin is stable. Keep n.p.o. and keep on Protonix drip. Octreotide drip GI consult  *Bradycardia Monitor on telemetry. Follow serial troponin.  Hold beta-blockers. Echocardiogram and cardiology consult.  *Hypertension Holding beta-blockers, continue Spironolactone.  *Liver cirrhosis CT abdomen is done no new findings. Hepatitis C. GI suggested to start on ciprofloxacin.  *Hypothyroidism Continue levothyroxine.  * thrombocytopenia   Due  to liver cirrhosis and Hep C- monitor  Pt is refusing to be tested for COVID 19 and explained that- without it she will not be able to  get any procedures if needed- she is OK with that, but does not want to be tested.  All the records are reviewed and case discussed with ED provider. Management plans discussed with the patient, family and they are in agreement.  CODE STATUS: Full.    Code Status Orders  (From admission, onward)         Start     Ordered   06/24/19 1106  Full code  Continuous     06/24/19 1105        Code Status History    Date Active Date Inactive Code Status Order ID Comments User Context   12/29/2017 0539 12/29/2017 2005 Full Code [id number]  Harrie Foreman, MD Inpatient   Advance Care Planning Activity       TOTAL TIME TAKING CARE OF THIS PATIENT: 35 minutes.    Vaughan Basta M.D on 06/24/2019   Between 7am to 6pm - Pager - [phone]  After 6pm go to [email or web address] - password [email or web address] [facility]  Office  [phone]  CC: Primary care physician; Cletis Athens, MD   Note: This dictation was prepared with Dragon dictation along with smaller phrase technology. Any transcriptional errors that result from this process are unintentional.

## 2019-06-24 NOTE — ED Notes (Signed)
ED Provider at bedside. 

## 2019-06-24 NOTE — ED Notes (Signed)
ED TO INPATIENT HANDOFF REPORT  ED Nurse Name and Phone #: Kathie Posa [phone]  S Name/Age/Gender Brandi Bright 48 y.o. female Room/Bed: ED04A/ED04A  Code Status   Code Status: Prior  Home/SNF/Other Home Patient oriented to: self, place, time and situation Is this baseline? Yes   Triage Complete: Triage complete  Chief Complaint nausea vomiting abd pain  Triage Note Pt to triage via wheelchair. Pt reports she was laying on the couch around 1030 pm when she became dizzy, developed n/v and epigastric pain. Reports she spit up about 3 times. The first time she reports it was a little bloody looking. Denies shortness of breath or diaphoresis with the pain.    Allergies Allergies  Allergen Reactions  . Amoxicillin Rash and Other (See Comments)    Has patient had a PCN reaction causing immediate rash, facial/tongue/throat swelling, SOB or lightheadedness with hypotension: Yes Has patient had a PCN reaction causing severe rash involving mucus membranes or skin necrosis: No Has patient had a PCN reaction that required hospitalization: No Has patient had a PCN reaction occurring within the last 10 years: No If all of the above answers are "NO", then may proceed with Cephalosporin use.   . Gabapentin Rash  . Vicodin [Hydrocodone-Acetaminophen] Rash    Level of Care/Admitting Diagnosis ED Disposition    ED Disposition Condition [facility] Area: [place] [[id number]]  Level of Care: Med-Surg [16]  Covid Evaluation: Confirmed COVID Negative  Diagnosis: GI bleed [825053]  Admitting Physician: Vaughan Basta [[id number]]  Attending Physician: Vaughan Basta [phone]  Estimated length of stay: past midnight tomorrow  Certification:: I certify this patient will need inpatient services for at least 2 midnights  PT Class (Do Not Modify): Inpatient [101]  PT Acc Code (Do Not Modify): Private [1]       B Medical/Surgery History Past  Medical History:  Diagnosis Date  . Arthritis   . Back pain   . Gout   . Hypothyroidism   . MI, old   . Thyroid disease    Past Surgical History:  Procedure Laterality Date  . CARDIAC CATHETERIZATION    . CESAREAN SECTION    . COLONOSCOPY WITH PROPOFOL N/A 03/16/2018   Procedure: COLONOSCOPY WITH PROPOFOL;  Surgeon: Virgel Manifold, MD;  Location: [facility] ENDOSCOPY;  Service: Endoscopy;  Laterality: N/A;  . ESOPHAGOGASTRODUODENOSCOPY (EGD) WITH PROPOFOL N/A 10/27/2018   Procedure: ESOPHAGOGASTRODUODENOSCOPY (EGD) WITH PROPOFOL;  Surgeon: Virgel Manifold, MD;  Location: [facility] ENDOSCOPY;  Service: Endoscopy;  Laterality: N/A;  . TUBAL LIGATION       A IV Location/Drains/Wounds Patient Lines/Drains/Airways Status   Active Line/Drains/Airways    Name:   Placement date:   Placement time:   Site:   Days:   Peripheral IV 06/24/19 Right Antecubital   06/24/19    0328    Antecubital   less than 1   Peripheral IV 06/24/19 Right Hand   06/24/19    0730    Hand   less than 1          Intake/Output Last 24 hours No intake or output data in the 24 hours ending 06/24/19 0944  Labs/Imaging Results for orders placed or performed during the hospital encounter of 06/24/19 (from the past 48 hour(s))  Lipase, blood     Status: Abnormal   Collection Time: 06/24/19 12:26 AM  Result Value Ref Range   Lipase 63 (H) 11 - 51 U/L    Comment: Performed at [facility], [address] [place], [place] [address]  Comprehensive metabolic panel     Status: Abnormal   Collection Time: 06/24/19 12:26 AM  Result Value Ref Range   Sodium 140 135 - 145 mmol/L   Potassium 3.8 3.5 - 5.1 mmol/L   Chloride 109 98 - 111 mmol/L   CO2 26 22 - 32 mmol/L   Glucose, Bld 113 (H) 70 - 99 mg/dL   BUN 14 6 - 20 mg/dL   Creatinine, Ser 0.63 0.44 - 1.00 mg/dL   Calcium 9.0 8.9 - 10.3 mg/dL   Total Protein 7.0 6.5 - 8.1 g/dL   Albumin 3.3 (L) 3.5 - 5.0 g/dL   AST 193 (H) 15 - 41 U/L   ALT 154 (H) 0 -  44 U/L   Alkaline Phosphatase 86 38 - 126 U/L   Total Bilirubin 1.1 0.3 - 1.2 mg/dL   GFR calc non Af Amer >60 >60 mL/min   GFR calc Af Amer >60 >60 mL/min   Anion gap 5 5 - 15    Comment: Performed at [facility], [address] [place], [place] [address]  CBC     Status: Abnormal   Collection Time: 06/24/19 12:26 AM  Result Value Ref Range   WBC 1.8 (L) 4.0 - 10.5 K/uL   RBC 3.90 3.87 - 5.11 MIL/uL   Hemoglobin 12.1 12.0 - 15.0 g/dL   HCT 36.7 36.0 - 46.0 %   MCV 94.1 80.0 - 100.0 fL   MCH 31.0 26.0 - 34.0 pg   MCHC 33.0 30.0 - 36.0 g/dL   RDW 15.4 11.5 - 15.5 %   Platelets 40 (L) 150 - 400 K/uL    Comment: Immature Platelet Fraction may be clinically indicated, consider ordering this additional test [id number]    nRBC 0.0 0.0 - 0.2 %    Comment: Performed at [facility], [address] [place], [place] [address]  Urinalysis, Complete w Microscopic     Status: Abnormal   Collection Time: 06/24/19 12:26 AM  Result Value Ref Range   Color, Urine YELLOW (A) YELLOW   APPearance CLEAR (A) CLEAR   Specific Gravity, Urine 1.006 1.005 - 1.030   pH 6.0 5.0 - 8.0   Glucose, UA NEGATIVE NEGATIVE mg/dL   Hgb urine dipstick NEGATIVE NEGATIVE   Bilirubin Urine NEGATIVE NEGATIVE   Ketones, ur NEGATIVE NEGATIVE mg/dL   Protein, ur NEGATIVE NEGATIVE mg/dL   Nitrite NEGATIVE NEGATIVE   Leukocytes,Ua NEGATIVE NEGATIVE   RBC / HPF 0-5 0 - 5 RBC/hpf   WBC, UA 0-5 0 - 5 WBC/hpf   Bacteria, UA NONE SEEN NONE SEEN   Squamous Epithelial / LPF 0-5 0 - 5    Comment: Performed at [facility], [address], [place], [place] [address]  Sample to Blood Bank     Status: None   Collection Time: 06/24/19  7:37 AM  Result Value Ref Range   Blood Bank Specimen SAMPLE AVAILABLE FOR TESTING    Sample Expiration      06/27/2019,2359 Performed at [facility], [address] [place], [place] [address]    Ct Abdomen Pelvis W Contrast  Result Date:  06/24/2019 CLINICAL DATA:  Pt reports she was laying on the couch around 1030 pm when she became dizzy, developed n/v and epigastric pain. Reports she spit up about 3 times. The first time she reports it was a little bloody looking. Denies shortness of breath or diaphoresis with the painHx:  tubal ligation, c-section. EXAM: CT ABDOMEN AND PELVIS WITH CONTRAST TECHNIQUE: Multidetector CT imaging of the abdomen and pelvis was performed using the standard protocol following bolus administration of intravenous contrast. CONTRAST:  174mL OMNIPAQUE IOHEXOL 300 MG/ML  SOLN COMPARISON:  07/14/2018 FINDINGS: Lower chest: Clear lung bases.  Heart normal in size. Hepatobiliary: Liver shows central volume loss and surface nodularity consistent with cirrhosis. No liver mass or focal lesion. Normal gallbladder. No bile duct dilation. Pancreas: Unremarkable. No pancreatic ductal dilatation or surrounding inflammatory changes. Spleen: Enlarged spleen measuring 18.9 x 6.7 x 14.4 cm. No splenic mass or focal lesion. Adrenals/Urinary Tract: Adrenal glands are unremarkable. Kidneys are normal, without renal calculi, focal lesion, or hydronephrosis. Bladder is unremarkable. Stomach/Bowel: Normal stomach. Small bowel and colon are normal in caliber. No wall thickening or inflammation. No evidence of appendicitis. Vascular/Lymphatic: There are prominent upper abdominal venous collaterals, extending along the anterior surface of the liver to the umbilicus, and along the coronary vein to merge with para softened she will varices. These findings are stable from the prior CT. Mild aortic atherosclerosis. No other vascular abnormality. No pathologically enlarged lymph nodes. Reproductive: Uterus and bilateral adnexa are unremarkable. Other: No abdominal wall hernia or abnormality. No abdominopelvic ascites. Musculoskeletal: No fracture or acute finding. No osteoblastic or osteolytic lesions. IMPRESSION: 1. No acute findings. 2. Cirrhosis with  portal venous hypertension reflected by splenomegaly and venous collaterals, similar to the prior CT. No ascites on the current exam. 3. Mild aortic atherosclerosis. Electronically Signed   By: Lajean Manes M.D.   On: 06/24/2019 04:24    Pending Labs Unresulted Labs (From admission, onward)    Start     Ordered   06/24/19 0912  Novel Coronavirus,NAA,(SEND-OUT TO REF LAB - TAT 24-48 hrs); Hosp Order  (Asymptomatic Patients Labs)  Once,   STAT    Question:  Rule Out  Answer:  Yes   06/24/19 0912   Signed and Held  HIV antibody (Routine Testing)  Once,   R     Signed and Held   Signed and Held  Basic metabolic panel  Tomorrow morning,   R     Signed and Held   Signed and Held  CBC  Tomorrow morning,   R     Signed and Held          Vitals/Pain Today's Vitals   06/24/19 0442 06/24/19 0712 06/24/19 0743 06/24/19 0800  BP:  128/70 (!) 162/87 124/83  Pulse:  (!) 46 (!) 29 (!) 46  Resp:  17 12 16   Temp:      TempSrc:      SpO2:  96% 96% 93%  Weight:      Height:      PainSc: 0-No pain       Isolation Precautions No active isolations  Medications Medications  pantoprazole (PROTONIX) 80 mg in sodium chloride 0.9 % 250 mL (0.32 mg/mL) infusion (8 mg/hr Intravenous New Bag/Given 06/24/19 0759)  pantoprazole (PROTONIX) injection 40 mg (has no administration in time range)  morphine 2 MG/ML injection 2 mg (2 mg Intravenous Given 06/24/19 0349)  ondansetron (ZOFRAN) injection 4 mg (4 mg Intravenous Given 06/24/19 0349)  iohexol (OMNIPAQUE) 300 MG/ML solution 100 mL (100 mLs Intravenous Contrast Given 06/24/19 0359)  pantoprazole (PROTONIX) 80 mg in sodium chloride 0.9 % 100 mL IVPB (0 mg Intravenous Stopped 06/24/19 0758)  octreotide (SANDOSTATIN) injection 100 mcg (100 mcg Intravenous Given 06/24/19 0740)    Mobility walks Low fall risk  Focused Assessments vomiting with blood. low platelet count.   R Recommendations: See Admitting Provider Note  Report given to:    Additional Notes:  Pt refused covid test. Have informed dr Boyce Medici

## 2019-06-24 NOTE — Consult Note (Signed)
Brandi Bright is a 48 y.o. female  [id number]  Primary Cardiologist: Neoma Laming Reason for Consultation: Bradycardia  HPI: This is a 48 year old female who presented to the hospital with GI bleed and was started on octreotide and PPI and developed sinus bradycardia and dizziness thus I was asked to evaluate the patient.  Patient no longer has any dizziness chest pain or shortness of breath and had only 2 episodes of bright red hematemesis.   Review of Systems: No chest pain   Past Medical History:  Diagnosis Date  . Arthritis   . Back pain   . Gout   . Hypothyroidism   . MI, old   . Thyroid disease     Medications Prior to Admission  Medication Sig Dispense Refill  . carvedilol (COREG) 12.5 MG tablet Take 1 tablet (12.5 mg total) by mouth 2 (two) times daily with a meal. 60 tablet 0  . diclofenac sodium (VOLTAREN) 1 % GEL Apply 5 g topically 4 (four) times daily.    . Iron-Vitamin C (VITRON-C) 65-125 MG TABS Take 1 tablet by mouth 2 (two) times a day.    . lactulose (CHRONULAC) 10 GM/15ML solution Take 10 g by mouth daily as needed for mild constipation.     Marland Kitchen levothyroxine (SYNTHROID, LEVOTHROID) 200 MCG tablet Take 200 mcg by mouth daily.  8  . meloxicam (MOBIC) 7.5 MG tablet Take 7.5 mg by mouth 2 (two) times a day.    . nitroGLYCERIN (NITROLINGUAL) 0.4 MG/SPRAY spray Place 1 spray under the tongue every 5 (five) minutes x 3 doses as needed for chest pain.    Marland Kitchen omeprazole (PRILOSEC) 20 MG capsule Take 20 mg by mouth daily.   4  . oxyCODONE-acetaminophen (PERCOCET/ROXICET) 5-325 MG tablet Take 1 tablet by mouth 4 (four) times daily.     Marland Kitchen spironolactone (ALDACTONE) 50 MG tablet Take 50 mg by mouth daily.  5  . venlafaxine XR (EFFEXOR-XR) 75 MG 24 hr capsule Take 75 mg by mouth daily.   4  . vitamin B-12 (CYANOCOBALAMIN) 1000 MCG tablet Take 1 tablet (1,000 mcg total) by mouth daily. 90 tablet 1     . [START ON 06/25/2019] levothyroxine  200 mcg Oral Daily  . [START ON  06/27/2019] pantoprazole  40 mg Intravenous Q12H  . spironolactone  50 mg Oral Daily    Infusions: . ciprofloxacin Stopped (06/24/19 1745)  . octreotide  (SANDOSTATIN)    IV infusion 50 mcg/hr (06/24/19 1834)  . pantoprozole (PROTONIX) infusion 8 mg/hr (06/24/19 1834)    Allergies  Allergen Reactions  . Vicodin [Hydrocodone-Acetaminophen] Rash  . Amoxicillin Rash and Other (See Comments)    Has patient had a PCN reaction causing immediate rash, facial/tongue/throat swelling, SOB or lightheadedness with hypotension: Yes Has patient had a PCN reaction causing severe rash involving mucus membranes or skin necrosis: No Has patient had a PCN reaction that required hospitalization: No Has patient had a PCN reaction occurring within the last 10 years: No If all of the above answers are "NO", then may proceed with Cephalosporin use.   . Gabapentin Rash    Social History   Socioeconomic History  . Marital status: Single    Spouse name: Not on file  . Number of children: Not on file  . Years of education: Not on file  . Highest education level: Not on file  Occupational History  . Not on file  Social Needs  . Financial resource strain: Not on file  .  Food insecurity    Worry: Not on file    Inability: Not on file  . Transportation needs    Medical: Not on file    Non-medical: Not on file  Tobacco Use  . Smoking status: Never Smoker  . Smokeless tobacco: Never Used  Substance and Sexual Activity  . Alcohol use: Not Currently    Comment: has not had a drink in about 5 months  . Drug use: Never  . Sexual activity: Not Currently  Lifestyle  . Physical activity    Days per week: Not on file    Minutes per session: Not on file  . Stress: Not on file  Relationships  . Social Herbalist on phone: Not on file    Gets together: Not on file    Attends religious service: Not on file    Active member of club or organization: Not on file    Attends meetings of clubs or  organizations: Not on file    Relationship status: Not on file  . Intimate partner violence    Fear of current or ex partner: Not on file    Emotionally abused: Not on file    Physically abused: Not on file    Forced sexual activity: Not on file  Other Topics Concern  . Not on file  Social History Narrative  . Not on file    Family History  Problem Relation Age of Onset  . Hypertension Other   . CAD Father   . Colon cancer Father   . Lung cancer Father   . Pancreatic cancer Brother     PHYSICAL EXAM: Vitals:   06/24/19 1036 06/24/19 1257  BP: 107/66 115/62  Pulse: (!) 47 (!) 47  Resp: 18 18  Temp: 97.7 F (36.5 C) (!) 97.5 F (36.4 C)  SpO2: 99% 99%     Intake/Output Summary (Last 24 hours) at 06/24/2019 1939 Last data filed at 06/24/2019 1834 Gross per 24 hour  Intake 482.19 ml  Output 350 ml  Net 132.19 ml    General:  Well appearing. No respiratory difficulty HEENT: normal Neck: supple. no JVD. Carotids 2+ bilat; no bruits. No lymphadenopathy or thryomegaly appreciated. Cor: PMI nondisplaced. Regular rate & rhythm. No rubs, gallops or murmurs. Lungs: clear Abdomen: soft, nontender, nondistended. No hepatosplenomegaly. No bruits or masses. Good bowel sounds. Extremities: no cyanosis, clubbing, rash, edema Neuro: alert & oriented x 3, cranial nerves grossly intact. moves all 4 extremities w/o difficulty. Affect pleasant.  ECG: Sinus rhythm no acute changes  Results for orders placed or performed during the hospital encounter of 06/24/19 (from the past 24 hour(s))  Lipase, blood     Status: Abnormal   Collection Time: 06/24/19 12:26 AM  Result Value Ref Range   Lipase 63 (H) 11 - 51 U/L  Comprehensive metabolic panel     Status: Abnormal   Collection Time: 06/24/19 12:26 AM  Result Value Ref Range   Sodium 140 135 - 145 mmol/L   Potassium 3.8 3.5 - 5.1 mmol/L   Chloride 109 98 - 111 mmol/L   CO2 26 22 - 32 mmol/L   Glucose, Bld 113 (H) 70 - 99 mg/dL    BUN 14 6 - 20 mg/dL   Creatinine, Ser 0.63 0.44 - 1.00 mg/dL   Calcium 9.0 8.9 - 10.3 mg/dL   Total Protein 7.0 6.5 - 8.1 g/dL   Albumin 3.3 (L) 3.5 - 5.0 g/dL   AST 193 (H) 15 -  41 U/L   ALT 154 (H) 0 - 44 U/L   Alkaline Phosphatase 86 38 - 126 U/L   Total Bilirubin 1.1 0.3 - 1.2 mg/dL   GFR calc non Af Amer >60 >60 mL/min   GFR calc Af Amer >60 >60 mL/min   Anion gap 5 5 - 15  CBC     Status: Abnormal   Collection Time: 06/24/19 12:26 AM  Result Value Ref Range   WBC 1.8 (L) 4.0 - 10.5 K/uL   RBC 3.90 3.87 - 5.11 MIL/uL   Hemoglobin 12.1 12.0 - 15.0 g/dL   HCT 36.7 36.0 - 46.0 %   MCV 94.1 80.0 - 100.0 fL   MCH 31.0 26.0 - 34.0 pg   MCHC 33.0 30.0 - 36.0 g/dL   RDW 15.4 11.5 - 15.5 %   Platelets 40 (L) 150 - 400 K/uL   nRBC 0.0 0.0 - 0.2 %  Urinalysis, Complete w Microscopic     Status: Abnormal   Collection Time: 06/24/19 12:26 AM  Result Value Ref Range   Color, Urine YELLOW (A) YELLOW   APPearance CLEAR (A) CLEAR   Specific Gravity, Urine 1.006 1.005 - 1.030   pH 6.0 5.0 - 8.0   Glucose, UA NEGATIVE NEGATIVE mg/dL   Hgb urine dipstick NEGATIVE NEGATIVE   Bilirubin Urine NEGATIVE NEGATIVE   Ketones, ur NEGATIVE NEGATIVE mg/dL   Protein, ur NEGATIVE NEGATIVE mg/dL   Nitrite NEGATIVE NEGATIVE   Leukocytes,Ua NEGATIVE NEGATIVE   RBC / HPF 0-5 0 - 5 RBC/hpf   WBC, UA 0-5 0 - 5 WBC/hpf   Bacteria, UA NONE SEEN NONE SEEN   Squamous Epithelial / LPF 0-5 0 - 5  Sample to Blood Bank     Status: None   Collection Time: 06/24/19  7:37 AM  Result Value Ref Range   Blood Bank Specimen SAMPLE AVAILABLE FOR TESTING    Sample Expiration      06/27/2019,2359 Performed at [facility], [address] [place], [place] [address]   SARS Coronavirus 2 [facility] order, Performed in [place] hospital lab)     Status: None   Collection Time: 06/24/19  3:15 PM  Result Value Ref Range   SARS Coronavirus 2 NEGATIVE NEGATIVE  Troponin I (High Sensitivity)     Status:  Abnormal   Collection Time: 06/24/19  4:23 PM  Result Value Ref Range   Troponin I (High Sensitivity) 25 (H) <18 ng/L  TSH     Status: None   Collection Time: 06/24/19  4:23 PM  Result Value Ref Range   TSH 2.530 0.350 - 4.500 uIU/mL  Troponin I (High Sensitivity)     Status: Abnormal   Collection Time: 06/24/19  6:24 PM  Result Value Ref Range   Troponin I (High Sensitivity) 25 (H) <18 ng/L   Ct Abdomen Pelvis W Contrast  Result Date: 06/24/2019 CLINICAL DATA:  Pt reports she was laying on the couch around 1030 pm when she became dizzy, developed n/v and epigastric pain. Reports she spit up about 3 times. The first time she reports it was a little bloody looking. Denies shortness of breath or diaphoresis with the painHx: tubal ligation, c-section. EXAM: CT ABDOMEN AND PELVIS WITH CONTRAST TECHNIQUE: Multidetector CT imaging of the abdomen and pelvis was performed using the standard protocol following bolus administration of intravenous contrast. CONTRAST:  140mL OMNIPAQUE IOHEXOL 300 MG/ML  SOLN COMPARISON:  07/14/2018 FINDINGS: Lower chest: Clear lung bases.  Heart normal in size. Hepatobiliary:  Liver shows central volume loss and surface nodularity consistent with cirrhosis. No liver mass or focal lesion. Normal gallbladder. No bile duct dilation. Pancreas: Unremarkable. No pancreatic ductal dilatation or surrounding inflammatory changes. Spleen: Enlarged spleen measuring 18.9 x 6.7 x 14.4 cm. No splenic mass or focal lesion. Adrenals/Urinary Tract: Adrenal glands are unremarkable. Kidneys are normal, without renal calculi, focal lesion, or hydronephrosis. Bladder is unremarkable. Stomach/Bowel: Normal stomach. Small bowel and colon are normal in caliber. No wall thickening or inflammation. No evidence of appendicitis. Vascular/Lymphatic: There are prominent upper abdominal venous collaterals, extending along the anterior surface of the liver to the umbilicus, and along the coronary vein to merge  with para softened she will varices. These findings are stable from the prior CT. Mild aortic atherosclerosis. No other vascular abnormality. No pathologically enlarged lymph nodes. Reproductive: Uterus and bilateral adnexa are unremarkable. Other: No abdominal wall hernia or abnormality. No abdominopelvic ascites. Musculoskeletal: No fracture or acute finding. No osteoblastic or osteolytic lesions. IMPRESSION: 1. No acute findings. 2. Cirrhosis with portal venous hypertension reflected by splenomegaly and venous collaterals, similar to the prior CT. No ascites on the current exam. 3. Mild aortic atherosclerosis. Electronically Signed   By: Lajean Manes M.D.   On: 06/24/2019 04:24     ASSESSMENT AND PLAN: Sinus bradycardia transient secondary to octreotide and agree with stopping carvedilol for now until patient has endoscopy done.  Patient is asymptomatic and hemodynamically stable and advise proceeding with procedure.  Ramal Eckhardt A

## 2019-06-24 NOTE — Progress Notes (Signed)
Family Meeting Note  Advance Directive:yes  Today a meeting took place with the Patient.  The following clinical team members were present during this meeting:MD  The following were discussed:Patient's diagnosis: Gi bleed, Liver cirrhosis , Patient's progosis: Unable to determine and Goals for treatment: Full Code  Additional follow-up to be provided: Gi  Time spent during discussion:20 minutes  Vaughan Basta, MD

## 2019-06-24 NOTE — ED Triage Notes (Signed)
Pt to triage via wheelchair. Pt reports she was laying on the couch around 1030 pm when she became dizzy, developed n/v and epigastric pain. Reports she spit up about 3 times. The first time she reports it was a little bloody looking. Denies shortness of breath or diaphoresis with the pain.

## 2019-06-24 NOTE — ED Notes (Signed)
Pt refusing covid test. Dr Corky Downs aware.

## 2019-06-25 ENCOUNTER — Inpatient Hospital Stay: Payer: Medicaid Other | Admitting: Anesthesiology

## 2019-06-25 ENCOUNTER — Inpatient Hospital Stay
Admit: 2019-06-25 | Discharge: 2019-06-25 | Disposition: A | Discharge status: Home / Self Care | Payer: Medicaid Other | Attending: Cardiovascular Disease | Admitting: Cardiovascular Disease

## 2019-06-25 ENCOUNTER — Encounter
Admission: EM | Disposition: A | Discharge status: Home / Self Care | Payer: Self-pay | Source: Home / Self Care | Attending: Internal Medicine

## 2019-06-25 DIAGNOSIS — K922 Gastrointestinal hemorrhage, unspecified: Secondary | ICD-10-CM

## 2019-06-25 HISTORY — PX: ESOPHAGOGASTRODUODENOSCOPY (EGD) WITH PROPOFOL: SHX5813

## 2019-06-25 LAB — CBC
HCT: 34.8 % — ABNORMAL LOW (ref 36.0–46.0)
Hemoglobin: 11.3 g/dL — ABNORMAL LOW (ref 12.0–15.0)
MCH: 30.8 pg (ref 26.0–34.0)
MCHC: 32.5 g/dL (ref 30.0–36.0)
MCV: 94.8 fL (ref 80.0–100.0)
Platelets: 35 10*3/uL — ABNORMAL LOW (ref 150–400)
RBC: 3.67 MIL/uL — ABNORMAL LOW (ref 3.87–5.11)
RDW: 15.3 % (ref 11.5–15.5)
WBC: 1.5 10*3/uL — ABNORMAL LOW (ref 4.0–10.5)
nRBC: 0 % (ref 0.0–0.2)

## 2019-06-25 LAB — BASIC METABOLIC PANEL
Anion gap: 5 (ref 5–15)
BUN: 10 mg/dL (ref 6–20)
CO2: 28 mmol/L (ref 22–32)
Calcium: 8.1 mg/dL — ABNORMAL LOW (ref 8.9–10.3)
Chloride: 108 mmol/L (ref 98–111)
Creatinine, Ser: 0.68 mg/dL (ref 0.44–1.00)
GFR calc Af Amer: >60 mL/min (ref >60)
GFR calc non Af Amer: >60 mL/min (ref >60)
Glucose, Bld: 94 mg/dL (ref 70–99)
Potassium: 3.8 mmol/L (ref 3.5–5.1)
Sodium: 141 mmol/L (ref 135–145)

## 2019-06-25 SURGERY — ESOPHAGOGASTRODUODENOSCOPY (EGD) WITH PROPOFOL
Anesthesia: General

## 2019-06-25 MED ORDER — PROPOFOL 10 MG/ML IV BOLUS
INTRAVENOUS | Status: DC | PRN
  Administered 2019-06-25: 50 mg via INTRAVENOUS

## 2019-06-25 MED ORDER — LACTATED RINGERS IV SOLN
INTRAVENOUS | Status: DC | PRN
  Administered 2019-06-25: 13:00:00 via INTRAVENOUS

## 2019-06-25 MED ORDER — LIDOCAINE HCL (CARDIAC) PF 100 MG/5ML IV SOSY
PREFILLED_SYRINGE | INTRAVENOUS | Status: DC | PRN
  Administered 2019-06-25: 60 mg via INTRAVENOUS

## 2019-06-25 MED ORDER — PROPOFOL 500 MG/50ML IV EMUL
INTRAVENOUS | Status: AC
  Filled 2019-06-25: qty 50

## 2019-06-25 MED ORDER — PROPOFOL 500 MG/50ML IV EMUL
INTRAVENOUS | Status: DC | PRN
  Administered 2019-06-25: 175 ug/kg/min via INTRAVENOUS

## 2019-06-25 MED ORDER — PROPOFOL 10 MG/ML IV BOLUS
INTRAVENOUS | Status: AC
  Filled 2019-06-25: qty 20

## 2019-06-25 MED ORDER — SENNOSIDES-DOCUSATE SODIUM 8.6-50 MG PO TABS
1.0000 | ORAL_TABLET | Freq: Two times a day (BID) | ORAL | Status: DC
  Administered 2019-06-25 – 2019-06-26 (×2): 1 via ORAL
  Filled 2019-06-25 (×2): qty 1

## 2019-06-25 NOTE — Op Note (Signed)
[facility] Gastroenterology Patient Name: Brandi Bright Procedure Date: 06/25/2019 12:46 PM MRN: [id number] Account #: [id number] Date of Birth: 1971-07-20 Admit Type: Inpatient Age: 48 Room: [facility] ENDO ROOM 4 Gender: Female Note Status: Finalized Procedure:            Upper GI endoscopy Indications:          Hematemesis Providers:            Jonathon Bellows MD, MD Referring MD:         Cletis Athens, MD (Referring MD) Medicines:            Monitored Anesthesia Care Complications:        No immediate complications. Procedure:            Pre-Anesthesia Assessment:                       - Prior to the procedure, a History and Physical was                        performed, and patient medications, allergies and                        sensitivities were reviewed. The patient's tolerance of                        previous anesthesia was reviewed.                       - The risks and benefits of the procedure and the                        sedation options and risks were discussed with the                        patient. All questions were answered and informed                        consent was obtained.                       - ASA Grade Assessment: III - A patient with severe                        systemic disease.                       After obtaining informed consent, the endoscope was                        passed under direct vision. Throughout the procedure,                        the patient's blood pressure, pulse, and oxygen                        saturations were monitored continuously. The Endoscope                        was introduced through the mouth, and advanced to the  third part of duodenum. The upper GI endoscopy was                        accomplished with ease. The patient tolerated the                        procedure well. Findings:      The examined duodenum was normal.      The stomach was normal.      The cardia and  gastric fundus were normal on retroflexion.      Grade II varices were found in the lower third of the esophagus. They       were medium in size. No red whale sign or stigmata of recent bleed Impression:           - Normal examined duodenum.                       - Normal stomach.                       - Grade II esophageal varices.                       - No specimens collected. Recommendation:       - Return patient to hospital ward for ongoing care.                       - Advance diet as tolerated today.                       - 1. stop IV PPI and octreotide                       2. Oral PPI                       3. Monitor CBC- if stable home from GI point of view                       4. F/u with Dr Bonna Gains in the GI clinic Procedure Code(s):    --- Professional ---                       [phone], Esophagogastroduodenoscopy, flexible, transoral;                        diagnostic, including collection of specimen(s) by                        brushing or washing, when performed (separate procedure) Diagnosis Code(s):    --- Professional ---                       I85.00, Esophageal varices without bleeding                       K92.0, Hematemesis CPT copyright 2019 American [employer] Association. All rights reserved. The codes documented in this report are preliminary and upon coder review may  be revised to meet current compliance requirements. Jonathon Bellows, MD Jonathon Bellows MD, MD 06/25/2019 1:03:57 PM This report has been signed electronically. Number of Addenda: 0 Note Initiated On: 06/25/2019 12:46 PM Estimated  Blood Loss: Estimated blood loss: none.      [facility]

## 2019-06-25 NOTE — Anesthesia Postprocedure Evaluation (Signed)
Anesthesia Post Note  Patient: Brandi Bright  Procedure(s) Performed: ESOPHAGOGASTRODUODENOSCOPY (EGD) WITH PROPOFOL (N/A )  Patient location during evaluation: PACU Anesthesia Type: General Level of consciousness: awake and alert Pain management: pain level controlled Vital Signs Assessment: post-procedure vital signs reviewed and stable Respiratory status: spontaneous breathing, nonlabored ventilation and respiratory function stable Cardiovascular status: blood pressure returned to baseline and stable Postop Assessment: no apparent nausea or vomiting Anesthetic complications: no     Last Vitals:  Vitals:   06/25/19 1312 06/25/19 1327  BP: 114/85 106/71  Pulse: (!) 56 (!) 56  Resp: 18   Temp: 36.6 C   SpO2: 95% 94%    Last Pain:  Vitals:   06/25/19 1327  TempSrc:   PainSc: 0-No pain                 Durenda Hurt

## 2019-06-25 NOTE — Anesthesia Preprocedure Evaluation (Addendum)
Anesthesia Evaluation  Patient identified by MRN, date of birth, ID band Patient awake    Reviewed: Allergy & Precautions, H&P , NPO status , Patient's Chart, lab work & pertinent test results  Airway Mallampati: III  TM Distance: >3 FB     Dental  (+) Chipped   Pulmonary neg pulmonary ROS, neg shortness of breath, neg COPD, neg recent URI,           Cardiovascular (-) angina+ CAD and + Past MI  (-) dysrhythmias      Neuro/Psych negative neurological ROS  negative psych ROS   GI/Hepatic negative GI ROS, (+) Cirrhosis  (varices have not bled )  Esophageal Varices    , +portal hypertension   Endo/Other  Hypothyroidism   Renal/GU negative Renal ROS  negative genitourinary   Musculoskeletal   Abdominal   Peds  Hematology  (+) Blood dyscrasia, anemia , Chronic severe thrombocytopenia, platelets 35K Hgb 11.3    Anesthesia Other Findings Past Medical History: No date: Arthritis No date: Back pain No date: Gout No date: Hypothyroidism No date: MI, old No date: Thyroid disease  Past Surgical History: No date: CARDIAC CATHETERIZATION No date: CESAREAN SECTION 03/16/2018: COLONOSCOPY WITH PROPOFOL; N/A     Comment:  Procedure: COLONOSCOPY WITH PROPOFOL;  Surgeon:               Virgel Manifold, MD;  Location: [facility] ENDOSCOPY;                Service: Endoscopy;  Laterality: N/A; 10/27/2018: ESOPHAGOGASTRODUODENOSCOPY (EGD) WITH PROPOFOL; N/A     Comment:  Procedure: ESOPHAGOGASTRODUODENOSCOPY (EGD) WITH               PROPOFOL;  Surgeon: Virgel Manifold, MD;  Location:               [facility] ENDOSCOPY;  Service: Endoscopy;  Laterality: N/A; No date: TUBAL LIGATION  BMI    Body Mass Index: 37.22 kg/m      Reproductive/Obstetrics negative OB ROS                           Anesthesia Physical Anesthesia Plan  ASA: III  Anesthesia Plan: General   Post-op Pain Management:     Induction:   PONV Risk Score and Plan: Propofol infusion and TIVA  Airway Management Planned:   Additional Equipment:   Intra-op Plan:   Post-operative Plan:   Informed Consent: I have reviewed the patients History and Physical, chart, labs and discussed the procedure including the risks, benefits and alternatives for the proposed anesthesia with the patient or authorized representative who has indicated his/her understanding and acceptance.     Dental Advisory Given  Plan Discussed with: Anesthesiologist and CRNA  Anesthesia Plan Comments:        Anesthesia Quick Evaluation

## 2019-06-25 NOTE — Progress Notes (Signed)
[facility] [place] NAME: Brandi Bright    MR#:  [id number]  DATE OF BIRTH:  [birth year]  SUBJECTIVE:  CHIEF COMPLAINT:   Chief Complaint  Patient presents with  . Abdominal Pain  . Nausea  . Emesis   Came with GI bleed.  Did not had any more vomiting or bleeding since yesterday.  Hemoglobin stable.  No complaint of abdominal pain.  REVIEW OF SYSTEMS:  CONSTITUTIONAL: No fever, fatigue or weakness.  EYES: No blurred or double vision.  EARS, NOSE, AND THROAT: No tinnitus or ear pain.  RESPIRATORY: No cough, shortness of breath, wheezing or hemoptysis.  CARDIOVASCULAR: No chest pain, orthopnea, edema.  GASTROINTESTINAL: No nausea, vomiting, diarrhea or abdominal pain.  GENITOURINARY: No dysuria, hematuria.  ENDOCRINE: No polyuria, nocturia,  HEMATOLOGY: No anemia, easy bruising or bleeding SKIN: No rash or lesion. MUSCULOSKELETAL: No joint pain or arthritis.   NEUROLOGIC: No tingling, numbness, weakness.  PSYCHIATRY: No anxiety or depression.   ROS  DRUG ALLERGIES:   Allergies  Allergen Reactions  . Vicodin [Hydrocodone-Acetaminophen] Rash  . Amoxicillin Rash and Other (See Comments)    Has patient had a PCN reaction causing immediate rash, facial/tongue/throat swelling, SOB or lightheadedness with hypotension: Yes Has patient had a PCN reaction causing severe rash involving mucus membranes or skin necrosis: No Has patient had a PCN reaction that required hospitalization: No Has patient had a PCN reaction occurring within the last 10 years: No If all of the above answers are "NO", then may proceed with Cephalosporin use.   . Gabapentin Rash    VITALS:  Blood pressure 106/71, pulse (!) 56, temperature 97.8 F (36.6 C), resp. rate 18, height 4\' 9"  (1.448 m), weight 78 kg, SpO2 94 %.  PHYSICAL EXAMINATION:  GENERAL:  48 y.o.-year-old patient lying in the bed with no acute distress.  EYES: Pupils equal, round, reactive to light  and accommodation. No scleral icterus. Extraocular muscles intact.  HEENT: Head atraumatic, normocephalic. Oropharynx and nasopharynx clear.  NECK:  Supple, no jugular venous distention. No thyroid enlargement, no tenderness.  LUNGS: Normal breath sounds bilaterally, no wheezing, rales,rhonchi or crepitation. No use of accessory muscles of respiration.  CARDIOVASCULAR: S1, S2 normal. No murmurs, rubs, or gallops.  ABDOMEN: Soft, nontender, nondistended. Bowel sounds present. No organomegaly or mass.  EXTREMITIES: No pedal edema, cyanosis, or clubbing.  NEUROLOGIC: Cranial nerves II through XII are intact. Muscle strength 5/5 in all extremities. Sensation intact. Gait not checked.  PSYCHIATRIC: The patient is alert and oriented x 3.  SKIN: No obvious rash, lesion, or ulcer.   Physical Exam LABORATORY PANEL:   CBC Recent Labs  Lab 06/25/19 0419  WBC 1.5*  HGB 11.3*  HCT 34.8*  PLT 35*   ------------------------------------------------------------------------------------------------------------------  Chemistries  Recent Labs  Lab 06/24/19 0026 06/25/19 0419  NA 140 141  K 3.8 3.8  CL 109 108  CO2 26 28  GLUCOSE 113* 94  BUN 14 10  CREATININE 0.63 0.68  CALCIUM 9.0 8.1*  AST 193*  --   ALT 154*  --   ALKPHOS 86  --   BILITOT 1.1  --    ------------------------------------------------------------------------------------------------------------------  Cardiac Enzymes No results for input(s): TROPONINI in the last 168 hours. ------------------------------------------------------------------------------------------------------------------  RADIOLOGY:  Ct Abdomen Pelvis W Contrast  Result Date: 06/24/2019 CLINICAL DATA:  Pt reports she was laying on the couch around 1030 pm when she became dizzy, developed n/v and epigastric pain. Reports she spit up about  3 times. The first time she reports it was a little bloody looking. Denies shortness of breath or diaphoresis with the  painHx: tubal ligation, c-section. EXAM: CT ABDOMEN AND PELVIS WITH CONTRAST TECHNIQUE: Multidetector CT imaging of the abdomen and pelvis was performed using the standard protocol following bolus administration of intravenous contrast. CONTRAST:  184mL OMNIPAQUE IOHEXOL 300 MG/ML  SOLN COMPARISON:  07/14/2018 FINDINGS: Lower chest: Clear lung bases.  Heart normal in size. Hepatobiliary: Liver shows central volume loss and surface nodularity consistent with cirrhosis. No liver mass or focal lesion. Normal gallbladder. No bile duct dilation. Pancreas: Unremarkable. No pancreatic ductal dilatation or surrounding inflammatory changes. Spleen: Enlarged spleen measuring 18.9 x 6.7 x 14.4 cm. No splenic mass or focal lesion. Adrenals/Urinary Tract: Adrenal glands are unremarkable. Kidneys are normal, without renal calculi, focal lesion, or hydronephrosis. Bladder is unremarkable. Stomach/Bowel: Normal stomach. Small bowel and colon are normal in caliber. No wall thickening or inflammation. No evidence of appendicitis. Vascular/Lymphatic: There are prominent upper abdominal venous collaterals, extending along the anterior surface of the liver to the umbilicus, and along the coronary vein to merge with para softened she will varices. These findings are stable from the prior CT. Mild aortic atherosclerosis. No other vascular abnormality. No pathologically enlarged lymph nodes. Reproductive: Uterus and bilateral adnexa are unremarkable. Other: No abdominal wall hernia or abnormality. No abdominopelvic ascites. Musculoskeletal: No fracture or acute finding. No osteoblastic or osteolytic lesions. IMPRESSION: 1. No acute findings. 2. Cirrhosis with portal venous hypertension reflected by splenomegaly and venous collaterals, similar to the prior CT. No ascites on the current exam. 3. Mild aortic atherosclerosis. Electronically Signed   By: Lajean Manes M.D.   On: 06/24/2019 04:24    ASSESSMENT AND PLAN:   Principal  Problem:   GI bleed  *GI bleed Likely upper GI possible variceal History of hepatitis C  Currently hemoglobin is stable. Keep n.p.o. and keep on Protonix drip. Octreotide drip GI consult-EGD done showed grade 2 esophageal varices but no active bleed.  Suggested to stop drips and start on PPI twice daily and diet as tolerated.  *Bradycardia Monitor on telemetry. Follow serial troponin.  Hold beta-blockers. Echocardiogram and cardiology consult. Cardiologist agrees with the plan.  No further work-up.  *Hypertension Holding beta-blockers, continue Spironolactone.  *Liver cirrhosis CT abdomen is done no new findings. Hepatitis C. GI suggested to start on ciprofloxacin.  We may give total 5 days of treatment.  *Hypothyroidism Continue levothyroxine.  * thrombocytopenia   Due to liver cirrhosis and Hep C- monitor   All the records are reviewed and case discussed with Care Management/Social Workerr. Management plans discussed with the patient, family and they are in agreement.  CODE STATUS: Full.  TOTAL TIME TAKING CARE OF THIS PATIENT: 35 minutes.     POSSIBLE D/C IN 1-2 DAYS, DEPENDING ON CLINICAL CONDITION.   Vaughan Basta M.D on 06/25/2019   Between 7am to 6pm - Pager - [phone]  After 6pm go to [email or web address] - password [email or web address] [facility]  Office  [phone]  CC: Primary care physician; Cletis Athens, MD  Note: This dictation was prepared with Dragon dictation along with smaller phrase technology. Any transcriptional errors that result from this process are unintentional.

## 2019-06-25 NOTE — Transfer of Care (Signed)
Immediate Anesthesia Transfer of Care Note  Patient: Brandi Bright  Procedure(s) Performed: ESOPHAGOGASTRODUODENOSCOPY (EGD) WITH PROPOFOL (N/A )  Patient Location: PACU  Anesthesia Type:General  Level of Consciousness: awake, alert  and oriented  Airway & Oxygen Therapy: Patient Spontanous Breathing and Patient connected to nasal cannula oxygen  Post-op Assessment: Report given to RN and Post -op Vital signs reviewed and stable  Post vital signs: Reviewed and stable  Last Vitals:  Vitals Value Taken Time  BP    Temp    Pulse    Resp    SpO2      Last Pain:  Vitals:   06/25/19 1201  TempSrc: Oral  PainSc:       Patients Stated Pain Goal: 0 (10/40/45 9136)  Complications: No apparent anesthesia complications

## 2019-06-25 NOTE — Progress Notes (Signed)
*  PRELIMINARY RESULTS* Echocardiogram 2D Echocardiogram has been performed.  Brandi Bright 06/25/2019, 11:35 AM

## 2019-06-25 NOTE — Anesthesia Post-op Follow-up Note (Signed)
Anesthesia QCDR form completed.        

## 2019-06-25 NOTE — H&P (Signed)
Jonathon Bellows, MD [address], [place], [place], [place], [address] [address], [place], [place], [place], [address] Phone: [phone]  Fax: [phone]  Primary Care Physician:  Cletis Athens, MD   Pre-Procedure History & Physical: HPI:  Brandi Bright is a 48 y.o. female is here for an endoscopy    Past Medical History:  Diagnosis Date  . Arthritis   . Back pain   . Gout   . Hypothyroidism   . MI, old   . Thyroid disease     Past Surgical History:  Procedure Laterality Date  . CARDIAC CATHETERIZATION    . CESAREAN SECTION    . COLONOSCOPY WITH PROPOFOL N/A 03/16/2018   Procedure: COLONOSCOPY WITH PROPOFOL;  Surgeon: Virgel Manifold, MD;  Location: [facility] ENDOSCOPY;  Service: Endoscopy;  Laterality: N/A;  . ESOPHAGOGASTRODUODENOSCOPY (EGD) WITH PROPOFOL N/A 10/27/2018   Procedure: ESOPHAGOGASTRODUODENOSCOPY (EGD) WITH PROPOFOL;  Surgeon: Virgel Manifold, MD;  Location: [facility] ENDOSCOPY;  Service: Endoscopy;  Laterality: N/A;  . TUBAL LIGATION      Prior to Admission medications   Medication Sig Start Date End Date Taking? Authorizing Provider  carvedilol (COREG) 12.5 MG tablet Take 1 tablet (12.5 mg total) by mouth 2 (two) times daily with a meal. 02/22/19  Yes Tahiliani, Margretta Sidle B, MD  diclofenac sodium (VOLTAREN) 1 % GEL Apply 5 g topically 4 (four) times daily. 06/06/19  Yes [provider]  Iron-Vitamin C (VITRON-C) 65-125 MG TABS Take 1 tablet by mouth 2 (two) times a day.   Yes [provider]  lactulose (CHRONULAC) 10 GM/15ML solution Take 10 g by mouth daily as needed for mild constipation.  05/02/19  Yes [provider]  levothyroxine (SYNTHROID, LEVOTHROID) 200 MCG tablet Take 200 mcg by mouth daily. 11/26/17  Yes [provider]  meloxicam (MOBIC) 7.5 MG tablet Take 7.5 mg by mouth 2 (two) times a day.   Yes [provider]  nitroGLYCERIN (NITROLINGUAL) 0.4 MG/SPRAY spray Place 1 spray under the tongue  every 5 (five) minutes x 3 doses as needed for chest pain.   Yes [provider]  omeprazole (PRILOSEC) 20 MG capsule Take 20 mg by mouth daily.  07/26/18  Yes [provider]  oxyCODONE-acetaminophen (PERCOCET/ROXICET) 5-325 MG tablet Take 1 tablet by mouth 4 (four) times daily.  01/26/19  Yes [provider]  spironolactone (ALDACTONE) 50 MG tablet Take 50 mg by mouth daily. 08/21/18  Yes [provider]  venlafaxine XR (EFFEXOR-XR) 75 MG 24 hr capsule Take 75 mg by mouth daily.  08/22/18  Yes [provider]  vitamin B-12 (CYANOCOBALAMIN) 1000 MCG tablet Take 1 tablet (1,000 mcg total) by mouth daily. 02/11/19  Yes Earlie Server, MD    Allergies as of 06/23/2019 - Review Complete 04/24/2019  Allergen Reaction Noted  . Amoxicillin Rash and Other (See Comments) 01/11/2016  . Gabapentin Rash 01/11/2016  . Vicodin [hydrocodone-acetaminophen] Rash 01/11/2016    Family History  Problem Relation Age of Onset  . Hypertension Other   . CAD Father   . Colon cancer Father   . Lung cancer Father   . Pancreatic cancer Brother     Social History   Socioeconomic History  . Marital status: Single    Spouse name: Not on file  . Number of children: Not on file  . Years of education: Not on file  . Highest education level: Not on file  Occupational History  . Not on file  Social  Needs  . Financial resource strain: Not on file  . Food insecurity    Worry: Not on file    Inability: Not on file  . Transportation needs    Medical: Not on file    Non-medical: Not on file  Tobacco Use  . Smoking status: Never Smoker  . Smokeless tobacco: Never Used  Substance and Sexual Activity  . Alcohol use: Not Currently    Comment: has not had a drink in about 5 months  . Drug use: Never  . Sexual activity: Not Currently  Lifestyle  . Physical activity    Days per week: Not on file    Minutes per session: Not on file  . Stress: Not on file  Relationships  .  Social Herbalist on phone: Not on file    Gets together: Not on file    Attends religious service: Not on file    Active member of club or organization: Not on file    Attends meetings of clubs or organizations: Not on file    Relationship status: Not on file  . Intimate partner violence    Fear of current or ex partner: Not on file    Emotionally abused: Not on file    Physically abused: Not on file    Forced sexual activity: Not on file  Other Topics Concern  . Not on file  Social History Narrative  . Not on file    Review of Systems: See HPI, otherwise negative ROS  Physical Exam: BP 108/85 (BP Location: Left Arm)   Pulse (!) 52   Temp 98.2 F (36.8 C) (Oral)   Resp 19   Ht 4\' 9"  (1.448 m)   Wt 78 kg   LMP  (LMP Unknown)   SpO2 93%   BMI 37.22 kg/m  General:   Alert,  pleasant and cooperative in NAD Head:  Normocephalic and atraumatic. Neck:  Supple; no masses or thyromegaly. Lungs:  Clear throughout to auscultation, normal respiratory effort.    Heart:  +S1, +S2, Regular rate and rhythm, No edema. Abdomen:  Soft, nontender and nondistended. Normal bowel sounds, without guarding, and without rebound.   Neurologic:  Alert and  oriented x4;  grossly normal neurologically.  Impression/Plan: Brandi Bright is here for an endoscopy  to be performed for  evaluation of gi bleed    Risks, benefits, limitations, and alternatives regarding endoscopy have been reviewed with the patient.  Questions have been answered.  All parties agreeable.   Jonathon Bellows, MD  06/25/2019, 12:55 PM

## 2019-06-26 ENCOUNTER — Encounter: Payer: Self-pay | Admitting: Gastroenterology

## 2019-06-26 LAB — CBC
HCT: 35.8 % — ABNORMAL LOW (ref 36.0–46.0)
Hemoglobin: 11.7 g/dL — ABNORMAL LOW (ref 12.0–15.0)
MCH: 31.5 pg (ref 26.0–34.0)
MCHC: 32.7 g/dL (ref 30.0–36.0)
MCV: 96.2 fL (ref 80.0–100.0)
Platelets: 33 10*3/uL — ABNORMAL LOW (ref 150–400)
RBC: 3.72 MIL/uL — ABNORMAL LOW (ref 3.87–5.11)
RDW: 14.8 % (ref 11.5–15.5)
WBC: 1.6 10*3/uL — ABNORMAL LOW (ref 4.0–10.5)
nRBC: 0 % (ref 0.0–0.2)

## 2019-06-26 LAB — ECHOCARDIOGRAM COMPLETE
Height: 57 in
Weight: 2752 oz

## 2019-06-26 MED ORDER — PANTOPRAZOLE SODIUM 40 MG PO TBEC: 40.0000 mg | DELAYED_RELEASE_TABLET | Freq: Two times a day (BID) | ORAL | 1 refills | Status: DC

## 2019-06-26 MED ORDER — PANTOPRAZOLE SODIUM 40 MG PO TBEC: 40.0000 mg | DELAYED_RELEASE_TABLET | Freq: Every day | ORAL | 1 refills | Status: DC

## 2019-06-26 MED ORDER — POTASSIUM CHLORIDE CRYS ER 10 MEQ PO TBCR
EXTENDED_RELEASE_TABLET | ORAL | Status: AC
  Filled 2019-06-26: qty 4

## 2019-06-26 NOTE — Discharge Summary (Signed)
[facility] NAME: Brandi Bright    MR#:  [id number]  DATE OF BIRTH:  03/01/1971  DATE OF ADMISSION:  06/24/2019 ADMITTING PHYSICIAN: Vaughan Basta, MD  DATE OF DISCHARGE: 06/26/2019   PRIMARY CARE PHYSICIAN: Cletis Athens, MD    ADMISSION DIAGNOSIS:  Pancytopenia ([place]) [D61.818] Gastrointestinal hemorrhage, unspecified gastrointestinal hemorrhage type [K92.2]  DISCHARGE DIAGNOSIS:  Principal Problem:   GI bleed   SECONDARY DIAGNOSIS:   Past Medical History:  Diagnosis Date  . Arthritis   . Back pain   . Gout   . Hypothyroidism   . MI, old   . Thyroid disease     HOSPITAL COURSE:   *GI bleed  upper GI possible variceal History of hepatitis C  Currently hemoglobin is stable. Keep n.p.o. and keep on Protonix drip. Octreotide drip GI consult-EGD done showed grade 2 esophageal varices but no active bleed.  Suggested to stop drips and start on PPI BIDand diet as tolerated.  She tolerated diet well and had no more bleed. Advised to follow in GI clinic in 1 to 2 weeks.  *Bradycardia Monitor on telemetry. Follow serial troponin. Hold beta-blockers. Echocardiogram and cardiology consult. Cardiologist agrees with the plan.  No further work-up. We will hold carvedilol on discharge also.  *Hypertension Holding beta-blockers, continue Spironolactone. Blood pressure stable.  *Liver cirrhosis CT abdomen is done no new findings. Hepatitis C. GI suggested to start on ciprofloxacin.    *Hypothyroidism Continue levothyroxine.  * thrombocytopenia Due to liver cirrhosis and Hep C- monitor No active bleed.  Advised to follow with GI clinic for treatment of hepatitis C.  DISCHARGE CONDITIONS:   Stable.  CONSULTS OBTAINED:  Treatment Team:  Jonathon Bellows, MD Vaughan Basta, MD  DRUG ALLERGIES:   Allergies  Allergen Reactions  . Vicodin [Hydrocodone-Acetaminophen] Rash  .  Amoxicillin Rash and Other (See Comments)    Has patient had a PCN reaction causing immediate rash, facial/tongue/throat swelling, SOB or lightheadedness with hypotension: Yes Has patient had a PCN reaction causing severe rash involving mucus membranes or skin necrosis: No Has patient had a PCN reaction that required hospitalization: No Has patient had a PCN reaction occurring within the last 10 years: No If all of the above answers are "NO", then may proceed with Cephalosporin use.   . Gabapentin Rash    DISCHARGE MEDICATIONS:   Allergies as of 06/26/2019      Reactions   Vicodin [hydrocodone-acetaminophen] Rash   Amoxicillin Rash, Other (See Comments)   Has patient had a PCN reaction causing immediate rash, facial/tongue/throat swelling, SOB or lightheadedness with hypotension: Yes Has patient had a PCN reaction causing severe rash involving mucus membranes or skin necrosis: No Has patient had a PCN reaction that required hospitalization: No Has patient had a PCN reaction occurring within the last 10 years: No If all of the above answers are "NO", then may proceed with Cephalosporin use.   Gabapentin Rash      Medication List    STOP taking these medications   carvedilol 12.5 MG tablet Commonly known as: COREG     TAKE these medications   diclofenac sodium 1 % Gel Commonly known as: VOLTAREN Apply 5 g topically 4 (four) times daily.   lactulose 10 GM/15ML solution Commonly known as: CHRONULAC Take 10 g by mouth daily as needed for mild constipation.   levothyroxine 200 MCG tablet Commonly known as: SYNTHROID Take 200 mcg by mouth daily.   meloxicam 7.5 MG  tablet Commonly known as: MOBIC Take 7.5 mg by mouth 2 (two) times a day.   nitroGLYCERIN 0.4 MG/SPRAY spray Commonly known as: NITROLINGUAL Place 1 spray under the tongue every 5 (five) minutes x 3 doses as needed for chest pain.   omeprazole 20 MG capsule Commonly known as: PRILOSEC Take 20 mg by mouth  daily.   oxyCODONE-acetaminophen 5-325 MG tablet Commonly known as: PERCOCET/ROXICET Take 1 tablet by mouth 4 (four) times daily.   pantoprazole 40 MG tablet Commonly known as: Protonix Take 1 tablet (40 mg total) by mouth 2 (two) times daily before a meal.   spironolactone 50 MG tablet Commonly known as: ALDACTONE Take 50 mg by mouth daily.   venlafaxine XR 75 MG 24 hr capsule Commonly known as: EFFEXOR-XR Take 75 mg by mouth daily.   vitamin B-12 1000 MCG tablet Commonly known as: CYANOCOBALAMIN Take 1 tablet (1,000 mcg total) by mouth daily.   Vitron-C 65-125 MG Tabs Generic drug: Iron-Vitamin C Take 1 tablet by mouth 2 (two) times a day.        DISCHARGE INSTRUCTIONS:    Follow with GI clinic in 1-2 weeks.  If you experience worsening of your admission symptoms, develop shortness of breath, life threatening emergency, suicidal or homicidal thoughts you must seek medical attention immediately by calling 911 or calling your MD immediately  if symptoms less severe.  You Must read complete instructions/literature along with all the possible adverse reactions/side effects for all the Medicines you take and that have been prescribed to you. Take any new Medicines after you have completely understood and accept all the possible adverse reactions/side effects.   Please note  You were cared for by a hospitalist during your hospital stay. If you have any questions about your discharge medications or the care you received while you were in the hospital after you are discharged, you can call the unit and asked to speak with the hospitalist on call if the hospitalist that took care of you is not available. Once you are discharged, your primary care physician will handle any further medical issues. Please note that NO REFILLS for any discharge medications will be authorized once you are discharged, as it is imperative that you return to your primary care physician (or establish a  relationship with a primary care physician if you do not have one) for your aftercare needs so that they can reassess your need for medications and monitor your lab values.    Today   CHIEF COMPLAINT:   Chief Complaint  Patient presents with  . Abdominal Pain  . Nausea  . Emesis    HISTORY OF PRESENT ILLNESS:  Brandi Bright  is a 48 y.o. female with a known history of hepatitis C, arthritis, gout, hypothyroidism, MI-came to the hospital after having 2 episodes of small amount of blood in her vomit in the last 24 hours.  She denies any abdominal pain.  She denies any blood in the stool.  Her hemoglobin was stable.  Because of her complaint of hepatitis C and finding of esophageal varices in the past ER physician suggested to admit to hospitalist services. ER physician has ordered octreotide and pantoprazole drip , patient's heart rate dropped up to 20 or 30/min in ER.   VITAL SIGNS:  Blood pressure (!) 124/95, pulse (!) 51, temperature 98.5 F (36.9 C), temperature source Oral, resp. rate 16, height 4\' 9"  (1.448 m), weight 78 kg, SpO2 95 %.  I/O:    Intake/Output Summary (Last  24 hours) at 06/26/2019 1144 Last data filed at 06/26/2019 8657 Gross per 24 hour  Intake 1881.45 ml  Output 2200 ml  Net -318.55 ml    PHYSICAL EXAMINATION:   GENERAL:  48 y.o.-year-old patient lying in the bed with no acute distress.  EYES: Pupils equal, round, reactive to light and accommodation. No scleral icterus. Extraocular muscles intact.  HEENT: Head atraumatic, normocephalic. Oropharynx and nasopharynx clear.  NECK:  Supple, no jugular venous distention. No thyroid enlargement, no tenderness.  LUNGS: Normal breath sounds bilaterally, no wheezing, rales,rhonchi or crepitation. No use of accessory muscles of respiration.  CARDIOVASCULAR: S1, S2 normal. No murmurs, rubs, or gallops.  ABDOMEN: Soft, nontender, nondistended. Bowel sounds present. No organomegaly or mass.  EXTREMITIES: No pedal edema,  cyanosis, or clubbing.  NEUROLOGIC: Cranial nerves II through XII are intact. Muscle strength 5/5 in all extremities. Sensation intact. Gait not checked.  PSYCHIATRIC: The patient is alert and oriented x 3.  SKIN: No obvious rash, lesion, or ulcer.   DATA REVIEW:   CBC Recent Labs  Lab 06/26/19 0404  WBC 1.6*  HGB 11.7*  HCT 35.8*  PLT 33*    Chemistries  Recent Labs  Lab 06/24/19 0026 06/25/19 0419  NA 140 141  K 3.8 3.8  CL 109 108  CO2 26 28  GLUCOSE 113* 94  BUN 14 10  CREATININE 0.63 0.68  CALCIUM 9.0 8.1*  AST 193*  --   ALT 154*  --   ALKPHOS 86  --   BILITOT 1.1  --     Cardiac Enzymes No results for input(s): TROPONINI in the last 168 hours.  Microbiology Results  Results for orders placed or performed during the hospital encounter of 06/24/19  SARS Coronavirus 2 [facility] order, Performed in [facility] hospital lab)     Status: None   Collection Time: 06/24/19  3:15 PM  Result Value Ref Range Status   SARS Coronavirus 2 NEGATIVE NEGATIVE Final    Comment: (NOTE) If result is NEGATIVE SARS-CoV-2 target nucleic acids are NOT DETECTED. The SARS-CoV-2 RNA is generally detectable in upper and lower  respiratory specimens during the acute phase of infection. The lowest  concentration of SARS-CoV-2 viral copies this assay can detect is 250  copies / mL. A negative result does not preclude SARS-CoV-2 infection  and should not be used as the sole basis for treatment or other  patient management decisions.  A negative result may occur with  improper specimen collection / handling, submission of specimen other  than nasopharyngeal swab, presence of viral mutation(s) within the  areas targeted by this assay, and inadequate number of viral copies  (<250 copies / mL). A negative result must be combined with clinical  observations, patient history, and epidemiological information. If result is POSITIVE SARS-CoV-2 target nucleic acids are DETECTED. The  SARS-CoV-2 RNA is generally detectable in upper and lower  respiratory specimens dur ing the acute phase of infection.  Positive  results are indicative of active infection with SARS-CoV-2.  Clinical  correlation with patient history and other diagnostic information is  necessary to determine patient infection status.  Positive results do  not rule out bacterial infection or co-infection with other viruses. If result is PRESUMPTIVE POSTIVE SARS-CoV-2 nucleic acids MAY BE PRESENT.   A presumptive positive result was obtained on the submitted specimen  and confirmed on repeat testing.  While 2019 novel coronavirus  (SARS-CoV-2) nucleic acids may be present in the submitted sample  additional confirmatory testing  may be necessary for epidemiological  and / or clinical management purposes  to differentiate between  SARS-CoV-2 and other Sarbecovirus currently known to infect humans.  If clinically indicated additional testing with an alternate test  methodology [phone]) is advised. The SARS-CoV-2 RNA is generally  detectable in upper and lower respiratory sp ecimens during the acute  phase of infection. The expected result is Negative. Fact Sheet for Patients:  StrictlyIdeas.no Fact Sheet for Healthcare Providers: BankingDealers.co.za This test is not yet approved or cleared by the [place] FDA and has been authorized for detection and/or diagnosis of SARS-CoV-2 by FDA under an Emergency Use Authorization (EUA).  This EUA will remain in effect (meaning this test can be used) for the duration of the COVID-19 declaration under Section 564(b)(1) of the Act, 21 U.S.C. section 360bbb-3(b)(1), unless the authorization is terminated or revoked sooner. Performed at [facility], [address] [place], [place] [address]     RADIOLOGY:  No results found.  EKG:   Orders placed or performed during the hospital encounter of  06/24/19  . EKG 12-Lead  . EKG 12-Lead  . ED EKG  . ED EKG  . EKG 12-Lead  . EKG 12-Lead      Management plans discussed with the patient, family and they are in agreement.  CODE STATUS:     Code Status Orders  (From admission, onward)         Start     Ordered   06/24/19 1106  Full code  Continuous     06/24/19 1105        Code Status History    Date Active Date Inactive Code Status Order ID Comments User Context   12/29/2017 0539 12/29/2017 2005 Full Code [id number]  Harrie Foreman, MD Inpatient   Advance Care Planning Activity      TOTAL TIME TAKING CARE OF THIS PATIENT: 35 minutes.    Vaughan Basta M.D on 06/26/2019 at 11:44 AM  Between 7am to 6pm - Pager - [phone]  After 6pm go to [email or web address] - password [email or web address] [place] [facility]  Office  [phone]  CC: Primary care physician; Cletis Athens, MD   Note: This dictation was prepared with Dragon dictation along with smaller phrase technology. Any transcriptional errors that result from this process are unintentional.

## 2019-06-27 LAB — HIV ANTIBODY (ROUTINE TESTING W REFLEX): HIV Screen 4th Generation wRfx: NONREACTIVE

## 2019-06-28 ENCOUNTER — Ambulatory Visit: Payer: Medicaid Other | Admitting: Gastroenterology

## 2019-06-28 ENCOUNTER — Encounter: Payer: Self-pay | Admitting: Gastroenterology

## 2019-06-28 ENCOUNTER — Other Ambulatory Visit: Payer: Self-pay

## 2019-06-28 VITALS — BP 120/83 | HR 66 | Temp 98.0°F | Resp 16 | Ht 59.0 in | Wt 200.8 lb

## 2019-06-28 DIAGNOSIS — K7469 Other cirrhosis of liver: Secondary | ICD-10-CM

## 2019-06-28 DIAGNOSIS — B1710 Acute hepatitis C without hepatic coma: Secondary | ICD-10-CM | POA: Diagnosis not present

## 2019-06-28 DIAGNOSIS — I8500 Esophageal varices without bleeding: Secondary | ICD-10-CM | POA: Diagnosis not present

## 2019-06-28 DIAGNOSIS — B182 Chronic viral hepatitis C: Secondary | ICD-10-CM | POA: Diagnosis not present

## 2019-06-28 NOTE — Progress Notes (Signed)
Brandi Antigua, MD [address]  [place], [place] [address]  Main: [phone]  Fax: [phone]   Primary Care Physician: Cletis Athens, MD   CC: follow up from hospital stay  HPI: Brandi Bright is a 48 y.o. female with history of cirrhosis and hep C here for follow-up.  Patient has repeatedly failed to follow-up with work-up and recommendations, including hep C viral load, genotype testing which has thus delayed starting treatment.  She was recently hospitalized with hematemesis and upper endoscopy showed known grade 2 esophageal varices without red wale sign.Dr. Vicente Males completed the procedure and recommended oral PPI.  Lowest hemoglobin during her admission was 11.3 which is around her baseline.  She was discharged on Protonix. Her Coreg was stopped due to bradycardia on that admission  Patient is taking lactulose every day.  Reports having a bowel movement every 2 to 3 days.  Is on iron replacement by Dr. Tasia Catchings.  Had a screening colonoscopy in April 2019 with less than 1 cm polyp removed and repeat recommended in 3 years.  Variceal screening up-to-date, November 2019.  Patient was placed on Coreg twice daily for primary prophylaxis.  Current Outpatient Medications  Medication Sig Dispense Refill  . diclofenac sodium (VOLTAREN) 1 % GEL Apply 5 g topically 4 (four) times daily.    . Iron-Vitamin C (VITRON-C) 65-125 MG TABS Take 1 tablet by mouth 2 (two) times a day.    . lactulose (CHRONULAC) 10 GM/15ML solution Take 10 g by mouth daily as needed for mild constipation.     Marland Kitchen levothyroxine (SYNTHROID, LEVOTHROID) 200 MCG tablet Take 200 mcg by mouth daily.  8  . meloxicam (MOBIC) 7.5 MG tablet Take 7.5 mg by mouth 2 (two) times a day.    . nitroGLYCERIN (NITROLINGUAL) 0.4 MG/SPRAY spray Place 1 spray under the tongue every 5 (five) minutes x 3 doses as needed for chest pain.    Marland Kitchen oxyCODONE-acetaminophen (PERCOCET/ROXICET) 5-325 MG tablet Take 1 tablet by mouth  4 (four) times daily.     . pantoprazole (PROTONIX) 40 MG tablet Take 1 tablet (40 mg total) by mouth 2 (two) times daily before a meal. 60 tablet 1  . spironolactone (ALDACTONE) 50 MG tablet Take 50 mg by mouth daily.  5  . venlafaxine XR (EFFEXOR-XR) 75 MG 24 hr capsule Take 75 mg by mouth daily.   4  . vitamin B-12 (CYANOCOBALAMIN) 1000 MCG tablet Take 1 tablet (1,000 mcg total) by mouth daily. 90 tablet 1  . omeprazole (PRILOSEC) 20 MG capsule Take 20 mg by mouth daily.   4   No current facility-administered medications for this visit.     Allergies as of 06/28/2019 - Review Complete 06/28/2019  Allergen Reaction Noted  . Vicodin [hydrocodone-acetaminophen] Rash 01/11/2016  . Amoxicillin Rash and Other (See Comments) 01/11/2016  . Gabapentin Rash 01/11/2016    ROS:  General: Negative for anorexia, weight loss, fever, chills, fatigue, weakness. ENT: Negative for hoarseness, difficulty swallowing , nasal congestion. CV: Negative for chest pain, angina, palpitations, dyspnea on exertion, peripheral edema.  Respiratory: Negative for dyspnea at rest, dyspnea on exertion, cough, sputum, wheezing.  GI: See history of present illness. GU:  Negative for dysuria, hematuria, urinary incontinence, urinary frequency, nocturnal urination.  Endo: Negative for unusual weight change.    Physical Examination:   BP 120/83 (BP Location: Left Arm, Patient Position: Sitting, Cuff Size: Large)   Pulse 66   Temp 98 F (36.7 C)  Resp 16   Ht 4\' 11"  (1.499 m)   Wt 200 lb 12.8 oz (91.1 kg)   LMP  (LMP Unknown)   BMI 40.56 kg/m   General: Well-nourished, well-developed in no acute distress.  Eyes: No icterus. Conjunctivae pink. Mouth: Oropharyngeal mucosa moist and pink , no lesions erythema or exudate. Neck: Supple, Trachea midline Abdomen: Bowel sounds are normal, nontender, nondistended, no hepatosplenomegaly or masses, no abdominal bruits or hernia , no rebound or guarding.   Extremities:  No lower extremity edema. No clubbing or deformities. Neuro: Alert and oriented x 3.  Grossly intact. Skin: Warm and dry, no jaundice.   Psych: Alert and cooperative, normal mood and affect.   Labs: CMP     Component Value Date/Time   NA 141 06/25/2019 0419   NA 139 08/31/2018 1444   NA 137 08/25/2013 0617   K 3.8 06/25/2019 0419   K 4.0 08/25/2013 0617   CL 108 06/25/2019 0419   CL 104 08/25/2013 0617   CO2 28 06/25/2019 0419   CO2 29 08/25/2013 0617   GLUCOSE 94 06/25/2019 0419   GLUCOSE 97 08/25/2013 0617   BUN 10 06/25/2019 0419   BUN 9 08/31/2018 1444   BUN 9 08/25/2013 0617   CREATININE 0.68 06/25/2019 0419   CREATININE 0.68 08/25/2013 0617   CALCIUM 8.1 (L) 06/25/2019 0419   CALCIUM 8.8 08/25/2013 0617   PROT 7.0 06/24/2019 0026   PROT 6.6 08/31/2018 1444   PROT 8.8 (H) 05/30/2013 1242   ALBUMIN 3.3 (L) 06/24/2019 0026   ALBUMIN 3.2 (L) 08/31/2018 1444   ALBUMIN 4.4 05/30/2013 1242   AST 193 (H) 06/24/2019 0026   AST 163 (H) 05/30/2013 1242   ALT 154 (H) 06/24/2019 0026   ALT 144 (H) 05/30/2013 1242   ALKPHOS 86 06/24/2019 0026   ALKPHOS 89 05/30/2013 1242   BILITOT 1.1 06/24/2019 0026   BILITOT 0.9 08/31/2018 1444   BILITOT 0.8 05/30/2013 1242   GFRNONAA >60 06/25/2019 0419   GFRNONAA >60 08/25/2013 0617   GFRAA >60 06/25/2019 0419   GFRAA >60 08/25/2013 0617   Lab Results  Component Value Date   WBC 1.6 (L) 06/26/2019   HGB 11.7 (L) 06/26/2019   HCT 35.8 (L) 06/26/2019   MCV 96.2 06/26/2019   PLT 33 (L) 06/26/2019    Imaging Studies: Ct Abdomen Pelvis W Contrast  Result Date: 06/24/2019 CLINICAL DATA:  Pt reports she was laying on the couch around 1030 pm when she became dizzy, developed n/v and epigastric pain. Reports she spit up about 3 times. The first time she reports it was a little bloody looking. Denies shortness of breath or diaphoresis with the painHx: tubal ligation, c-section. EXAM: CT ABDOMEN AND PELVIS WITH CONTRAST TECHNIQUE:  Multidetector CT imaging of the abdomen and pelvis was performed using the standard protocol following bolus administration of intravenous contrast. CONTRAST:  184mL OMNIPAQUE IOHEXOL 300 MG/ML  SOLN COMPARISON:  07/14/2018 FINDINGS: Lower chest: Clear lung bases.  Heart normal in size. Hepatobiliary: Liver shows central volume loss and surface nodularity consistent with cirrhosis. No liver mass or focal lesion. Normal gallbladder. No bile duct dilation. Pancreas: Unremarkable. No pancreatic ductal dilatation or surrounding inflammatory changes. Spleen: Enlarged spleen measuring 18.9 x 6.7 x 14.4 cm. No splenic mass or focal lesion. Adrenals/Urinary Tract: Adrenal glands are unremarkable. Kidneys are normal, without renal calculi, focal lesion, or hydronephrosis. Bladder is unremarkable. Stomach/Bowel: Normal stomach. Small bowel and colon are normal in caliber. No wall thickening or inflammation.  No evidence of appendicitis. Vascular/Lymphatic: There are prominent upper abdominal venous collaterals, extending along the anterior surface of the liver to the umbilicus, and along the coronary vein to merge with para softened she will varices. These findings are stable from the prior CT. Mild aortic atherosclerosis. No other vascular abnormality. No pathologically enlarged lymph nodes. Reproductive: Uterus and bilateral adnexa are unremarkable. Other: No abdominal wall hernia or abnormality. No abdominopelvic ascites. Musculoskeletal: No fracture or acute finding. No osteoblastic or osteolytic lesions. IMPRESSION: 1. No acute findings. 2. Cirrhosis with portal venous hypertension reflected by splenomegaly and venous collaterals, similar to the prior CT. No ascites on the current exam. 3. Mild aortic atherosclerosis. Electronically Signed   By: Lajean Manes M.D.   On: 06/24/2019 04:24    Assessment and Plan:   ALETTE KATAOKA is a 48 y.o. y/o female History of hep C, treatment nave, and cirrhosis here for follow-up   Patient noncompliant with previous testing and work-up in the past Now willing to get lab work done for hep C so we can initiate treatment Labs ordered  Ultrasound up-to-date on August 1 consistent with cirrhosis, portal hypertension No liver lesions  Known grade 2 esophageal varices without any history of bleeding from them Since Coreg was discontinued due to bradycardia, we may need to consider banding for primary prophylaxis  We will first start patient on hep C treatment, and schedule variceal banding in the future  However, patient is following with cardiology and if bradycardia improves, and cardiology is able to safely place her back on Coreg, may be able to continue her on beta-blockers for primary prophylaxis  I have asked her to increase her lactulose dose to 2 doses a day as she is not having a bowel movement every day.  Goal of 2-3 soft bowel movements a day and she verbalized understanding.  No episodes of confusion at this time.  Importance of close follow-up explained in detail.  Risks of untreated hep C developing liver cancer discussed as well   Dr Brandi Bright

## 2019-06-30 DIAGNOSIS — B188 Other chronic viral hepatitis: Secondary | ICD-10-CM | POA: Diagnosis not present

## 2019-06-30 DIAGNOSIS — R5381 Other malaise: Secondary | ICD-10-CM | POA: Diagnosis not present

## 2019-06-30 DIAGNOSIS — E034 Atrophy of thyroid (acquired): Secondary | ICD-10-CM | POA: Diagnosis not present

## 2019-06-30 DIAGNOSIS — I10 Essential (primary) hypertension: Secondary | ICD-10-CM | POA: Diagnosis not present

## 2019-07-07 DIAGNOSIS — D509 Iron deficiency anemia, unspecified: Secondary | ICD-10-CM | POA: Diagnosis not present

## 2019-07-07 DIAGNOSIS — I208 Other forms of angina pectoris: Secondary | ICD-10-CM | POA: Diagnosis not present

## 2019-07-07 DIAGNOSIS — I252 Old myocardial infarction: Secondary | ICD-10-CM | POA: Diagnosis not present

## 2019-07-07 DIAGNOSIS — M6281 Muscle weakness (generalized): Secondary | ICD-10-CM | POA: Diagnosis not present

## 2019-07-07 LAB — COMPREHENSIVE METABOLIC PANEL

## 2019-07-07 LAB — HCV RNA QUANT
HCV log10: 5.083 log10 IU/mL
Hepatitis C Quantitation: 121000 IU/mL

## 2019-07-07 LAB — AFP TUMOR MARKER

## 2019-07-07 LAB — HEPATITIS C GENOTYPE

## 2019-07-07 LAB — HCV FIBROSURE

## 2019-07-07 LAB — PROTIME-INR
INR: 1.3 — ABNORMAL HIGH (ref 0.8–1.2)
Prothrombin Time: 13.4 s — ABNORMAL HIGH (ref 9.1–12.0)

## 2019-07-11 ENCOUNTER — Encounter: Payer: Self-pay | Admitting: Gastroenterology

## 2019-07-17 DIAGNOSIS — M545 Low back pain: Secondary | ICD-10-CM | POA: Diagnosis not present

## 2019-07-17 DIAGNOSIS — M25512 Pain in left shoulder: Secondary | ICD-10-CM | POA: Diagnosis not present

## 2019-07-17 DIAGNOSIS — M542 Cervicalgia: Secondary | ICD-10-CM | POA: Diagnosis not present

## 2019-07-17 DIAGNOSIS — G894 Chronic pain syndrome: Secondary | ICD-10-CM | POA: Diagnosis not present

## 2019-07-24 ENCOUNTER — Telehealth: Payer: Self-pay | Admitting: Gastroenterology

## 2019-07-24 DIAGNOSIS — S46812D Strain of other muscles, fascia and tendons at shoulder and upper arm level, left arm, subsequent encounter: Secondary | ICD-10-CM | POA: Diagnosis not present

## 2019-07-24 DIAGNOSIS — M542 Cervicalgia: Secondary | ICD-10-CM | POA: Diagnosis not present

## 2019-07-24 NOTE — Telephone Encounter (Signed)
Pt left vm to get her Lab results from 06/29/19

## 2019-07-31 ENCOUNTER — Telehealth: Payer: Self-pay | Admitting: Gastroenterology

## 2019-07-31 NOTE — Telephone Encounter (Signed)
Patient called & would like to know her lab results from visit on 06-28-19 with Dr Bonna Gains.

## 2019-08-03 ENCOUNTER — Other Ambulatory Visit: Payer: Self-pay

## 2019-08-03 DIAGNOSIS — B1710 Acute hepatitis C without hepatic coma: Secondary | ICD-10-CM

## 2019-08-03 NOTE — Telephone Encounter (Signed)
Pt notified of lab results. Pt coming in on Tuesday to get Fibrosure lab repeated.

## 2019-08-03 NOTE — Telephone Encounter (Signed)
Patient called again & would like to know her results.

## 2019-08-03 NOTE — Telephone Encounter (Signed)
-----   Message from Virgel Manifold, MD sent at 07/25/2019  3:15 PM EDT ----- Brandi Bright, this patient's hepatitis C genotype and viral load is back.  Can you get her approved for treatment.  Her FibroSure level was canceled for some reason.  Can you order repeat test and let her know.  Please let her know her liver enzymes are elevated like before, likely due to her underlying hepatitis C.  Therefore it is important that she follows up and gets started on treatment

## 2019-08-10 DIAGNOSIS — B1710 Acute hepatitis C without hepatic coma: Secondary | ICD-10-CM | POA: Diagnosis not present

## 2019-08-11 ENCOUNTER — Encounter: Payer: Self-pay | Admitting: Oncology

## 2019-08-11 ENCOUNTER — Other Ambulatory Visit: Payer: Self-pay | Admitting: Oncology

## 2019-08-11 ENCOUNTER — Other Ambulatory Visit: Payer: Self-pay

## 2019-08-11 NOTE — Progress Notes (Signed)
Patient reports dizziness.

## 2019-08-12 LAB — HCV FIBROSURE
ALPHA 2-MACROGLOBULINS, QN: 393 mg/dL — ABNORMAL HIGH (ref 110–276)
ALT (SGPT) P5P: 97 IU/L — ABNORMAL HIGH (ref 0–40)
Apolipoprotein A-1: 79 mg/dL — ABNORMAL LOW (ref 116–209)
Bilirubin, Total: 1 mg/dL (ref 0.0–1.2)
Fibrosis Score: 0.93 — ABNORMAL HIGH (ref 0.00–0.21)
GGT: 34 IU/L (ref 0–60)
Haptoglobin: <10 mg/dL — ABNORMAL LOW (ref 42–296)
Necroinflammat Activity Score: 0.77 — ABNORMAL HIGH (ref 0.00–0.17)

## 2019-08-14 ENCOUNTER — Other Ambulatory Visit: Payer: Self-pay

## 2019-08-14 ENCOUNTER — Inpatient Hospital Stay: Payer: Medicaid Other

## 2019-08-14 ENCOUNTER — Inpatient Hospital Stay: Payer: Medicaid Other | Attending: Oncology | Admitting: Oncology

## 2019-08-14 VITALS — BP 120/85 | HR 87 | Temp 98.2°F | Wt 207.0 lb

## 2019-08-14 DIAGNOSIS — D72819 Decreased white blood cell count, unspecified: Secondary | ICD-10-CM | POA: Diagnosis not present

## 2019-08-14 DIAGNOSIS — B182 Chronic viral hepatitis C: Secondary | ICD-10-CM | POA: Diagnosis not present

## 2019-08-14 DIAGNOSIS — D509 Iron deficiency anemia, unspecified: Secondary | ICD-10-CM

## 2019-08-14 DIAGNOSIS — D696 Thrombocytopenia, unspecified: Secondary | ICD-10-CM | POA: Diagnosis not present

## 2019-08-14 DIAGNOSIS — R790 Abnormal level of blood mineral: Secondary | ICD-10-CM | POA: Diagnosis not present

## 2019-08-14 DIAGNOSIS — E039 Hypothyroidism, unspecified: Secondary | ICD-10-CM | POA: Insufficient documentation

## 2019-08-14 DIAGNOSIS — Z791 Long term (current) use of non-steroidal anti-inflammatories (NSAID): Secondary | ICD-10-CM | POA: Diagnosis not present

## 2019-08-14 DIAGNOSIS — R161 Splenomegaly, not elsewhere classified: Secondary | ICD-10-CM

## 2019-08-14 DIAGNOSIS — Z79899 Other long term (current) drug therapy: Secondary | ICD-10-CM | POA: Diagnosis not present

## 2019-08-14 DIAGNOSIS — I252 Old myocardial infarction: Secondary | ICD-10-CM | POA: Diagnosis not present

## 2019-08-14 LAB — CBC WITH DIFFERENTIAL/PLATELET
Abs Immature Granulocytes: 0 10*3/uL (ref 0.00–0.07)
Basophils Absolute: 0 10*3/uL (ref 0.0–0.1)
Basophils Relative: 1 %
Eosinophils Absolute: 0 10*3/uL (ref 0.0–0.5)
Eosinophils Relative: 1 %
HCT: 38.3 % (ref 36.0–46.0)
Hemoglobin: 12.8 g/dL (ref 12.0–15.0)
Immature Granulocytes: 0 %
Lymphocytes Relative: 29 %
Lymphs Abs: 0.6 10*3/uL — ABNORMAL LOW (ref 0.7–4.0)
MCH: 31.4 pg (ref 26.0–34.0)
MCHC: 33.4 g/dL (ref 30.0–36.0)
MCV: 93.9 fL (ref 80.0–100.0)
Monocytes Absolute: 0.2 10*3/uL (ref 0.1–1.0)
Monocytes Relative: 10 %
Neutro Abs: 1.1 10*3/uL — ABNORMAL LOW (ref 1.7–7.7)
Neutrophils Relative %: 59 %
Platelets: 37 10*3/uL — ABNORMAL LOW (ref 150–400)
RBC: 4.08 MIL/uL (ref 3.87–5.11)
RDW: 13.2 % (ref 11.5–15.5)
WBC: 1.9 10*3/uL — ABNORMAL LOW (ref 4.0–10.5)
nRBC: 0 % (ref 0.0–0.2)

## 2019-08-14 LAB — IRON AND TIBC
Iron: 139 ug/dL (ref 28–170)
Saturation Ratios: 34 % — ABNORMAL HIGH (ref 10.4–31.8)
TIBC: 409 ug/dL (ref 250–450)
UIBC: 270 ug/dL

## 2019-08-14 LAB — FERRITIN: Ferritin: 26 ng/mL (ref 11–307)

## 2019-08-15 LAB — VITAMIN B12: Vitamin B-12: 239 pg/mL (ref 180–914)

## 2019-08-15 MED ORDER — VITRON-C 65-125 MG PO TABS: 1.0000 | ORAL_TABLET | Freq: Every day | ORAL | 3 refills | Status: DC

## 2019-08-15 NOTE — Progress Notes (Signed)
[facility] day:  08/15/2019  Chief Complaint: Brandi Bright is a 48 y.o. female follows up for management of iron deficiency anemia, thrombocytopenia, and leukopenia   PERTINENT HEMATOLOGY HISTORY Patient follows up with Dr. Mike Gip previously.  Establish care with me on 02/09/2019. Reviewed patient's previous medical records, labs, imaging results. # History of cirrhosis and hepatitis C.  She has a history of chronic anemia, thrombocytopenia and leukopenia dating back to 03/2012.  Platelet count has fluctuated between 54,000 - 56,000 since 12/28/2017 (previously 73,000 - 134,000).  Ferritin was 14 on 08/31/2018.    Work-up on 09/23/2018 revealed a hematocrit of 31.0, hemoglobin 10.1, MCV 87.6, platelets 46,000, WBC 1900 with an ANC of 1100.  Ferritin was 10 (low) with iron saturation 16% with a TIBC of 401.  Retic was 1.2%.  B12 was 315.  Normal studies included: folate, SPEP, and free light chain ratio.  Copper was 69 (72-166).  ANA was + with double stranded DNA antibody 13 (0-9).  TSH was 0.036 (0.35-4.5) with a free T4 1.05 (0.82-1.77).  Peripheral smear revealed variant lymphocytes.  Ferritin has been followed:  14 on 08/31/2018 and 10 on 09/23/2018.  Abdomen and pelvic CT on 07/14/2018 revealed cirrhosis with portal hypertension noted by prominent splenomegaly (18.5 x 13.8 x 8.1 cm; volume 1100 cm3), enlarged portal veins with recannulated umbilical vein, paraesophageal and perigastric varices.  There was mild thickening of the cecum and ascending colon.  EGD on 10/27/2018 revealed grade II esophageal varices.  There was erythematous mucosa in the antrum.  There was congested, erythematous, friable (with contact bleeding), granular and nodular mucosa in the gastric fundus and astric body.  There was a single gastric polyp (polypoid ulcerated antral type mucos with chronic active mucosal inflammation).  There was a normal duodenal bulb, second  portion of the duodenum and examined duodenum.  The patient's iron deficiency could be explained by her friable gastric mucosa (likely from portal hypertension).  There was no history of variceal bleeding and thus this is not a cause of her iron deficiency.  INTERVAL HISTORY Brandi Bright is a 48 y.o. female who has above history reviewed by me today presents for follow up visit for management of thrombocytopenia, splenomegaly, leukopenia and anemia. Problems and complaints are listed below: Patient reports doing well at baseline. She has easy bruising. Denies hematochezia, hematuria, hematemesis, epistaxis, black tarry stool or easy bruising.  Chronic fatigue is at baseline.  She reports some dizziness today.  Review of Systems  Constitutional: Negative for appetite change, chills, fatigue and fever.  HENT:   Negative for hearing loss and voice change.   Eyes: Negative for eye problems.  Respiratory: Negative for chest tightness and cough.   Cardiovascular: Negative for chest pain.  Gastrointestinal: Negative for abdominal distention, abdominal pain and blood in stool.  Endocrine: Negative for hot flashes.  Genitourinary: Negative for difficulty urinating and frequency.   Musculoskeletal: Negative for arthralgias.  Skin: Negative for itching and rash.  Neurological: Negative for extremity weakness.  Hematological: Negative for adenopathy. Bruises/bleeds easily.  Psychiatric/Behavioral: Negative for confusion.   Past Medical History:  Diagnosis Date  . Arthritis   . Back pain   . Gout   . Hypothyroidism   . MI, old   . Thyroid disease     Past Surgical History:  Procedure Laterality Date  . CARDIAC CATHETERIZATION    . CESAREAN SECTION    . COLONOSCOPY WITH PROPOFOL N/A 03/16/2018  Procedure: COLONOSCOPY WITH PROPOFOL;  Surgeon: Virgel Manifold, MD;  Location: [facility] ENDOSCOPY;  Service: Endoscopy;  Laterality: N/A;  . ESOPHAGOGASTRODUODENOSCOPY (EGD) WITH PROPOFOL N/A  10/27/2018   Procedure: ESOPHAGOGASTRODUODENOSCOPY (EGD) WITH PROPOFOL;  Surgeon: Virgel Manifold, MD;  Location: [facility] ENDOSCOPY;  Service: Endoscopy;  Laterality: N/A;  . ESOPHAGOGASTRODUODENOSCOPY (EGD) WITH PROPOFOL N/A 06/25/2019   Procedure: ESOPHAGOGASTRODUODENOSCOPY (EGD) WITH PROPOFOL;  Surgeon: Jonathon Bellows, MD;  Location: [facility] ENDOSCOPY;  Service: Gastroenterology;  Laterality: N/A;  . TUBAL LIGATION      Family History  Problem Relation Age of Onset  . Hypertension Other   . CAD Father   . Colon cancer Father   . Lung cancer Father   . Pancreatic cancer Brother    Social History   Socioeconomic History  . Marital status: Single    Spouse name: Not on file  . Number of children: Not on file  . Years of education: Not on file  . Highest education level: Not on file  Occupational History  . Not on file  Social Needs  . Financial resource strain: Not on file  . Food insecurity    Worry: Not on file    Inability: Not on file  . Transportation needs    Medical: Not on file    Non-medical: Not on file  Tobacco Use  . Smoking status: Never Smoker  . Smokeless tobacco: Never Used  Substance and Sexual Activity  . Alcohol use: Not Currently    Comment: has not had a drink in about 5 months  . Drug use: Never  . Sexual activity: Not Currently  Lifestyle  . Physical activity    Days per week: Not on file    Minutes per session: Not on file  . Stress: Not on file  Relationships  . Social Herbalist on phone: Not on file    Gets together: Not on file    Attends religious service: Not on file    Active member of club or organization: Not on file    Attends meetings of clubs or organizations: Not on file    Relationship status: Not on file  . Intimate partner violence    Fear of current or ex partner: Not on file    Emotionally abused: Not on file    Physically abused: Not on file    Forced sexual activity: Not on file  Other Topics Concern  . Not  on file  Social History Narrative  . Not on file    Allergies:  Allergies  Allergen Reactions  . Vicodin [Hydrocodone-Acetaminophen] Rash  . Amoxicillin Rash and Other (See Comments)    Has patient had a PCN reaction causing immediate rash, facial/tongue/throat swelling, SOB or lightheadedness with hypotension: Yes Has patient had a PCN reaction causing severe rash involving mucus membranes or skin necrosis: No Has patient had a PCN reaction that required hospitalization: No Has patient had a PCN reaction occurring within the last 10 years: No If all of the above answers are "NO", then may proceed with Cephalosporin use.   . Gabapentin Rash    Current Medications: Current Outpatient Medications  Medication Sig Dispense Refill  . diclofenac sodium (VOLTAREN) 1 % GEL Apply 5 g topically 4 (four) times daily.    . Iron-Vitamin C (VITRON-C) 65-125 MG TABS Take 1 tablet by mouth 2 (two) times a day.    . lactulose (CHRONULAC) 10 GM/15ML solution Take 10 g by mouth daily as needed for mild  constipation.     Marland Kitchen levothyroxine (SYNTHROID, LEVOTHROID) 200 MCG tablet Take 200 mcg by mouth daily.  8  . meloxicam (MOBIC) 7.5 MG tablet Take 7.5 mg by mouth 2 (two) times a day.    . nitroGLYCERIN (NITROLINGUAL) 0.4 MG/SPRAY spray Place 1 spray under the tongue every 5 (five) minutes x 3 doses as needed for chest pain.    Marland Kitchen oxyCODONE-acetaminophen (PERCOCET/ROXICET) 5-325 MG tablet Take 1 tablet by mouth 4 (four) times daily.     . pantoprazole (PROTONIX) 40 MG tablet Take 40 mg by mouth daily.    Marland Kitchen spironolactone (ALDACTONE) 50 MG tablet Take 50 mg by mouth daily.  5  . venlafaxine XR (EFFEXOR-XR) 75 MG 24 hr capsule Take 75 mg by mouth daily.   4  . [employer]S VITAMIN B12 1000 MCG tablet TAKE 1 TABLET BY MOUTH EVERY DAY 90 tablet 1   No current facility-administered medications for this visit.      Physical Exam: Blood pressure 120/85, pulse 87, temperature 98.2 F (36.8 C), temperature source  Tympanic, weight 207 lb (93.9 kg). Physical Exam  Constitutional: She is oriented to person, place, and time. No distress.  HENT:  Head: Normocephalic and atraumatic.  Nose: Nose normal.  Mouth/Throat: Oropharynx is clear and moist. No oropharyngeal exudate.  Chronic right frontal mass.  Eyes: Pupils are equal, round, and reactive to light. EOM are normal. No scleral icterus.  Neck: Normal range of motion. Neck supple.  Cardiovascular: Normal rate and regular rhythm.  No murmur heard. Pulmonary/Chest: Effort normal and breath sounds normal. No respiratory distress. She has no rales. She exhibits no tenderness.  Abdominal: Soft. Bowel sounds are normal. She exhibits no distension. There is no abdominal tenderness.  Splenomegaly  Musculoskeletal: Normal range of motion.        General: No edema.  Neurological: She is alert and oriented to person, place, and time. No cranial nerve deficit. She exhibits normal muscle tone. Coordination normal.  Skin: Skin is warm and dry. She is not diaphoretic. No erythema.  Psychiatric: Affect normal.    Appointment on 08/14/2019  Component Date Value Ref Range Status  . Iron 08/14/2019 139  28 - 170 ug/dL Final  . TIBC 08/14/2019 409  250 - 450 ug/dL Final  . Saturation Ratios 08/14/2019 34* 10.4 - 31.8 % Final  . UIBC 08/14/2019 270  ug/dL Final   Performed at [facility], [address], [place], [place]  [address]  . Ferritin 08/14/2019 26  11 - 307 ng/mL Final   Performed at [facility], [address] [place], [place] [address]  . Vitamin B-12 08/14/2019 239  180 - 914 pg/mL Final   Comment: (NOTE) This assay is not validated for testing neonatal or myeloproliferative syndrome specimens for Vitamin B12 levels. Performed at [facility], [place] [address], [place], [place] [address]   . WBC 08/14/2019 1.9* 4.0 - 10.5 K/uL Final  . RBC 08/14/2019 4.08  3.87 - 5.11 MIL/uL Final  . Hemoglobin 08/14/2019 12.8  12.0 -  15.0 g/dL Final  . HCT 08/14/2019 38.3  36.0 - 46.0 % Final  . MCV 08/14/2019 93.9  80.0 - 100.0 fL Final  . MCH 08/14/2019 31.4  26.0 - 34.0 pg Final  . MCHC 08/14/2019 33.4  30.0 - 36.0 g/dL Final  . RDW 08/14/2019 13.2  11.5 - 15.5 % Final  . Platelets 08/14/2019 37* 150 - 400 K/uL Final   Comment: Immature Platelet Fraction may be clinically  indicated, consider ordering this additional test [id number]   . nRBC 08/14/2019 0.0  0.0 - 0.2 % Final  . Neutrophils Relative % 08/14/2019 59  % Final  . Neutro Abs 08/14/2019 1.1* 1.7 - 7.7 K/uL Final  . Lymphocytes Relative 08/14/2019 29  % Final  . Lymphs Abs 08/14/2019 0.6* 0.7 - 4.0 K/uL Final  . Monocytes Relative 08/14/2019 10  % Final  . Monocytes Absolute 08/14/2019 0.2  0.1 - 1.0 K/uL Final  . Eosinophils Relative 08/14/2019 1  % Final  . Eosinophils Absolute 08/14/2019 0.0  0.0 - 0.5 K/uL Final  . Basophils Relative 08/14/2019 1  % Final  . Basophils Absolute 08/14/2019 0.0  0.0 - 0.1 K/uL Final  . Immature Granulocytes 08/14/2019 0  % Final  . Abs Immature Granulocytes 08/14/2019 0.00  0.00 - 0.07 K/uL Final   Performed at [facility], [address] [place], [place] [address]    RADIOGRAPHIC STUDIES: I have personally reviewed the radiological images as listed and agreed with the findings in the report. Ct Abdomen Pelvis W Contrast  Result Date: 06/24/2019 CLINICAL DATA:  Pt reports she was laying on the couch around 1030 pm when she became dizzy, developed n/v and epigastric pain. Reports she spit up about 3 times. The first time she reports it was a little bloody looking. Denies shortness of breath or diaphoresis with the painHx: tubal ligation, c-section. EXAM: CT ABDOMEN AND PELVIS WITH CONTRAST TECHNIQUE: Multidetector CT imaging of the abdomen and pelvis was performed using the standard protocol following bolus administration of intravenous contrast. CONTRAST:  161mL OMNIPAQUE IOHEXOL 300 MG/ML  SOLN COMPARISON:   07/14/2018 FINDINGS: Lower chest: Clear lung bases.  Heart normal in size. Hepatobiliary: Liver shows central volume loss and surface nodularity consistent with cirrhosis. No liver mass or focal lesion. Normal gallbladder. No bile duct dilation. Pancreas: Unremarkable. No pancreatic ductal dilatation or surrounding inflammatory changes. Spleen: Enlarged spleen measuring 18.9 x 6.7 x 14.4 cm. No splenic mass or focal lesion. Adrenals/Urinary Tract: Adrenal glands are unremarkable. Kidneys are normal, without renal calculi, focal lesion, or hydronephrosis. Bladder is unremarkable. Stomach/Bowel: Normal stomach. Small bowel and colon are normal in caliber. No wall thickening or inflammation. No evidence of appendicitis. Vascular/Lymphatic: There are prominent upper abdominal venous collaterals, extending along the anterior surface of the liver to the umbilicus, and along the coronary vein to merge with para softened she will varices. These findings are stable from the prior CT. Mild aortic atherosclerosis. No other vascular abnormality. No pathologically enlarged lymph nodes. Reproductive: Uterus and bilateral adnexa are unremarkable. Other: No abdominal wall hernia or abnormality. No abdominopelvic ascites. Musculoskeletal: No fracture or acute finding. No osteoblastic or osteolytic lesions. IMPRESSION: 1. No acute findings. 2. Cirrhosis with portal venous hypertension reflected by splenomegaly and venous collaterals, similar to the prior CT. No ascites on the current exam. 3. Mild aortic atherosclerosis. Electronically Signed   By: Lajean Manes M.D.   On: 06/24/2019 04:24    Assessment:  Brandi Bright is a 48 y.o. female with history of liver cirrhosis, hepatitis C, splenomegaly, portal hypertension, chronic pancytopenia present for follow-up.  1. Iron deficiency anemia, unspecified iron deficiency anemia type   2. Thrombocytopenia (HCC)   3. Splenomegaly   4. Leukopenia, unspecified type   5. Low serum  copper for age    #Leukopenia and also leukocytopenia secondary to chronic liver disease, cirrhosis, portal hypertension, splenomegaly. Counts are stable.  Continue to monitor.  #Iron deficiency anemia, Patient  has previously received IV iron treatments.  Labs are reviewed and discussed with patient. No need for additional IV iron treatments at this point. Hemoglobin has normalized. Continue to monitor.   She may continue to take oral vitamin C 1  tablet daily.  #Thrombocytopenia, platelet counts at 37,000 today.  No bleeding events.  This is secondary to splenomegaly. Continue to monitor.  #Leukopenia/.  Primarily lymphocytopenia and neutropenia likely secondary to chronic liver disease, cirrhosis, portal hypertension, splenomegaly, chronic hepatitis C infection.. Counts are stable.  ANC is 1.1.  Continue to monitor  #Copper deficiency, advised patient to continue take copper supplementation. #Recommend patient to continue take vitamin B12 supplementation. # Liver cirrhosis secondary to chronic hepatitis C, esophageal varices, splenomegaly Continue to follow-up with gastroenterology.  Per patient, she is about to start hepatitis C treatment.  Also is going to schedule variceal banding in the future.  Follow up in 3 months for repeat iron TIBC, ferritin, cbc and MD assessment  We spent sufficient time to discuss many aspect of care, questions were answered to patient's satisfaction.   Earlie Server, MD, PhD Hematology Oncology [facility] Pager- [phone] 08/15/2019

## 2019-08-16 ENCOUNTER — Encounter: Payer: Self-pay | Admitting: *Deleted

## 2019-08-24 ENCOUNTER — Other Ambulatory Visit: Payer: Self-pay | Admitting: Gastroenterology

## 2019-08-24 DIAGNOSIS — B182 Chronic viral hepatitis C: Secondary | ICD-10-CM

## 2019-08-24 DIAGNOSIS — K7469 Other cirrhosis of liver: Secondary | ICD-10-CM

## 2019-08-24 NOTE — Telephone Encounter (Signed)
Last office visit 06/28/19 Acute Hep C  Last refill 05/02/19 was a historical medication  Return in 8 Weeks  Has appointment 08/30/19

## 2019-08-30 ENCOUNTER — Telehealth: Payer: Self-pay

## 2019-08-30 ENCOUNTER — Ambulatory Visit (INDEPENDENT_AMBULATORY_CARE_PROVIDER_SITE_OTHER): Payer: Medicaid Other | Admitting: Gastroenterology

## 2019-08-30 ENCOUNTER — Other Ambulatory Visit: Payer: Self-pay

## 2019-08-30 ENCOUNTER — Encounter: Payer: Self-pay | Admitting: Gastroenterology

## 2019-08-30 VITALS — BP 135/89 | HR 94 | Temp 98.3°F | Wt 207.5 lb

## 2019-08-30 DIAGNOSIS — K7460 Unspecified cirrhosis of liver: Secondary | ICD-10-CM | POA: Diagnosis not present

## 2019-08-30 DIAGNOSIS — R350 Frequency of micturition: Secondary | ICD-10-CM

## 2019-08-30 DIAGNOSIS — R1310 Dysphagia, unspecified: Secondary | ICD-10-CM | POA: Diagnosis not present

## 2019-08-30 MED ORDER — SOFOSBUVIR-VELPATASVIR 400-100 MG PO TABS: 1.0000 | ORAL_TABLET | Freq: Every day | ORAL | 2 refills | Status: DC

## 2019-08-30 MED ORDER — RIFAXIMIN 550 MG PO TABS: 550.0000 mg | ORAL_TABLET | Freq: Two times a day (BID) | ORAL | 0 refills | Status: AC

## 2019-08-30 MED ORDER — FAMOTIDINE 20 MG PO TABS: 20.0000 mg | ORAL_TABLET | Freq: Every day | ORAL | 0 refills | Status: DC

## 2019-08-30 MED ORDER — CARVEDILOL 6.25 MG PO TABS: 6.2500 mg | ORAL_TABLET | Freq: Every morning | ORAL | 1 refills | Status: DC

## 2019-08-30 NOTE — Telephone Encounter (Signed)
Patient is approved for Epclusa x 12 weeks for Hep C

## 2019-08-30 NOTE — Progress Notes (Signed)
Brandi Antigua, MD [address]  [place], [place] [address]  Main: [phone]  Fax: [phone]   Primary Care Physician: Cletis Athens, MD   Chief Complaint  Patient presents with  . Hepatitis C    Patient has had lower abdominal pain that is sharp and some diarrhea     HPI: Brandi Bright is a 48 y.o. female with history of cirrhosis and hepatitis C here for follow-up.  Patient states she received a call from the pharmacy and is having medications delivered to her for hepatitis C this week.  She thinks it is a close.  She states she was told it is 3 different prescriptions, each 1 being 28 days.    Denies any episodes of bleeding.  Does report that she feels confused at times but has not lost her way.  Takes lactulose 1 time a day and has 3 loose bowel movements daily.  Also reports mild bilateral lower quadrant and suprapubic pain,, 5/10, cramping, dull, nonradiating for 1 month, intermittently about once a week.  No changes in bowel movements.  Reports urinary frequency for 2 months, no burning, no dysuria, no hematuria.  Also reports of burning sensation in the stomach intermittently, but no heartburn.  Describes 1 month history of intermittent symptoms of dysphagia about once a week.  She was recently hospitalized in July 2020 with hematemesis and upper endoscopy showed known grade 2 esophageal varices without red wale sign.Dr. Vicente Males completed the procedure and recommended oral PPI.  Lowest hemoglobin during her admission was 11.3 which is around her baseline.  She was discharged on Protonix. Her Coreg was stopped due to bradycardia on that admission  Patient is taking lactulose every day.  Reports having a bowel movement every 2 to 3 days.  Is on iron replacement by Dr. Tasia Catchings.  Had a screening colonoscopy in April 2019 with less than 1 cm polyp removed and repeat recommended in 3 years.  Variceal screening November 2019. Patient was placed on Coreg  twice daily for primary prophylaxis.  Current Outpatient Medications  Medication Sig Dispense Refill  . CVS VITAMIN B12 1000 MCG tablet TAKE 1 TABLET BY MOUTH EVERY DAY 90 tablet 1  . diclofenac sodium (VOLTAREN) 1 % GEL Apply 5 g topically 4 (four) times daily.    . Iron-Vitamin C (VITRON-C) 65-125 MG TABS Take 1 tablet by mouth daily. 30 tablet 3  . lactulose (CHRONULAC) 10 GM/15ML solution TAKE 15 MLS (10 G TOTAL) BY MOUTH DAILY FOR 30 DAYS. 473 mL 3  . levothyroxine (SYNTHROID, LEVOTHROID) 200 MCG tablet Take 200 mcg by mouth daily.  8  . meloxicam (MOBIC) 7.5 MG tablet Take 7.5 mg by mouth 2 (two) times a day.    . nitroGLYCERIN (NITROLINGUAL) 0.4 MG/SPRAY spray Place 1 spray under the tongue every 5 (five) minutes x 3 doses as needed for chest pain.    Marland Kitchen oxyCODONE-acetaminophen (PERCOCET/ROXICET) 5-325 MG tablet Take 1 tablet by mouth 4 (four) times daily.     Marland Kitchen spironolactone (ALDACTONE) 50 MG tablet Take 50 mg by mouth daily.  5  . venlafaxine XR (EFFEXOR-XR) 75 MG 24 hr capsule Take 75 mg by mouth daily.   4  . carvedilol (COREG) 6.25 MG tablet Take 1 tablet (6.25 mg total) by mouth every morning. 30 tablet 1  . famotidine (PEPCID) 20 MG tablet Take 1 tablet (20 mg total) by mouth daily. 30 tablet 0  . rifaximin (XIFAXAN) 550 MG TABS tablet Take  1 tablet (550 mg total) by mouth 2 (two) times daily. 120 tablet 0   No current facility-administered medications for this visit.     Allergies as of 08/30/2019 - Review Complete 08/30/2019  Allergen Reaction Noted  . Vicodin [hydrocodone-acetaminophen] Rash 01/11/2016  . Amoxicillin Rash and Other (See Comments) 01/11/2016  . Gabapentin Rash 01/11/2016    ROS:  General: Negative for anorexia, weight loss, fever, chills, fatigue, weakness. ENT: Negative for hoarseness, difficulty swallowing , nasal congestion. CV: Negative for chest pain, angina, palpitations, dyspnea on exertion, peripheral edema.  Respiratory: Negative for dyspnea  at rest, dyspnea on exertion, cough, sputum, wheezing.  GI: See history of present illness. GU:  Negative for dysuria, hematuria, urinary incontinence, urinary frequency, nocturnal urination.  Endo: Negative for unusual weight change.    Physical Examination:   BP 135/89 (BP Location: Left Arm, Patient Position: Sitting, Cuff Size: Normal)   Pulse 94   Temp 98.3 F (36.8 C) (Oral)   Wt 207 lb 8 oz (94.1 kg)   LMP  (LMP Unknown)   BMI 41.91 kg/m   General: Well-nourished, well-developed in no acute distress.  Eyes: No icterus. Conjunctivae pink. Mouth: Oropharyngeal mucosa moist and pink , no lesions erythema or exudate. Neck: Supple, Trachea midline Abdomen: Bowel sounds are normal, nontender, nondistended, no hepatosplenomegaly or masses, no abdominal bruits or hernia , no rebound or guarding.   Extremities: No lower extremity edema. No clubbing or deformities. Neuro: Alert and oriented x 3.  Grossly intact. Skin: Warm and dry, no jaundice.   Psych: Alert and cooperative, normal mood and affect.   Labs: CMP     Component Value Date/Time   NA CANCELED 06/28/2019 1515   NA 137 08/25/2013 0617   K CANCELED 06/28/2019 1515   K 4.0 08/25/2013 0617   CL CANCELED 06/28/2019 1515   CL 104 08/25/2013 0617   CO2 CANCELED 06/28/2019 1515   CO2 29 08/25/2013 0617   GLUCOSE CANCELED 06/28/2019 1515   GLUCOSE 94 06/25/2019 0419   GLUCOSE 97 08/25/2013 0617   BUN CANCELED 06/28/2019 1515   BUN 9 08/25/2013 0617   CREATININE CANCELED 06/28/2019 1515   CREATININE 0.68 08/25/2013 0617   CALCIUM CANCELED 06/28/2019 1515   CALCIUM 8.8 08/25/2013 0617   PROT CANCELED 06/28/2019 1515   PROT 8.8 (H) 05/30/2013 1242   ALBUMIN CANCELED 06/28/2019 1515   ALBUMIN 4.4 05/30/2013 1242   AST CANCELED 06/28/2019 1515   AST 163 (H) 05/30/2013 1242   ALT CANCELED 06/28/2019 1515   ALT 144 (H) 05/30/2013 1242   ALKPHOS CANCELED 06/28/2019 1515   ALKPHOS 89 05/30/2013 1242   BILITOT CANCELED  06/28/2019 1515   BILITOT 0.8 05/30/2013 1242   GFRNONAA CANCELED 06/28/2019 1515   GFRNONAA >60 08/25/2013 0617   GFRAA CANCELED 06/28/2019 1515   GFRAA >60 08/25/2013 0617   Lab Results  Component Value Date   WBC 1.9 (L) 08/14/2019   HGB 12.8 08/14/2019   HCT 38.3 08/14/2019   MCV 93.9 08/14/2019   PLT 37 (L) 08/14/2019    Imaging Studies: No results found.  Assessment and Plan:   Brandi Bright is a 48 y.o. y/o female with history of hep C, treatment nave, cirrhosis, here for follow-up  Hep C treatment approved and patient is awaiting for the medication to be delivered to her home  lower quadrant pain may be related to her urinary frequency.  Will order UA.  Discontinue Protonix as no ulcers or lesions seen on her  upper endoscopy in July 2020.  Will change to Pepcid instead due to her symptoms of "burning in stomach".  She describes of dysphagia over the last month but EGD on last admission did not show any narrowing.  Will order esophagram.  Due to her intermittent confusion and lactulose already getting her 2-3 loose stools a day, will order rifaximin.  We will start low-dose Coreg since pulse is now in the 90s and blood pressure can tolerate the medication.  Follow-up in 4 to 6 weeks  Dr Brandi Bright

## 2019-08-30 NOTE — H&P (View-Only) (Signed)
[name], MD [address]  [place]  [place], [place] [address]  Main: [phone]  Fax: [phone]   Primary Care Physician: Cletis Athens, MD   Chief Complaint  Patient presents with  . Hepatitis C    Patient has had lower abdominal pain that is sharp and some diarrhea     HPI: Brandi Bright is a 48 y.o. female with history of cirrhosis and hepatitis C here for follow-up.  Patient states she received a call from the pharmacy and is having medications delivered to her for hepatitis C this week.  She thinks it is a close.  She states she was told it is 3 different prescriptions, each 1 being 28 days.    Denies any episodes of bleeding.  Does report that she feels confused at times but has not lost her way.  Takes lactulose 1 time a day and has 3 loose bowel movements daily.  Also reports mild bilateral lower quadrant and suprapubic pain,, 5/10, cramping, dull, nonradiating for 1 month, intermittently about once a week.  No changes in bowel movements.  Reports urinary frequency for 2 months, no burning, no dysuria, no hematuria.  Also reports of burning sensation in the stomach intermittently, but no heartburn.  Describes 1 month history of intermittent symptoms of dysphagia about once a week.  She was recently hospitalized in July 2020 with hematemesis and upper endoscopy showed known grade 2 esophageal varices without red wale sign.Dr. Vicente Males completed the procedure and recommended oral PPI.  Lowest hemoglobin during her admission was 11.3 which is around her baseline.  She was discharged on Protonix. Her Coreg was stopped due to bradycardia on that admission  Patient is taking lactulose every day.  Reports having a bowel movement every 2 to 3 days.  Is on iron replacement by Dr. Tasia Catchings.  Had a screening colonoscopy in April 2019 with less than 1 cm polyp removed and repeat recommended in 3 years.  Variceal screening November 2019. Patient was placed on Coreg  twice daily for primary prophylaxis.  Current Outpatient Medications  Medication Sig Dispense Refill  . [employer] VITAMIN B12 1000 MCG tablet TAKE 1 TABLET BY MOUTH EVERY DAY 90 tablet 1  . diclofenac sodium (VOLTAREN) 1 % GEL Apply 5 g topically 4 (four) times daily.    . Iron-Vitamin C (VITRON-C) 65-125 MG TABS Take 1 tablet by mouth daily. 30 tablet 3  . lactulose (CHRONULAC) 10 GM/15ML solution TAKE 15 MLS (10 G TOTAL) BY MOUTH DAILY FOR 30 DAYS. 473 mL 3  . levothyroxine (SYNTHROID, LEVOTHROID) 200 MCG tablet Take 200 mcg by mouth daily.  8  . meloxicam (MOBIC) 7.5 MG tablet Take 7.5 mg by mouth 2 (two) times a day.    . nitroGLYCERIN (NITROLINGUAL) 0.4 MG/SPRAY spray Place 1 spray under the tongue every 5 (five) minutes x 3 doses as needed for chest pain.    Marland Kitchen oxyCODONE-acetaminophen (PERCOCET/ROXICET) 5-325 MG tablet Take 1 tablet by mouth 4 (four) times daily.     Marland Kitchen spironolactone (ALDACTONE) 50 MG tablet Take 50 mg by mouth daily.  5  . venlafaxine XR (EFFEXOR-XR) 75 MG 24 hr capsule Take 75 mg by mouth daily.   4  . carvedilol (COREG) 6.25 MG tablet Take 1 tablet (6.25 mg total) by mouth every morning. 30 tablet 1  . famotidine (PEPCID) 20 MG tablet Take 1 tablet (20 mg total) by mouth daily. 30 tablet 0  . rifaximin (XIFAXAN) 550 MG TABS tablet Take  1 tablet (550 mg total) by mouth 2 (two) times daily. 120 tablet 0   No current facility-administered medications for this visit.     Allergies as of 08/30/2019 - Review Complete 08/30/2019  Allergen Reaction Noted  . Vicodin [hydrocodone-acetaminophen] Rash 01/11/2016  . Amoxicillin Rash and Other (See Comments) 01/11/2016  . Gabapentin Rash 01/11/2016    ROS:  General: Negative for anorexia, weight loss, fever, chills, fatigue, weakness. ENT: Negative for hoarseness, difficulty swallowing , nasal congestion. CV: Negative for chest pain, angina, palpitations, dyspnea on exertion, peripheral edema.  Respiratory: Negative for dyspnea  at rest, dyspnea on exertion, cough, sputum, wheezing.  GI: See history of present illness. GU:  Negative for dysuria, hematuria, urinary incontinence, urinary frequency, nocturnal urination.  Endo: Negative for unusual weight change.    Physical Examination:   BP 135/89 (BP Location: Left Arm, Patient Position: Sitting, Cuff Size: Normal)   Pulse 94   Temp 98.3 F (36.8 C) (Oral)   Wt 207 lb 8 oz (94.1 kg)   LMP  (LMP Unknown)   BMI 41.91 kg/m   General: Well-nourished, well-developed in no acute distress.  Eyes: No icterus. Conjunctivae pink. Mouth: Oropharyngeal mucosa moist and pink , no lesions erythema or exudate. Neck: Supple, Trachea midline Abdomen: Bowel sounds are normal, nontender, nondistended, no hepatosplenomegaly or masses, no abdominal bruits or hernia , no rebound or guarding.   Extremities: No lower extremity edema. No clubbing or deformities. Neuro: Alert and oriented x 3.  Grossly intact. Skin: Warm and dry, no jaundice.   Psych: Alert and cooperative, normal mood and affect.   Labs: CMP     Component Value Date/Time   NA CANCELED 06/28/2019 1515   NA 137 08/25/2013 0617   K CANCELED 06/28/2019 1515   K 4.0 08/25/2013 0617   CL CANCELED 06/28/2019 1515   CL 104 08/25/2013 0617   CO2 CANCELED 06/28/2019 1515   CO2 29 08/25/2013 0617   GLUCOSE CANCELED 06/28/2019 1515   GLUCOSE 94 06/25/2019 0419   GLUCOSE 97 08/25/2013 0617   BUN CANCELED 06/28/2019 1515   BUN 9 08/25/2013 0617   CREATININE CANCELED 06/28/2019 1515   CREATININE 0.68 08/25/2013 0617   CALCIUM CANCELED 06/28/2019 1515   CALCIUM 8.8 08/25/2013 0617   PROT CANCELED 06/28/2019 1515   PROT 8.8 (H) 05/30/2013 1242   ALBUMIN CANCELED 06/28/2019 1515   ALBUMIN 4.4 05/30/2013 1242   AST CANCELED 06/28/2019 1515   AST 163 (H) 05/30/2013 1242   ALT CANCELED 06/28/2019 1515   ALT 144 (H) 05/30/2013 1242   ALKPHOS CANCELED 06/28/2019 1515   ALKPHOS 89 05/30/2013 1242   BILITOT CANCELED  06/28/2019 1515   BILITOT 0.8 05/30/2013 1242   GFRNONAA CANCELED 06/28/2019 1515   GFRNONAA >60 08/25/2013 0617   GFRAA CANCELED 06/28/2019 1515   GFRAA >60 08/25/2013 0617   Lab Results  Component Value Date   WBC 1.9 (L) 08/14/2019   HGB 12.8 08/14/2019   HCT 38.3 08/14/2019   MCV 93.9 08/14/2019   PLT 37 (L) 08/14/2019    Imaging Studies: No results found.  Assessment and Plan:   Brandi Bright is a 48 y.o. y/o female with history of hep C, treatment nave, cirrhosis, here for follow-up  Hep C treatment approved and patient is awaiting for the medication to be delivered to her home  lower quadrant pain may be related to her urinary frequency.  Will order UA.  Discontinue Protonix as no ulcers or lesions seen on her  upper endoscopy in July 2020.  Will change to Pepcid instead due to her symptoms of "burning in stomach".  She describes of dysphagia over the last month but EGD on last admission did not show any narrowing.  Will order esophagram.  Due to her intermittent confusion and lactulose already getting her 2-3 loose stools a day, will order rifaximin.  We will start low-dose Coreg since pulse is now in the 90s and blood pressure can tolerate the medication.  Follow-up in 4 to 6 weeks  Dr [name]

## 2019-08-30 NOTE — Patient Instructions (Signed)
Be at the [facility] on 09/04/2019 at 9:45. Nothing to eat or drink 3 hours before the procedure

## 2019-09-02 LAB — MICROSCOPIC EXAMINATION: Casts: NONE SEEN /lpf

## 2019-09-02 LAB — UA/M W/RFLX CULTURE, COMP
Bilirubin, UA: NEGATIVE
Glucose, UA: NEGATIVE
Ketones, UA: NEGATIVE
Nitrite, UA: NEGATIVE
Protein,UA: NEGATIVE
RBC, UA: NEGATIVE
Specific Gravity, UA: 1.019 (ref 1.005–1.030)
Urobilinogen, Ur: 1 mg/dL (ref 0.2–1.0)
pH, UA: 5.5 (ref 5.0–7.5)

## 2019-09-02 LAB — URINE CULTURE, COMPREHENSIVE

## 2019-09-04 ENCOUNTER — Ambulatory Visit
Admission: RE | Admit: 2019-09-04 | Discharge: 2019-09-04 | Disposition: A | Discharge status: Home / Self Care | Payer: Medicaid Other | Source: Ambulatory Visit | Attending: Gastroenterology | Admitting: Gastroenterology

## 2019-09-04 ENCOUNTER — Other Ambulatory Visit: Payer: Self-pay

## 2019-09-04 ENCOUNTER — Other Ambulatory Visit: Payer: Self-pay | Admitting: Gastroenterology

## 2019-09-04 DIAGNOSIS — R1310 Dysphagia, unspecified: Secondary | ICD-10-CM | POA: Diagnosis not present

## 2019-09-05 ENCOUNTER — Other Ambulatory Visit: Payer: Self-pay

## 2019-09-05 ENCOUNTER — Telehealth: Payer: Self-pay

## 2019-09-05 DIAGNOSIS — R1310 Dysphagia, unspecified: Secondary | ICD-10-CM

## 2019-09-05 NOTE — Telephone Encounter (Signed)
-----   Message from Virgel Manifold, MD sent at 09/05/2019  1:00 PM EDT ----- Your esophagram showed a mild narrowing in the distal esophagus. I recommend an EGD for evaluation. Brandi Bright please schedule for dysphagia

## 2019-09-05 NOTE — Telephone Encounter (Signed)
Patient verbalized understanding. Went over directions with patient and sent instructions to mychart. Informed patient to go for COVID test  On Friday.

## 2019-09-07 DIAGNOSIS — I208 Other forms of angina pectoris: Secondary | ICD-10-CM | POA: Diagnosis not present

## 2019-09-07 DIAGNOSIS — D509 Iron deficiency anemia, unspecified: Secondary | ICD-10-CM | POA: Diagnosis not present

## 2019-09-07 DIAGNOSIS — I252 Old myocardial infarction: Secondary | ICD-10-CM | POA: Diagnosis not present

## 2019-09-07 DIAGNOSIS — E8881 Metabolic syndrome: Secondary | ICD-10-CM | POA: Diagnosis not present

## 2019-09-08 ENCOUNTER — Other Ambulatory Visit
Admission: RE | Admit: 2019-09-08 | Discharge: 2019-09-08 | Disposition: A | Discharge status: Home / Self Care | Payer: Medicaid Other | Source: Ambulatory Visit | Attending: Gastroenterology | Admitting: Gastroenterology

## 2019-09-08 DIAGNOSIS — Z01812 Encounter for preprocedural laboratory examination: Secondary | ICD-10-CM | POA: Diagnosis not present

## 2019-09-08 DIAGNOSIS — Z20828 Contact with and (suspected) exposure to other viral communicable diseases: Secondary | ICD-10-CM | POA: Diagnosis not present

## 2019-09-09 LAB — SARS CORONAVIRUS 2 (TAT 6-24 HRS): SARS Coronavirus 2: NEGATIVE

## 2019-09-11 DIAGNOSIS — D509 Iron deficiency anemia, unspecified: Secondary | ICD-10-CM | POA: Diagnosis not present

## 2019-09-11 DIAGNOSIS — I517 Cardiomegaly: Secondary | ICD-10-CM | POA: Diagnosis not present

## 2019-09-11 DIAGNOSIS — I252 Old myocardial infarction: Secondary | ICD-10-CM | POA: Diagnosis not present

## 2019-09-11 DIAGNOSIS — R1310 Dysphagia, unspecified: Secondary | ICD-10-CM | POA: Diagnosis not present

## 2019-09-11 DIAGNOSIS — J3089 Other allergic rhinitis: Secondary | ICD-10-CM | POA: Diagnosis not present

## 2019-09-12 ENCOUNTER — Ambulatory Visit: Payer: Medicaid Other | Admitting: Anesthesiology

## 2019-09-12 ENCOUNTER — Encounter: Payer: Self-pay | Admitting: Anesthesiology

## 2019-09-12 ENCOUNTER — Ambulatory Visit
Admission: RE | Admit: 2019-09-12 | Discharge: 2019-09-12 | Disposition: A | Discharge status: Home / Self Care | Payer: Medicaid Other | Attending: Gastroenterology | Admitting: Gastroenterology

## 2019-09-12 ENCOUNTER — Encounter
Admission: RE | Disposition: A | Discharge status: Home / Self Care | Payer: Self-pay | Source: Home / Self Care | Attending: Gastroenterology

## 2019-09-12 DIAGNOSIS — R350 Frequency of micturition: Secondary | ICD-10-CM | POA: Insufficient documentation

## 2019-09-12 DIAGNOSIS — I8500 Esophageal varices without bleeding: Secondary | ICD-10-CM | POA: Insufficient documentation

## 2019-09-12 DIAGNOSIS — I10 Essential (primary) hypertension: Secondary | ICD-10-CM | POA: Diagnosis not present

## 2019-09-12 DIAGNOSIS — I252 Old myocardial infarction: Secondary | ICD-10-CM | POA: Diagnosis not present

## 2019-09-12 DIAGNOSIS — B1920 Unspecified viral hepatitis C without hepatic coma: Secondary | ICD-10-CM | POA: Diagnosis not present

## 2019-09-12 DIAGNOSIS — K922 Gastrointestinal hemorrhage, unspecified: Secondary | ICD-10-CM | POA: Diagnosis not present

## 2019-09-12 DIAGNOSIS — Z791 Long term (current) use of non-steroidal anti-inflammatories (NSAID): Secondary | ICD-10-CM | POA: Diagnosis not present

## 2019-09-12 DIAGNOSIS — R1310 Dysphagia, unspecified: Secondary | ICD-10-CM | POA: Diagnosis not present

## 2019-09-12 DIAGNOSIS — D696 Thrombocytopenia, unspecified: Secondary | ICD-10-CM | POA: Diagnosis not present

## 2019-09-12 DIAGNOSIS — K7460 Unspecified cirrhosis of liver: Secondary | ICD-10-CM | POA: Diagnosis not present

## 2019-09-12 DIAGNOSIS — I2510 Atherosclerotic heart disease of native coronary artery without angina pectoris: Secondary | ICD-10-CM | POA: Diagnosis not present

## 2019-09-12 DIAGNOSIS — D649 Anemia, unspecified: Secondary | ICD-10-CM | POA: Diagnosis not present

## 2019-09-12 DIAGNOSIS — R410 Disorientation, unspecified: Secondary | ICD-10-CM | POA: Insufficient documentation

## 2019-09-12 DIAGNOSIS — R079 Chest pain, unspecified: Secondary | ICD-10-CM | POA: Diagnosis not present

## 2019-09-12 DIAGNOSIS — Z79899 Other long term (current) drug therapy: Secondary | ICD-10-CM | POA: Diagnosis not present

## 2019-09-12 DIAGNOSIS — E039 Hypothyroidism, unspecified: Secondary | ICD-10-CM | POA: Insufficient documentation

## 2019-09-12 DIAGNOSIS — M1990 Unspecified osteoarthritis, unspecified site: Secondary | ICD-10-CM | POA: Diagnosis not present

## 2019-09-12 DIAGNOSIS — K2970 Gastritis, unspecified, without bleeding: Secondary | ICD-10-CM | POA: Diagnosis not present

## 2019-09-12 DIAGNOSIS — E785 Hyperlipidemia, unspecified: Secondary | ICD-10-CM | POA: Diagnosis not present

## 2019-09-12 DIAGNOSIS — F418 Other specified anxiety disorders: Secondary | ICD-10-CM | POA: Diagnosis not present

## 2019-09-12 HISTORY — PX: ESOPHAGOGASTRODUODENOSCOPY (EGD) WITH PROPOFOL: SHX5813

## 2019-09-12 SURGERY — ESOPHAGOGASTRODUODENOSCOPY (EGD) WITH PROPOFOL
Anesthesia: General

## 2019-09-12 MED ORDER — FENTANYL CITRATE (PF) 100 MCG/2ML IJ SOLN
12.5000 ug | Freq: Once | INTRAMUSCULAR | Status: AC
  Administered 2019-09-12: 10:00:00 12.5 ug via INTRAVENOUS

## 2019-09-12 MED ORDER — PHENYLEPHRINE HCL (PRESSORS) 10 MG/ML IV SOLN
INTRAVENOUS | Status: DC | PRN
  Administered 2019-09-12: 100 ug via INTRAVENOUS

## 2019-09-12 MED ORDER — PROPOFOL 500 MG/50ML IV EMUL
INTRAVENOUS | Status: DC | PRN
  Administered 2019-09-12: 175 ug/kg/min via INTRAVENOUS

## 2019-09-12 MED ORDER — FENTANYL CITRATE (PF) 100 MCG/2ML IJ SOLN
INTRAMUSCULAR | Status: AC
  Administered 2019-09-12: 12.5 ug via INTRAVENOUS
  Filled 2019-09-12: qty 2

## 2019-09-12 MED ORDER — GLYCOPYRROLATE 0.2 MG/ML IJ SOLN
INTRAMUSCULAR | Status: DC | PRN
  Administered 2019-09-12: 0.2 mg via INTRAVENOUS

## 2019-09-12 MED ORDER — PROPOFOL 10 MG/ML IV BOLUS
INTRAVENOUS | Status: DC | PRN
  Administered 2019-09-12: 20 mg via INTRAVENOUS
  Administered 2019-09-12: 50 mg via INTRAVENOUS
  Administered 2019-09-12: 20 mg via INTRAVENOUS

## 2019-09-12 MED ORDER — LIDOCAINE HCL (CARDIAC) PF 100 MG/5ML IV SOSY
PREFILLED_SYRINGE | INTRAVENOUS | Status: DC | PRN
  Administered 2019-09-12: 100 mg via INTRATRACHEAL

## 2019-09-12 MED ORDER — FENTANYL CITRATE (PF) 100 MCG/2ML IJ SOLN
25.0000 ug | Freq: Once | INTRAMUSCULAR | Status: AC
  Administered 2019-09-12: 25 ug via INTRAVENOUS

## 2019-09-12 MED ORDER — LIDOCAINE HCL (PF) 1 % IJ SOLN
INTRAMUSCULAR | Status: AC
  Filled 2019-09-12: qty 2

## 2019-09-12 MED ORDER — SODIUM CHLORIDE 0.9 % IV SOLN
INTRAVENOUS | Status: DC
  Administered 2019-09-12: 1000 mL via INTRAVENOUS

## 2019-09-12 NOTE — Transfer of Care (Addendum)
Immediate Anesthesia Transfer of Care Note  Patient: Brandi Bright  Procedure(s) Performed: ESOPHAGOGASTRODUODENOSCOPY (EGD) WITH PROPOFOL (N/A )  Patient Location: Endoscopy Unit  Anesthesia Type:General  Level of Consciousness: awake, alert , sedated and patient cooperative  Airway & Oxygen Therapy: Patient Spontanous Breathing and Patient connected to face mask oxygen  Post-op Assessment: Report given to RN and Post -op Vital signs reviewed and stable  Post vital signs: Reviewed and stable  Last Vitals:  Vitals Value Taken Time  BP 142/88 09/12/19 1006  Temp    Pulse 87 09/12/19 1008  Resp 14 09/12/19 1008  SpO2 100 % 09/12/19 1008  Vitals shown include unvalidated device data.  Last Pain:  Vitals:   09/12/19 1000  TempSrc: Tympanic  PainSc:          Complications: No apparent anesthesia complications

## 2019-09-12 NOTE — Anesthesia Postprocedure Evaluation (Signed)
Anesthesia Post Note  Patient: ANISTYN WENDLANDT  Procedure(s) Performed: ESOPHAGOGASTRODUODENOSCOPY (EGD) WITH PROPOFOL (N/A )  Patient location during evaluation: Endoscopy Anesthesia Type: General Level of consciousness: awake and alert Pain management: pain level controlled Vital Signs Assessment: post-procedure vital signs reviewed and stable Respiratory status: spontaneous breathing, nonlabored ventilation, respiratory function stable and patient connected to nasal cannula oxygen Cardiovascular status: blood pressure returned to baseline and stable Postop Assessment: no apparent nausea or vomiting Anesthetic complications: no     Last Vitals:  Vitals:   09/12/19 1040 09/12/19 1050  BP: 140/88 132/77  Pulse: 70   Resp: 18   Temp:    SpO2: 100%     Last Pain:  Vitals:   09/12/19 1000  TempSrc: Tympanic  PainSc:                  Quiara Killian S

## 2019-09-12 NOTE — OR Nursing (Signed)
C/o upper abdominal pain dr Allen Norris checked pt. fentayl x2 admininistered with relief.

## 2019-09-12 NOTE — Interval H&P Note (Signed)
History and Physical Interval Note:  09/12/2019 9:40 AM  Brandi Bright  has presented today for surgery, with the diagnosis of dysphagia R13.10.  The various methods of treatment have been discussed with the patient and family. After consideration of risks, benefits and other options for treatment, the patient has consented to  Procedure(s): ESOPHAGOGASTRODUODENOSCOPY (EGD) WITH PROPOFOL (N/A) as a surgical intervention.  The patient's history has been reviewed, patient examined, no change in status, stable for surgery.  I have reviewed the patient's chart and labs.  Questions were answered to the patient's satisfaction.     Doug Bucklin [employer]

## 2019-09-12 NOTE — Anesthesia Post-op Follow-up Note (Signed)
Anesthesia QCDR form completed.        

## 2019-09-12 NOTE — Op Note (Signed)
[facility] Gastroenterology Patient Name: Brandi Bright Procedure Date: 09/12/2019 9:46 AM MRN: [id number] Account #: [id number] Date of Birth: 04/06/1971 Admit Type: Outpatient Age: 48 Room: [facility] ENDO ROOM 4 Gender: Female Note Status: Finalized Procedure:            Upper GI endoscopy Indications:          Dysphagia Providers:            Lucilla Lame MD, MD Referring MD:         Cletis Athens, MD (Referring MD) Medicines:            Propofol per Anesthesia Complications:        No immediate complications. Procedure:            Pre-Anesthesia Assessment:                       - Prior to the procedure, a History and Physical was                        performed, and patient medications and allergies were                        reviewed. The patient's tolerance of previous                        anesthesia was also reviewed. The risks and benefits of                        the procedure and the sedation options and risks were                        discussed with the patient. All questions were                        answered, and informed consent was obtained. Prior                        Anticoagulants: The patient has taken no previous                        anticoagulant or antiplatelet agents. ASA Grade                        Assessment: III - A patient with severe systemic                        disease. After reviewing the risks and benefits, the                        patient was deemed in satisfactory condition to undergo                        the procedure.                       After obtaining informed consent, the endoscope was                        passed under direct vision. Throughout the procedure,  the patient's blood pressure, pulse, and oxygen                        saturations were monitored continuously. The Endoscope                        was introduced through the mouth, and advanced to the                        second  part of duodenum. The upper GI endoscopy was                        accomplished without difficulty. The patient tolerated                        the procedure well. Findings:      Grade III varices were found in the lower third of the esophagus. Four       bands were successfully placed with complete eradication, resulting in       deflation of varices. There was no bleeding during the procedure.      Diffuse severe inflammation characterized by erythema was found in the       gastric antrum.      A large amount of food (residue) was found in the entire examined       stomach.      The examined duodenum was normal. Impression:           - Grade III esophageal varices. Completely eradicated.                        Banded.                       - Gastritis.                       - A large amount of food (residue) in the stomach.                       - Normal examined duodenum.                       - No specimens collected. Recommendation:       - Discharge patient to home.                       - Mechanical soft diet for 4 days. Procedure Code(s):    --- Professional ---                       [phone], Esophagogastroduodenoscopy, flexible, transoral;                        with band ligation of esophageal/gastric varices Diagnosis Code(s):    --- Professional ---                       R13.10, Dysphagia, unspecified                       K29.70, Gastritis, unspecified, without bleeding                       I85.00,  Esophageal varices without bleeding CPT copyright 2019 American Medical Association. All rights reserved. The codes documented in this report are preliminary and upon coder review may  be revised to meet current compliance requirements. Lucilla Lame MD, MD 09/12/2019 10:03:36 AM This report has been signed electronically. Number of Addenda: 0 Note Initiated On: 09/12/2019 9:46 AM Estimated Blood Loss: Estimated blood loss: none.      [facility]

## 2019-09-12 NOTE — Anesthesia Preprocedure Evaluation (Signed)
Anesthesia Evaluation  Patient identified by MRN, date of birth, ID band Patient awake    Reviewed: Allergy & Precautions, NPO status , Patient's Chart, lab work & pertinent test results, reviewed documented beta blocker date and time   Airway Mallampati: III  TM Distance: >3 FB     Dental  (+) Chipped   Pulmonary           Cardiovascular hypertension, + CAD and + Past MI       Neuro/Psych    GI/Hepatic   Endo/Other  Hypothyroidism   Renal/GU      Musculoskeletal  (+) Arthritis ,   Abdominal   Peds  Hematology  (+) anemia ,   Anesthesia Other Findings MI in the remote past. No stents. EKG reviewed and ojk. ECHO 60-65. Does not take NTG anymore.  Reproductive/Obstetrics                             Anesthesia Physical Anesthesia Plan  ASA: III  Anesthesia Plan: General   Post-op Pain Management:    Induction: Intravenous  PONV Risk Score and Plan:   Airway Management Planned:   Additional Equipment:   Intra-op Plan:   Post-operative Plan:   Informed Consent: I have reviewed the patients History and Physical, chart, labs and discussed the procedure including the risks, benefits and alternatives for the proposed anesthesia with the patient or authorized representative who has indicated his/her understanding and acceptance.       Plan Discussed with: CRNA  Anesthesia Plan Comments:         Anesthesia Quick Evaluation

## 2019-09-13 ENCOUNTER — Encounter: Payer: Self-pay | Admitting: Gastroenterology

## 2019-09-18 DIAGNOSIS — Z5181 Encounter for therapeutic drug level monitoring: Secondary | ICD-10-CM | POA: Diagnosis not present

## 2019-09-18 DIAGNOSIS — M25512 Pain in left shoulder: Secondary | ICD-10-CM | POA: Diagnosis not present

## 2019-09-18 DIAGNOSIS — M542 Cervicalgia: Secondary | ICD-10-CM | POA: Diagnosis not present

## 2019-09-18 DIAGNOSIS — G894 Chronic pain syndrome: Secondary | ICD-10-CM | POA: Diagnosis not present

## 2019-09-18 DIAGNOSIS — Z79891 Long term (current) use of opiate analgesic: Secondary | ICD-10-CM | POA: Diagnosis not present

## 2019-09-18 DIAGNOSIS — M545 Low back pain: Secondary | ICD-10-CM | POA: Diagnosis not present

## 2019-10-01 ENCOUNTER — Other Ambulatory Visit: Payer: Self-pay | Admitting: Gastroenterology

## 2019-10-01 DIAGNOSIS — R1310 Dysphagia, unspecified: Secondary | ICD-10-CM

## 2019-10-02 NOTE — Telephone Encounter (Signed)
Last office visit  08/30/19 Dysphagia cirrhosis of liver, frequent urination  Last refill 08/30/2019 0 refills  Has appointment 10/12/19

## 2019-10-03 ENCOUNTER — Telehealth: Payer: Self-pay

## 2019-10-03 DIAGNOSIS — B1710 Acute hepatitis C without hepatic coma: Secondary | ICD-10-CM

## 2019-10-03 NOTE — Telephone Encounter (Signed)
[employer] is calling stating that patient needs to have a updated HCV RNA Quant. They state this needs to be faxed to them . There fax number is [phone]. Called patient to see if she can come get this lab work done. Patient states she will come have this done tomorrow.

## 2019-10-10 ENCOUNTER — Encounter: Payer: Self-pay | Admitting: Emergency Medicine

## 2019-10-10 ENCOUNTER — Other Ambulatory Visit: Payer: Self-pay

## 2019-10-10 DIAGNOSIS — Z79890 Hormone replacement therapy (postmenopausal): Secondary | ICD-10-CM

## 2019-10-10 DIAGNOSIS — F329 Major depressive disorder, single episode, unspecified: Secondary | ICD-10-CM | POA: Diagnosis present

## 2019-10-10 DIAGNOSIS — Z79899 Other long term (current) drug therapy: Secondary | ICD-10-CM

## 2019-10-10 DIAGNOSIS — B182 Chronic viral hepatitis C: Secondary | ICD-10-CM | POA: Diagnosis present

## 2019-10-10 DIAGNOSIS — Z20828 Contact with and (suspected) exposure to other viral communicable diseases: Secondary | ICD-10-CM | POA: Diagnosis present

## 2019-10-10 DIAGNOSIS — I2510 Atherosclerotic heart disease of native coronary artery without angina pectoris: Secondary | ICD-10-CM | POA: Diagnosis present

## 2019-10-10 DIAGNOSIS — Z880 Allergy status to penicillin: Secondary | ICD-10-CM

## 2019-10-10 DIAGNOSIS — Z800 Family history of malignant neoplasm of digestive organs: Secondary | ICD-10-CM

## 2019-10-10 DIAGNOSIS — M1990 Unspecified osteoarthritis, unspecified site: Secondary | ICD-10-CM | POA: Diagnosis present

## 2019-10-10 DIAGNOSIS — Z8249 Family history of ischemic heart disease and other diseases of the circulatory system: Secondary | ICD-10-CM

## 2019-10-10 DIAGNOSIS — Z801 Family history of malignant neoplasm of trachea, bronchus and lung: Secondary | ICD-10-CM

## 2019-10-10 DIAGNOSIS — D6959 Other secondary thrombocytopenia: Secondary | ICD-10-CM | POA: Diagnosis present

## 2019-10-10 DIAGNOSIS — K7460 Unspecified cirrhosis of liver: Secondary | ICD-10-CM | POA: Diagnosis present

## 2019-10-10 DIAGNOSIS — K648 Other hemorrhoids: Secondary | ICD-10-CM | POA: Diagnosis present

## 2019-10-10 DIAGNOSIS — R519 Headache, unspecified: Secondary | ICD-10-CM | POA: Diagnosis present

## 2019-10-10 DIAGNOSIS — I8511 Secondary esophageal varices with bleeding: Secondary | ICD-10-CM | POA: Diagnosis present

## 2019-10-10 DIAGNOSIS — Z885 Allergy status to narcotic agent status: Secondary | ICD-10-CM

## 2019-10-10 DIAGNOSIS — Z791 Long term (current) use of non-steroidal anti-inflammatories (NSAID): Secondary | ICD-10-CM

## 2019-10-10 DIAGNOSIS — E039 Hypothyroidism, unspecified: Secondary | ICD-10-CM | POA: Diagnosis present

## 2019-10-10 DIAGNOSIS — Z03818 Encounter for observation for suspected exposure to other biological agents ruled out: Secondary | ICD-10-CM | POA: Diagnosis not present

## 2019-10-10 DIAGNOSIS — K922 Gastrointestinal hemorrhage, unspecified: Secondary | ICD-10-CM | POA: Diagnosis not present

## 2019-10-10 DIAGNOSIS — K7290 Hepatic failure, unspecified without coma: Secondary | ICD-10-CM | POA: Diagnosis present

## 2019-10-10 DIAGNOSIS — K766 Portal hypertension: Principal | ICD-10-CM | POA: Diagnosis present

## 2019-10-10 DIAGNOSIS — I864 Gastric varices: Secondary | ICD-10-CM | POA: Diagnosis present

## 2019-10-10 DIAGNOSIS — I252 Old myocardial infarction: Secondary | ICD-10-CM

## 2019-10-10 DIAGNOSIS — Z888 Allergy status to other drugs, medicaments and biological substances status: Secondary | ICD-10-CM

## 2019-10-10 DIAGNOSIS — R710 Precipitous drop in hematocrit: Secondary | ICD-10-CM | POA: Diagnosis present

## 2019-10-10 DIAGNOSIS — K3189 Other diseases of stomach and duodenum: Secondary | ICD-10-CM | POA: Diagnosis present

## 2019-10-10 LAB — COMPREHENSIVE METABOLIC PANEL
ALT: 23 U/L (ref 0–44)
AST: 45 U/L — ABNORMAL HIGH (ref 15–41)
Albumin: 3.3 g/dL — ABNORMAL LOW (ref 3.5–5.0)
Alkaline Phosphatase: 121 U/L (ref 38–126)
Anion gap: 7 (ref 5–15)
BUN: <5 mg/dL — ABNORMAL LOW (ref 6–20)
CO2: 25 mmol/L (ref 22–32)
Calcium: 8.7 mg/dL — ABNORMAL LOW (ref 8.9–10.3)
Chloride: 109 mmol/L (ref 98–111)
Creatinine, Ser: 0.51 mg/dL (ref 0.44–1.00)
GFR calc Af Amer: >60 mL/min (ref >60)
GFR calc non Af Amer: >60 mL/min (ref >60)
Glucose, Bld: 116 mg/dL — ABNORMAL HIGH (ref 70–99)
Potassium: 3.5 mmol/L (ref 3.5–5.1)
Sodium: 141 mmol/L (ref 135–145)
Total Bilirubin: 1.4 mg/dL — ABNORMAL HIGH (ref 0.3–1.2)
Total Protein: 7.4 g/dL (ref 6.5–8.1)

## 2019-10-10 LAB — CBC
HCT: 39.2 % (ref 36.0–46.0)
Hemoglobin: 12.9 g/dL (ref 12.0–15.0)
MCH: 29.5 pg (ref 26.0–34.0)
MCHC: 32.9 g/dL (ref 30.0–36.0)
MCV: 89.7 fL (ref 80.0–100.0)
Platelets: 42 10*3/uL — ABNORMAL LOW (ref 150–400)
RBC: 4.37 MIL/uL (ref 3.87–5.11)
RDW: 13.6 % (ref 11.5–15.5)
WBC: 2.7 10*3/uL — ABNORMAL LOW (ref 4.0–10.5)
nRBC: 0 % (ref 0.0–0.2)

## 2019-10-10 NOTE — ED Triage Notes (Signed)
Pt arrived via POV with reports of bright red rectal bleeding that started just prior to arrival.  Pt states she is wearing a pad and there is blood on the pad.  Pt states when she wiped she did notice a small clot present on the toilet paper.  Pt denies any pain at this time. PT states she has had similar episode years ago, but did not get checked.

## 2019-10-11 ENCOUNTER — Inpatient Hospital Stay: Payer: Medicaid Other | Admitting: Anesthesiology

## 2019-10-11 ENCOUNTER — Encounter
Admission: EM | Disposition: A | Discharge status: Home / Self Care | Payer: Self-pay | Source: Home / Self Care | Attending: Internal Medicine

## 2019-10-11 ENCOUNTER — Inpatient Hospital Stay
Admission: EM | Admit: 2019-10-11 | Discharge: 2019-10-12 | DRG: 441 | Disposition: A | Discharge status: Home / Self Care | Payer: Medicaid Other | Attending: Internal Medicine | Admitting: Internal Medicine

## 2019-10-11 ENCOUNTER — Other Ambulatory Visit: Payer: Self-pay

## 2019-10-11 ENCOUNTER — Encounter: Payer: Self-pay | Admitting: Anesthesiology

## 2019-10-11 DIAGNOSIS — Z791 Long term (current) use of non-steroidal anti-inflammatories (NSAID): Secondary | ICD-10-CM | POA: Diagnosis not present

## 2019-10-11 DIAGNOSIS — D6959 Other secondary thrombocytopenia: Secondary | ICD-10-CM | POA: Diagnosis present

## 2019-10-11 DIAGNOSIS — F329 Major depressive disorder, single episode, unspecified: Secondary | ICD-10-CM | POA: Diagnosis not present

## 2019-10-11 DIAGNOSIS — I252 Old myocardial infarction: Secondary | ICD-10-CM | POA: Diagnosis not present

## 2019-10-11 DIAGNOSIS — E039 Hypothyroidism, unspecified: Secondary | ICD-10-CM | POA: Diagnosis not present

## 2019-10-11 DIAGNOSIS — K3189 Other diseases of stomach and duodenum: Secondary | ICD-10-CM | POA: Diagnosis not present

## 2019-10-11 DIAGNOSIS — K648 Other hemorrhoids: Secondary | ICD-10-CM | POA: Diagnosis present

## 2019-10-11 DIAGNOSIS — K625 Hemorrhage of anus and rectum: Secondary | ICD-10-CM | POA: Diagnosis not present

## 2019-10-11 DIAGNOSIS — I8510 Secondary esophageal varices without bleeding: Secondary | ICD-10-CM

## 2019-10-11 DIAGNOSIS — K766 Portal hypertension: Principal | ICD-10-CM

## 2019-10-11 DIAGNOSIS — B182 Chronic viral hepatitis C: Secondary | ICD-10-CM | POA: Diagnosis not present

## 2019-10-11 DIAGNOSIS — Z20828 Contact with and (suspected) exposure to other viral communicable diseases: Secondary | ICD-10-CM | POA: Diagnosis present

## 2019-10-11 DIAGNOSIS — R710 Precipitous drop in hematocrit: Secondary | ICD-10-CM | POA: Diagnosis present

## 2019-10-11 DIAGNOSIS — M1990 Unspecified osteoarthritis, unspecified site: Secondary | ICD-10-CM | POA: Diagnosis present

## 2019-10-11 DIAGNOSIS — Z800 Family history of malignant neoplasm of digestive organs: Secondary | ICD-10-CM | POA: Diagnosis not present

## 2019-10-11 DIAGNOSIS — Z880 Allergy status to penicillin: Secondary | ICD-10-CM | POA: Diagnosis not present

## 2019-10-11 DIAGNOSIS — I8500 Esophageal varices without bleeding: Secondary | ICD-10-CM | POA: Diagnosis not present

## 2019-10-11 DIAGNOSIS — K922 Gastrointestinal hemorrhage, unspecified: Secondary | ICD-10-CM | POA: Diagnosis not present

## 2019-10-11 DIAGNOSIS — Z885 Allergy status to narcotic agent status: Secondary | ICD-10-CM | POA: Diagnosis not present

## 2019-10-11 DIAGNOSIS — Z8249 Family history of ischemic heart disease and other diseases of the circulatory system: Secondary | ICD-10-CM | POA: Diagnosis not present

## 2019-10-11 DIAGNOSIS — R519 Headache, unspecified: Secondary | ICD-10-CM | POA: Diagnosis present

## 2019-10-11 DIAGNOSIS — B1920 Unspecified viral hepatitis C without hepatic coma: Secondary | ICD-10-CM

## 2019-10-11 DIAGNOSIS — K7460 Unspecified cirrhosis of liver: Secondary | ICD-10-CM | POA: Diagnosis not present

## 2019-10-11 DIAGNOSIS — F418 Other specified anxiety disorders: Secondary | ICD-10-CM | POA: Diagnosis not present

## 2019-10-11 DIAGNOSIS — I864 Gastric varices: Secondary | ICD-10-CM | POA: Diagnosis present

## 2019-10-11 DIAGNOSIS — Z801 Family history of malignant neoplasm of trachea, bronchus and lung: Secondary | ICD-10-CM | POA: Diagnosis not present

## 2019-10-11 DIAGNOSIS — I8511 Secondary esophageal varices with bleeding: Secondary | ICD-10-CM | POA: Diagnosis present

## 2019-10-11 DIAGNOSIS — K7469 Other cirrhosis of liver: Secondary | ICD-10-CM

## 2019-10-11 DIAGNOSIS — Z03818 Encounter for observation for suspected exposure to other biological agents ruled out: Secondary | ICD-10-CM | POA: Diagnosis not present

## 2019-10-11 DIAGNOSIS — I2510 Atherosclerotic heart disease of native coronary artery without angina pectoris: Secondary | ICD-10-CM | POA: Diagnosis not present

## 2019-10-11 DIAGNOSIS — I10 Essential (primary) hypertension: Secondary | ICD-10-CM | POA: Diagnosis not present

## 2019-10-11 DIAGNOSIS — Z888 Allergy status to other drugs, medicaments and biological substances status: Secondary | ICD-10-CM | POA: Diagnosis not present

## 2019-10-11 DIAGNOSIS — K7290 Hepatic failure, unspecified without coma: Secondary | ICD-10-CM | POA: Diagnosis present

## 2019-10-11 HISTORY — PX: ESOPHAGOGASTRODUODENOSCOPY: SHX5428

## 2019-10-11 LAB — BASIC METABOLIC PANEL
Anion gap: 6 (ref 5–15)
BUN: 5 mg/dL — ABNORMAL LOW (ref 6–20)
CO2: 25 mmol/L (ref 22–32)
Calcium: 8.5 mg/dL — ABNORMAL LOW (ref 8.9–10.3)
Chloride: 110 mmol/L (ref 98–111)
Creatinine, Ser: 0.53 mg/dL (ref 0.44–1.00)
GFR calc Af Amer: >60 mL/min (ref >60)
GFR calc non Af Amer: >60 mL/min (ref >60)
Glucose, Bld: 119 mg/dL — ABNORMAL HIGH (ref 70–99)
Potassium: 3.4 mmol/L — ABNORMAL LOW (ref 3.5–5.1)
Sodium: 141 mmol/L (ref 135–145)

## 2019-10-11 LAB — CBC
HCT: 34.8 % — ABNORMAL LOW (ref 36.0–46.0)
Hemoglobin: 11.6 g/dL — ABNORMAL LOW (ref 12.0–15.0)
MCH: 29.6 pg (ref 26.0–34.0)
MCHC: 33.3 g/dL (ref 30.0–36.0)
MCV: 88.8 fL (ref 80.0–100.0)
Platelets: 35 10*3/uL — ABNORMAL LOW (ref 150–400)
RBC: 3.92 MIL/uL (ref 3.87–5.11)
RDW: 13.8 % (ref 11.5–15.5)
WBC: 2.4 10*3/uL — ABNORMAL LOW (ref 4.0–10.5)
nRBC: 0 % (ref 0.0–0.2)

## 2019-10-11 LAB — TYPE AND SCREEN
ABO/RH(D): O NEG
Antibody Screen: NEGATIVE

## 2019-10-11 LAB — CBC WITH DIFFERENTIAL/PLATELET
Abs Immature Granulocytes: 0.01 10*3/uL (ref 0.00–0.07)
Abs Immature Granulocytes: 0.01 10*3/uL (ref 0.00–0.07)
Basophils Absolute: 0 10*3/uL (ref 0.0–0.1)
Basophils Absolute: 0 10*3/uL (ref 0.0–0.1)
Basophils Relative: 1 %
Basophils Relative: 0 %
Eosinophils Absolute: 0 10*3/uL (ref 0.0–0.5)
Eosinophils Absolute: 0.1 10*3/uL (ref 0.0–0.5)
Eosinophils Relative: 2 %
Eosinophils Relative: 2 %
HCT: 35.3 % — ABNORMAL LOW (ref 36.0–46.0)
HCT: 34.7 % — ABNORMAL LOW (ref 36.0–46.0)
Hemoglobin: 11.5 g/dL — ABNORMAL LOW (ref 12.0–15.0)
Hemoglobin: 11.5 g/dL — ABNORMAL LOW (ref 12.0–15.0)
Immature Granulocytes: 0 %
Immature Granulocytes: 0 %
Lymphocytes Relative: 25 %
Lymphocytes Relative: 27 %
Lymphs Abs: 0.6 10*3/uL — ABNORMAL LOW (ref 0.7–4.0)
Lymphs Abs: 0.7 10*3/uL (ref 0.7–4.0)
MCH: 29.5 pg (ref 26.0–34.0)
MCH: 29.6 pg (ref 26.0–34.0)
MCHC: 32.6 g/dL (ref 30.0–36.0)
MCHC: 33.1 g/dL (ref 30.0–36.0)
MCV: 90.5 fL (ref 80.0–100.0)
MCV: 89.2 fL (ref 80.0–100.0)
Monocytes Absolute: 0.2 10*3/uL (ref 0.1–1.0)
Monocytes Absolute: 0.2 10*3/uL (ref 0.1–1.0)
Monocytes Relative: 6 %
Monocytes Relative: 7 %
Neutro Abs: 1.5 10*3/uL — ABNORMAL LOW (ref 1.7–7.7)
Neutro Abs: 1.7 10*3/uL (ref 1.7–7.7)
Neutrophils Relative %: 66 %
Neutrophils Relative %: 64 %
Platelets: 40 10*3/uL — ABNORMAL LOW (ref 150–400)
Platelets: 41 10*3/uL — ABNORMAL LOW (ref 150–400)
RBC: 3.9 MIL/uL (ref 3.87–5.11)
RBC: 3.89 MIL/uL (ref 3.87–5.11)
RDW: 14 % (ref 11.5–15.5)
RDW: 13.9 % (ref 11.5–15.5)
WBC: 2.4 10*3/uL — ABNORMAL LOW (ref 4.0–10.5)
WBC: 2.7 10*3/uL — ABNORMAL LOW (ref 4.0–10.5)
nRBC: 0 % (ref 0.0–0.2)
nRBC: 0 % (ref 0.0–0.2)

## 2019-10-11 LAB — HEMOGLOBIN AND HEMATOCRIT, BLOOD
HCT: 35.9 % — ABNORMAL LOW (ref 36.0–46.0)
Hemoglobin: 11.5 g/dL — ABNORMAL LOW (ref 12.0–15.0)

## 2019-10-11 LAB — SARS CORONAVIRUS 2 BY RT PCR (HOSPITAL ORDER, PERFORMED IN ~~LOC~~ HOSPITAL LAB): SARS Coronavirus 2: NEGATIVE

## 2019-10-11 LAB — FERRITIN: Ferritin: 15 ng/mL (ref 11–307)

## 2019-10-11 LAB — IRON AND TIBC
Iron: 218 ug/dL — ABNORMAL HIGH (ref 28–170)
Saturation Ratios: 56 % — ABNORMAL HIGH (ref 10.4–31.8)
TIBC: 387 ug/dL (ref 250–450)
UIBC: 169 ug/dL

## 2019-10-11 LAB — FOLATE: Folate: 12.5 ng/mL (ref >5.9)

## 2019-10-11 SURGERY — EGD (ESOPHAGOGASTRODUODENOSCOPY)
Anesthesia: General

## 2019-10-11 MED ORDER — SODIUM CHLORIDE 0.9 % IV SOLN
INTRAVENOUS | Status: DC
  Administered 2019-10-11: 12:00:00 via INTRAVENOUS

## 2019-10-11 MED ORDER — OXYCODONE-ACETAMINOPHEN 5-325 MG PO TABS
1.0000 | ORAL_TABLET | Freq: Four times a day (QID) | ORAL | Status: DC
  Administered 2019-10-11 – 2019-10-12 (×3): 1 via ORAL
  Filled 2019-10-11 (×3): qty 1

## 2019-10-11 MED ORDER — DICLOFENAC SODIUM 1 % TD GEL: 5.0000 g | Freq: Four times a day (QID) | TRANSDERMAL | Status: DC

## 2019-10-11 MED ORDER — ONDANSETRON HCL 4 MG PO TABS
4.0000 mg | ORAL_TABLET | Freq: Four times a day (QID) | ORAL | Status: DC | PRN
  Administered 2019-10-11: 4 mg via ORAL
  Filled 2019-10-11 (×2): qty 1

## 2019-10-11 MED ORDER — SOFOSBUVIR-VELPATASVIR 400-100 MG PO TABS
1.0000 | ORAL_TABLET | Freq: Every day | ORAL | Status: DC
  Filled 2019-10-11: qty 1

## 2019-10-11 MED ORDER — ONDANSETRON HCL 4 MG/2ML IJ SOLN: 4.0000 mg | Freq: Four times a day (QID) | INTRAMUSCULAR | Status: DC | PRN

## 2019-10-11 MED ORDER — VITAMIN C 500 MG PO TABS
250.0000 mg | ORAL_TABLET | Freq: Every day | ORAL | Status: DC
  Administered 2019-10-12: 11:00:00 250 mg via ORAL
  Filled 2019-10-11: qty 1

## 2019-10-11 MED ORDER — SODIUM CHLORIDE 0.9 % IV SOLN: 10.0000 mL/h | Freq: Once | INTRAVENOUS | Status: DC

## 2019-10-11 MED ORDER — FERROUS SULFATE 325 (65 FE) MG PO TABS
325.0000 mg | ORAL_TABLET | Freq: Every day | ORAL | Status: DC
  Administered 2019-10-12: 11:00:00 325 mg via ORAL
  Filled 2019-10-11: qty 1

## 2019-10-11 MED ORDER — SODIUM CHLORIDE 0.9 % IV SOLN
8.0000 mg/h | INTRAVENOUS | Status: DC
  Administered 2019-10-11 (×2): 8 mg/h via INTRAVENOUS
  Filled 2019-10-11 (×2): qty 80

## 2019-10-11 MED ORDER — SODIUM CHLORIDE 0.9 % IV SOLN
80.0000 mg | Freq: Once | INTRAVENOUS | Status: AC
  Administered 2019-10-11: 80 mg via INTRAVENOUS
  Filled 2019-10-11: qty 80

## 2019-10-11 MED ORDER — LEVOTHYROXINE SODIUM 100 MCG PO TABS
200.0000 ug | ORAL_TABLET | Freq: Every day | ORAL | Status: DC
  Administered 2019-10-12: 06:00:00 200 ug via ORAL
  Filled 2019-10-11: qty 2

## 2019-10-11 MED ORDER — IRON-VITAMIN C 65-125 MG PO TABS: 1.0000 | ORAL_TABLET | Freq: Every day | ORAL | Status: DC

## 2019-10-11 MED ORDER — ACETAMINOPHEN 325 MG PO TABS
650.0000 mg | ORAL_TABLET | Freq: Four times a day (QID) | ORAL | Status: DC | PRN
  Administered 2019-10-11 (×2): 650 mg via ORAL
  Filled 2019-10-11 (×2): qty 2

## 2019-10-11 MED ORDER — PROPOFOL 10 MG/ML IV BOLUS
INTRAVENOUS | Status: DC | PRN
  Administered 2019-10-11 (×2): 20 mg via INTRAVENOUS
  Administered 2019-10-11: 50 mg via INTRAVENOUS

## 2019-10-11 MED ORDER — PROPOFOL 500 MG/50ML IV EMUL
INTRAVENOUS | Status: AC
  Filled 2019-10-11: qty 50

## 2019-10-11 MED ORDER — GLYCOPYRROLATE 0.2 MG/ML IJ SOLN
INTRAMUSCULAR | Status: DC | PRN
  Administered 2019-10-11: 0.2 mg via INTRAVENOUS

## 2019-10-11 MED ORDER — LIDOCAINE HCL (PF) 2 % IJ SOLN
INTRAMUSCULAR | Status: AC
  Filled 2019-10-11: qty 10

## 2019-10-11 MED ORDER — TRAZODONE HCL 50 MG PO TABS
25.0000 mg | ORAL_TABLET | Freq: Every evening | ORAL | Status: DC | PRN
  Filled 2019-10-11: qty 1
  Filled 2019-10-11: qty 0.5

## 2019-10-11 MED ORDER — LACTULOSE 10 GM/15ML PO SOLN
10.0000 g | Freq: Every day | ORAL | Status: DC
  Administered 2019-10-12: 11:00:00 10 g via ORAL
  Filled 2019-10-11 (×2): qty 30

## 2019-10-11 MED ORDER — SODIUM CHLORIDE 0.9 % IV SOLN
50.0000 ug/h | INTRAVENOUS | Status: DC
  Administered 2019-10-11 – 2019-10-12 (×4): 50 ug/h via INTRAVENOUS
  Filled 2019-10-11 (×8): qty 1

## 2019-10-11 MED ORDER — SODIUM CHLORIDE 0.9 % IV SOLN
INTRAVENOUS | Status: DC
  Administered 2019-10-11: 16:00:00 via INTRAVENOUS

## 2019-10-11 MED ORDER — VITAMIN B-12 1000 MCG PO TABS
1000.0000 ug | ORAL_TABLET | Freq: Every day | ORAL | Status: DC
  Administered 2019-10-11 – 2019-10-12 (×2): 1000 ug via ORAL
  Filled 2019-10-11 (×2): qty 1

## 2019-10-11 MED ORDER — PROPOFOL 500 MG/50ML IV EMUL
INTRAVENOUS | Status: DC | PRN
  Administered 2019-10-11: 150 ug/kg/min via INTRAVENOUS

## 2019-10-11 MED ORDER — SODIUM CHLORIDE 0.9 % IV SOLN
1.0000 g | Freq: Once | INTRAVENOUS | Status: AC
  Administered 2019-10-11: 03:00:00 1 g via INTRAVENOUS
  Filled 2019-10-11: qty 10

## 2019-10-11 MED ORDER — NITROGLYCERIN 0.4 MG/SPRAY TL SOLN
1.0000 | Status: DC | PRN
  Filled 2019-10-11: qty 4.9

## 2019-10-11 MED ORDER — ACETAMINOPHEN 650 MG RE SUPP
650.0000 mg | Freq: Four times a day (QID) | RECTAL | Status: DC | PRN
  Filled 2019-10-11 (×2): qty 1

## 2019-10-11 MED ORDER — OCTREOTIDE LOAD VIA INFUSION
50.0000 ug | Freq: Once | INTRAVENOUS | Status: AC
  Administered 2019-10-11: 50 ug via INTRAVENOUS
  Filled 2019-10-11: qty 25

## 2019-10-11 MED ORDER — VENLAFAXINE HCL ER 75 MG PO CP24
75.0000 mg | ORAL_CAPSULE | Freq: Every day | ORAL | Status: DC
  Administered 2019-10-11 – 2019-10-12 (×2): 75 mg via ORAL
  Filled 2019-10-11 (×2): qty 1

## 2019-10-11 MED ORDER — DICLOFENAC SODIUM 1 % TD GEL: 4.0000 g | Freq: Four times a day (QID) | TRANSDERMAL | Status: DC

## 2019-10-11 MED ORDER — PANTOPRAZOLE SODIUM 40 MG IV SOLR: 40.0000 mg | Freq: Two times a day (BID) | INTRAVENOUS | Status: DC

## 2019-10-11 MED ORDER — LIDOCAINE HCL (CARDIAC) PF 100 MG/5ML IV SOSY
PREFILLED_SYRINGE | INTRAVENOUS | Status: DC | PRN
  Administered 2019-10-11: 100 mg via INTRATRACHEAL

## 2019-10-11 MED ORDER — RIFAXIMIN 550 MG PO TABS
550.0000 mg | ORAL_TABLET | Freq: Two times a day (BID) | ORAL | Status: DC
  Administered 2019-10-11 – 2019-10-12 (×3): 550 mg via ORAL
  Filled 2019-10-11 (×5): qty 1

## 2019-10-11 MED ORDER — FAMOTIDINE 20 MG PO TABS
20.0000 mg | ORAL_TABLET | Freq: Every day | ORAL | Status: DC
  Administered 2019-10-11 – 2019-10-12 (×2): 20 mg via ORAL
  Filled 2019-10-11 (×2): qty 1

## 2019-10-11 NOTE — Progress Notes (Signed)
Post procedural instructions given to nurse, Jackelyn Poling. Pt stable with no noted distress.

## 2019-10-11 NOTE — ED Provider Notes (Addendum)
Assumed care from Dr. Alfred Levins at 7 AM. Briefly, the patient is a 48 y.o. female with PMHx of  has a past medical history of Arthritis, Back pain, Gout, Hypothyroidism, MI, old, and Thyroid disease. here with likely variceal GI bleed. Platelets 35. Awaiting EGD, admission today.   Labs Reviewed  COMPREHENSIVE METABOLIC PANEL - Abnormal; Notable for the following components:      Result Value   Glucose, Bld 116 (*)    BUN <5 (*)    Calcium 8.7 (*)    Albumin 3.3 (*)    AST 45 (*)    Total Bilirubin 1.4 (*)    All other components within normal limits  CBC - Abnormal; Notable for the following components:   WBC 2.7 (*)    Platelets 42 (*)    All other components within normal limits  BASIC METABOLIC PANEL - Abnormal; Notable for the following components:   Potassium 3.4 (*)    Glucose, Bld 119 (*)    BUN 5 (*)    Calcium 8.5 (*)    All other components within normal limits  CBC - Abnormal; Notable for the following components:   WBC 2.4 (*)    Hemoglobin 11.6 (*)    HCT 34.8 (*)    Platelets 35 (*)    All other components within normal limits  HEMOGLOBIN AND HEMATOCRIT, BLOOD  HEMOGLOBIN AND HEMATOCRIT, BLOOD  HEMOGLOBIN AND HEMATOCRIT, BLOOD  TYPE AND SCREEN  TYPE AND SCREEN  PREPARE PLATELET PHERESIS    Course of Care: -Labs reviewed. I was asked by Dr. Marius Ditch to transfuse platelets - pt consented, will start transfusion. Received Rocephin, on octreotide and protonix gtt. C/o mild sinus congestion - no generalized headache, no sx to suggest ICH. NPO.  -Remains HDS. Platelets ordered/transfusing. Hemodynamically stable. COVID ordered.  CRITICAL CARE Performed by: Evonnie Pat   Total critical care time: 35 minutes  Critical care time was exclusive of separately billable procedures and treating other patients.  Critical care was necessary to treat or prevent imminent or life-threatening deterioration.  Critical care was time spent personally by me on the following  activities: development of treatment plan with patient and/or surrogate as well as nursing, discussions with consultants, evaluation of patient's response to treatment, examination of patient, obtaining history from patient or surrogate, ordering and performing treatments and interventions, ordering and review of laboratory studies, ordering and review of radiographic studies, pulse oximetry and re-evaluation of patient's condition.     Duffy Bruce, MD 10/11/19 [id number]    Duffy Bruce, MD 10/11/19 [phone]

## 2019-10-11 NOTE — Anesthesia Postprocedure Evaluation (Signed)
Anesthesia Post Note  Patient: Brandi Bright  Procedure(s) Performed: ESOPHAGOGASTRODUODENOSCOPY (EGD) (N/A )  Patient location during evaluation: Endoscopy Anesthesia Type: General Level of consciousness: awake and alert and oriented Pain management: pain level controlled Vital Signs Assessment: post-procedure vital signs reviewed and stable Respiratory status: spontaneous breathing Cardiovascular status: blood pressure returned to baseline Anesthetic complications: no     Last Vitals:  Vitals:   10/11/19 1847 10/11/19 1952  BP: (!) 150/89 (!) 153/85  Pulse: 66 (!) 59  Resp: 17 18  Temp: 36.7 C 36.7 C  SpO2: 97% 96%    Last Pain:  Vitals:   10/11/19 2140  TempSrc:   PainSc: 7                  Danialle Dement

## 2019-10-11 NOTE — Op Note (Signed)
[facility] Gastroenterology Patient Name: Brandi Bright Procedure Date: 10/11/2019 2:22 PM MRN: [id number] Account #: [id number] Date of Birth: [birth year] Admit Type: Outpatient Age: 48 Room: [facility] ENDO ROOM 2 Gender: Female Note Status: Finalized Procedure:            Upper GI endoscopy Indications:          Suspected upper gastrointestinal bleeding, Cirrhosis                        with suspected esophageal varices Providers:            Lin Landsman MD, MD Medicines:            Monitored Anesthesia Care Complications:        No immediate complications. Estimated blood loss: None. Procedure:            Pre-Anesthesia Assessment:                       - Prior to the procedure, a History and Physical was                        performed, and patient medications and allergies were                        reviewed. The patient is competent. The risks and                        benefits of the procedure and the sedation options and                        risks were discussed with the patient. All questions                        were answered and informed consent was obtained.                        Patient identification and proposed procedure were                        verified by the physician, the nurse, the                        anesthesiologist, the anesthetist and the technician in                        the pre-procedure area in the procedure room in the                        endoscopy suite. Mental Status Examination: alert and                        oriented. Airway Examination: normal oropharyngeal                        airway and neck mobility. Respiratory Examination:                        clear to auscultation. CV Examination: normal.  Prophylactic Antibiotics: The patient does not require                        prophylactic antibiotics. Prior Anticoagulants: The                        patient has taken no previous  anticoagulant or                        antiplatelet agents. ASA Grade Assessment: III - A                        patient with severe systemic disease. After reviewing                        the risks and benefits, the patient was deemed in                        satisfactory condition to undergo the procedure. The                        anesthesia plan was to use monitored anesthesia care                        (MAC). Immediately prior to administration of                        medications, the patient was re-assessed for adequacy                        to receive sedatives. The heart rate, respiratory rate,                        oxygen saturations, blood pressure, adequacy of                        pulmonary ventilation, and response to care were                        monitored throughout the procedure. The physical status                        of the patient was re-assessed after the procedure.                       After obtaining informed consent, the endoscope was                        passed under direct vision. Throughout the procedure,                        the patient's blood pressure, pulse, and oxygen                        saturations were monitored continuously. The Endoscope                        was introduced through the mouth, and advanced to the  second part of duodenum. The upper GI endoscopy was                        accomplished without difficulty. The patient tolerated                        the procedure well. Findings:      The duodenal bulb and second portion of the duodenum were normal.      Severe, diffuse portal hypertensive gastropathy was found in the gastric       body and in the gastric antrum. Two areas were oozing, coagulation for       hemostasis using argon plasma was successful.      Two columns of non-bleeding large (> 5 mm) varices were found in the       lower third of the esophagus, 36 cm from the incisors. No stigmata  of       recent bleeding were evident and red wale signs were present. Scarring       from prior treatment was visible. Evidence of partial eradication was       visible. One band was successfully placed with incomplete eradication of       varices. There was no bleeding during and at the end of the procedure. Impression:           - Normal duodenal bulb and second portion of the                        duodenum.                       - Portal hypertensive gastropathy. Treated with argon                        plasma coagulation (APC).                       - Non-bleeding large (> 5 mm) esophageal varices.                        Incompletely eradicated. Banded.                       - No specimens collected. Recommendation:       - Return patient to hospital ward for ongoing care.                       - Mechanical soft diet today.                       - Continue present medications.                       - Repeat upper endoscopy in 4 weeks for assessment of                        endoscopic band ligation.                       - Return to GI office to see Dr Bonna Gains in 2 weeks. Procedure Code(s):    --- Professional ---  43244, Esophagogastroduodenoscopy, flexible, transoral;                        with band ligation of esophageal/gastric varices                       43255, 59, Esophagogastroduodenoscopy, flexible,                        transoral; with control of bleeding, any method Diagnosis Code(s):    --- Professional ---                       K74.60, Unspecified cirrhosis of liver                       I85.10, Secondary esophageal varices without bleeding                       K76.6, Portal hypertension                       K31.89, Other diseases of stomach and duodenum CPT copyright 2019 American [employer] Association. All rights reserved. The codes documented in this report are preliminary and upon coder review may  be revised to meet current compliance  requirements. Dr. Ulyess Mort Lin Landsman MD, MD 10/11/2019 4:57:39 PM This report has been signed electronically. Number of Addenda: 0 Note Initiated On: 10/11/2019 2:22 PM Estimated Blood Loss: Estimated blood loss: none.      [facility]

## 2019-10-11 NOTE — Anesthesia Preprocedure Evaluation (Signed)
Anesthesia Evaluation  Patient identified by MRN, date of birth, ID band Patient awake    Reviewed: Allergy & Precautions, NPO status , Patient's Chart, lab work & pertinent test results, reviewed documented beta blocker date and time   Airway Mallampati: III  TM Distance: >3 FB     Dental  (+) Chipped   Pulmonary           Cardiovascular hypertension, + CAD and + Past MI       Neuro/Psych    GI/Hepatic   Endo/Other  Hypothyroidism   Renal/GU      Musculoskeletal  (+) Arthritis ,   Abdominal   Peds  Hematology  (+) anemia ,   Anesthesia Other Findings MI in the remote past. No stents. EKG reviewed and ojk. ECHO 60-65. Does not take NTG anymore.  Reproductive/Obstetrics                             Anesthesia Physical  Anesthesia Plan  ASA: III  Anesthesia Plan: General   Post-op Pain Management:    Induction: Intravenous  PONV Risk Score and Plan:   Airway Management Planned: Nasal Cannula  Additional Equipment:   Intra-op Plan:   Post-operative Plan:   Informed Consent: I have reviewed the patients History and Physical, chart, labs and discussed the procedure including the risks, benefits and alternatives for the proposed anesthesia with the patient or authorized representative who has indicated his/her understanding and acceptance.       Plan Discussed with: CRNA  Anesthesia Plan Comments:         Anesthesia Quick Evaluation

## 2019-10-11 NOTE — Consult Note (Signed)
Cephas Darby, MD [address]  [place]  [place], [place] [address]  Main: [phone]  Fax: [phone] Pager: [phone]   Consultation  Referring Provider:     No ref. provider found Primary Care Physician:  Cletis Athens, MD Primary Gastroenterologist:  Dr. Bonna Gains Reason for Consultation:     Rectal bleeding  Date of Admission:  10/11/2019 Date of Consultation:  10/11/2019         HPI:   Brandi Bright is a 48 y.o. female with history of chronic hep C, on treatment with Epclusa, on weeks 5, decompensated cirrhosis with hepatic encephalopathy on rifaximin, esophageal varices s/p ligation in 09/12/2019 for primary prevention presented to ER yesterday with rectal bleeding that started yesterday associated with small clot on the toilet paper.  She reported bright red blood and her stools were soft and brown.  She denies black stools, nausea or vomiting, abdominal pain.  She does have swelling of legs.  She reports that she has been gaining weight although she admits to not eating much.  Her diet appears to be more of white bread as well as Lipton tea She has been hemodynamically stable since arrival, Hemoglobin dropped from 12.9-11.5 in last 24 hours.  Platelets 35 which are at her baseline.  She is started on octreotide, pantoprazole drips, Rocephin and GI is consulted for further evaluation Patient received 1 unit of platelets in the ER this morning after discussing with Dr. Ellender Hose Patient was previously on Coreg as primary variceal prophylaxis until 08/2019, this was stopped by patient when she had banding by Dr. Allen Norris Rifaximin for hepatic encephalopathy  NSAIDs: None  Antiplts/Anticoagulants/Anti thrombotics: None  GI Procedures: EGD 09/12/2019 for dysphagia - Grade III esophageal varices. Completely eradicated. Banded x4. - Gastritis. - A large amount of food (residue) in the stomach. - Normal examined duodenum. - No specimens collected.  EGD 06/25/2019 for  hematemesis - Normal examined duodenum. - Normal stomach. - Grade II esophageal varices. - No specimens collected.  EGD 10/27/2018 for variceal screening - Grade II esophageal varices. - Erythematous mucosa in the antrum. Biopsied. - Congested, erythematous, friable (with contact bleeding), granular and nodular mucosa in the gastric fundus and gastric body. Biopsied. - A single gastric polyp. Biopsied. - Normal duodenal bulb, second portion of the duodenum and examined duodenum. - Biopsies were obtained in the gastric body, at the incisura and in the gastric antrum.  DIAGNOSIS:  A. STOMACH, ANTRUM POLYP; BIOPSY:  - POLYPOID ULCERATED ANTRAL TYPE MUCOSA WITH CHRONIC ACTIVE MUCOSAL  INFLAMMATION AND REACTIVE EPITHELIAL CHANGES/REACTIVE GASTROPATHY.  - NO DYSPLASIA OR MALIGNANCY IDENTIFIED.  - IMMUNOHISTOCHEMICAL STAIN FOR H. PYLORI IS NEGATIVE.   B. STOMACH; BIOPSY:  - CHRONIC ACTIVE GASTRITIS WITH REACTIVE EPITHELIAL CHANGES/REACTIVE  GASTROPATHY.  - NO DYSPLASIA OR MALIGNANCY IDENTIFIED.  - IMMUNOHISTOCHEMICAL STAIN FOR H. PYLORI IS NEGATIVE.   Colonoscopy 03/16/2018  - Non-thrombosed external hemorrhoids found on perianal exam. - Four 3 to 5 mm polyps in the transverse colon and in the ascending colon, removed with a cold snare. Resected and retrieved. - One 2 mm polyp in the transverse colon, removed with a cold biopsy forceps. Resected and retrieved. - One 7 mm polyp in the descending colon, removed with a hot snare. Resected and retrieved. - One 4 mm polyp in the sigmoid colon, removed with a cold biopsy forceps. Resected and retrieved. - The examination was otherwise normal. - The rectum, sigmoid colon, descending colon, transverse colon, ascending colon and cecum  are normal. - Non-bleeding internal hemorrhoids.   DIAGNOSIS:  A. COLON POLYP X 5, ASCENDING AND TRANSVERSE; COLD SNARE AND COLD  BIOPSY:  - TUBULAR ADENOMAS, 4 FRAGMENTS, NEGATIVE FOR HIGH-GRADE DYSPLASIA  AND  MALIGNANCY.  - HYPERPLASTIC POLYP, 1 FRAGMENT, NEGATIVE FOR DYSPLASIA AND MALIGNANCY.   B. COLON POLYP, DESCENDING; HOT SNARE:  - TUBULAR ADENOMA.  - NEGATIVE FOR HIGH-GRADE DYSPLASIA AND MALIGNANCY.   C. COLON POLYP, SIGMOID; COLD BIOPSY:  - HYPERPLASTIC POLYP.  - NEGATIVE FOR DYSPLASIA AND MALIGNANCY.    Past Medical History:  Diagnosis Date  . Arthritis   . Back pain   . Gout   . Hypothyroidism   . MI, old   . Thyroid disease     Past Surgical History:  Procedure Laterality Date  . CARDIAC CATHETERIZATION    . CESAREAN SECTION    . COLONOSCOPY WITH PROPOFOL N/A 03/16/2018   Procedure: COLONOSCOPY WITH PROPOFOL;  Surgeon: Virgel Manifold, MD;  Location: [facility] ENDOSCOPY;  Service: Endoscopy;  Laterality: N/A;  . ESOPHAGOGASTRODUODENOSCOPY (EGD) WITH PROPOFOL N/A 10/27/2018   Procedure: ESOPHAGOGASTRODUODENOSCOPY (EGD) WITH PROPOFOL;  Surgeon: Virgel Manifold, MD;  Location: [facility] ENDOSCOPY;  Service: Endoscopy;  Laterality: N/A;  . ESOPHAGOGASTRODUODENOSCOPY (EGD) WITH PROPOFOL N/A 06/25/2019   Procedure: ESOPHAGOGASTRODUODENOSCOPY (EGD) WITH PROPOFOL;  Surgeon: Jonathon Bellows, MD;  Location: [facility] ENDOSCOPY;  Service: Gastroenterology;  Laterality: N/A;  . ESOPHAGOGASTRODUODENOSCOPY (EGD) WITH PROPOFOL N/A 09/12/2019   Procedure: ESOPHAGOGASTRODUODENOSCOPY (EGD) WITH PROPOFOL;  Surgeon: Lucilla Lame, MD;  Location: [facility] ENDOSCOPY;  Service: Endoscopy;  Laterality: N/A;  . TUBAL LIGATION      Prior to Admission medications   Medication Sig Start Date End Date Taking? Authorizing Provider  [employer] VITAMIN B12 1000 MCG tablet TAKE 1 TABLET BY MOUTH EVERY DAY 08/13/19  Yes Earlie Server, MD  diclofenac sodium (VOLTAREN) 1 % GEL Apply 5 g topically 4 (four) times daily. 06/06/19  Yes [provider]  famotidine (PEPCID) 20 MG tablet TAKE 1 TABLET BY MOUTH EVERY DAY 10/02/19  Yes Tahiliani, Margretta Sidle B, MD  Iron-Vitamin C (VITRON-C) 65-125 MG TABS Take 1 tablet by mouth daily.  08/15/19  Yes Earlie Server, MD  lactulose (CHRONULAC) 10 GM/15ML solution TAKE 15 MLS (10 G TOTAL) BY MOUTH DAILY FOR 30 DAYS. 08/24/19  Yes [name] B, MD  levothyroxine (SYNTHROID, LEVOTHROID) 200 MCG tablet Take 200 mcg by mouth daily. 11/26/17  Yes [provider]  meloxicam (MOBIC) 7.5 MG tablet Take 7.5 mg by mouth 2 (two) times a day.   Yes [provider]  nitroGLYCERIN (NITROLINGUAL) 0.4 MG/SPRAY spray Place 1 spray under the tongue every 5 (five) minutes x 3 doses as needed for chest pain.   Yes [provider]  oxyCODONE-acetaminophen (PERCOCET/ROXICET) 5-325 MG tablet Take 1 tablet by mouth 4 (four) times daily.  01/26/19  Yes [provider]  rifaximin (XIFAXAN) 550 MG TABS tablet Take 1 tablet (550 mg total) by mouth 2 (two) times daily. 08/30/19 10/29/19 Yes Tahiliani, Lennette Bihari, MD  Sofosbuvir-Velpatasvir (EPCLUSA) 400-100 MG TABS Take 1 tablet by mouth daily. 08/30/19  Yes [name] B, MD  spironolactone (ALDACTONE) 50 MG tablet Take 50 mg by mouth daily. 08/21/18  Yes [provider]  venlafaxine XR (EFFEXOR-XR) 75 MG 24 hr capsule Take 75 mg by mouth daily.  08/22/18  Yes [provider]    Current Facility-Administered Medications:  .  0.9 %  sodium chloride infusion, , Intravenous, Continuous, Mansy, Jan A, MD, Last Rate: 100 mL/hr  at 10/11/19 1136 .  [MAR Hold] 0.9 %  sodium chloride infusion, 10 mL/hr, Intravenous, Once, Duffy Bruce, MD, Stopped at 10/11/19 0827 .  0.9 %  sodium chloride infusion, , Intravenous, Continuous, Vanga, Tally Due, MD, Last Rate: 20 mL/hr at 10/11/19 1616 .  [MAR Hold] acetaminophen (TYLENOL) tablet 650 mg, 650 mg, Oral, Q6H PRN, 650 mg at 10/11/19 1107 **OR** [MAR Hold] acetaminophen (TYLENOL) suppository 650 mg, 650 mg, Rectal, Q6H PRN, Mansy, Arvella Merles, MD .  Doug Sou Hold] famotidine (PEPCID) tablet 20 mg, 20 mg, Oral, Daily, Mansy, Jan A, MD, 20 mg at 10/11/19 1107 .  [MAR Hold] ferrous  sulfate tablet 325 mg, 325 mg, Oral, Q breakfast, Benita Gutter, RPH .  [MAR Hold] lactulose (CHRONULAC) 10 GM/15ML solution 10 g, 10 g, Oral, Daily, Mansy, Jan A, MD .  Doug Sou Hold] levothyroxine (SYNTHROID) tablet 200 mcg, 200 mcg, Oral, Daily, Mansy, Jan A, MD .  Doug Sou Hold] nitroGLYCERIN (NITROLINGUAL) 0.4 MG/SPRAY spray 1 spray, 1 spray, Sublingual, Q5 Min x 3 PRN, Mansy, Jan A, MD .  [COMPLETED] octreotide (SANDOSTATIN) 2 mcg/mL load via infusion 50 mcg, 50 mcg, Intravenous, Once, 50 mcg at 10/11/19 0401 **AND** octreotide (SANDOSTATIN) 500 mcg in sodium chloride 0.9 % 250 mL (2 mcg/mL) infusion, 50 mcg/hr, Intravenous, Continuous, Veronese, [place], MD, Last Rate: 25 mL/hr at 10/11/19 1305, 50 mcg/hr at 10/11/19 1305 .  [MAR Hold] ondansetron (ZOFRAN) tablet 4 mg, 4 mg, Oral, Q6H PRN, 4 mg at 10/11/19 0410 **OR** [MAR Hold] ondansetron (ZOFRAN) injection 4 mg, 4 mg, Intravenous, Q6H PRN, Mansy, Arvella Merles, MD .  Doug Sou Hold] oxyCODONE-acetaminophen (PERCOCET/ROXICET) 5-325 MG per tablet 1 tablet, 1 tablet, Oral, QID, Mansy, Jan A, MD .  pantoprazole (PROTONIX) 80 mg in sodium chloride 0.9 % 250 mL (0.32 mg/mL) infusion, 8 mg/hr, Intravenous, Continuous, Veronese, [place], MD, Last Rate: 25 mL/hr at 10/11/19 1306, 8 mg/hr at 10/11/19 1306 .  [MAR Hold] pantoprazole (PROTONIX) injection 40 mg, 40 mg, Intravenous, Q12H, Veronese, [place], MD .  Doug Sou Hold] rifaximin Doreene Nest) tablet 550 mg, 550 mg, Oral, BID, Mansy, Jan A, MD, 550 mg at 10/11/19 1148 .  [MAR Hold] Sofosbuvir-Velpatasvir 400-100 MG TABS 1 tablet, 1 tablet, Oral, Daily, Mansy, Jan A, MD .  Doug Sou Hold] traZODone (DESYREL) tablet 25 mg, 25 mg, Oral, QHS PRN, Mansy, Arvella Merles, MD .  Doug Sou Hold] venlafaxine XR (EFFEXOR-XR) 24 hr capsule 75 mg, 75 mg, Oral, Daily, Mansy, Jan A, MD, 75 mg at 10/11/19 1108 .  [MAR Hold] vitamin B-12 (CYANOCOBALAMIN) tablet 1,000 mcg, 1,000 mcg, Oral, Daily, Mansy, Jan A, MD, 1,000 mcg at 10/11/19 1108 .  [MAR Hold]  vitamin C (ASCORBIC ACID) tablet 125 mg, 125 mg, Oral, Q breakfast, Benita Gutter, RPH  Family History  Problem Relation Age of Onset  . Hypertension Other   . CAD Father   . Colon cancer Father   . Lung cancer Father   . Pancreatic cancer Brother      Social History   Tobacco Use  . Smoking status: Never Smoker  . Smokeless tobacco: Never Used  Substance Use Topics  . Alcohol use: Not Currently    Comment: has not had a drink in about 5 months  . Drug use: Never    Allergies as of 10/10/2019 - Review Complete 10/10/2019  Allergen Reaction Noted  . Vicodin [hydrocodone-acetaminophen] Rash 01/11/2016  . Amoxicillin Rash and Other (See Comments) 01/11/2016  . Gabapentin Rash 01/11/2016    Review of Systems:  All systems reviewed and negative except where noted in HPI.   Physical Exam:  Vital signs in last 24 hours: Temp:  [97 F (36.1 C)-98.4 F (36.9 C)] 97 F (36.1 C) (10/28 1657) Pulse Rate:  [61-103] 83 (10/28 1707) Resp:  [16-19] 18 (10/28 1707) BP: (120-148)/(72-110) 141/91 (10/28 1707) SpO2:  [91 %-100 %] 97 % (10/28 1707) Weight:  [80.3 kg] 80.3 kg (10/28 1611)   General:   Pleasant, cooperative in NAD Head:  Normocephalic and atraumatic. Eyes:   No icterus.   Conjunctiva pink. PERRLA. Ears:  Normal auditory acuity. Neck:  Supple; no masses or thyroidomegaly Lungs: Respirations even and unlabored. Lungs clear to auscultation bilaterally.   No wheezes, crackles, or rhonchi.  Heart:  Regular rate and rhythm;  Without murmur, clicks, rubs or gallops Abdomen:  Soft, obese, nondistended, nontender. Normal bowel sounds. No appreciable masses or hepatomegaly.  No rebound or guarding.  Rectal:  Not performed. Msk:  Symmetrical without gross deformities.  Strength normal Extremities: 2+edema, cyanosis or clubbing. Neurologic:  Alert and oriented x3;  grossly normal neurologically. Skin:  Intact without significant lesions or rashes. Psych:  Alert and  cooperative. Normal affect.  LAB RESULTS: CBC Latest Ref Rng & Units 10/11/2019 10/11/2019 10/11/2019  WBC 4.0 - 10.5 K/uL 2.4(L) - 2.4(L)  Hemoglobin 12.0 - 15.0 g/dL 11.5(L) 11.5(L) 11.6(L)  Hematocrit 36.0 - 46.0 % 35.3(L) 35.9(L) 34.8(L)  Platelets 150 - 400 K/uL 40(L) - 35(L)    BMET BMP Latest Ref Rng & Units 10/11/2019 10/10/2019 06/28/2019  Glucose 70 - 99 mg/dL 119(H) 116(H) CANCELED  BUN 6 - 20 mg/dL 5(L) <5(L) CANCELED  Creatinine 0.44 - 1.00 mg/dL 0.53 0.51 CANCELED  BUN/Creat Ratio - - - CANCELED  Sodium 135 - 145 mmol/L 141 141 CANCELED  Potassium 3.5 - 5.1 mmol/L 3.4(L) 3.5 CANCELED  Chloride 98 - 111 mmol/L 110 109 CANCELED  CO2 22 - 32 mmol/L 25 25 CANCELED  Calcium 8.9 - 10.3 mg/dL 8.5(L) 8.7(L) CANCELED    LFT Hepatic Function Latest Ref Rng & Units 10/10/2019 06/28/2019 06/24/2019  Total Protein 6.5 - 8.1 g/dL 7.4 CANCELED 7.0  Albumin 3.5 - 5.0 g/dL 3.3(L) CANCELED 3.3(L)  AST 15 - 41 U/L 45(H) CANCELED 193(H)  ALT 0 - 44 U/L 23 CANCELED 154(H)  Alk Phosphatase 38 - 126 U/L 121 CANCELED 86  Total Bilirubin 0.3 - 1.2 mg/dL 1.4(H) CANCELED 1.1     STUDIES: No results found.    Impression / Plan:   AIMA MCWHIRT is a 48 y.o. female with chronic hep C, treatment nave, decompensated cirrhosis with hepatic encephalopathy, portal hypertension manifested as thrombocytopenia, esophageal varices s/p variceal ligation for primary variceal prophylaxis admitted with rectal bleeding and mild drop in hemoglobin. Rectal bleeding is most likely secondary to hemorrhoids based on her history.  However, she had a drop in hemoglobin by 1 g, therefore with her history of esophageal varices, recommend upper endoscopy to evaluate for variceal banding.    Continue pantoprazole and octreotide drips Ceftriaxone for SBP prophylaxis Rifaximin for hepatic encephalopathy Monitor CBC closely N.p.o. EGD today, patient received 1 unit of platelets, will recheck platelet count   Decompensated cirrhosis Continue rifaximin No evidence of ascites No evidence of liver lesions based on recent imaging  Chronic hep C, genotype 1a on Epclusa Recheck HCVRNA Continue Epclusa while inpatient, patient's son will bring the medication to her tonight  Thank you for involving me in the care of this patient.  LOS: 0 days   Sherri Sear, MD  10/11/2019, 5:12 PM   Note: This dictation was prepared with Dragon dictation along with smaller phrase technology. Any transcriptional errors that result from this process are unintentional.

## 2019-10-11 NOTE — ED Notes (Signed)
Pt assisted to bathroom

## 2019-10-11 NOTE — ED Provider Notes (Signed)
[facility] Emergency Department Provider Note  ____________________________________________  Time seen: Approximately 2:50 AM  I have reviewed the triage vital signs and the nursing notes.   HISTORY  Chief Complaint Rectal Bleeding   HPI Brandi Bright is a 48 y.o. 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  Patient reports having 2 episodes of bloody stool.  Describes the blood as bright red with some clots.  No abdominal pain.  She has had nausea but no vomiting, no hematemesis or coffee-ground emesis.  No dizziness, chest pain or shortness of breath.  She is not on blood thinners.  These episodes happened this evening.  She denies having melena prior.   Past Medical History:  Diagnosis Date  . Arthritis   . Back pain   . Gout   . Hypothyroidism   . MI, old   . Thyroid disease     Patient Active Problem List   Diagnosis Date Noted  . Dysphagia   . Gastritis without bleeding   . GI bleed 06/24/2019  . Iron deficiency anemia 10/31/2018  . Low vitamin B12 level 10/31/2018  . Low serum copper for age 33/18/2019  . Positive ANA (antinuclear antibody) 10/31/2018  . Other cirrhosis of liver ([place])   . Secondary esophageal varices without bleeding ([place])   . Stomach irritation   . Gastric polyp   . Normocytic anemia 09/23/2018  . Thrombocytopenia ([place]) 09/23/2018  . Leukopenia 09/23/2018  . Splenomegaly 09/23/2018  . Colon cancer screening   . Benign neoplasm of descending colon   . Benign neoplasm of ascending colon   . Family history of malignant neoplasm of gastrointestinal tract   . Benign neoplasm of transverse colon   . Polyp of sigmoid colon   . Chest pain 12/29/2017  . Anxiety and depression 04/30/2014  . Chronic pain with drug dependence ([place]) 04/30/2014  . Coronary artery disease 04/30/2014  . Degenerative disc disease 04/30/2014  . Hyperlipidemia 04/30/2014   . Hypertension 04/30/2014  . Hypothyroid 04/30/2014    Past Surgical History:  Procedure Laterality Date  . CARDIAC CATHETERIZATION    . CESAREAN SECTION    . COLONOSCOPY WITH PROPOFOL N/A 03/16/2018   Procedure: COLONOSCOPY WITH PROPOFOL;  Surgeon: Virgel Manifold, MD;  Location: [facility] ENDOSCOPY;  Service: Endoscopy;  Laterality: N/A;  . ESOPHAGOGASTRODUODENOSCOPY (EGD) WITH PROPOFOL N/A 10/27/2018   Procedure: ESOPHAGOGASTRODUODENOSCOPY (EGD) WITH PROPOFOL;  Surgeon: Virgel Manifold, MD;  Location: [facility] ENDOSCOPY;  Service: Endoscopy;  Laterality: N/A;  . ESOPHAGOGASTRODUODENOSCOPY (EGD) WITH PROPOFOL N/A 06/25/2019   Procedure: ESOPHAGOGASTRODUODENOSCOPY (EGD) WITH PROPOFOL;  Surgeon: Jonathon Bellows, MD;  [facility] ENDOSCOPY;  Service: Gastroenterology;  Laterality: N/A;  . ESOPHAGOGASTRODUODENOSCOPY (EGD) WITH PROPOFOL N/A 09/12/2019   Procedure: ESOPHAGOGASTRODUODENOSCOPY (EGD) WITH PROPOFOL;  Surgeon: Lucilla Lame, MD;  Location: [facility] ENDOSCOPY;  Service: Endoscopy;  Laterality: N/A;  . TUBAL LIGATION      Prior to Admission medications   Medication Sig Start Date End Date Taking? Authorizing Provider  carvedilol (COREG) 6.25 MG tablet Take 1 tablet (6.25 mg total) by mouth every morning. 08/30/19 09/29/19  Virgel Manifold, MD  [employer] VITAMIN B12 1000 MCG tablet TAKE 1 TABLET BY MOUTH EVERY DAY 08/13/19   Earlie Server, MD  diclofenac sodium (VOLTAREN) 1 % GEL Apply 5 g topically 4 (four) times daily. 06/06/19   [provider]  famotidine (PEPCID) 20 MG tablet TAKE 1 TABLET BY MOUTH EVERY DAY  10/02/19   Virgel Manifold, MD  Iron-Vitamin C (VITRON-C) 65-125 MG TABS Take 1 tablet by mouth daily. 08/15/19   Earlie Server, MD  lactulose (CHRONULAC) 10 GM/15ML solution TAKE 15 MLS (10 G TOTAL) BY MOUTH DAILY FOR 30 DAYS. 08/24/19   Virgel Manifold, MD  levothyroxine (SYNTHROID, LEVOTHROID) 200 MCG tablet Take 200 mcg by mouth daily. 11/26/17   [provider]   meloxicam (MOBIC) 7.5 MG tablet Take 7.5 mg by mouth 2 (two) times a day.    [provider]  nitroGLYCERIN (NITROLINGUAL) 0.4 MG/SPRAY spray Place 1 spray under the tongue every 5 (five) minutes x 3 doses as needed for chest pain.    [provider]  oxyCODONE-acetaminophen (PERCOCET/ROXICET) 5-325 MG tablet Take 1 tablet by mouth 4 (four) times daily.  01/26/19   [provider]  rifaximin (XIFAXAN) 550 MG TABS tablet Take 1 tablet (550 mg total) by mouth 2 (two) times daily. 08/30/19 10/29/19  Virgel Manifold, MD  Sofosbuvir-Velpatasvir (EPCLUSA) 400-100 MG TABS Take 1 tablet by mouth daily. 08/30/19   Virgel Manifold, MD  spironolactone (ALDACTONE) 50 MG tablet Take 50 mg by mouth daily. 08/21/18   [provider]  venlafaxine XR (EFFEXOR-XR) 75 MG 24 hr capsule Take 75 mg by mouth daily.  08/22/18   [provider]    Allergies Vicodin [hydrocodone-acetaminophen], Amoxicillin, and Gabapentin  Family History  Problem Relation Age of Onset  . Hypertension Other   . CAD Father   . Colon cancer Father   . Lung cancer Father   . Pancreatic cancer Brother     Social History Social History   Tobacco Use  . Smoking status: Never Smoker  . Smokeless tobacco: Never Used  Substance Use Topics  . Alcohol use: Not Currently    Comment: has not had a drink in about 5 months  . Drug use: Never    Review of Systems  Constitutional: Negative for fever. Eyes: Negative for visual changes. ENT: Negative for sore throat. Neck: No neck pain  Cardiovascular: Negative for chest pain. Respiratory: Negative for shortness of breath. Gastrointestinal: Negative for abdominal pain, vomiting or diarrhea. + rectal bleeding Genitourinary: Negative for dysuria. Musculoskeletal: Negative for back pain. Skin: Negative for rash. Neurological: Negative for headaches, weakness or numbness. Psych: No SI or HI  ____________________________________________    PHYSICAL EXAM:  VITAL SIGNS: ED Triage Vitals [10/10/19 2248]  Enc Vitals Group     BP (!) 144/110     Pulse Rate (!) 103     Resp 18     Temp 98.4 F (36.9 C)     Temp Source Oral     SpO2 99 %     Weight 177 lb (80.3 kg)     Height 4\' 9"  (1.448 m)     Head Circumference      Peak Flow      Pain Score 0     Pain Loc      Pain Edu?      Excl. in [place]?     Constitutional: Alert and oriented. Well appearing and in no apparent distress. HEENT:      Head: Normocephalic and atraumatic.         Eyes: Conjunctivae are normal. Sclera is non-icteric.       Mouth/Throat: Mucous membranes are moist.       Neck: Supple with no signs of meningismus. Cardiovascular: Regular rate and rhythm. No murmurs, gallops, or rubs. 2+ symmetrical distal  pulses are present in all extremities. No JVD. Respiratory: Normal respiratory effort. Lungs are clear to auscultation bilaterally. No wheezes, crackles, or rhonchi.  Gastrointestinal: Soft, non tender, and non distended with positive bowel sounds. No rebound or guarding. Genitourinary: Rectal exam showing bright red blood in the rectal vault, no stool, external hemorrhoid with no bleeding. Musculoskeletal: Nontender with normal range of motion in all extremities. No edema, cyanosis, or erythema of extremities. Neurologic: Normal speech and language. Face is symmetric. Moving all extremities. No gross focal neurologic deficits are appreciated. Skin: Skin is warm, dry and intact. No rash noted. Psychiatric: Mood and affect are normal. Speech and behavior are normal.  ____________________________________________   LABS (all labs ordered are listed, but only abnormal results are displayed)  Labs Reviewed  COMPREHENSIVE METABOLIC PANEL - Abnormal; Notable for the following components:      Result Value   Glucose, Bld 116 (*)    BUN <5 (*)    Calcium 8.7 (*)    Albumin 3.3 (*)    AST 45 (*)    Total Bilirubin 1.4 (*)    All other components  within normal limits  CBC - Abnormal; Notable for the following components:   WBC 2.7 (*)    Platelets 42 (*)    All other components within normal limits  TYPE AND SCREEN   ____________________________________________  EKG  none  ____________________________________________  RADIOLOGY  none  ____________________________________________   PROCEDURES  Procedure(s) performed: None Procedures Critical Care performed:  None ____________________________________________   INITIAL IMPRESSION / ASSESSMENT AND PLAN / ED COURSE  48 y.o. 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   Initially slightly tachycardic with a pulse of 103, normotensive, with normal hemoglobin, rectal exam showing bright red blood with no stool in the rectal vault.  With a history of cirrhosis, varices, and platelets of 42 will admit patient for monitoring for possible fast upper GI bleed versus lower GI bleed.  No indication for transfusion at this time.  Will have a type and cross in case patient needs blood transfusion.  Will start octreotide and Protonix.  Will give Rocephin.  Will admit to the hospitalist service.       As part of my medical decision making, I reviewed the following data within the [place] notes reviewed and incorporated, Labs reviewed , Old chart reviewed, Notes from prior ED visits and [place] Controlled Substance Database   Patient was evaluated in Emergency Department today for the symptoms described in the history of present illness. Patient was evaluated in the context of the global COVID-19 pandemic, which necessitated consideration that the patient might be at risk for infection with the SARS-CoV-2 virus that causes COVID-19. Institutional protocols and algorithms that pertain to the evaluation of patients at risk for COVID-19 are in a state of rapid change based on information  released by regulatory bodies including the CDC and federal and state organizations. These policies and algorithms were followed during the patient's care in the ED.   ____________________________________________   FINAL CLINICAL IMPRESSION(S) / ED DIAGNOSES   Final diagnoses:  Gastrointestinal hemorrhage, unspecified gastrointestinal hemorrhage type      NEW MEDICATIONS STARTED DURING THIS VISIT:  ED Discharge Orders    None       Note:  This document was prepared using Dragon voice recognition software and may include unintentional dictation errors.    Rudene Re, MD 10/11/19 [phone]

## 2019-10-11 NOTE — H&P (Addendum)
[facility] [place] NAME: Brandi Bright    MR#:  [id number]  DATE OF BIRTH:  May 29, 1971  DATE OF ADMISSION:  10/11/2019  PRIMARY CARE PHYSICIAN: Cletis Athens, MD   REQUESTING/REFERRING PHYSICIAN: Gonzella Lex, MD  CHIEF COMPLAINT:   Chief Complaint  Patient presents with  . Rectal Bleeding    HISTORY OF PRESENT ILLNESS:  Brandi Bright  is a 48 y.o. Caucasian female with a known history of post hepatitis C cirrhosis and grade 2 esophageal varices on recent EGD, presented to the emergency room with acute onset of bright red bleeding per rectum.  She stated that she had 2 episodes of bleeding and one was a blood clot.  She admitted to lower abdominal discomfort.  She denied any melena.  She has been nauseated but denies any vomiting.  She has been having mild headache without dizziness or blurred vision.  No fever or chills.  No dysuria, oliguria or hematuria or flank pain.  No other bleeding diathesis.  She denied any cough or wheezing or shortness of breath.  COVID-19 test is currently pending.  Upon presentation to the emergency room, blood pressure was 144/110 and later 120/70 with a pulse of 103 and later 82.  Vital signs otherwise were within normal.  Labs are remarkable for borderline potassium of 3.5 AST 45 and total bili 1.4, albumin of 3.3 and total protein of 7.4.  She was typed and crossmatched.  Hemoglobin was 12.9 and hematocrit 39.2 with WBC of 2.7 and platelets 42.  The patient was given Protonix bolus and drip, octreotide bolus and drip and IV Rocephin.  She will be admitted to medical monitored bed for further evaluation and management.   PAST MEDICAL HISTORY:   Past Medical History:  Diagnosis Date  . Arthritis   . Back pain   . Gout   . Hypothyroidism   . MI, old   . Thyroid disease     PAST SURGICAL HISTORY:   Past Surgical History:  Procedure Laterality Date  . CARDIAC CATHETERIZATION    . CESAREAN  SECTION    . COLONOSCOPY WITH PROPOFOL N/A 03/16/2018   Procedure: COLONOSCOPY WITH PROPOFOL;  Surgeon: Virgel Manifold, MD;  Location: [facility] ENDOSCOPY;  Service: Endoscopy;  Laterality: N/A;  . ESOPHAGOGASTRODUODENOSCOPY (EGD) WITH PROPOFOL N/A 10/27/2018   Procedure: ESOPHAGOGASTRODUODENOSCOPY (EGD) WITH PROPOFOL;  Surgeon: Virgel Manifold, MD;  Location: [facility] ENDOSCOPY;  Service: Endoscopy;  Laterality: N/A;  . ESOPHAGOGASTRODUODENOSCOPY (EGD) WITH PROPOFOL N/A 06/25/2019   Procedure: ESOPHAGOGASTRODUODENOSCOPY (EGD) WITH PROPOFOL;  Surgeon: Jonathon Bellows, MD;  [facility] ENDOSCOPY;  Service: Gastroenterology;  Laterality: N/A;  . ESOPHAGOGASTRODUODENOSCOPY (EGD) WITH PROPOFOL N/A 09/12/2019   Procedure: ESOPHAGOGASTRODUODENOSCOPY (EGD) WITH PROPOFOL;  Surgeon: Lucilla Lame, MD;  Location: [facility] ENDOSCOPY;  Service: Endoscopy;  Laterality: N/A;  . TUBAL LIGATION      SOCIAL HISTORY:   Social History   Tobacco Use  . Smoking status: Never Smoker  . Smokeless tobacco: Never Used  Substance Use Topics  . Alcohol use: Not Currently    Comment: has not had a drink in about 5 months    FAMILY HISTORY:   Family History  Problem Relation Age of Onset  . Hypertension Other   . CAD Father   . Colon cancer Father   . Lung cancer Father   . Pancreatic cancer Brother   EGD and colonoscopy  DRUG ALLERGIES:   Allergies  Allergen Reactions  . Vicodin [Hydrocodone-Acetaminophen] Rash  .  Amoxicillin Rash and Other (See Comments)    Has patient had a PCN reaction causing immediate rash, facial/tongue/throat swelling, SOB or lightheadedness with hypotension: Yes Has patient had a PCN reaction causing severe rash involving mucus membranes or skin necrosis: No Has patient had a PCN reaction that required hospitalization: No Has patient had a PCN reaction occurring within the last 10 years: No If all of the above answers are "NO", then may proceed with Cephalosporin use.   .  Gabapentin Rash    REVIEW OF SYSTEMS:   ROS As per history of present illness. All pertinent systems were reviewed above. Constitutional,  HEENT, cardiovascular, respiratory, GI, GU, musculoskeletal, neuro, psychiatric, endocrine,  integumentary and hematologic systems were reviewed and are otherwise  negative/unremarkable except for positive findings mentioned above in the HPI.   MEDICATIONS AT HOME:   Prior to Admission medications   Medication Sig Start Date End Date Taking? Authorizing Provider  [employer] VITAMIN B12 1000 MCG tablet TAKE 1 TABLET BY MOUTH EVERY DAY 08/13/19  Yes Earlie Server, MD  diclofenac sodium (VOLTAREN) 1 % GEL Apply 5 g topically 4 (four) times daily. 06/06/19  Yes [provider]  famotidine (PEPCID) 20 MG tablet TAKE 1 TABLET BY MOUTH EVERY DAY 10/02/19  Yes Tahiliani, Margretta Sidle B, MD  Iron-Vitamin C (VITRON-C) 65-125 MG TABS Take 1 tablet by mouth daily. 08/15/19  Yes Earlie Server, MD  lactulose (CHRONULAC) 10 GM/15ML solution TAKE 15 MLS (10 G TOTAL) BY MOUTH DAILY FOR 30 DAYS. 08/24/19  Yes [name] B, MD  levothyroxine (SYNTHROID, LEVOTHROID) 200 MCG tablet Take 200 mcg by mouth daily. 11/26/17  Yes [provider]  meloxicam (MOBIC) 7.5 MG tablet Take 7.5 mg by mouth 2 (two) times a day.   Yes [provider]  nitroGLYCERIN (NITROLINGUAL) 0.4 MG/SPRAY spray Place 1 spray under the tongue every 5 (five) minutes x 3 doses as needed for chest pain.   Yes [provider]  oxyCODONE-acetaminophen (PERCOCET/ROXICET) 5-325 MG tablet Take 1 tablet by mouth 4 (four) times daily.  01/26/19  Yes [provider]  rifaximin (XIFAXAN) 550 MG TABS tablet Take 1 tablet (550 mg total) by mouth 2 (two) times daily. 08/30/19 10/29/19 Yes Tahiliani, Lennette Bihari, MD  Sofosbuvir-Velpatasvir (EPCLUSA) 400-100 MG TABS Take 1 tablet by mouth daily. 08/30/19  Yes [name] B, MD  spironolactone (ALDACTONE) 50 MG tablet Take 50 mg by mouth  daily. 08/21/18  Yes [provider]  venlafaxine XR (EFFEXOR-XR) 75 MG 24 hr capsule Take 75 mg by mouth daily.  08/22/18  Yes [provider]      VITAL SIGNS:  Blood pressure 120/72, pulse 82, temperature 98.4 F (36.9 C), temperature source Oral, resp. rate 16, height 4\' 9"  (1.448 m), weight 80.3 kg, SpO2 97 %.  PHYSICAL EXAMINATION:  Physical Exam  GENERAL:  48 y.o.-year-old Caucasian female patient lying in the bed with no acute distress.  EYES: Pupils equal, round, reactive to light and accommodation. No scleral icterus. Extraocular muscles intact.  HEENT: Head atraumatic, normocephalic. Oropharynx and nasopharynx clear.  NECK:  Supple, no jugular venous distention. No thyroid enlargement, no tenderness.  LUNGS: Normal breath sounds bilaterally, no wheezing, rales,rhonchi or crepitation. No use of accessory muscles of respiration.  CARDIOVASCULAR: Regular rate and rhythm, S1, S2 normal. No murmurs, rubs, or gallops.  ABDOMEN: Soft, nondistended, nontender. Bowel sounds present. No organomegaly or mass.  Rectal exam was performed by the ER physician and revealed blood in the rectal vault and  external hemorrhoid without bleeding. EXTREMITIES: No pedal edema, cyanosis, or clubbing.  NEUROLOGIC: Cranial nerves II through XII are intact. Muscle strength 5/5 in all extremities. Sensation intact. Gait not checked.  PSYCHIATRIC: The patient is alert and oriented x 3.  Normal affect and good eye contact. SKIN: No obvious rash, lesion, or ulcer.   LABORATORY PANEL:   CBC Recent Labs  Lab 10/10/19 2256  WBC 2.7*  HGB 12.9  HCT 39.2  PLT 42*   ------------------------------------------------------------------------------------------------------------------  Chemistries  Recent Labs  Lab 10/10/19 2256  NA 141  K 3.5  CL 109  CO2 25  GLUCOSE 116*  BUN <5*  CREATININE 0.51  CALCIUM 8.7*  AST 45*  ALT 23  ALKPHOS 121  BILITOT 1.4*    ------------------------------------------------------------------------------------------------------------------  Cardiac Enzymes No results for input(s): TROPONINI in the last 168 hours. ------------------------------------------------------------------------------------------------------------------  RADIOLOGY:  No results found.    IMPRESSION AND PLAN:   1.  GI bleeding.  I suspect lower GI etiology though with her history of post hepatitis C cirrhosis and esophageal varices, there could be an element of upper GI bleeding as well.  Patient will be admitted to medical monitored bed.  Continue IV octreotide and Protonix drips.  A GI consultation will be obtained by Dr. Vicente Males who was notified about the patient.  At this time her hemoglobin and hematocrits are fairly stable.  She was typed and crossmatched.  She does not need any current transfusion.  We will follow serial hemoglobin and hematocrits.  We will stop her Mobic and diuretics.    2.  Hypothyroidism.  We will check TSH level and continue Synthroid.  3.  Depression.  Effexor XR will be resumed.  4.  Post hepatitis C liver cirrhosis.  We will continue lactulose and Epclusa.  5.  DVT prophylaxis.  SCDs.  Medical prophylaxis currently contraindicated due to thrombocytopenia and suspected coagulopathy with chronic liver disease.  6.  GI prophylaxis.  This was addressed above with IV Protonix drip.   All the records are reviewed and case discussed with ED provider. The plan of care was discussed in details with the patient (and family). I answered all questions. The patient agreed to proceed with the above mentioned plan. Further management will depend upon hospital course.   CODE STATUS: Full code  TOTAL TIME TAKING CARE OF THIS PATIENT: 55 minutes.    Christel Mormon M.D on 10/11/2019 at 3:16 AM  Pager - [phone]  After 6pm go to [email or web address] - Proofreader  [facility]  Office   [phone]  CC: Primary care physician; Cletis Athens, MD   Note: This dictation was prepared with Dragon dictation along with smaller phrase technology. Any transcriptional errors that result from this process are unintentional.

## 2019-10-11 NOTE — Transfer of Care (Signed)
Immediate Anesthesia Transfer of Care Note  Patient: [name]  Procedure(s) Performed: ESOPHAGOGASTRODUODENOSCOPY (EGD) (N/A )  Patient Location: PACU  Anesthesia Type:General  Level of Consciousness: sedated  Airway & Oxygen Therapy: Patient Spontanous Breathing and Patient connected to nasal cannula oxygen  Post-op Assessment: Report given to RN and Post -op Vital signs reviewed and stable  Post vital signs: Reviewed and stable  Last Vitals:  Vitals Value Taken Time  BP 145/99 10/11/19 1657  Temp    Pulse 94 10/11/19 1658  Resp 26 10/11/19 1658  SpO2 95 % 10/11/19 1658  Vitals shown include unvalidated device data.  Last Pain:  Vitals:   10/11/19 1657  TempSrc: (P) Tympanic  PainSc:          Complications: No apparent anesthesia complications

## 2019-10-11 NOTE — Anesthesia Post-op Follow-up Note (Signed)
Anesthesia QCDR form completed.        

## 2019-10-12 ENCOUNTER — Encounter: Payer: Self-pay | Admitting: Gastroenterology

## 2019-10-12 ENCOUNTER — Ambulatory Visit: Payer: Medicaid Other | Admitting: Gastroenterology

## 2019-10-12 DIAGNOSIS — K625 Hemorrhage of anus and rectum: Secondary | ICD-10-CM | POA: Diagnosis not present

## 2019-10-12 DIAGNOSIS — K7469 Other cirrhosis of liver: Secondary | ICD-10-CM | POA: Diagnosis not present

## 2019-10-12 LAB — CBC WITH DIFFERENTIAL/PLATELET
Abs Immature Granulocytes: 0.01 10*3/uL (ref 0.00–0.07)
Abs Immature Granulocytes: 0.01 10*3/uL (ref 0.00–0.07)
Basophils Absolute: 0 10*3/uL (ref 0.0–0.1)
Basophils Absolute: 0 10*3/uL (ref 0.0–0.1)
Basophils Relative: 0 %
Basophils Relative: 1 %
Eosinophils Absolute: 0.1 10*3/uL (ref 0.0–0.5)
Eosinophils Absolute: 0.1 10*3/uL (ref 0.0–0.5)
Eosinophils Relative: 3 %
Eosinophils Relative: 3 %
HCT: 34.6 % — ABNORMAL LOW (ref 36.0–46.0)
HCT: 35.4 % — ABNORMAL LOW (ref 36.0–46.0)
Hemoglobin: 11.4 g/dL — ABNORMAL LOW (ref 12.0–15.0)
Hemoglobin: 11.4 g/dL — ABNORMAL LOW (ref 12.0–15.0)
Immature Granulocytes: 0 %
Immature Granulocytes: 0 %
Lymphocytes Relative: 31 %
Lymphocytes Relative: 32 %
Lymphs Abs: 0.8 10*3/uL (ref 0.7–4.0)
Lymphs Abs: 0.8 10*3/uL (ref 0.7–4.0)
MCH: 29.4 pg (ref 26.0–34.0)
MCH: 29.4 pg (ref 26.0–34.0)
MCHC: 32.9 g/dL (ref 30.0–36.0)
MCHC: 32.2 g/dL (ref 30.0–36.0)
MCV: 89.2 fL (ref 80.0–100.0)
MCV: 91.2 fL (ref 80.0–100.0)
Monocytes Absolute: 0.2 10*3/uL (ref 0.1–1.0)
Monocytes Absolute: 0.2 10*3/uL (ref 0.1–1.0)
Monocytes Relative: 8 %
Monocytes Relative: 9 %
Neutro Abs: 1.4 10*3/uL — ABNORMAL LOW (ref 1.7–7.7)
Neutro Abs: 1.4 10*3/uL — ABNORMAL LOW (ref 1.7–7.7)
Neutrophils Relative %: 58 %
Neutrophils Relative %: 55 %
Platelets: 37 10*3/uL — ABNORMAL LOW (ref 150–400)
Platelets: 37 10*3/uL — ABNORMAL LOW (ref 150–400)
RBC: 3.88 MIL/uL (ref 3.87–5.11)
RBC: 3.88 MIL/uL (ref 3.87–5.11)
RDW: 13.6 % (ref 11.5–15.5)
RDW: 13.7 % (ref 11.5–15.5)
WBC: 2.5 10*3/uL — ABNORMAL LOW (ref 4.0–10.5)
WBC: 2.5 10*3/uL — ABNORMAL LOW (ref 4.0–10.5)
nRBC: 0 % (ref 0.0–0.2)
nRBC: 0 % (ref 0.0–0.2)

## 2019-10-12 LAB — BPAM PLATELET PHERESIS
Blood Product Expiration Date: 202010302359
ISSUE DATE / TIME: 202010281011
Unit Type and Rh: 6200

## 2019-10-12 LAB — PREPARE PLATELET PHERESIS: Unit division: 0

## 2019-10-12 LAB — VITAMIN B12: Vitamin B-12: 1009 pg/mL — ABNORMAL HIGH (ref 180–914)

## 2019-10-12 MED ORDER — FUROSEMIDE 20 MG PO TABS: 20.0000 mg | ORAL_TABLET | Freq: Every day | ORAL | 11 refills | Status: DC

## 2019-10-12 MED ORDER — PANTOPRAZOLE SODIUM 40 MG PO TBEC: 40.0000 mg | DELAYED_RELEASE_TABLET | Freq: Two times a day (BID) | ORAL | 0 refills | Status: DC

## 2019-10-12 NOTE — Discharge Summary (Signed)
[facility] NAME: Brandi Bright    MR#:  [id number]  DATE OF BIRTH:  02-Dec-1971  DATE OF ADMISSION:  10/11/2019 ADMITTING PHYSICIAN: Christel Mormon, MD  DATE OF DISCHARGE: 10/12/2019  PRIMARY CARE PHYSICIAN: Cletis Athens, MD    ADMISSION DIAGNOSIS:  Gastrointestinal hemorrhage, unspecified gastrointestinal hemorrhage type [K92.2]  DISCHARGE DIAGNOSIS:  Active Problems:   GI bleeding   SECONDARY DIAGNOSIS:   Past Medical History:  Diagnosis Date  . Arthritis   . Back pain   . Gout   . Hypothyroidism   . MI, old   . Thyroid disease     HOSPITAL COURSE:  48 year old female with hepatitis C currently on treatment, decompensated cirrhosis with esophageal varices status post ligation at the end of September who presented to the ER with rectal bleeding.   1. Rectal bleeding:  EGD 10/29 shows Normal duodenal bulb and second portion of the  duodenum.  Portal hypertensive gastropathy. Treated with argon  plasma coagulation (APC). Non-bleeding large (>5 mm) esophageal varices.  Incompletely eradicated. Banded Repeat EGD in 4 weeks. HGB stable GI follow up with Dr Laureen Abrahams Continue Protonix  2.  Decompensated liver cirrhosis: Continue Xifaxan and lactulose. She will continue Aldactone and LAsix, consider nadolol outpatient basis. Low platelets due to liver cirrhosis/hepatitis C  3.  Hypothyroidism: Continue Synthroid  4.  Hepatitis C currently on treatment  5.  Depression: Continue Effexor   DISCHARGE CONDITIONS AND DIET:  Stable regular diet  CONSULTS OBTAINED:  Treatment Team:  Lin Landsman, MD  DRUG ALLERGIES:   Allergies  Allergen Reactions  . Vicodin [Hydrocodone-Acetaminophen] Rash  . Amoxicillin Rash and Other (See Comments)    Has patient had a PCN reaction causing immediate rash,  facial/tongue/throat swelling, SOB or lightheadedness with hypotension: Yes Has patient had a PCN reaction causing severe rash involving mucus membranes or skin necrosis: No Has patient had a PCN reaction that required hospitalization: No Has patient had a PCN reaction occurring within the last 10 years: No If all of the above answers are "NO", then may proceed with Cephalosporin use.   . Gabapentin Rash    DISCHARGE MEDICATIONS:   Allergies as of 10/12/2019      Reactions   Vicodin [hydrocodone-acetaminophen] Rash   Amoxicillin Rash, Other (See Comments)   Has patient had a PCN reaction causing immediate rash, facial/tongue/throat swelling, SOB or lightheadedness with hypotension: Yes Has patient had a PCN reaction causing severe rash involving mucus membranes or skin necrosis: No Has patient had a PCN reaction that required hospitalization: No Has patient had a PCN reaction occurring within the last 10 years: No If all of the above answers are "NO", then may proceed with Cephalosporin use.   Gabapentin Rash      Medication List    STOP taking these medications   diclofenac sodium 1 % Gel Commonly known as: VOLTAREN   famotidine 20 MG tablet Commonly known as: PEPCID   meloxicam 7.5 MG tablet Commonly known as: MOBIC   oxyCODONE-acetaminophen 5-325 MG tablet Commonly known as: PERCOCET/ROXICET     TAKE these medications   [employer] VITAMIN B12 1000 MCG tablet Generic drug: cyanocobalamin TAKE 1 TABLET BY MOUTH EVERY DAY   furosemide 20 MG tablet Commonly known as: Lasix Take 1 tablet (20 mg total) by mouth daily.   lactulose 10 GM/15ML solution Commonly known as: CHRONULAC TAKE 15 MLS (10 G TOTAL) BY MOUTH DAILY FOR 30 DAYS.   levothyroxine  200 MCG tablet Commonly known as: SYNTHROID Take 200 mcg by mouth daily.   nitroGLYCERIN 0.4 MG/SPRAY spray Commonly known as: NITROLINGUAL Place 1 spray under the tongue every 5 (five) minutes x 3 doses as needed for chest  pain.   pantoprazole 40 MG tablet Commonly known as: Protonix Take 1 tablet (40 mg total) by mouth 2 (two) times daily for 14 days.   rifaximin 550 MG Tabs tablet Commonly known as: XIFAXAN Take 1 tablet (550 mg total) by mouth 2 (two) times daily.   Sofosbuvir-Velpatasvir 400-100 MG Tabs Commonly known as: Epclusa Take 1 tablet by mouth daily.   spironolactone 50 MG tablet Commonly known as: ALDACTONE Take 50 mg by mouth daily.   venlafaxine XR 75 MG 24 hr capsule Commonly known as: EFFEXOR-XR Take 75 mg by mouth daily.   Vitron-C 65-125 MG Tabs Generic drug: Iron-Vitamin C Take 1 tablet by mouth daily.         Today   CHIEF COMPLAINT:  Doing well this am   VITAL SIGNS:  Blood pressure 121/77, pulse 62, temperature 97.6 F (36.4 C), temperature source Oral, resp. rate 18, height 4\' 9"  (1.448 m), weight 80.3 kg, SpO2 92 %.   REVIEW OF SYSTEMS:  Review of Systems  Constitutional: Negative.  Negative for chills, fever and malaise/fatigue.  HENT: Negative.  Negative for ear discharge, ear pain, hearing loss, nosebleeds and sore throat.   Eyes: Negative.  Negative for blurred vision and pain.  Respiratory: Negative.  Negative for cough, hemoptysis, shortness of breath and wheezing.   Cardiovascular: Negative.  Negative for chest pain, palpitations and leg swelling.  Gastrointestinal: Negative.  Negative for abdominal pain, blood in stool, diarrhea, nausea and vomiting.  Genitourinary: Negative.  Negative for dysuria.  Musculoskeletal: Negative.  Negative for back pain.  Skin: Negative.   Neurological: Negative for dizziness, tremors, speech change, focal weakness, seizures and headaches.  Endo/Heme/Allergies: Negative.  Does not bruise/bleed easily.  Psychiatric/Behavioral: Negative.  Negative for depression, hallucinations and suicidal ideas.     PHYSICAL EXAMINATION:  GENERAL:  48 y.o.-year-old patient lying in the bed with no acute distress.  NECK:   Supple, no jugular venous distention. No thyroid enlargement, no tenderness.  LUNGS: Normal breath sounds bilaterally, no wheezing, rales,rhonchi  No use of accessory muscles of respiration.  CARDIOVASCULAR: S1, S2 normal. No murmurs, rubs, or gallops.  ABDOMEN: Soft, non-tender, non-distended. Bowel sounds present. No organomegaly or mass.  EXTREMITIES: No pedal edema, cyanosis, or clubbing.  PSYCHIATRIC: The patient is alert and oriented x 3.  SKIN: No obvious rash, lesion, or ulcer.   DATA REVIEW:   CBC Recent Labs  Lab 10/12/19 0913  WBC 2.5*  HGB 11.4*  HCT 35.4*  PLT 37*    Chemistries  Recent Labs  Lab 10/10/19 2256 10/11/19 0348  NA 141 141  K 3.5 3.4*  CL 109 110  CO2 25 25  GLUCOSE 116* 119*  BUN <5* 5*  CREATININE 0.51 0.53  CALCIUM 8.7* 8.5*  AST 45*  --   ALT 23  --   ALKPHOS 121  --   BILITOT 1.4*  --     Cardiac Enzymes No results for input(s): TROPONINI in the last 168 hours.  Microbiology Results  @MICRORSLT48 @  RADIOLOGY:  No results found.    Allergies as of 10/12/2019      Reactions   Vicodin [hydrocodone-acetaminophen] Rash   Amoxicillin Rash, Other (See Comments)   Has patient had a PCN reaction causing immediate rash,  facial/tongue/throat swelling, SOB or lightheadedness with hypotension: Yes Has patient had a PCN reaction causing severe rash involving mucus membranes or skin necrosis: No Has patient had a PCN reaction that required hospitalization: No Has patient had a PCN reaction occurring within the last 10 years: No If all of the above answers are "NO", then may proceed with Cephalosporin use.   Gabapentin Rash      Medication List    STOP taking these medications   diclofenac sodium 1 % Gel Commonly known as: VOLTAREN   famotidine 20 MG tablet Commonly known as: PEPCID   meloxicam 7.5 MG tablet Commonly known as: MOBIC   oxyCODONE-acetaminophen 5-325 MG tablet Commonly known as: PERCOCET/ROXICET     TAKE these  medications   CVS VITAMIN B12 1000 MCG tablet Generic drug: cyanocobalamin TAKE 1 TABLET BY MOUTH EVERY DAY   furosemide 20 MG tablet Commonly known as: Lasix Take 1 tablet (20 mg total) by mouth daily.   lactulose 10 GM/15ML solution Commonly known as: CHRONULAC TAKE 15 MLS (10 G TOTAL) BY MOUTH DAILY FOR 30 DAYS.   levothyroxine 200 MCG tablet Commonly known as: SYNTHROID Take 200 mcg by mouth daily.   nitroGLYCERIN 0.4 MG/SPRAY spray Commonly known as: NITROLINGUAL Place 1 spray under the tongue every 5 (five) minutes x 3 doses as needed for chest pain.   pantoprazole 40 MG tablet Commonly known as: Protonix Take 1 tablet (40 mg total) by mouth 2 (two) times daily for 14 days.   rifaximin 550 MG Tabs tablet Commonly known as: XIFAXAN Take 1 tablet (550 mg total) by mouth 2 (two) times daily.   Sofosbuvir-Velpatasvir 400-100 MG Tabs Commonly known as: Epclusa Take 1 tablet by mouth daily.   spironolactone 50 MG tablet Commonly known as: ALDACTONE Take 50 mg by mouth daily.   venlafaxine XR 75 MG 24 hr capsule Commonly known as: EFFEXOR-XR Take 75 mg by mouth daily.   Vitron-C 65-125 MG Tabs Generic drug: Iron-Vitamin C Take 1 tablet by mouth daily.           Management plans discussed with the patient and she is in agreement. Stable for discharge home  Patient should follow up with GI  CODE STATUS:     Code Status Orders  (From admission, onward)         Start     Ordered   10/11/19 0324  Full code  Continuous     10/11/19 0330        Code Status History    Date Active Date Inactive Code Status Order ID Comments User Context   06/24/2019 1105 06/26/2019 1643 Full Code [id number]  Vaughan Basta, MD Inpatient   12/29/2017 0539 12/29/2017 2005 Full Code [id number]  Harrie Foreman, MD Inpatient   Advance Care Planning Activity      TOTAL TIME TAKING CARE OF THIS PATIENT: 39 minutes.    Note: This dictation was prepared with Dragon  dictation along with smaller phrase technology. Any transcriptional errors that result from this process are unintentional.  Bettey Costa M.D on 10/12/2019 at 10:52 AM  Between 7am to 6pm - Pager - [phone] After 6pm go to [email or web address] - password [email or web address] [facility]  Office  [phone]  CC: Primary care physician; Cletis Athens, MD

## 2019-10-12 NOTE — Progress Notes (Signed)
Cephas Darby, MD [address]  [place]  [place], [place] [address]  Main: [phone]  Fax: [phone] Pager: [phone]   Subjective: No acute events overnight.  Patient underwent EGD yesterday with variceal ligation and treatment of bleeding portal hypertensive gastropathy.  No active bleeding at this time, hemoglobin has been stable Patient complains of sinus headache, feeling of fullness in her ears  Objective: Vital signs in last 24 hours: Vitals:   10/11/19 1952 10/12/19 0454 10/12/19 0858 10/12/19 1510  BP: (!) 153/85 (!) 150/89 121/77 133/80  Pulse: (!) 59 (!) 54 62 (!) 54  Resp: 18 18 18 20   Temp: 98.1 F (36.7 C) 98 F (36.7 C) 97.6 F (36.4 C) 98 F (36.7 C)  TempSrc: Oral Oral Oral   SpO2: 96% 96% 92% 93%  Weight:      Height:       Weight change: 0 kg  Intake/Output Summary (Last 24 hours) at 10/12/2019 1650 Last data filed at 10/12/2019 1300 Gross per 24 hour  Intake 1462.3 ml  Output -  Net 1462.3 ml     Exam: Heart:: Regular rate and rhythm, S1S2 present or without murmur or extra heart sounds Lungs: normal and clear to auscultation Abdomen: soft, nontender, normal bowel sounds   Lab Results: CBC Latest Ref Rng & Units 10/12/2019 10/12/2019 10/11/2019  WBC 4.0 - 10.5 K/uL 2.5(L) 2.5(L) 2.7(L)  Hemoglobin 12.0 - 15.0 g/dL 11.4(L) 11.4(L) 11.5(L)  Hematocrit 36.0 - 46.0 % 35.4(L) 34.6(L) 34.7(L)  Platelets 150 - 400 K/uL 37(L) 37(L) 41(L)   CMP Latest Ref Rng & Units 10/11/2019 10/10/2019 06/28/2019  Glucose 70 - 99 mg/dL 119(H) 116(H) CANCELED  BUN 6 - 20 mg/dL 5(L) <5(L) CANCELED  Creatinine 0.44 - 1.00 mg/dL 0.53 0.51 CANCELED  Sodium 135 - 145 mmol/L 141 141 CANCELED  Potassium 3.5 - 5.1 mmol/L 3.4(L) 3.5 CANCELED  Chloride 98 - 111 mmol/L 110 109 CANCELED  CO2 22 - 32 mmol/L 25 25 CANCELED  Calcium 8.9 - 10.3 mg/dL 8.5(L) 8.7(L) CANCELED  Total Protein 6.5 - 8.1 g/dL - 7.4 CANCELED  Total Bilirubin 0.3 - 1.2 mg/dL -  1.4(H) CANCELED  Alkaline Phos 38 - 126 U/L - 121 CANCELED  AST 15 - 41 U/L - 45(H) CANCELED  ALT 0 - 44 U/L - 23 CANCELED    Micro Results: Recent Results (from the past 240 hour(s))  SARS Coronavirus 2 by RT PCR (hospital order, performed in [facility] hospital lab) Nasopharyngeal Nasopharyngeal Swab     Status: None   Collection Time: 10/11/19  7:52 AM   Specimen: Nasopharyngeal Swab  Result Value Ref Range Status   SARS Coronavirus 2 NEGATIVE NEGATIVE Final    Comment: (NOTE) If result is NEGATIVE SARS-CoV-2 target nucleic acids are NOT DETECTED. The SARS-CoV-2 RNA is generally detectable in upper and lower  respiratory specimens during the acute phase of infection. The lowest  concentration of SARS-CoV-2 viral copies this assay can detect is 250  copies / mL. A negative result does not preclude SARS-CoV-2 infection  and should not be used as the sole basis for treatment or other  patient management decisions.  A negative result may occur with  improper specimen collection / handling, submission of specimen other  than nasopharyngeal swab, presence of viral mutation(s) within the  areas targeted by this assay, and inadequate number of viral copies  (<250 copies / mL). A negative result must be combined with clinical  observations, patient history, and  epidemiological information. If result is POSITIVE SARS-CoV-2 target nucleic acids are DETECTED. The SARS-CoV-2 RNA is generally detectable in upper and lower  respiratory specimens dur ing the acute phase of infection.  Positive  results are indicative of active infection with SARS-CoV-2.  Clinical  correlation with patient history and other diagnostic information is  necessary to determine patient infection status.  Positive results do  not rule out bacterial infection or co-infection with other viruses. If result is PRESUMPTIVE POSTIVE SARS-CoV-2 nucleic acids MAY BE PRESENT.   A presumptive positive result was obtained on  the submitted specimen  and confirmed on repeat testing.  While 2019 novel coronavirus  (SARS-CoV-2) nucleic acids may be present in the submitted sample  additional confirmatory testing may be necessary for epidemiological  and / or clinical management purposes  to differentiate between  SARS-CoV-2 and other Sarbecovirus currently known to infect humans.  If clinically indicated additional testing with an alternate test  methodology [id number]) is advised. The SARS-CoV-2 RNA is generally  detectable in upper and lower respiratory sp ecimens during the acute  phase of infection. The expected result is Negative. Fact Sheet for Patients:  StrictlyIdeas.no Fact Sheet for Healthcare Providers: BankingDealers.co.za This test is not yet approved or cleared by the [place] FDA and has been authorized for detection and/or diagnosis of SARS-CoV-2 by FDA under an Emergency Use Authorization (EUA).  This EUA will remain in effect (meaning this test can be used) for the duration of the COVID-19 declaration under Section 564(b)(1) of the Act, 21 U.S.C. section 360bbb-3(b)(1), unless the authorization is terminated or revoked sooner. Performed at [facility], [address], [place], [place] [address]    Studies/Results: No results found. Medications:  I have reviewed the patient's current medications. Prior to Admission:  Medications Prior to Admission  Medication Sig Dispense Refill Last Dose  . CVS VITAMIN B12 1000 MCG tablet TAKE 1 TABLET BY MOUTH EVERY DAY 90 tablet 1 10/10/2019 at Unknown time  . diclofenac sodium (VOLTAREN) 1 % GEL Apply 5 g topically 4 (four) times daily.   10/10/2019 at Unknown time  . famotidine (PEPCID) 20 MG tablet TAKE 1 TABLET BY MOUTH EVERY DAY 30 tablet 0 10/10/2019 at Unknown time  . Iron-Vitamin C (VITRON-C) 65-125 MG TABS Take 1 tablet by mouth daily. 30 tablet 3 10/10/2019 at Unknown time  .  lactulose (CHRONULAC) 10 GM/15ML solution TAKE 15 MLS (10 G TOTAL) BY MOUTH DAILY FOR 30 DAYS. 473 mL 3 10/10/2019 at Unknown time  . levothyroxine (SYNTHROID, LEVOTHROID) 200 MCG tablet Take 200 mcg by mouth daily.  8 10/10/2019 at Unknown time  . meloxicam (MOBIC) 7.5 MG tablet Take 7.5 mg by mouth 2 (two) times a day.   10/10/2019 at Unknown time  . nitroGLYCERIN (NITROLINGUAL) 0.4 MG/SPRAY spray Place 1 spray under the tongue every 5 (five) minutes x 3 doses as needed for chest pain.   prn at prn  . oxyCODONE-acetaminophen (PERCOCET/ROXICET) 5-325 MG tablet Take 1 tablet by mouth 4 (four) times daily.    Past Week at Unknown time  . rifaximin (XIFAXAN) 550 MG TABS tablet Take 1 tablet (550 mg total) by mouth 2 (two) times daily. 120 tablet 0 10/10/2019 at Unknown time  . Sofosbuvir-Velpatasvir (EPCLUSA) 400-100 MG TABS Take 1 tablet by mouth daily. 28 tablet 2 10/10/2019 at Unknown time  . spironolactone (ALDACTONE) 50 MG tablet Take 50 mg by mouth daily.  5 10/10/2019 at Unknown time  . venlafaxine XR (EFFEXOR-XR) 75  MG 24 hr capsule Take 75 mg by mouth daily.   4 10/10/2019 at Unknown time   Scheduled: . famotidine  20 mg Oral Daily  . ferrous sulfate  325 mg Oral Q breakfast  . lactulose  10 g Oral Daily  . levothyroxine  200 mcg Oral Daily  . oxyCODONE-acetaminophen  1 tablet Oral QID  . [START ON 10/14/2019] pantoprazole  40 mg Intravenous Q12H  . rifaximin  550 mg Oral BID  . Sofosbuvir-Velpatasvir  1 tablet Oral Daily  . venlafaxine XR  75 mg Oral Daily  . cyanocobalamin  1,000 mcg Oral Daily  . vitamin C  250 mg Oral Q breakfast   Continuous: . sodium chloride Stopped (10/11/19 0827)  . octreotide  (SANDOSTATIN)    IV infusion 50 mcg/hr (10/12/19 1219)   [id number] **OR** acetaminophen, nitroGLYCERIN, ondansetron **OR** ondansetron (ZOFRAN) IV, traZODone Anti-infectives (From admission, onward)   Start     Dose/Rate Route Frequency Ordered Stop   10/11/19 1100   rifaximin (XIFAXAN) tablet 550 mg     550 mg Oral 2 times daily 10/11/19 0330     10/11/19 1000  Sofosbuvir-Velpatasvir 400-100 MG TABS 1 tablet     1 tablet Oral Daily 10/11/19 0330     10/11/19 0300  cefTRIAXone (ROCEPHIN) 1 g in sodium chloride 0.9 % 100 mL IVPB     1 g 200 mL/hr over 30 Minutes Intravenous  Once 10/11/19 0249 10/11/19 0402     Scheduled Meds: . famotidine  20 mg Oral Daily  . ferrous sulfate  325 mg Oral Q breakfast  . lactulose  10 g Oral Daily  . levothyroxine  200 mcg Oral Daily  . oxyCODONE-acetaminophen  1 tablet Oral QID  . [START ON 10/14/2019] pantoprazole  40 mg Intravenous Q12H  . rifaximin  550 mg Oral BID  . Sofosbuvir-Velpatasvir  1 tablet Oral Daily  . venlafaxine XR  75 mg Oral Daily  . cyanocobalamin  1,000 mcg Oral Daily  . vitamin C  250 mg Oral Q breakfast   Continuous Infusions: . sodium chloride Stopped (10/11/19 0827)  . octreotide  (SANDOSTATIN)    IV infusion 50 mcg/hr (10/12/19 1219)   PRN Meds:.acetaminophen **OR** acetaminophen, nitroGLYCERIN, ondansetron **OR** ondansetron (ZOFRAN) IV, traZODone   Assessment: Active Problems:   GI bleeding  S/p EGD 10/28 with ligation of esophageal varices and APC of bleeding portal hypertensive gastropathy  Plan: Cirrhosis of liver Continue Protonix 40 mg 1-2 times daily for 2 weeks for post banding ulcer prophylaxis Recommend EGD in 4 weeks for variceal surveillance and to assess need for more banding Continue rifaximin for hepatic encephalopathy Recommend Lasix 20 mg daily and spironolactone 50 mg daily for swelling of legs, low-sodium diet  Chronic hep C Continue Harvoni HCV RNA viral load sent  Patient to follow up with Dr. Bonna Gains in next 2 to 3 weeks Being discharged home today     LOS: 1 day   Brandi Bright 10/12/2019, 4:50 PM

## 2019-10-12 NOTE — Progress Notes (Signed)
[facility] NAME: Brandi Bright    MR#:  [id number]  DATE OF BIRTH:  1971/02/26  SUBJECTIVE:   Patient doing well this morning.  No abdominal pain.  No bloody bowel movements.  REVIEW OF SYSTEMS:    Review of Systems  Constitutional: Negative for fever, chills weight loss HENT: Negative for ear pain, nosebleeds, congestion, facial swelling, rhinorrhea, neck pain, neck stiffness and ear discharge.   Respiratory: Negative for cough, shortness of breath, wheezing  Cardiovascular: Negative for chest pain, palpitations and leg swelling.  Gastrointestinal: Negative for heartburn, abdominal pain, vomiting, diarrhea or consitpation Genitourinary: Negative for dysuria, urgency, frequency, hematuria Musculoskeletal: Negative for back pain or joint pain Neurological: Negative for dizziness, seizures, syncope, focal weakness,  numbness and headaches.  Hematological: Does not bruise/bleed easily.  Psychiatric/Behavioral: Negative for hallucinations, confusion, dysphoric mood    Tolerating Diet: yes      DRUG ALLERGIES:   Allergies  Allergen Reactions  . Vicodin [Hydrocodone-Acetaminophen] Rash  . Amoxicillin Rash and Other (See Comments)    Has patient had a PCN reaction causing immediate rash, facial/tongue/throat swelling, SOB or lightheadedness with hypotension: Yes Has patient had a PCN reaction causing severe rash involving mucus membranes or skin necrosis: No Has patient had a PCN reaction that required hospitalization: No Has patient had a PCN reaction occurring within the last 10 years: No If all of the above answers are "NO", then may proceed with Cephalosporin use.   . Gabapentin Rash    VITALS:  Blood pressure 121/77, pulse 62, temperature 97.6 F (36.4 C), temperature source Oral, resp. rate 18, height 4\' 9"  (1.448 m), weight 80.3 kg, SpO2 92 %.  PHYSICAL EXAMINATION:  Constitutional: Appears well-developed and  well-nourished. No distress. HENT: Normocephalic. Marland Kitchen Oropharynx is clear and moist.  Eyes: Conjunctivae and EOM are normal. PERRLA, no scleral icterus.  Neck: Normal ROM. Neck supple. No JVD. No tracheal deviation. CVS: RRR, S1/S2 +, no murmurs, no gallops, no carotid bruit.  Pulmonary: Effort and breath sounds normal, no stridor, rhonchi, wheezes, rales.  Abdominal: Soft. BS +,  no distension, tenderness, rebound or guarding.  Musculoskeletal: Normal range of motion. No edema and no tenderness.  Neuro: Alert. CN 2-12 grossly intact. No focal deficits. Skin: Skin is warm and dry. No rash noted. Psychiatric: Normal mood and affect.      LABORATORY PANEL:   CBC Recent Labs  Lab 10/12/19 0913  WBC 2.5*  HGB 11.4*  HCT 35.4*  PLT 37*   ------------------------------------------------------------------------------------------------------------------  Chemistries  Recent Labs  Lab 10/10/19 2256 10/11/19 0348  NA 141 141  K 3.5 3.4*  CL 109 110  CO2 25 25  GLUCOSE 116* 119*  BUN <5* 5*  CREATININE 0.51 0.53  CALCIUM 8.7* 8.5*  AST 45*  --   ALT 23  --   ALKPHOS 121  --   BILITOT 1.4*  --    ------------------------------------------------------------------------------------------------------------------  Cardiac Enzymes No results for input(s): TROPONINI in the last 168 hours. ------------------------------------------------------------------------------------------------------------------  RADIOLOGY:  No results found.   ASSESSMENT AND PLAN:   48 year old female with hepatitis C currently on treatment, decompensated cirrhosis with esophageal varices status post ligation at the end of September who presented to the ER with rectal bleeding.   1. Rectal bleeding:  EGD 10/29 shows Normal duodenal bulb and second portion of the  duodenum.                        Portal hypertensive gastropathy. Treated with argon                        plasma  coagulation (APC).                       Non-bleeding large (> 5 mm) esophageal varices.                        Incompletely eradicated. Banded Repeat EGD in 4 weeks. [place] stable. Continue octreotide Protonix  2.  Decompensated liver cirrhosis: Continue Xifaxan and lactulose Low platelets due to liver cirrhosis/hepatitis C 3.  Hypothyroidism: Continue Synthroid  4.  Hepatitis C currently on treatment  5.  Depression: Continue Effexor   Management plans discussed with the patient and she is in agreement.  CODE STATUS: full  TOTAL TIME TAKING CARE OF THIS PATIENT: 30 minutes.     POSSIBLE D/C 1-3 days, DEPENDING ON CLINICAL CONDITION.   Bettey Costa M.D on 10/12/2019 at 10:31 AM  Between 7am to 6pm - Pager - [phone] After 6pm go to [email or web address] - password [email or web address] [facility]  Office  [phone]  CC: Primary care physician; Cletis Athens, MD  Note: This dictation was prepared with Dragon dictation along with smaller phrase technology. Any transcriptional errors that result from this process are unintentional.

## 2019-10-14 LAB — HCV RNA BY PCR, QN RFX GENO
HCV RNA Qnt(log copy/mL): UNDETERMINED log10 IU/mL
HepC Qn: NOT DETECTED IU/mL

## 2019-10-16 ENCOUNTER — Telehealth: Payer: Self-pay

## 2019-10-16 NOTE — Telephone Encounter (Signed)
Per Dr. Bonna Gains: Telephone visit tomorrow afternoon.  Called and left a message for call back

## 2019-10-16 NOTE — Telephone Encounter (Signed)
Called and left a message for call back  

## 2019-10-16 NOTE — Telephone Encounter (Signed)
Patient is calling because patient was seen in the ED on 10/11/2019. Patient states since she has been out of the hospital on Thursday she has only eaten twice. She states every time she eats she feels like her food gets stuck at her boob area. Patient states she has had some abdominal pain. Patient denies any blood in stool. Patient has a appointment with you on 11/06/2019. Patient states she wants to know what she can do till her appointment. Patient states Dr. Marius Ditch said she put a band in her esophagus and did something in her stomach

## 2019-10-17 ENCOUNTER — Other Ambulatory Visit: Payer: Self-pay

## 2019-10-17 ENCOUNTER — Encounter: Payer: Self-pay | Admitting: Gastroenterology

## 2019-10-17 ENCOUNTER — Ambulatory Visit (INDEPENDENT_AMBULATORY_CARE_PROVIDER_SITE_OTHER): Payer: Medicaid Other | Admitting: Gastroenterology

## 2019-10-17 DIAGNOSIS — K7460 Unspecified cirrhosis of liver: Secondary | ICD-10-CM

## 2019-10-17 DIAGNOSIS — B182 Chronic viral hepatitis C: Secondary | ICD-10-CM | POA: Diagnosis not present

## 2019-10-17 DIAGNOSIS — R1310 Dysphagia, unspecified: Secondary | ICD-10-CM

## 2019-10-17 MED ORDER — PANTOPRAZOLE SODIUM 40 MG PO TBEC: 40.0000 mg | DELAYED_RELEASE_TABLET | Freq: Two times a day (BID) | ORAL | 0 refills | Status: DC

## 2019-10-17 NOTE — Telephone Encounter (Signed)
Called and left a message for call back  

## 2019-10-17 NOTE — Progress Notes (Signed)
Brandi Antigua, MD [address]  [place]  [place], [place] [address]  Main: [phone]  Fax: [phone]   Primary Care Physician: Cletis Athens, MD  Virtual Visit via Telephone Note  I connected with patient on 10/17/19 at  1:30 PM EST by telephone and verified that I am speaking with the correct person using two identifiers.   I discussed the limitations, risks, security and privacy concerns of performing an evaluation and management service by telephone and the availability of in person appointments. I also discussed with the patient that there may be a patient responsible charge related to this service. The patient expressed understanding and agreed to proceed.  Location of Patient: Home Location of Provider: Home Persons involved: Patient and provider only during the visit (nursing staff and front desk staff was involved in communicating with the patient prior to the appointment, reviewing medications and checking them in)   History of Present Illness: Chief Complaint  Patient presents with  . Hospitalization Follow-up    Patient was seen in the hospital. Patient states everytime she eats it feels like it gets stuck in her boob area. Patient has nausea and does not feel like eating      HPI: Brandi Bright is a 48 y.o. 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  Procedure was done by Dr. Marius Ditch and 1 band placed and portal hypertensive gastropathy treated with cauterization.  Patient reporting some pain with swallowing since the procedure.  No nausea or vomiting, no further bleeding.  No confusion.  Is taking her meds without difficulty.  Is able to swallow liquids without difficulty.  Is able to swallow soft foods such as mashed potatoes without difficulty.  Current Outpatient Medications  Medication Sig Dispense Refill  . [facility] VITAMIN B12 1000 MCG tablet TAKE 1 TABLET BY MOUTH EVERY DAY 90  tablet 1  . furosemide (LASIX) 20 MG tablet Take 1 tablet (20 mg total) by mouth daily. 30 tablet 11  . Iron-Vitamin C (VITRON-C) 65-125 MG TABS Take 1 tablet by mouth daily. 30 tablet 3  . lactulose (CHRONULAC) 10 GM/15ML solution TAKE 15 MLS (10 G TOTAL) BY MOUTH DAILY FOR 30 DAYS. 473 mL 3  . levothyroxine (SYNTHROID, LEVOTHROID) 200 MCG tablet Take 200 mcg by mouth daily.  8  . nitroGLYCERIN (NITROLINGUAL) 0.4 MG/SPRAY spray Place 1 spray under the tongue every 5 (five) minutes x 3 doses as needed for chest pain.    . pantoprazole (PROTONIX) 40 MG tablet Take 1 tablet (40 mg total) by mouth 2 (two) times daily for 14 days. 28 tablet 0  . rifaximin (XIFAXAN) 550 MG TABS tablet Take 1 tablet (550 mg total) by mouth 2 (two) times daily. 120 tablet 0  . Sofosbuvir-Velpatasvir (EPCLUSA) 400-100 MG TABS Take 1 tablet by mouth daily. 28 tablet 2  . spironolactone (ALDACTONE) 50 MG tablet Take 50 mg by mouth daily.  5  . venlafaxine XR (EFFEXOR-XR) 75 MG 24 hr capsule Take 75 mg by mouth daily.   4  . pantoprazole (PROTONIX) 40 MG tablet Take 1 tablet (40 mg total) by mouth 2 (two) times daily. 60 tablet 0   No current facility-administered medications for this visit.     Allergies as of 10/17/2019 - Review Complete 10/17/2019  Allergen Reaction Noted  . Vicodin [hydrocodone-acetaminophen] Rash 01/11/2016  . Amoxicillin Rash and Other (See Comments) 01/11/2016  . Gabapentin Rash 01/11/2016    Review of  Systems:    All systems reviewed and negative except where noted in HPI.   Observations/Objective:  Labs: CMP     Component Value Date/Time   NA 141 10/11/2019 0348   NA CANCELED 06/28/2019 1515   NA 137 08/25/2013 0617   K 3.4 (L) 10/11/2019 0348   K 4.0 08/25/2013 0617   CL 110 10/11/2019 0348   CL 104 08/25/2013 0617   CO2 25 10/11/2019 0348   CO2 29 08/25/2013 0617   GLUCOSE 119 (H) 10/11/2019 0348   GLUCOSE 97 08/25/2013 0617   BUN 5 (L) 10/11/2019 0348   BUN CANCELED  06/28/2019 1515   BUN 9 08/25/2013 0617   CREATININE 0.53 10/11/2019 0348   CREATININE 0.68 08/25/2013 0617   CALCIUM 8.5 (L) 10/11/2019 0348   CALCIUM 8.8 08/25/2013 0617   PROT 7.4 10/10/2019 2256   PROT CANCELED 06/28/2019 1515   PROT 8.8 (H) 05/30/2013 1242   ALBUMIN 3.3 (L) 10/10/2019 2256   ALBUMIN CANCELED 06/28/2019 1515   ALBUMIN 4.4 05/30/2013 1242   AST 45 (H) 10/10/2019 2256   AST 163 (H) 05/30/2013 1242   ALT 23 10/10/2019 2256   ALT 144 (H) 05/30/2013 1242   ALKPHOS 121 10/10/2019 2256   ALKPHOS 89 05/30/2013 1242   BILITOT 1.4 (H) 10/10/2019 2256   BILITOT CANCELED 06/28/2019 1515   BILITOT 0.8 05/30/2013 1242   GFRNONAA >60 10/11/2019 0348   GFRNONAA >60 08/25/2013 0617   GFRAA >60 10/11/2019 0348   GFRAA >60 08/25/2013 0617   Lab Results  Component Value Date   WBC 2.5 (L) 10/12/2019   HGB 11.4 (L) 10/12/2019   HCT 35.4 (L) 10/12/2019   MCV 91.2 10/12/2019   PLT 37 (L) 10/12/2019    Imaging Studies: No results found.  Assessment and Plan:   Brandi Bright is a 48 y.o. 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, varices, status post banding, portal hypertensive gastropathy, reporting odynophagia since variceal banding recently  Assessment and Plan: Her symptoms may be due to the band in place, or possible ulceration forming at the site of the band placement as is expected after band placement  She is only taking her Protonix once daily and I have asked her to increase this to twice daily to help treat any inflammation or ulceration at the site of the band placement  I have asked her to eat mashed potato consistency or pured diet for 1 week and then advance diet as tolerated after that  She will need repeat EGD in the next 3 to 4 weeks to reassess the site  If symptoms do not improve I have asked her to contact us again she verbalized understanding  She is completely alert and oriented without any confusion denies any confusion since hospital stay.  No  bleeding since hospital stay  Follow Up Instructions: Follow-up in 2 weeks   I discussed the assessment and treatment plan with the patient. The patient was provided an opportunity to ask questions and all were answered. The patient agreed with the plan and demonstrated an understanding of the instructions.   The patient was advised to call back or seek an in-person evaluation if the symptoms worsen or if the condition fails to improve as anticipated.  I provided 13 minutes of non-face-to-face time during this encounter. Additional time was spent in reviewing patient's chart, placing orders etc.   Virgel Manifold, MD  Speech recognition software was used to dictate this note.

## 2019-10-17 NOTE — Telephone Encounter (Signed)
Made appointment today at 1:30

## 2019-10-17 NOTE — Patient Instructions (Signed)
Dysphagia Eating Plan, Pureed This diet is helpful for people with moderate to severe swallowing problems. Pureed foods are smooth and are prepared without lumps so that they can be swallowed safely. Work with your health care provider and your diet and nutrition specialist (dietitian) to make sure that you are following the diet safely and getting all the nutrients you need. What are tips for following this plan? General instructions  You may eat foods that are soft and have a pudding-like texture.  Do not eat foods that you have to chew. If you have to chew the food, then you cannot eat it.  Avoid foods that are hard, dry, sticky, chunky, lumpy, or stringy. Also avoid foods with nuts, seeds, raisins, skins, or pulp.  You may be instructed to thicken liquids. Follow your health care provider's instructions about how to do this and to what consistency. Cooking   If a food is not originally a smooth texture, you may be able to eat the food after: ? Pureeing it. This can be done with a blender. ? Moistening it. This can be done by adding juice, cooking liquid, gravy, or sauce to a dry food and then pureeing it. For example, you may have bread if you soak it in milk and puree it.  If a food is too thin, you may add a commercial thickener, corn starch, rice cereal, or potato flakes to thicken it.  Strain and throw away any liquid that separates from a solid pureed food before eating.  Strain lumps, chunks, pulp, and seeds from pureed foods before eating.  Reheat foods slowly to prevent a tough crust from forming. Meal planning  Eat a variety of foods to get all the nutrients you need.  Add dry milk or protein powder to food to increase calories and protein content.  Follow your meal plan as told by your dietitian. What foods are allowed? The items listed may not be a complete list. Talk with your dietitian about what dietary choices are best for you. Grains Soft breads, pancakes,  [place] toast, muffins, and bread stuffing pureed to a smooth, moist texture, without nuts or seeds. Cooked cereals that have a pudding-like consistency, such as cream of wheat or farina. Pureed oatmeal. Pureed, well-cooked pasta and rice. Vegetables Pureed vegetables. Smooth tomato paste or sauce. Mashed or pureed potatoes without skin. Fruits Pureed fruits such as melons and apples without seeds or pulp. Mashed bananas. Mashed avocado. Fruit juices without pulp or seeds. Meats and other protein foods Pureed meat, poultry, and fish. Smooth pate or liverwurst. Smooth souffles. Pureed beans (such as lentils). Pureed eggs. Smooth nut and seed butters. Pureed tofu. Dairy Yogurt. Milk. Pureed cottage cheese. Nutritional dairy drinks or shakes. Cream cheese. Smooth pudding, ice cream, sherbet, and malts. Fats and oils Butter. Margarine. Vegetable oils. Smooth and strained gravy. Sour cream. Mayonnaise. Smooth sauces such as white sauce, cheese sauce, or hollandaise sauce. Sweets and desserts Moistened and pureed cookies and cakes. Whipped topping. Gelatin. Pudding pops. Seasoning and other foods Finely ground spices. Jelly. Honey. Pureed casseroles. Strained soups. Pureed sandwiches. Beverages Anything prepared at the consistency recommended by your dietitian. What foods are not allowed? The items listed may not be a complete list. Talk with your dietitian about what dietary choices are best for you. Grains Oatmeal. Dry cereals. Hard breads. Breads with seeds or nuts. Whole pasta, rice, or other grains. Whole pancakes, waffles, biscuits, muffins, or rolls. Vegetables Whole vegetables. Stringy vegetables (such as celery). Tomatoes or tomato  sauce with seeds. Fried vegetables. °Fruits °Whole fresh, frozen, canned, or dried fruits that have not been pureed. Stringy fruits, such as pineapple or coconut. Watermelon with seeds. Dried fruit or fruit leather. °Meat and other protein foods °Whole or ground  meat, fish, or poultry. Dried or cooked lentils or legumes that have been cooked but not mashed or pureed. Non-pureed eggs. Nuts and seeds. Crunchy peanut butter. Whole tofu or other meat alternatives. °Dairy °Cheese cubes or slices. Non-pureed cottage cheese. Yogurt with fruit chunks. °Fats and oils °All fats and sauces that have lumps or chunks. °Sweets and desserts °Solid desserts. Sticky, chewy sweets (such as licorice and caramel). Candy with nuts or coconut. °Seasoning and other foods °Coarse or seeded herbs and spices. Chunky preserves. Jams with seeds. Whole sandwiches. Non-pureed casseroles. Chunky soups. °Summary °· Pureed foods can be helpful for people with moderate to severe swallowing problems. °· On the dysphagia eating plan, you may eat foods that are soft and have a pudding-like texture. You should avoid foods that you have to chew. If you have to chew the food, then you cannot eat it. °· You may be instructed to thicken liquids. Follow your health care provider's instructions about how to do this and to what consistency. °This information is not intended to replace advice given to you by your health care provider. Make sure you discuss any questions you have with your health care provider. °Document Released: 11/30/2005 Document Revised: 03/23/2019 Document Reviewed: 02/02/2017 °Elsevier Patient Education © 2020 [employer] Inc[employer] ° °

## 2019-11-06 ENCOUNTER — Other Ambulatory Visit: Payer: Self-pay

## 2019-11-06 ENCOUNTER — Encounter: Payer: Self-pay | Admitting: Gastroenterology

## 2019-11-06 ENCOUNTER — Ambulatory Visit (INDEPENDENT_AMBULATORY_CARE_PROVIDER_SITE_OTHER): Payer: Medicaid Other | Admitting: Gastroenterology

## 2019-11-06 VITALS — BP 153/91 | HR 78 | Temp 98.0°F | Wt 209.1 lb

## 2019-11-06 DIAGNOSIS — I8500 Esophageal varices without bleeding: Secondary | ICD-10-CM | POA: Diagnosis not present

## 2019-11-06 DIAGNOSIS — K7460 Unspecified cirrhosis of liver: Secondary | ICD-10-CM

## 2019-11-07 LAB — CBC
Hematocrit: 36.7 % (ref 34.0–46.6)
Hemoglobin: 12.4 g/dL (ref 11.1–15.9)
MCH: 29.7 pg (ref 26.6–33.0)
MCHC: 33.8 g/dL (ref 31.5–35.7)
MCV: 88 fL (ref 79–97)
Platelets: 38 10*3/uL — CL (ref 150–450)
RBC: 4.18 x10E6/uL (ref 3.77–5.28)
RDW: 14.9 % (ref 11.7–15.4)
WBC: 2.1 10*3/uL — CL (ref 3.4–10.8)

## 2019-11-07 LAB — COMPREHENSIVE METABOLIC PANEL
ALT: 23 IU/L (ref 0–32)
AST: 40 IU/L (ref 0–40)
Albumin/Globulin Ratio: 1.1 — ABNORMAL LOW (ref 1.2–2.2)
Albumin: 3.7 g/dL — ABNORMAL LOW (ref 3.8–4.8)
Alkaline Phosphatase: 134 IU/L — ABNORMAL HIGH (ref 39–117)
BUN/Creatinine Ratio: 13 (ref 9–23)
BUN: 8 mg/dL (ref 6–24)
Bilirubin Total: 1.1 mg/dL (ref 0.0–1.2)
CO2: 21 mmol/L (ref 20–29)
Calcium: 9.2 mg/dL (ref 8.7–10.2)
Chloride: 110 mmol/L — ABNORMAL HIGH (ref 96–106)
Creatinine, Ser: 0.62 mg/dL (ref 0.57–1.00)
GFR calc Af Amer: 122 mL/min/{1.73_m2} (ref >59)
GFR calc non Af Amer: 106 mL/min/{1.73_m2} (ref >59)
Globulin, Total: 3.5 g/dL (ref 1.5–4.5)
Glucose: 91 mg/dL (ref 65–99)
Potassium: 3.7 mmol/L (ref 3.5–5.2)
Sodium: 144 mmol/L (ref 134–144)
Total Protein: 7.2 g/dL (ref 6.0–8.5)

## 2019-11-07 LAB — PROTIME-INR
INR: 1.2 (ref 0.9–1.2)
Prothrombin Time: 13.2 s — ABNORMAL HIGH (ref 9.1–12.0)

## 2019-11-08 NOTE — Progress Notes (Signed)
Brandi Antigua, MD [address]  [place]  [place], [place] [address]  Main: [phone]  Fax: [phone]   Primary Care Physician: Cletis Athens, MD   Chief Complaint  Patient presents with  . Hepatitis C    no symptoms   . Dysphagia    Patient states she can not eat anything but soft foods if she doesnt food will get stuck in her throat     HPI: AMNEH Brandi Bright is a 48 y.o. female here for follow-up of cirrhosis.  Underwent variceal banding recently and has since reported dysphagia.  Feels that food gets stuck in her throat at times.  No episodes of bleeding or confusion.  No alcohol use.  Also portal hypertensive gastropathy seen on that EGD treated with cauterization.  Past Medical History:  Diagnosis Date  . Arthritis   . Back pain   . Gout   . Hypothyroidism   . MI, old   . Thyroid disease     Past social history: No alcohol smoking or drugs  Current Outpatient Medications  Medication Sig Dispense Refill  . [facility]S VITAMIN B12 1000 MCG tablet TAKE 1 TABLET BY MOUTH EVERY DAY 90 tablet 1  . furosemide (LASIX) 20 MG tablet Take 1 tablet (20 mg total) by mouth daily. 30 tablet 11  . Iron-Vitamin C (VITRON-C) 65-125 MG TABS Take 1 tablet by mouth daily. 30 tablet 3  . lactulose (CHRONULAC) 10 GM/15ML solution TAKE 15 MLS (10 G TOTAL) BY MOUTH DAILY FOR 30 DAYS. 473 mL 3  . levothyroxine (SYNTHROID, LEVOTHROID) 200 MCG tablet Take 200 mcg by mouth daily.  8  . nitroGLYCERIN (NITROLINGUAL) 0.4 MG/SPRAY spray Place 1 spray under the tongue every 5 (five) minutes x 3 doses as needed for chest pain.    . pantoprazole (PROTONIX) 40 MG tablet Take 1 tablet (40 mg total) by mouth 2 (two) times daily. 60 tablet 0  . Sofosbuvir-Velpatasvir (EPCLUSA) 400-100 MG TABS Take 1 tablet by mouth daily. 28 tablet 2  . spironolactone (ALDACTONE) 50 MG tablet Take 50 mg by mouth daily.  5  . venlafaxine XR (EFFEXOR-XR) 75 MG 24 hr capsule Take 75 mg by mouth daily.   4   No  current facility-administered medications for this visit.     Allergies as of 11/06/2019 - Review Complete 11/06/2019  Allergen Reaction Noted  . Vicodin [hydrocodone-acetaminophen] Rash 01/11/2016  . Amoxicillin Rash and Other (See Comments) 01/11/2016  . Gabapentin Rash 01/11/2016    ROS:  General: Negative for anorexia, weight loss, fever, chills, fatigue, weakness. ENT: Negative for hoarseness, difficulty swallowing , nasal congestion. CV: Negative for chest pain, angina, palpitations, dyspnea on exertion, peripheral edema.  Respiratory: Negative for dyspnea at rest, dyspnea on exertion, cough, sputum, wheezing.  GI: See history of present illness. GU:  Negative for dysuria, hematuria, urinary incontinence, urinary frequency, nocturnal urination.  Endo: Negative for unusual weight change.    Physical Examination:   BP (!) 153/91 (BP Location: Left Arm, Patient Position: Sitting, Cuff Size: Normal)   Pulse 78   Temp 98 F (36.7 C) (Oral)   Wt 209 lb 2 oz (94.9 kg)   LMP  (LMP Unknown) Comment: last menstrual 9 years ago  BMI 45.25 kg/m   General: Well-nourished, well-developed in no acute distress.  Eyes: No icterus. Conjunctivae pink. Mouth: Oropharyngeal mucosa moist and pink , no lesions erythema or exudate. Neck: Supple, Trachea midline Abdomen: Bowel sounds are normal, nontender, nondistended, no hepatosplenomegaly or  masses, no abdominal bruits or hernia , no rebound or guarding.   Extremities: No lower extremity edema. No clubbing or deformities. Neuro: Alert and oriented x 3.  Grossly intact. Skin: Warm and dry, no jaundice.   Psych: Alert and cooperative, normal mood and affect.   Labs: CMP     Component Value Date/Time   NA 144 11/06/2019 1418   NA 137 08/25/2013 0617   K 3.7 11/06/2019 1418   K 4.0 08/25/2013 0617   CL 110 (H) 11/06/2019 1418   CL 104 08/25/2013 0617   CO2 21 11/06/2019 1418   CO2 29 08/25/2013 0617   GLUCOSE 91 11/06/2019 1418    GLUCOSE 119 (H) 10/11/2019 0348   GLUCOSE 97 08/25/2013 0617   BUN 8 11/06/2019 1418   BUN 9 08/25/2013 0617   CREATININE 0.62 11/06/2019 1418   CREATININE 0.68 08/25/2013 0617   CALCIUM 9.2 11/06/2019 1418   CALCIUM 8.8 08/25/2013 0617   PROT 7.2 11/06/2019 1418   PROT 8.8 (H) 05/30/2013 1242   ALBUMIN 3.7 (L) 11/06/2019 1418   ALBUMIN 4.4 05/30/2013 1242   AST 40 11/06/2019 1418   AST 163 (H) 05/30/2013 1242   ALT 23 11/06/2019 1418   ALT 144 (H) 05/30/2013 1242   ALKPHOS 134 (H) 11/06/2019 1418   ALKPHOS 89 05/30/2013 1242   BILITOT 1.1 11/06/2019 1418   BILITOT 0.8 05/30/2013 1242   GFRNONAA 106 11/06/2019 1418   GFRNONAA >60 08/25/2013 0617   GFRAA 122 11/06/2019 1418   GFRAA >60 08/25/2013 0617   Lab Results  Component Value Date   WBC 2.1 (LL) 11/06/2019   HGB 12.4 11/06/2019   HCT 36.7 11/06/2019   MCV 88 11/06/2019   PLT 38 (LL) 11/06/2019    Imaging Studies: No results found.  Assessment and Plan:   Brandi Bright is a 48 y.o. y/o female here for follow-up of hep C cirrhosis  Continue Epclusa, on months 2 of her prescription at this time  We will repeat EGD to reevaluate varices and see if further banding is needed, and also evaluate her complaint of dysphagia that has been present since her last EGD and was not present before  However, given her low platelets, less than 50,000 we will try to coordinate with Dr. Tasia Catchings from hematology to see if platelet transfusion can be done at the cancer center prior to the procedure    Dr Brandi Bright

## 2019-11-13 ENCOUNTER — Inpatient Hospital Stay: Payer: Medicaid Other | Admitting: Oncology

## 2019-11-13 ENCOUNTER — Inpatient Hospital Stay: Payer: Medicaid Other

## 2019-11-16 ENCOUNTER — Other Ambulatory Visit: Payer: Self-pay

## 2019-11-16 ENCOUNTER — Other Ambulatory Visit
Admission: RE | Admit: 2019-11-16 | Discharge: 2019-11-16 | Disposition: A | Discharge status: Home / Self Care | Payer: Medicaid Other | Source: Ambulatory Visit | Attending: Gastroenterology | Admitting: Gastroenterology

## 2019-11-16 DIAGNOSIS — Z01812 Encounter for preprocedural laboratory examination: Secondary | ICD-10-CM | POA: Diagnosis not present

## 2019-11-16 DIAGNOSIS — Z20828 Contact with and (suspected) exposure to other viral communicable diseases: Secondary | ICD-10-CM | POA: Insufficient documentation

## 2019-11-17 LAB — SARS CORONAVIRUS 2 (TAT 6-24 HRS): SARS Coronavirus 2: NEGATIVE

## 2019-11-20 ENCOUNTER — Other Ambulatory Visit: Payer: Self-pay

## 2019-11-20 ENCOUNTER — Encounter
Admission: RE | Disposition: A | Discharge status: Home / Self Care | Payer: Self-pay | Source: Home / Self Care | Attending: Gastroenterology

## 2019-11-20 ENCOUNTER — Telehealth: Payer: Self-pay

## 2019-11-20 ENCOUNTER — Telehealth: Payer: Self-pay | Admitting: *Deleted

## 2019-11-20 ENCOUNTER — Ambulatory Visit: Payer: Medicaid Other | Admitting: Certified Registered Nurse Anesthetist

## 2019-11-20 ENCOUNTER — Encounter: Payer: Self-pay | Admitting: Certified Registered Nurse Anesthetist

## 2019-11-20 ENCOUNTER — Ambulatory Visit
Admission: RE | Admit: 2019-11-20 | Discharge: 2019-11-20 | Disposition: A | Discharge status: Home / Self Care | Payer: Medicaid Other | Attending: Gastroenterology | Admitting: Gastroenterology

## 2019-11-20 DIAGNOSIS — I8500 Esophageal varices without bleeding: Secondary | ICD-10-CM | POA: Diagnosis not present

## 2019-11-20 DIAGNOSIS — I2510 Atherosclerotic heart disease of native coronary artery without angina pectoris: Secondary | ICD-10-CM | POA: Diagnosis not present

## 2019-11-20 DIAGNOSIS — R1310 Dysphagia, unspecified: Secondary | ICD-10-CM | POA: Diagnosis not present

## 2019-11-20 DIAGNOSIS — K3189 Other diseases of stomach and duodenum: Secondary | ICD-10-CM | POA: Insufficient documentation

## 2019-11-20 DIAGNOSIS — D696 Thrombocytopenia, unspecified: Secondary | ICD-10-CM

## 2019-11-20 DIAGNOSIS — Z888 Allergy status to other drugs, medicaments and biological substances status: Secondary | ICD-10-CM | POA: Diagnosis not present

## 2019-11-20 DIAGNOSIS — F419 Anxiety disorder, unspecified: Secondary | ICD-10-CM | POA: Insufficient documentation

## 2019-11-20 DIAGNOSIS — D6959 Other secondary thrombocytopenia: Secondary | ICD-10-CM | POA: Diagnosis not present

## 2019-11-20 DIAGNOSIS — T182XXA Foreign body in stomach, initial encounter: Secondary | ICD-10-CM | POA: Diagnosis not present

## 2019-11-20 DIAGNOSIS — Z800 Family history of malignant neoplasm of digestive organs: Secondary | ICD-10-CM | POA: Diagnosis not present

## 2019-11-20 DIAGNOSIS — I8510 Secondary esophageal varices without bleeding: Secondary | ICD-10-CM | POA: Diagnosis not present

## 2019-11-20 DIAGNOSIS — Z79899 Other long term (current) drug therapy: Secondary | ICD-10-CM | POA: Diagnosis not present

## 2019-11-20 DIAGNOSIS — I252 Old myocardial infarction: Secondary | ICD-10-CM | POA: Diagnosis not present

## 2019-11-20 DIAGNOSIS — I8511 Secondary esophageal varices with bleeding: Secondary | ICD-10-CM | POA: Diagnosis not present

## 2019-11-20 DIAGNOSIS — Z6841 Body Mass Index (BMI) 40.0 and over, adult: Secondary | ICD-10-CM | POA: Diagnosis not present

## 2019-11-20 DIAGNOSIS — Z885 Allergy status to narcotic agent status: Secondary | ICD-10-CM | POA: Insufficient documentation

## 2019-11-20 DIAGNOSIS — E039 Hypothyroidism, unspecified: Secondary | ICD-10-CM | POA: Diagnosis not present

## 2019-11-20 DIAGNOSIS — Z8249 Family history of ischemic heart disease and other diseases of the circulatory system: Secondary | ICD-10-CM | POA: Insufficient documentation

## 2019-11-20 DIAGNOSIS — Z801 Family history of malignant neoplasm of trachea, bronchus and lung: Secondary | ICD-10-CM | POA: Diagnosis not present

## 2019-11-20 DIAGNOSIS — M1990 Unspecified osteoarthritis, unspecified site: Secondary | ICD-10-CM | POA: Diagnosis not present

## 2019-11-20 DIAGNOSIS — R0602 Shortness of breath: Secondary | ICD-10-CM | POA: Insufficient documentation

## 2019-11-20 DIAGNOSIS — I10 Essential (primary) hypertension: Secondary | ICD-10-CM | POA: Diagnosis not present

## 2019-11-20 DIAGNOSIS — M109 Gout, unspecified: Secondary | ICD-10-CM | POA: Insufficient documentation

## 2019-11-20 DIAGNOSIS — K766 Portal hypertension: Secondary | ICD-10-CM | POA: Insufficient documentation

## 2019-11-20 DIAGNOSIS — M549 Dorsalgia, unspecified: Secondary | ICD-10-CM | POA: Insufficient documentation

## 2019-11-20 DIAGNOSIS — D509 Iron deficiency anemia, unspecified: Secondary | ICD-10-CM

## 2019-11-20 DIAGNOSIS — F329 Major depressive disorder, single episode, unspecified: Secondary | ICD-10-CM | POA: Diagnosis not present

## 2019-11-20 DIAGNOSIS — E785 Hyperlipidemia, unspecified: Secondary | ICD-10-CM | POA: Diagnosis not present

## 2019-11-20 DIAGNOSIS — F418 Other specified anxiety disorders: Secondary | ICD-10-CM | POA: Diagnosis not present

## 2019-11-20 HISTORY — PX: ESOPHAGOGASTRODUODENOSCOPY (EGD) WITH PROPOFOL: SHX5813

## 2019-11-20 SURGERY — ESOPHAGOGASTRODUODENOSCOPY (EGD) WITH PROPOFOL
Anesthesia: General

## 2019-11-20 MED ORDER — LIDOCAINE HCL (CARDIAC) PF 100 MG/5ML IV SOSY
PREFILLED_SYRINGE | INTRAVENOUS | Status: DC | PRN
  Administered 2019-11-20: 50 mg via INTRAVENOUS

## 2019-11-20 MED ORDER — SODIUM CHLORIDE 0.9 % IV SOLN
INTRAVENOUS | Status: DC
  Administered 2019-11-20: 08:00:00 via INTRAVENOUS

## 2019-11-20 MED ORDER — PROPOFOL 10 MG/ML IV BOLUS
INTRAVENOUS | Status: DC | PRN
  Administered 2019-11-20: 60 mg via INTRAVENOUS
  Administered 2019-11-20: 20 mg via INTRAVENOUS

## 2019-11-20 MED ORDER — PROPOFOL 500 MG/50ML IV EMUL
INTRAVENOUS | Status: AC
  Filled 2019-11-20: qty 50

## 2019-11-20 MED ORDER — PROPOFOL 500 MG/50ML IV EMUL
INTRAVENOUS | Status: DC | PRN
  Administered 2019-11-20: 175 ug/kg/min via INTRAVENOUS

## 2019-11-20 MED ORDER — LIDOCAINE HCL URETHRAL/MUCOSAL 2 % EX GEL
CUTANEOUS | Status: AC
  Filled 2019-11-20: qty 5

## 2019-11-20 MED ORDER — PHENYLEPHRINE HCL (PRESSORS) 10 MG/ML IV SOLN
INTRAVENOUS | Status: AC
  Filled 2019-11-20: qty 1

## 2019-11-20 MED ORDER — EPHEDRINE SULFATE 50 MG/ML IJ SOLN
INTRAMUSCULAR | Status: AC
  Filled 2019-11-20: qty 1

## 2019-11-20 MED ORDER — GLYCOPYRROLATE 0.2 MG/ML IJ SOLN
INTRAMUSCULAR | Status: AC
  Filled 2019-11-20: qty 1

## 2019-11-20 NOTE — Telephone Encounter (Signed)
This pt needs a repeat EGD. her stomach was full of food today, so we couldnt do much and she needs a prolonged fast so we can do her. She is willingto do it tomorrow and Id rather do her at [facility] and penny said I can do that after my [place] cases. So what we would need to do is call the cancer center, she sees them, and has a virtual visit with them on wednesday. See if they can arrange platelet transfusion for her today or tomorrow morning with Dr. Tasia Catchings before her procedure. and then we can do her EGD tomorrow. If not then we would need to schedule for another day after cancer center can do platelet tranfusion and besides needing to be NPO past midnight the night before, she would need to be on clear liquids only all day the day before the procedure as well.  Called and left a message for call back Per Dr. Bonna Gains  ok i told her to stay on clear liquids today until she hears from you. Basically if cancer center can tell us when is the earliest they can arrange for platelet tranfusion for her we can schedule procedure accordingly. If they cant until wednesday, when they see her, we can either plan for the procedure on wednesday depending on when they can do the transfusion or thursday. With her being on clear liquids all day the day before the procedure and NPO past midnight

## 2019-11-20 NOTE — Telephone Encounter (Signed)
Called patient and informed patient she could eat soft foods today since I have not been able to talk to the cancer center. INfromed patient I would give her a call back when I find out when they can do the platelet transfusion.

## 2019-11-20 NOTE — Telephone Encounter (Signed)
Patient is scheduled for lab on 12/8 and MD/platelet transfusion on 12/9.

## 2019-11-20 NOTE — Anesthesia Postprocedure Evaluation (Signed)
Anesthesia Post Note  Patient: Brandi Bright  Procedure(s) Performed: ESOPHAGOGASTRODUODENOSCOPY (EGD) WITH PROPOFOL (N/A )  Patient location during evaluation: PACU Anesthesia Type: General Level of consciousness: awake and alert Pain management: pain level controlled Vital Signs Assessment: post-procedure vital signs reviewed and stable Respiratory status: spontaneous breathing, nonlabored ventilation and respiratory function stable Cardiovascular status: blood pressure returned to baseline and stable Postop Assessment: no apparent nausea or vomiting Anesthetic complications: no     Last Vitals:  Vitals:   11/20/19 0925 11/20/19 0935  BP: 99/62 106/72  Pulse:    Resp:    Temp:    SpO2:      Last Pain:  Vitals:   11/20/19 0935  TempSrc:   PainSc: 0-No pain                 Durenda Hurt

## 2019-11-20 NOTE — Telephone Encounter (Signed)
Informed patient that they would be setting patient up for a transfusion on Wednesday and we will do procedure on Thursday. Patient verbalized understanding and verbalized understanding of clear liquids all day on Wednesday and nothing after midnight

## 2019-11-20 NOTE — Op Note (Signed)
[facility]astroenterology Patient Name: Brandi Bright Procedure Date: 11/20/2019 8:57 AM MRN: [id number] Account #: [id number] Date of Birth: [birth year] Admit Type: Outpatient Age: 48 Room: [facility] ENDO ROOM 3 Gender: Female Note Status: Finalized Procedure:             Upper GI endoscopy Indications:           Follow-up of esophageal varices Providers:             Abiageal Blowe B. Maximino Greenland MD, MD Referring MD:          Corky Downs, MD (Referring MD) Medicines:             Monitored Anesthesia Care Complications:         No immediate complications. Procedure:             Pre-Anesthesia Assessment:                        - The risks and benefits of the procedure and the                         sedation options and risks were discussed with the                         patient. All questions were answered and informed                         consent was obtained.                        - Patient identification and proposed procedure were                         verified prior to the procedure.                        - ASA Grade Assessment: III - A patient with severe                         systemic disease.                        After obtaining informed consent, the endoscope was                         passed under direct vision. Throughout the procedure,                         the patient's blood pressure, pulse, and oxygen                         saturations were monitored continuously. The Endoscope                         was introduced through the mouth, and advanced to the                         second part of duodenum. The upper GI endoscopy was                         accomplished  with ease. The patient tolerated the                         procedure well. Findings:      Grade II varices were found in the distal esophagus. Banding was not       done due to large amount of food in the stomach to prevent aspiration.      Two columns of grade II varices with no  stigmata of recent bleeding were       found in the distal esophagus,. No red wale signs were present. Scarring       from prior treatment was visible. The varices appeared smaller than they       were at prior exam. Banding was not done due to large amount of food in       the stomach and to prevent aspiration. The scar site from previous       banding is causing a ring to form in the area, but without any       resistance to scope advancement. This could be leading to her complaint       of mild dysphagia but is not narrowed enough to need dilation. Future       bandings can be done away from this site.      Mild portal hypertensive gastropathy was found in the entire examined       stomach.      A large amount of food (residue) was found in the gastric body and in       the gastric antrum.      The examined duodenum was normal. Impression:            - Grade II esophageal varices.                        - Grade II esophageal varices with no stigmata of                         recent bleeding.                        - Portal hypertensive gastropathy.                        - A large amount of food (residue) in the stomach.                        - Normal examined duodenum.                        - No specimens collected. Recommendation:        - Discharge patient to home.                        - - Repeat upper endoscopy at the next available                         appointment for endoscopic band ligation. This is to                         be scheduled after prolonged fast to clear the stomach  and after platelet transfusion.                        - Continue present medications.                        - Return to my office as previously scheduled.                        - The findings and recommendations were discussed with                         the patient.                        - The findings and recommendations were discussed with                          the patient's family. Procedure Code(s):     --- Professional ---                        [phone], Esophagogastroduodenoscopy, flexible,                         transoral; diagnostic, including collection of                         specimen(s) by brushing or washing, when performed                         (separate procedure) Diagnosis Code(s):     --- Professional ---                        I85.00, Esophageal varices without bleeding                        K31.89, Other diseases of stomach and duodenum                        K76.6, Portal hypertension CPT copyright 2019 American Medical Association. All rights reserved. The codes documented in this report are preliminary and upon coder review may  be revised to meet current compliance requirements.  [name], MD [name] MD, MD 11/20/2019 9:30:12 AM This report has been signed electronically. Number of Addenda: 0 Note Initiated On: 11/20/2019 8:57 AM      [facility]

## 2019-11-20 NOTE — Anesthesia Post-op Follow-up Note (Signed)
Anesthesia QCDR form completed.        

## 2019-11-20 NOTE — H&P (Signed)
Brandi Antigua, MD [address] [place], Brandi [place], [place], [address] [id number], [place], [place], [place], [address] Phone: [phone]  Fax: [phone]  Primary Care Physician:  Brandi Athens, MD   Pre-Procedure History & Physical: HPI:  Brandi Bright is a 48 y.o. female is here for an EGD.   Past Medical History:  Diagnosis Date  . Arthritis   . Back pain   . Gout   . Hypothyroidism   . MI, old   . Thyroid disease     Past Surgical History:  Procedure Laterality Date  . CARDIAC CATHETERIZATION    . CESAREAN SECTION    . COLONOSCOPY WITH PROPOFOL N/A 03/16/2018   Procedure: COLONOSCOPY WITH PROPOFOL;  Surgeon: Brandi Manifold, MD;  Location: [facility] ENDOSCOPY;  Service: Endoscopy;  Laterality: N/A;  . ESOPHAGOGASTRODUODENOSCOPY N/A 10/11/2019   Procedure: ESOPHAGOGASTRODUODENOSCOPY (EGD);  Surgeon: Brandi Landsman, MD;  Location: [facility] ENDOSCOPY;  Service: Gastroenterology;  Laterality: N/A;  . ESOPHAGOGASTRODUODENOSCOPY (EGD) WITH PROPOFOL N/A 10/27/2018   Procedure: ESOPHAGOGASTRODUODENOSCOPY (EGD) WITH PROPOFOL;  Surgeon: Brandi Manifold, MD;  Location: [facility] ENDOSCOPY;  Service: Endoscopy;  Laterality: N/A;  . ESOPHAGOGASTRODUODENOSCOPY (EGD) WITH PROPOFOL N/A 06/25/2019   Procedure: ESOPHAGOGASTRODUODENOSCOPY (EGD) WITH PROPOFOL;  Surgeon: Brandi Bellows, MD;  Location: [facility] ENDOSCOPY;  Service: Gastroenterology;  Laterality: N/A;  . ESOPHAGOGASTRODUODENOSCOPY (EGD) WITH PROPOFOL N/A 09/12/2019   Procedure: ESOPHAGOGASTRODUODENOSCOPY (EGD) WITH PROPOFOL;  Surgeon: Brandi Lame, MD;  Location: [facility] ENDOSCOPY;  Service: Endoscopy;  Laterality: N/A;  . TUBAL LIGATION      Prior to Admission medications   Medication Sig Start Date End Date Taking? Authorizing Provider  furosemide (LASIX) 20 MG tablet Take 1 tablet (20 mg total) by mouth daily. 10/12/19 10/11/20 Yes Bright, Sital, MD  lactulose (CHRONULAC) 10 GM/15ML solution TAKE 15 MLS (10 G TOTAL) BY MOUTH  DAILY FOR 30 DAYS. 08/24/19  Yes Brandi Bright B, MD  levothyroxine (SYNTHROID, LEVOTHROID) 200 MCG tablet Take 200 mcg by mouth daily. 11/26/17  Yes [provider]  pantoprazole (PROTONIX) 40 MG tablet Take 1 tablet (40 mg total) by mouth 2 (two) times daily. 10/17/19  Yes Brandi Bright B, MD  spironolactone (ALDACTONE) 50 MG tablet Take 50 mg by mouth daily. 08/21/18  Yes [provider]  venlafaxine XR (EFFEXOR-XR) 75 MG 24 hr capsule Take 75 mg by mouth daily.  08/22/18  Yes [provider]  [employer] VITAMIN B12 1000 MCG tablet TAKE 1 TABLET BY MOUTH EVERY DAY 08/13/19   Brandi Server, MD  Iron-Vitamin C (VITRON-C) 65-125 MG TABS Take 1 tablet by mouth daily. 08/15/19   Brandi Server, MD  nitroGLYCERIN (NITROLINGUAL) 0.4 MG/SPRAY spray Place 1 spray under the tongue every 5 (five) minutes x 3 doses as needed for chest pain.    [provider]  Sofosbuvir-Velpatasvir (EPCLUSA) 400-100 MG TABS Take 1 tablet by mouth daily. 08/30/19   Brandi Manifold, MD    Allergies as of 11/06/2019 - Review Complete 11/06/2019  Allergen Reaction Noted  . Vicodin [hydrocodone-acetaminophen] Rash 01/11/2016  . Amoxicillin Rash and Other (See Comments) 01/11/2016  . Gabapentin Rash 01/11/2016    Family History  Problem Relation Age of Onset  . Hypertension Other   . CAD Father   . Colon cancer Father   . Lung cancer Father   . Pancreatic cancer Brother     Social History   Socioeconomic History  . Marital status: Single    Spouse name: Not on file  . Number of  children: Not on file  . Years of education: Not on file  . Highest education level: Not on file  Occupational History  . Not on file  Social Needs  . Financial resource strain: Somewhat hard  . Food insecurity    Worry: Never true    Inability: Never true  . Transportation needs    Medical: No    Non-medical: No  Tobacco Use  . Smoking status: Never Smoker  . Smokeless tobacco: Never Used  Substance  and Sexual Activity  . Alcohol use: Not Currently    Comment: has not had a drink in about 5 months  . Drug use: Never  . Sexual activity: Not Currently  Lifestyle  . Physical activity    Days per week: 0 days    Minutes per session: 0 min  . Stress: Rather much  Relationships  . Social connections    Talks on phone: More than three times a week    Gets together: More than three times a week    Attends religious service: Never    Active member of club or organization: No    Attends meetings of clubs or organizations: Not on file    Relationship status: Not on file  . Intimate partner violence    Fear of current or ex partner: No    Emotionally abused: No    Physically abused: No    Forced sexual activity: No  Other Topics Concern  . Not on file  Social History Narrative  . Not on file    Review of Systems: See HPI, otherwise negative ROS  Physical Exam: BP 136/88   Pulse 78   Temp (!) 96.8 F (36 C) (Temporal)   Resp 16   Ht 4\' 9"  (1.448 m)   Wt 85.3 kg   LMP  (LMP Unknown) Comment: last menstrual 9 years ago  SpO2 98%   BMI 40.68 kg/m  General:   Alert,  pleasant and cooperative in NAD Head:  Normocephalic and atraumatic. Neck:  Supple; no masses or thyromegaly. Lungs:  Clear throughout to auscultation, normal respiratory effort.    Heart:  +S1, +S2, Regular rate and rhythm, No edema. Abdomen:  Soft, nontender and nondistended. Normal bowel sounds, without guarding, and without rebound.   Neurologic:  Alert and  oriented x4;  grossly normal neurologically.  Impression/Plan: Brandi Bright is here for an EGD for variceal surveillance and dysphagia  Risks, benefits, limitations, and alternatives regarding the procedure have been reviewed with the patient.  Questions have been answered.  All parties agreeable.   Brandi Manifold, MD  11/20/2019, 8:56 AM

## 2019-11-20 NOTE — Transfer of Care (Signed)
Immediate Anesthesia Transfer of Care Note  Patient: Brandi Bright  Procedure(s) Performed: ESOPHAGOGASTRODUODENOSCOPY (EGD) WITH PROPOFOL (N/A )  Patient Location: PACU  Anesthesia Type:General  Level of Consciousness: drowsy  Airway & Oxygen Therapy: Patient Spontanous Breathing and Patient connected to nasal cannula oxygen  Post-op Assessment: Report given to RN and Post -op Vital signs reviewed and stable  Post vital signs: Reviewed and stable  Last Vitals:  Vitals Value Taken Time  BP 100/55 11/20/19 0917  Temp 35.8 C 11/20/19 0917  Pulse 82 11/20/19 0917  Resp 19 11/20/19 0917  SpO2 97 % 11/20/19 0917  Vitals shown include unvalidated device data.  Last Pain:  Vitals:   11/20/19 0917  TempSrc: Temporal         Complications: No apparent anesthesia complications

## 2019-11-20 NOTE — Telephone Encounter (Signed)
Brandi Bright with Dr Lorelle Formosa office called reporting that patients upper endoscopy could not be done last week due to her stomach being full of food and states that patient needs to come in for platelet transfusion today if possible or tomorrow or at least this week so that they can proceed with her procedure. Please arrange this

## 2019-11-20 NOTE — Anesthesia Preprocedure Evaluation (Addendum)
Anesthesia Evaluation  Patient identified by MRN, date of birth, ID band Patient awake    Reviewed: Allergy & Precautions, H&P , NPO status , Patient's Chart, lab work & pertinent test results  Airway Mallampati: III  TM Distance: >3 FB     Dental  (+) Chipped   Pulmonary shortness of breath and with exertion, neg sleep apnea, neg COPD, neg recent URI, Not current smoker,           Cardiovascular hypertension, + Past MI (MI x 2 in 2013, 2014)       Neuro/Psych PSYCHIATRIC DISORDERS Anxiety Depression negative neurological ROS     GI/Hepatic negative GI ROS, (+) Cirrhosis   Esophageal Varices    , Hepatitis -Varices s/p banding 10/11/19, now with dysphagia   Endo/Other  Hypothyroidism Morbid obesity (BMI 41)  Renal/GU negative Renal ROS  negative genitourinary   Musculoskeletal  (+) Arthritis ,   Abdominal   Peds  Hematology 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, platelets 41, essentially unchanged for past several months, no active bleed suspected   Anesthesia Other Findings Past Medical History: No date: Arthritis No date: Back pain No date: Gout No date: Hypothyroidism No date: MI, old No date: Thyroid disease  Past Surgical History: No date: CARDIAC CATHETERIZATION No date: CESAREAN SECTION 03/16/2018: COLONOSCOPY WITH PROPOFOL; N/A     Comment:  Procedure: COLONOSCOPY WITH PROPOFOL;  Surgeon:               Virgel Manifold, MD;  Location: [facility] ENDOSCOPY;                Service: Endoscopy;  Laterality: N/A; 10/11/2019: ESOPHAGOGASTRODUODENOSCOPY; N/A     Comment:  Procedure: ESOPHAGOGASTRODUODENOSCOPY (EGD);  Surgeon:               Lin Landsman, MD;  Location: [facility] ENDOSCOPY;                Service: Gastroenterology;  Laterality: N/A; 10/27/2018: ESOPHAGOGASTRODUODENOSCOPY (EGD) WITH PROPOFOL; N/A     Comment:  Procedure: ESOPHAGOGASTRODUODENOSCOPY (EGD) WITH   PROPOFOL;  Surgeon: Virgel Manifold, MD;  Location:               [facility] ENDOSCOPY;  Service: Endoscopy;  Laterality: N/A; 06/25/2019: ESOPHAGOGASTRODUODENOSCOPY (EGD) WITH PROPOFOL; N/A     Comment:  Procedure: ESOPHAGOGASTRODUODENOSCOPY (EGD) WITH               PROPOFOL;  Surgeon: Jonathon Bellows, MD;  Location: [facility]               ENDOSCOPY;  Service: Gastroenterology;  Laterality: N/A; 09/12/2019: ESOPHAGOGASTRODUODENOSCOPY (EGD) WITH PROPOFOL; N/A     Comment:  Procedure: ESOPHAGOGASTRODUODENOSCOPY (EGD) WITH               PROPOFOL;  Surgeon: Lucilla Lame, MD;  Location: [facility]               ENDOSCOPY;  Service: Endoscopy;  Laterality: N/A; No date: TUBAL LIGATION  BMI    Body Mass Index: 40.68 kg/m      Reproductive/Obstetrics negative OB ROS                            Anesthesia Physical Anesthesia Plan  ASA: III  Anesthesia Plan: General   Post-op Pain Management:    Induction:   PONV Risk Score and Plan: Propofol infusion and TIVA  Airway Management Planned: Natural Airway and Nasal Cannula  Additional Equipment:  Intra-op Plan:   Post-operative Plan:   Informed Consent: I have reviewed the patients History and Physical, chart, labs and discussed the procedure including the risks, benefits and alternatives for the proposed anesthesia with the patient or authorized representative who has indicated his/her understanding and acceptance.     Dental Advisory Given  Plan Discussed with: Anesthesiologist  Anesthesia Plan Comments:         Anesthesia Quick Evaluation

## 2019-11-20 NOTE — Telephone Encounter (Signed)
Left message on voice mail for  Dr Jairo Ben nurse

## 2019-11-20 NOTE — Anesthesia Procedure Notes (Signed)
Date/Time: 11/20/2019 8:58 AM Performed by: Johnna Acosta, CRNA Pre-anesthesia Checklist: Patient identified, Suction available, Emergency Drugs available, Patient being monitored and Timeout performed Patient Re-evaluated:Patient Re-evaluated prior to induction Preoxygenation: Pre-oxygenation with 100% oxygen Induction Type: IV induction

## 2019-11-21 ENCOUNTER — Other Ambulatory Visit: Payer: Self-pay

## 2019-11-21 ENCOUNTER — Inpatient Hospital Stay: Payer: Medicaid Other | Attending: Oncology

## 2019-11-21 ENCOUNTER — Encounter: Payer: Self-pay | Admitting: Gastroenterology

## 2019-11-21 DIAGNOSIS — D696 Thrombocytopenia, unspecified: Secondary | ICD-10-CM | POA: Insufficient documentation

## 2019-11-21 DIAGNOSIS — D72819 Decreased white blood cell count, unspecified: Secondary | ICD-10-CM

## 2019-11-21 DIAGNOSIS — R161 Splenomegaly, not elsewhere classified: Secondary | ICD-10-CM

## 2019-11-21 DIAGNOSIS — D509 Iron deficiency anemia, unspecified: Secondary | ICD-10-CM

## 2019-11-21 DIAGNOSIS — R790 Abnormal level of blood mineral: Secondary | ICD-10-CM

## 2019-11-21 LAB — COMPREHENSIVE METABOLIC PANEL
ALT: 22 U/L (ref 0–44)
AST: 40 U/L (ref 15–41)
Albumin: 3.3 g/dL — ABNORMAL LOW (ref 3.5–5.0)
Alkaline Phosphatase: 91 U/L (ref 38–126)
Anion gap: 7 (ref 5–15)
BUN: 12 mg/dL (ref 6–20)
CO2: 23 mmol/L (ref 22–32)
Calcium: 8.6 mg/dL — ABNORMAL LOW (ref 8.9–10.3)
Chloride: 109 mmol/L (ref 98–111)
Creatinine, Ser: 0.48 mg/dL (ref 0.44–1.00)
GFR calc Af Amer: >60 mL/min (ref >60)
GFR calc non Af Amer: >60 mL/min (ref >60)
Glucose, Bld: 99 mg/dL (ref 70–99)
Potassium: 3.5 mmol/L (ref 3.5–5.1)
Sodium: 139 mmol/L (ref 135–145)
Total Bilirubin: 1.5 mg/dL — ABNORMAL HIGH (ref 0.3–1.2)
Total Protein: 6.9 g/dL (ref 6.5–8.1)

## 2019-11-21 LAB — CBC WITH DIFFERENTIAL/PLATELET
Abs Immature Granulocytes: 0 10*3/uL (ref 0.00–0.07)
Basophils Absolute: 0 10*3/uL (ref 0.0–0.1)
Basophils Relative: 1 %
Eosinophils Absolute: 0 10*3/uL (ref 0.0–0.5)
Eosinophils Relative: 2 %
HCT: 35.4 % — ABNORMAL LOW (ref 36.0–46.0)
Hemoglobin: 11.2 g/dL — ABNORMAL LOW (ref 12.0–15.0)
Immature Granulocytes: 0 %
Lymphocytes Relative: 33 %
Lymphs Abs: 0.7 10*3/uL (ref 0.7–4.0)
MCH: 29.3 pg (ref 26.0–34.0)
MCHC: 31.6 g/dL (ref 30.0–36.0)
MCV: 92.7 fL (ref 80.0–100.0)
Monocytes Absolute: 0.2 10*3/uL (ref 0.1–1.0)
Monocytes Relative: 9 %
Neutro Abs: 1.1 10*3/uL — ABNORMAL LOW (ref 1.7–7.7)
Neutrophils Relative %: 55 %
Platelets: 41 10*3/uL — ABNORMAL LOW (ref 150–400)
RBC: 3.82 MIL/uL — ABNORMAL LOW (ref 3.87–5.11)
RDW: 15.3 % (ref 11.5–15.5)
WBC: 2 10*3/uL — ABNORMAL LOW (ref 4.0–10.5)
nRBC: 0 % (ref 0.0–0.2)

## 2019-11-21 LAB — IRON AND TIBC
Iron: 66 ug/dL (ref 28–170)
Saturation Ratios: 16 % (ref 10.4–31.8)
TIBC: 425 ug/dL (ref 250–450)
UIBC: 360 ug/dL

## 2019-11-21 LAB — FERRITIN: Ferritin: 9 ng/mL — ABNORMAL LOW (ref 11–307)

## 2019-11-21 LAB — TYPE AND SCREEN
ABO/RH(D): O NEG
Antibody Screen: NEGATIVE

## 2019-11-21 LAB — VITAMIN B12: Vitamin B-12: 243 pg/mL (ref 180–914)

## 2019-11-22 ENCOUNTER — Other Ambulatory Visit: Payer: Medicaid Other

## 2019-11-22 ENCOUNTER — Other Ambulatory Visit: Payer: Self-pay | Admitting: Oncology

## 2019-11-22 ENCOUNTER — Inpatient Hospital Stay (HOSPITAL_BASED_OUTPATIENT_CLINIC_OR_DEPARTMENT_OTHER): Payer: Medicaid Other | Admitting: Oncology

## 2019-11-22 ENCOUNTER — Encounter: Payer: Self-pay | Admitting: Oncology

## 2019-11-22 ENCOUNTER — Other Ambulatory Visit: Payer: Self-pay

## 2019-11-22 ENCOUNTER — Inpatient Hospital Stay: Payer: Medicaid Other

## 2019-11-22 VITALS — BP 124/84 | HR 67 | Temp 98.3°F | Resp 18 | Wt 206.0 lb

## 2019-11-22 DIAGNOSIS — D72819 Decreased white blood cell count, unspecified: Secondary | ICD-10-CM | POA: Diagnosis not present

## 2019-11-22 DIAGNOSIS — R161 Splenomegaly, not elsewhere classified: Secondary | ICD-10-CM

## 2019-11-22 DIAGNOSIS — D696 Thrombocytopenia, unspecified: Secondary | ICD-10-CM

## 2019-11-22 DIAGNOSIS — E538 Deficiency of other specified B group vitamins: Secondary | ICD-10-CM

## 2019-11-22 DIAGNOSIS — D649 Anemia, unspecified: Secondary | ICD-10-CM | POA: Diagnosis not present

## 2019-11-22 DIAGNOSIS — R790 Abnormal level of blood mineral: Secondary | ICD-10-CM

## 2019-11-22 DIAGNOSIS — D509 Iron deficiency anemia, unspecified: Secondary | ICD-10-CM | POA: Diagnosis not present

## 2019-11-22 MED ORDER — ACETAMINOPHEN 325 MG PO TABS
650.0000 mg | ORAL_TABLET | Freq: Once | ORAL | Status: AC
  Administered 2019-11-22: 650 mg via ORAL
  Filled 2019-11-22: qty 2

## 2019-11-22 MED ORDER — SODIUM CHLORIDE 0.9% IV SOLUTION
250.0000 mL | Freq: Once | INTRAVENOUS | Status: AC
  Administered 2019-11-22: 250 mL via INTRAVENOUS
  Filled 2019-11-22: qty 250

## 2019-11-22 MED ORDER — DIPHENHYDRAMINE HCL 25 MG PO CAPS
25.0000 mg | ORAL_CAPSULE | Freq: Once | ORAL | Status: AC
  Administered 2019-11-22: 25 mg via ORAL
  Filled 2019-11-22: qty 1

## 2019-11-22 NOTE — Progress Notes (Signed)
Patient had endoscopy Monday and scheduled for another one tomorrow.  The GI MD recommend she be evaluated for platelet transfusion.

## 2019-11-22 NOTE — Progress Notes (Signed)
[facility] day:  11/22/2019  Chief Complaint: Brandi Bright is a 48 y.o. female follows up for management of iron deficiency anemia, thrombocytopenia, and leukopenia   PERTINENT HEMATOLOGY HISTORY Patient follows up with Dr. Mike Gip previously.  Establish care with me on 02/09/2019. Reviewed patient's previous medical records, labs, imaging results. # History of cirrhosis and hepatitis C.  She has a history of chronic anemia, thrombocytopenia and leukopenia dating back to 03/2012.  Platelet count has fluctuated between 54,000 - 56,000 since 12/28/2017 (previously 73,000 - 134,000).  Ferritin was 14 on 08/31/2018.    Work-up on 09/23/2018 revealed a hematocrit of 31.0, hemoglobin 10.1, MCV 87.6, platelets 46,000, WBC 1900 with an ANC of 1100.  Ferritin was 10 (low) with iron saturation 16% with a TIBC of 401.  Retic was 1.2%.  B12 was 315.  Normal studies included: folate, SPEP, and free light chain ratio.  Copper was 69 (72-166).  ANA was + with double stranded DNA antibody 13 (0-9).  TSH was 0.036 (0.35-4.5) with a free T4 1.05 (0.82-1.77).  Peripheral smear revealed variant lymphocytes.  Ferritin has been followed:  14 on 08/31/2018 and 10 on 09/23/2018.  Abdomen and pelvic CT on 07/14/2018 revealed cirrhosis with portal hypertension noted by prominent splenomegaly (18.5 x 13.8 x 8.1 cm; volume 1100 cm3), enlarged portal veins with recannulated umbilical vein, paraesophageal and perigastric varices.  There was mild thickening of the cecum and ascending colon.  EGD on 10/27/2018 revealed grade II esophageal varices.  There was erythematous mucosa in the antrum.  There was congested, erythematous, friable (with contact bleeding), granular and nodular mucosa in the gastric fundus and astric body.  There was a single gastric polyp (polypoid ulcerated antral type mucos with chronic active mucosal inflammation).  There was a normal duodenal bulb, second  portion of the duodenum and examined duodenum.  The patient's iron deficiency could be explained by her friable gastric mucosa (likely from portal hypertension).  There was no history of variceal bleeding and thus this is not a cause of her iron deficiency.  INTERVAL HISTORY MILLIANA Bright is a 48 y.o. female who has above history reviewed by me today presents for follow up visit for management of thrombocytopenia, splenomegaly, leukopenia and anemia. Problems and complaints are listed below: Patient was admitted from 10/11/2019 to 10/12/2019 due to rectal bleeding EGD 10/12/2019 showed portal hypertensive gastropathy, treated with APC.  Nonbleeding large esophageal varices incompletely evaluated.  Banded. Patient follows with gastroenterology and had EGD on 11/20/2019. Banding was not done due to large amount of food in the stomach. There was plan for EGD on 11/23/2019.  Her gastroenterologist Dr. Bonna Gains contacted me to see if patient can get platelet transfusion to improve her platelet counts for banding on 11/23/2019. Patient reports feeling well today.  Chronic fatigue unchanged.  Denies seeing any black tarry stool or blood in the stool. She takes oral iron supplementation with vitamin C Review of Systems  Constitutional: Negative for appetite change, chills, fatigue and fever.  HENT:   Negative for hearing loss and voice change.   Eyes: Negative for eye problems.  Respiratory: Negative for chest tightness and cough.   Cardiovascular: Negative for chest pain.  Gastrointestinal: Negative for abdominal distention, abdominal pain and blood in stool.  Endocrine: Negative for hot flashes.  Genitourinary: Negative for difficulty urinating and frequency.   Musculoskeletal: Negative for arthralgias.  Skin: Negative for itching and rash.  Neurological: Negative for extremity weakness.  Hematological: Negative for adenopathy. Bruises/bleeds easily.  Psychiatric/Behavioral: Negative for confusion.    Past Medical History:  Diagnosis Date  . Arthritis   . Back pain   . Gout   . Hypothyroidism   . MI, old   . Thyroid disease     Past Surgical History:  Procedure Laterality Date  . CARDIAC CATHETERIZATION    . CESAREAN SECTION    . COLONOSCOPY WITH PROPOFOL N/A 03/16/2018   Procedure: COLONOSCOPY WITH PROPOFOL;  Surgeon: Virgel Manifold, MD;  Location: [facility] ENDOSCOPY;  Service: Endoscopy;  Laterality: N/A;  . ESOPHAGOGASTRODUODENOSCOPY N/A 10/11/2019   Procedure: ESOPHAGOGASTRODUODENOSCOPY (EGD);  Surgeon: Lin Landsman, MD;  Location: [facility] ENDOSCOPY;  Service: Gastroenterology;  Laterality: N/A;  . ESOPHAGOGASTRODUODENOSCOPY (EGD) WITH PROPOFOL N/A 10/27/2018   Procedure: ESOPHAGOGASTRODUODENOSCOPY (EGD) WITH PROPOFOL;  Surgeon: Virgel Manifold, MD;  Location: [facility] ENDOSCOPY;  Service: Endoscopy;  Laterality: N/A;  . ESOPHAGOGASTRODUODENOSCOPY (EGD) WITH PROPOFOL N/A 06/25/2019   Procedure: ESOPHAGOGASTRODUODENOSCOPY (EGD) WITH PROPOFOL;  Surgeon: Jonathon Bellows, MD;  Location: [facility] ENDOSCOPY;  Service: Gastroenterology;  Laterality: N/A;  . ESOPHAGOGASTRODUODENOSCOPY (EGD) WITH PROPOFOL N/A 09/12/2019   Procedure: ESOPHAGOGASTRODUODENOSCOPY (EGD) WITH PROPOFOL;  Surgeon: Lucilla Lame, MD;  Location: [facility] ENDOSCOPY;  Service: Endoscopy;  Laterality: N/A;  . ESOPHAGOGASTRODUODENOSCOPY (EGD) WITH PROPOFOL N/A 11/20/2019   Procedure: ESOPHAGOGASTRODUODENOSCOPY (EGD) WITH PROPOFOL;  Surgeon: Virgel Manifold, MD;  Location: [facility] ENDOSCOPY;  Service: Endoscopy;  Laterality: N/A;  . TUBAL LIGATION      Family History  Problem Relation Age of Onset  . Hypertension Other   . CAD Father   . Colon cancer Father   . Lung cancer Father   . Pancreatic cancer Brother    Social History   Socioeconomic History  . Marital status: Single    Spouse name: Not on file  . Number of children: Not on file  . Years of education: Not on file  . Highest education level: Not on file   Occupational History  . Not on file  Social Needs  . Financial resource strain: Somewhat hard  . Food insecurity    Worry: Never true    Inability: Never true  . Transportation needs    Medical: No    Non-medical: No  Tobacco Use  . Smoking status: Never Smoker  . Smokeless tobacco: Never Used  Substance and Sexual Activity  . Alcohol use: Not Currently    Comment: has not had a drink in about 5 months  . Drug use: Never  . Sexual activity: Not Currently  Lifestyle  . Physical activity    Days per week: 0 days    Minutes per session: 0 min  . Stress: Rather much  Relationships  . Social connections    Talks on phone: More than three times a week    Gets together: More than three times a week    Attends religious service: Never    Active member of club or organization: No    Attends meetings of clubs or organizations: Not on file    Relationship status: Not on file  . Intimate partner violence    Fear of current or ex partner: No    Emotionally abused: No    Physically abused: No    Forced sexual activity: No  Other Topics Concern  . Not on file  Social History Narrative  . Not on file    Allergies:  Allergies  Allergen Reactions  . Vicodin [Hydrocodone-Acetaminophen] Rash  . Amoxicillin Rash and Other (  See Comments)    Has patient had a PCN reaction causing immediate rash, facial/tongue/throat swelling, SOB or lightheadedness with hypotension: Yes Has patient had a PCN reaction causing severe rash involving mucus membranes or skin necrosis: No Has patient had a PCN reaction that required hospitalization: No Has patient had a PCN reaction occurring within the last 10 years: No If all of the above answers are "NO", then may proceed with Cephalosporin use.   . Gabapentin Rash    Current Medications: Current Outpatient Medications  Medication Sig Dispense Refill  . furosemide (LASIX) 20 MG tablet Take 1 tablet (20 mg total) by mouth daily. 30 tablet 11  .  lactulose (CHRONULAC) 10 GM/15ML solution TAKE 15 MLS (10 G TOTAL) BY MOUTH DAILY FOR 30 DAYS. 473 mL 3  . levothyroxine (SYNTHROID, LEVOTHROID) 200 MCG tablet Take 200 mcg by mouth daily.  8  . nitroGLYCERIN (NITROLINGUAL) 0.4 MG/SPRAY spray Place 1 spray under the tongue every 5 (five) minutes x 3 doses as needed for chest pain.    . Sofosbuvir-Velpatasvir (EPCLUSA) 400-100 MG TABS Take 1 tablet by mouth daily. 28 tablet 2  . venlafaxine XR (EFFEXOR-XR) 75 MG 24 hr capsule Take 75 mg by mouth daily.   4  . [employer] VITAMIN B12 1000 MCG tablet TAKE 1 TABLET BY MOUTH EVERY DAY (Patient not taking: Reported on 11/22/2019) 90 tablet 1  . Iron-Vitamin C (VITRON-C) 65-125 MG TABS Take 1 tablet by mouth daily. (Patient not taking: Reported on 11/22/2019) 30 tablet 3  . pantoprazole (PROTONIX) 40 MG tablet Take 1 tablet (40 mg total) by mouth 2 (two) times daily. (Patient not taking: Reported on 11/22/2019) 60 tablet 0  . spironolactone (ALDACTONE) 50 MG tablet Take 50 mg by mouth daily.  5   No current facility-administered medications for this visit.      Physical Exam: Blood pressure 124/84, pulse 67, temperature 98.3 F (36.8 C), resp. rate 18, weight 206 lb (93.4 kg). Physical Exam  Constitutional: She is oriented to person, place, and time. No distress.  HENT:  Head: Normocephalic and atraumatic.  Nose: Nose normal.  Mouth/Throat: Oropharynx is clear and moist. No oropharyngeal exudate.  Chronic right frontal mass.  Eyes: Pupils are equal, round, and reactive to light. EOM are normal. No scleral icterus.  Neck: Normal range of motion. Neck supple.  Cardiovascular: Normal rate and regular rhythm.  No murmur heard. Pulmonary/Chest: Effort normal and breath sounds normal. No respiratory distress. She has no rales. She exhibits no tenderness.  Abdominal: Soft. Bowel sounds are normal. She exhibits no distension. There is no abdominal tenderness.  Splenomegaly  Musculoskeletal: Normal range of  motion.        General: No edema.  Neurological: She is alert and oriented to person, place, and time. No cranial nerve deficit. She exhibits normal muscle tone. Coordination normal.  Skin: Skin is warm and dry. She is not diaphoretic. No erythema.  Psychiatric: Affect normal.    Office Visit on 11/22/2019  Component Date Value Ref Range Status  . Unit Number 11/22/2019 [id number]   Final  . Blood Component Type 11/22/2019 PLTP LI1 PAS   Final  . Unit division 11/22/2019 00   Final  . Status of Unit 11/22/2019 ISSUED   Final  . Transfusion Status 11/22/2019    Final                   Value:OK TO TRANSFUSE Performed at [facility], [place], [place]  [place] / TIME 11/22/2019 [id number]   Final  . Blood Product Unit Number 11/22/2019 [id number]   Final  . PRODUCT CODE 11/22/2019 [id number]   Final  . Unit Type and Rh 11/22/2019 9500   Final  . Blood Product Expiration Date 11/22/2019 [id number]   Final  Appointment on 11/21/2019  Component Date Value Ref Range Status  . ABO/RH(D) 11/21/2019 O NEG   Final  . Antibody Screen 11/21/2019 NEG   Final  . Sample Expiration 11/21/2019    Final                   Value:11/24/2019,2359 Performed at [facility], [address], [place], [place] [address]   . Vitamin B-12 11/21/2019 243  180 - 914 pg/mL Final   Comment: (NOTE) This assay is not validated for testing neonatal or myeloproliferative syndrome specimens for Vitamin B12 levels. Performed at [facility], [place] [address] [place], [place] [address]   . Iron 11/21/2019 66  28 - 170 ug/dL Final  . TIBC 11/21/2019 425  250 - 450 ug/dL Final  . Saturation Ratios 11/21/2019 16  10.4 - 31.8 % Final  . UIBC 11/21/2019 360  ug/dL Final   Performed at [facility], [address], [place], [place] [address]  . Ferritin 11/21/2019 9* 11 - 307 ng/mL Final   Performed at [facility], [place] [place], [place] [address]  . Sodium 11/21/2019 139  135 - 145 mmol/L Final  . Potassium 11/21/2019 3.5  3.5 - 5.1 mmol/L Final  . Chloride 11/21/2019 109  98 - 111 mmol/L Final  . CO2 11/21/2019 23  22 - 32 mmol/L Final  . Glucose, Bld 11/21/2019 99  70 - 99 mg/dL Final  . BUN 11/21/2019 12  6 - 20 mg/dL Final  . Creatinine, Ser 11/21/2019 0.48  0.44 - 1.00 mg/dL Final  . Calcium 11/21/2019 8.6* 8.9 - 10.3 mg/dL Final  . Total Protein 11/21/2019 6.9  6.5 - 8.1 g/dL Final  . Albumin 11/21/2019 3.3* 3.5 - 5.0 g/dL Final  . AST 11/21/2019 40  15 - 41 U/L Final  . ALT 11/21/2019 22  0 - 44 U/L Final  . Alkaline Phosphatase 11/21/2019 91  38 - 126 U/L Final  . Total Bilirubin 11/21/2019 1.5* 0.3 - 1.2 mg/dL Final  . GFR calc non Af Amer 11/21/2019 >60  >60 mL/min Final  . GFR calc Af Amer 11/21/2019 >60  >60 mL/min Final  . Anion gap 11/21/2019 7  5 - 15 Final   Performed at [facility], [address] [place], [place] [address]  . WBC 11/21/2019 2.0* 4.0 - 10.5 K/uL Final  . RBC 11/21/2019 3.82* 3.87 - 5.11 MIL/uL Final  . Hemoglobin 11/21/2019 11.2* 12.0 - 15.0 g/dL Final  . HCT 11/21/2019 35.4* 36.0 - 46.0 % Final  . MCV 11/21/2019 92.7  80.0 - 100.0 fL Final  . MCH 11/21/2019 29.3  26.0 - 34.0 pg Final  . MCHC 11/21/2019 31.6  30.0 - 36.0 g/dL Final  . RDW 11/21/2019 15.3  11.5 - 15.5 % Final  . Platelets 11/21/2019 41* 150 - 400 K/uL Final   Comment: Immature Platelet Fraction may be clinically indicated, consider ordering this additional test [id number]   . nRBC 11/21/2019 0.0  0.0 - 0.2 % Final  . Neutrophils Relative % 11/21/2019 55  % Final  . Neutro Abs 11/21/2019 1.1* 1.7 - 7.7 K/uL  Final  . Lymphocytes Relative 11/21/2019 33  % Final  . Lymphs Abs 11/21/2019 0.7  0.7 - 4.0 K/uL Final  . Monocytes Relative 11/21/2019 9  % Final  . Monocytes Absolute 11/21/2019 0.2  0.1 - 1.0 K/uL Final  . Eosinophils Relative 11/21/2019 2  % Final  . Eosinophils Absolute  11/21/2019 0.0  0.0 - 0.5 K/uL Final  . Basophils Relative 11/21/2019 1  % Final  . Basophils Absolute 11/21/2019 0.0  0.0 - 0.1 K/uL Final  . Immature Granulocytes 11/21/2019 0  % Final  . Abs Immature Granulocytes 11/21/2019 0.00  0.00 - 0.07 K/uL Final   Performed at [facility], [address] [place], [place] [address]    RADIOGRAPHIC STUDIES: I have personally reviewed the radiological images as listed and agreed with the findings in the report. Dg Esophagus W Double Cm (hd)  Result Date: 09/04/2019 CLINICAL DATA:  Dysphagia.  Stomach pain.  Cirrhosis.  Varices. EXAM: ESOPHOGRAM / BARIUM SWALLOW / BARIUM TABLET STUDY TECHNIQUE: Combined double contrast and single contrast examination performed using effervescent crystals, thick barium liquid, and thin barium liquid. The patient was observed with fluoroscopy swallowing a 13 mm barium sulphate tablet. FLUOROSCOPY TIME:  Fluoroscopy Time:  1 minutes 18 seconds Radiation Exposure Index (if provided by the fluoroscopic device): 37.9 mGy COMPARISON:  CT 06/24/2019. FINDINGS: Cervical esophagus is normal. Serpiginous contour is noted over the distal esophagus most likely secondary to patient's known varices. Mild focal narrowing noted of the distal esophagus at the gastroesophageal junction. This could be benign or malignant. This did not result in obstruction of passage of a standardized barium tablet. Peristalsis normal. No reflux demonstrated. Irregular contour noted of the stomach, possibly related to the patient's varices. Gastritis and or gastric ulcers cannot be excluded. IMPRESSION: 1. Serpiginous contour is noted over the distal esophagus most likely secondary to patient's known varices. 2. Mild focal narrowing of the distal esophagus at the gastroesophageal junction. This could be benign or malignant. This did not result in obstruction of passage of a standardized barium tablet. 3. Irregular contour the stomach, possibly related to the  patient's varices. Gastritis and or gastric ulcers cannot be excluded. Electronically Signed   By: Marcello Moores  Register   On: 09/04/2019 10:50    Assessment:  REIKA PROSE is a 48 y.o. female with history of liver cirrhosis, hepatitis C, splenomegaly, portal hypertension, chronic pancytopenia present for follow-up.  1. Iron deficiency anemia, unspecified iron deficiency anemia type   2. Thrombocytopenia (HCC)   3. Leukopenia, unspecified type   4. Splenomegaly   5. Normocytic anemia   6. Low vitamin B12 level    #Thrombocytopenia, secondary to splenomegaly/chronic liver disease. Proceed with 1 unit of platelet transfusion today to increase platelet counts for tomorrow's EGD. For future elective procedures, can consider TPO for a week followed by elective procedures.  Discussed with patient.  #Leukopenia and also leukocytopenia secondary to chronic liver disease, cirrhosis, portal hypertension, splenomegaly. Slightly worsened.  Continue to monitor.  #Iron deficiency anemia, Patient has previously received IV iron treatments.   Reviewed labs and discussed with her.  Hemoglobin slightly decreased to 11.2.  Iron panel is consistent with iron deficiency. Discussed about the rationale of proceeding with IV iron treatments.  Patient prefers to take oral iron supplementation with vitamin C if no improvement she will do IV iron infusion.  #Copper deficiency, advised patient to continue take copper supplementation.  Copper levels are pending. #Low normal vitamin B12 level, recommend patient  to continue oral vitamin B12 supplementation. # Liver cirrhosis secondary to chronic hepatitis C, esophageal varices, splenomegaly EGD tomorrow  Follow up in 3 months for repeat iron TIBC, ferritin, cbc and MD assessment  We spent sufficient time to discuss many aspect of care, questions were answered to patient's satisfaction.   Earlie Server, MD, PhD Hematology Oncology [facility] Pager- [phone] 11/22/2019

## 2019-11-23 ENCOUNTER — Telehealth: Payer: Self-pay

## 2019-11-23 ENCOUNTER — Ambulatory Visit: Payer: Medicaid Other | Admitting: Certified Registered Nurse Anesthetist

## 2019-11-23 ENCOUNTER — Encounter
Admission: RE | Disposition: A | Discharge status: Home / Self Care | Payer: Self-pay | Source: Home / Self Care | Attending: Gastroenterology

## 2019-11-23 ENCOUNTER — Ambulatory Visit
Admission: RE | Admit: 2019-11-23 | Discharge: 2019-11-23 | Disposition: A | Discharge status: Home / Self Care | Payer: Medicaid Other | Attending: Gastroenterology | Admitting: Gastroenterology

## 2019-11-23 ENCOUNTER — Encounter: Payer: Self-pay | Admitting: Gastroenterology

## 2019-11-23 ENCOUNTER — Other Ambulatory Visit: Payer: Self-pay

## 2019-11-23 DIAGNOSIS — Z6841 Body Mass Index (BMI) 40.0 and over, adult: Secondary | ICD-10-CM | POA: Insufficient documentation

## 2019-11-23 DIAGNOSIS — F419 Anxiety disorder, unspecified: Secondary | ICD-10-CM | POA: Insufficient documentation

## 2019-11-23 DIAGNOSIS — K766 Portal hypertension: Secondary | ICD-10-CM | POA: Diagnosis not present

## 2019-11-23 DIAGNOSIS — E785 Hyperlipidemia, unspecified: Secondary | ICD-10-CM | POA: Diagnosis not present

## 2019-11-23 DIAGNOSIS — I8500 Esophageal varices without bleeding: Secondary | ICD-10-CM

## 2019-11-23 DIAGNOSIS — Z79899 Other long term (current) drug therapy: Secondary | ICD-10-CM | POA: Insufficient documentation

## 2019-11-23 DIAGNOSIS — I252 Old myocardial infarction: Secondary | ICD-10-CM | POA: Insufficient documentation

## 2019-11-23 DIAGNOSIS — F329 Major depressive disorder, single episode, unspecified: Secondary | ICD-10-CM | POA: Diagnosis not present

## 2019-11-23 DIAGNOSIS — I8501 Esophageal varices with bleeding: Secondary | ICD-10-CM | POA: Diagnosis not present

## 2019-11-23 DIAGNOSIS — E039 Hypothyroidism, unspecified: Secondary | ICD-10-CM | POA: Insufficient documentation

## 2019-11-23 DIAGNOSIS — I10 Essential (primary) hypertension: Secondary | ICD-10-CM | POA: Insufficient documentation

## 2019-11-23 DIAGNOSIS — K3189 Other diseases of stomach and duodenum: Secondary | ICD-10-CM

## 2019-11-23 DIAGNOSIS — Z801 Family history of malignant neoplasm of trachea, bronchus and lung: Secondary | ICD-10-CM | POA: Insufficient documentation

## 2019-11-23 DIAGNOSIS — Z8249 Family history of ischemic heart disease and other diseases of the circulatory system: Secondary | ICD-10-CM | POA: Diagnosis not present

## 2019-11-23 DIAGNOSIS — M1990 Unspecified osteoarthritis, unspecified site: Secondary | ICD-10-CM | POA: Insufficient documentation

## 2019-11-23 DIAGNOSIS — Z79890 Hormone replacement therapy (postmenopausal): Secondary | ICD-10-CM | POA: Insufficient documentation

## 2019-11-23 HISTORY — PX: ESOPHAGOGASTRODUODENOSCOPY (EGD) WITH PROPOFOL: SHX5813

## 2019-11-23 LAB — BPAM PLATELET PHERESIS
Blood Product Expiration Date: 202012102359
ISSUE DATE / TIME: 202012091412
Unit Type and Rh: 9500

## 2019-11-23 LAB — PREPARE PLATELET PHERESIS: Unit division: 0

## 2019-11-23 SURGERY — ESOPHAGOGASTRODUODENOSCOPY (EGD) WITH PROPOFOL
Anesthesia: General

## 2019-11-23 MED ORDER — ONDANSETRON HCL 4 MG/2ML IJ SOLN
INTRAMUSCULAR | Status: DC | PRN
  Administered 2019-11-23: 4 mg via INTRAVENOUS

## 2019-11-23 MED ORDER — MIDAZOLAM HCL 2 MG/2ML IJ SOLN
INTRAMUSCULAR | Status: DC | PRN
  Administered 2019-11-23: 2 mg via INTRAVENOUS

## 2019-11-23 MED ORDER — PROPOFOL 10 MG/ML IV BOLUS
INTRAVENOUS | Status: DC | PRN
  Administered 2019-11-23 (×3): 20 mg via INTRAVENOUS
  Administered 2019-11-23: 80 mg via INTRAVENOUS

## 2019-11-23 MED ORDER — SODIUM CHLORIDE 0.9 % IV SOLN
INTRAVENOUS | Status: DC
  Administered 2019-11-23: 1000 mL via INTRAVENOUS

## 2019-11-23 MED ORDER — LIDOCAINE HCL (CARDIAC) PF 100 MG/5ML IV SOSY
PREFILLED_SYRINGE | INTRAVENOUS | Status: DC | PRN
  Administered 2019-11-23: 50 mg via INTRAVENOUS

## 2019-11-23 MED ORDER — PROPOFOL 500 MG/50ML IV EMUL
INTRAVENOUS | Status: AC
  Filled 2019-11-23: qty 50

## 2019-11-23 MED ORDER — MIDAZOLAM HCL 2 MG/2ML IJ SOLN
INTRAMUSCULAR | Status: AC
  Filled 2019-11-23: qty 2

## 2019-11-23 MED ORDER — PROPOFOL 500 MG/50ML IV EMUL
INTRAVENOUS | Status: DC | PRN
  Administered 2019-11-23: 135 ug/kg/min via INTRAVENOUS

## 2019-11-23 NOTE — Anesthesia Preprocedure Evaluation (Signed)
Anesthesia Evaluation  Patient identified by MRN, date of birth, ID band Patient awake    Reviewed: Allergy & Precautions, H&P , NPO status , Patient's Chart, lab work & pertinent test results, reviewed documented beta blocker date and time   Airway Mallampati: II   Neck ROM: full    Dental  (+) Poor Dentition   Pulmonary neg pulmonary ROS,    Pulmonary exam normal        Cardiovascular Exercise Tolerance: Poor hypertension, On Medications + CAD and + Past MI  Normal cardiovascular exam Rhythm:regular Rate:Normal     Neuro/Psych PSYCHIATRIC DISORDERS Anxiety Depression negative neurological ROS     GI/Hepatic negative GI ROS, (+) Cirrhosis   Esophageal Varices    ,   Endo/Other  Hypothyroidism Morbid obesity  Renal/GU negative Renal ROS  negative genitourinary   Musculoskeletal   Abdominal   Peds  Hematology negative hematology ROS (+) Blood dyscrasia, anemia ,   Anesthesia Other Findings Past Medical History: No date: Arthritis No date: Back pain No date: Gout No date: Hypothyroidism No date: MI, old No date: Thyroid disease Past Surgical History: No date: CARDIAC CATHETERIZATION No date: CESAREAN SECTION 03/16/2018: COLONOSCOPY WITH PROPOFOL; N/A     Comment:  Procedure: COLONOSCOPY WITH PROPOFOL;  Surgeon:               Virgel Manifold, MD;  Location: [facility] ENDOSCOPY;                Service: Endoscopy;  Laterality: N/A; 10/11/2019: ESOPHAGOGASTRODUODENOSCOPY; N/A     Comment:  Procedure: ESOPHAGOGASTRODUODENOSCOPY (EGD);  Surgeon:               Lin Landsman, MD;  Location: [facility] ENDOSCOPY;                Service: Gastroenterology;  Laterality: N/A; 10/27/2018: ESOPHAGOGASTRODUODENOSCOPY (EGD) WITH PROPOFOL; N/A     Comment:  Procedure: ESOPHAGOGASTRODUODENOSCOPY (EGD) WITH               PROPOFOL;  Surgeon: Virgel Manifold, MD;  Location:               [facility] ENDOSCOPY;  Service:  Endoscopy;  Laterality: N/A; 06/25/2019: ESOPHAGOGASTRODUODENOSCOPY (EGD) WITH PROPOFOL; N/A     Comment:  Procedure: ESOPHAGOGASTRODUODENOSCOPY (EGD) WITH               PROPOFOL;  Surgeon: Jonathon Bellows, MD;  Location: [facility]               ENDOSCOPY;  Service: Gastroenterology;  Laterality: N/A; 09/12/2019: ESOPHAGOGASTRODUODENOSCOPY (EGD) WITH PROPOFOL; N/A     Comment:  Procedure: ESOPHAGOGASTRODUODENOSCOPY (EGD) WITH               PROPOFOL;  Surgeon: Lucilla Lame, MD;  Location: [facility]               ENDOSCOPY;  Service: Endoscopy;  Laterality: N/A; 11/20/2019: ESOPHAGOGASTRODUODENOSCOPY (EGD) WITH PROPOFOL; N/A     Comment:  Procedure: ESOPHAGOGASTRODUODENOSCOPY (EGD) WITH               PROPOFOL;  Surgeon: Virgel Manifold, MD;  Location:               [facility] ENDOSCOPY;  Service: Endoscopy;  Laterality: N/A; No date: TUBAL LIGATION BMI    Body Mass Index: 43.28 kg/m     Reproductive/Obstetrics negative OB ROS  Anesthesia Physical Anesthesia Plan  ASA: III  Anesthesia Plan: General   Post-op Pain Management:    Induction:   PONV Risk Score and Plan:   Airway Management Planned:   Additional Equipment:   Intra-op Plan:   Post-operative Plan:   Informed Consent: I have reviewed the patients History and Physical, chart, labs and discussed the procedure including the risks, benefits and alternatives for the proposed anesthesia with the patient or authorized representative who has indicated his/her understanding and acceptance.     Dental Advisory Given  Plan Discussed with: CRNA  Anesthesia Plan Comments:         Anesthesia Quick Evaluation

## 2019-11-23 NOTE — Telephone Encounter (Addendum)
Called and left a message for call back to scheduled a appointment for EGD

## 2019-11-23 NOTE — Progress Notes (Signed)
Patient's pain continues to resolve, nausea/vomiting resolved.  Ready to discharge.  Instructions reviewed as well as when to notify MD or return to ED if bleeding/ severe abdominal pain occurs.  Patient to follow clear liquid diet for 24 hours post procedure and then may advance to soft diet as tolerating.  Dr. Bonna Gains has spoken with the patient and agrees with discharge at this time.

## 2019-11-23 NOTE — Telephone Encounter (Signed)
Order labs for 3 weeks from now. Will call patient to scheduled EGD

## 2019-11-23 NOTE — Addendum Note (Signed)
Addended by: Ulyess Blossom L on: 11/23/2019 12:46 PM   Modules accepted: Orders

## 2019-11-23 NOTE — Op Note (Signed)
[facility] Gastroenterology Patient Name: Brandi Bright Procedure Date: 11/23/2019 10:31 AM MRN: [id number] Account #: [id number] Date of Birth: 1971/11/28 Admit Type: Outpatient Age: 48 Room: [facility] ENDO ROOM 3 Gender: Female Note Status: Finalized Procedure:             Upper GI endoscopy Indications:           Follow-up of esophageal varices Providers:             Kateline Kinkade B. Maximino Greenland MD, MD Referring MD:          Corky Downs, MD (Referring MD) Medicines:             Monitored Anesthesia Care Complications:         No immediate complications. Procedure:             Pre-Anesthesia Assessment:                        - The risks and benefits of the procedure and the                         sedation options and risks were discussed with the                         patient. All questions were answered and informed                         consent was obtained.                        - Patient identification and proposed procedure were                         verified prior to the procedure.                        - ASA Grade Assessment: II - A patient with mild                         systemic disease.                        After obtaining informed consent, the endoscope was                         passed under direct vision. Throughout the procedure,                         the patient's blood pressure, pulse, and oxygen                         saturations were monitored continuously. The Endoscope                         was introduced through the mouth, and advanced to the                         second part of duodenum. The upper GI endoscopy was                         accomplished  with ease. The patient tolerated the                         procedure well. Findings:      Grade II varices with no stigmata of recent bleeding were found in the       mid esophagus and in the distal esophagus,. No red wale signs were       present. Scarring from prior treatment  was visible. Four bands were       successfully placed with complete eradication, resulting in deflation of       varices. There was no bleeding during and at the end of the procedure.      Portal hypertensive gastropathy was found in the entire examined stomach.      The examined duodenum was normal. Impression:            - Grade II esophageal varices with no stigmata of                         recent bleeding. Completely eradicated. Banded.                        - Portal hypertensive gastropathy.                        - Normal examined duodenum.                        - No specimens collected. Recommendation:        - Clear liquid diet today, then advance as tolerated                         to soft diet for 3 days.                        - Continue present medications.                        - Return to my office as previously scheduled.                        - Repeat upper endoscopy in 4 weeks for surveillance.                        - The findings and recommendations were discussed with                         the patient. Procedure Code(s):     --- Professional ---                        [phone], Esophagogastroduodenoscopy, flexible,                         transoral; with band ligation of esophageal/gastric                         varices Diagnosis Code(s):     --- Professional ---                        I85.00, Esophageal varices without bleeding  K76.6, Portal hypertension                        K31.89, Other diseases of stomach and duodenum CPT copyright 2019 American Medical Association. All rights reserved. The codes documented in this report are preliminary and upon coder review may  be revised to meet current compliance requirements.  [name], MD [name] MD, MD 11/23/2019 10:59:26 AM This report has been signed electronically. Number of Addenda: 0 Note Initiated On: 11/23/2019 10:31 AM Estimated Blood Loss:  Estimated  blood loss: none.      [facility]

## 2019-11-23 NOTE — Anesthesia Post-op Follow-up Note (Signed)
Anesthesia QCDR form completed.        

## 2019-11-23 NOTE — Transfer of Care (Signed)
Immediate Anesthesia Transfer of Care Note  Patient: Brandi Bright  Procedure(s) Performed: ESOPHAGOGASTRODUODENOSCOPY (EGD) WITH PROPOFOL (N/A )  Patient Location: PACU and Endoscopy Unit  Anesthesia Type:General  Level of Consciousness: awake, alert , oriented and patient cooperative  Airway & Oxygen Therapy: Patient Spontanous Breathing  Post-op Assessment: Report given to RN and Post -op Vital signs reviewed and stable  Post vital signs: Reviewed and stable  Last Vitals:  Vitals Value Taken Time  BP 155/86 11/23/19 1100  Temp    Pulse 77 11/23/19 1100  Resp 17 11/23/19 1100  SpO2 97 11/23/19 1100    Last Pain:  Vitals:   11/23/19 0925  TempSrc: Temporal  PainSc: 0-No pain         Complications: No apparent anesthesia complications

## 2019-11-23 NOTE — Telephone Encounter (Signed)
Called patient and scheduled patient procedure on 12/27/2019. Informed patient about CBC and COVID test. Also informed patient of clear liquids the day before. Sent instructions to [employer] and mailed them to patient

## 2019-11-23 NOTE — Telephone Encounter (Signed)
-----   Message from Virgel Manifold, MD sent at 11/23/2019 11:02 AM EST ----- Lets set her up for a repeat EGD in 4 weeks from today.   1. CBC 1 week before EGD 2. If her platelets are below 50 on that CBC she will need platelet transfusion prior to the procdure 3. Clear liquid diet instructions day before her procedure and NPO past midnight

## 2019-11-23 NOTE — Progress Notes (Signed)
Patient states that nausea has resolved but continues to 'spit up' clear, thin saliva because it 'just won't go down all the way'.  Pain is resolving as well, at a level 3 currently and improves per patient 'as I am able to belch'.  Vital signs remain stable, O2 sat 100% on room air, abdomen is soft, non tender and non-distended.  Currently does not want anything to drink, will wait till she gets home.

## 2019-11-23 NOTE — H&P (Signed)
[name], MD [address], [place], [place], [place], [address] [address], [place], [place], [place], [address] Phone: [phone]  Fax: [phone]  Primary Care Physician:  Cletis Athens, MD   Pre-Procedure History & Physical: HPI:  Brandi Bright is a 48 y.o. female is here for an EGD.   Past Medical History:  Diagnosis Date  . Arthritis   . Back pain   . Gout   . Hypothyroidism   . MI, old   . Thyroid disease     Past Surgical History:  Procedure Laterality Date  . CARDIAC CATHETERIZATION    . CESAREAN SECTION    . COLONOSCOPY WITH PROPOFOL N/A 03/16/2018   Procedure: COLONOSCOPY WITH PROPOFOL;  Surgeon: Virgel Manifold, MD;  Location: [facility] ENDOSCOPY;  Service: Endoscopy;  Laterality: N/A;  . ESOPHAGOGASTRODUODENOSCOPY N/A 10/11/2019   Procedure: ESOPHAGOGASTRODUODENOSCOPY (EGD);  Surgeon: Lin Landsman, MD;  Location: [facility] ENDOSCOPY;  Service: Gastroenterology;  Laterality: N/A;  . ESOPHAGOGASTRODUODENOSCOPY (EGD) WITH PROPOFOL N/A 10/27/2018   Procedure: ESOPHAGOGASTRODUODENOSCOPY (EGD) WITH PROPOFOL;  Surgeon: Virgel Manifold, MD;  Location: [facility] ENDOSCOPY;  Service: Endoscopy;  Laterality: N/A;  . ESOPHAGOGASTRODUODENOSCOPY (EGD) WITH PROPOFOL N/A 06/25/2019   Procedure: ESOPHAGOGASTRODUODENOSCOPY (EGD) WITH PROPOFOL;  Surgeon: Jonathon Bellows, MD;  Location: [facility] ENDOSCOPY;  Service: Gastroenterology;  Laterality: N/A;  . ESOPHAGOGASTRODUODENOSCOPY (EGD) WITH PROPOFOL N/A 09/12/2019   Procedure: ESOPHAGOGASTRODUODENOSCOPY (EGD) WITH PROPOFOL;  Surgeon: Lucilla Lame, MD;  Location: [facility] ENDOSCOPY;  Service: Endoscopy;  Laterality: N/A;  . ESOPHAGOGASTRODUODENOSCOPY (EGD) WITH PROPOFOL N/A 11/20/2019   Procedure: ESOPHAGOGASTRODUODENOSCOPY (EGD) WITH PROPOFOL;  Surgeon: Virgel Manifold, MD;  Location: [facility] ENDOSCOPY;  Service: Endoscopy;  Laterality: N/A;  . TUBAL LIGATION      Prior to Admission medications   Medication Sig Start Date  End Date Taking? Authorizing Provider  [employer] VITAMIN B12 1000 MCG tablet TAKE 1 TABLET BY MOUTH EVERY DAY 08/13/19  Yes Earlie Server, MD  furosemide (LASIX) 20 MG tablet Take 1 tablet (20 mg total) by mouth daily. 10/12/19 10/11/20 Yes Mody, Sital, MD  Iron-Vitamin C (VITRON-C) 65-125 MG TABS Take 1 tablet by mouth daily. 08/15/19  Yes Earlie Server, MD  lactulose (CHRONULAC) 10 GM/15ML solution TAKE 15 MLS (10 G TOTAL) BY MOUTH DAILY FOR 30 DAYS. 08/24/19  Yes [name] B, MD  levothyroxine (SYNTHROID, LEVOTHROID) 200 MCG tablet Take 200 mcg by mouth daily. 11/26/17  Yes [provider]  nitroGLYCERIN (NITROLINGUAL) 0.4 MG/SPRAY spray Place 1 spray under the tongue every 5 (five) minutes x 3 doses as needed for chest pain.   Yes [provider]  pantoprazole (PROTONIX) 40 MG tablet Take 1 tablet (40 mg total) by mouth 2 (two) times daily. 10/17/19  Yes Virgel Manifold, MD  Sofosbuvir-Velpatasvir (EPCLUSA) 400-100 MG TABS Take 1 tablet by mouth daily. 08/30/19  Yes [name] B, MD  spironolactone (ALDACTONE) 50 MG tablet Take 50 mg by mouth daily. 08/21/18  Yes [provider]  venlafaxine XR (EFFEXOR-XR) 75 MG 24 hr capsule Take 75 mg by mouth daily.  08/22/18  Yes [provider]    Allergies as of 11/20/2019 - Review Complete 11/20/2019  Allergen Reaction Noted  . Vicodin [hydrocodone-acetaminophen] Rash 01/11/2016  . Amoxicillin Rash and Other (See Comments) 01/11/2016  . Gabapentin Rash 01/11/2016    Family History  Problem Relation Age of Onset  . Hypertension Other   . CAD Father   . Colon cancer Father   . Lung cancer Father   .  Pancreatic cancer Brother     Social History   Socioeconomic History  . Marital status: Single    Spouse name: Not on file  . Number of children: Not on file  . Years of education: Not on file  . Highest education level: Not on file  Occupational History  . Not on file  Tobacco Use  . Smoking status: Never  Smoker  . Smokeless tobacco: Never Used  Substance and Sexual Activity  . Alcohol use: Not Currently    Comment: has not had a drink in about 5 months  . Drug use: Never  . Sexual activity: Not Currently  Other Topics Concern  . Not on file  Social History Narrative  . Not on file   Social Determinants of Health   Financial Resource Strain: Medium Risk  . Difficulty of Paying Living Expenses: Somewhat hard  Food Insecurity: No Food Insecurity  . Worried About Charity fundraiser in the Last Year: Never true  . Ran Out of Food in the Last Year: Never true  Transportation Needs: No Transportation Needs  . Lack of Transportation (Medical): No  . Lack of Transportation (Non-Medical): No  Physical Activity: Inactive  . Days of Exercise per Week: 0 days  . Minutes of Exercise per Session: 0 min  Stress: Stress Concern Present  . Feeling of Stress : Rather much  Social Connections: Unknown  . Frequency of Communication with Friends and Family: More than three times a week  . Frequency of Social Gatherings with Friends and Family: More than three times a week  . Attends Religious Services: Never  . Active Member of Clubs or Organizations: No  . Attends Archivist Meetings: Not asked  . Marital Status: Not on file  Intimate Partner Violence: Not At Risk  . Fear of Current or Ex-Partner: No  . Emotionally Abused: No  . Physically Abused: No  . Sexually Abused: No    Review of Systems: See HPI, otherwise negative ROS  Physical Exam: BP (!) 119/92   Pulse 73   Temp 97.7 F (36.5 C) (Temporal)   Resp 20   Ht 4\' 9"  (1.448 m)   Wt 90.7 kg   LMP  (LMP Unknown) Comment: last menstrual 9 years ago  SpO2 98%   BMI 43.28 kg/m  General:   Alert,  pleasant and cooperative in NAD Head:  Normocephalic and atraumatic. Neck:  Supple; no masses or thyromegaly. Lungs:  Clear throughout to auscultation, normal respiratory effort.    Heart:  +S1, +S2, Regular rate and rhythm,  No edema. Abdomen:  Soft, nontender and nondistended. Normal bowel sounds, without guarding, and without rebound.   Neurologic:  Alert and  oriented x4;  grossly normal neurologically.  Impression/Plan: [name] is here for an EGD for variceal banding  Risks, benefits, limitations, and alternatives regarding the procedure have been reviewed with the patient.  Questions have been answered.  All parties agreeable.   Virgel Manifold, MD  11/23/2019, 10:27 AM

## 2019-11-24 ENCOUNTER — Encounter: Payer: Self-pay | Admitting: *Deleted

## 2019-11-24 LAB — COPPER, SERUM: Copper: 116 ug/dL (ref 72–166)

## 2019-12-06 NOTE — Anesthesia Postprocedure Evaluation (Signed)
Anesthesia Post Note  Patient: DOMINEQUE BURGI  Procedure(s) Performed: ESOPHAGOGASTRODUODENOSCOPY (EGD) WITH PROPOFOL (N/A )  Patient location during evaluation: PACU Anesthesia Type: General Level of consciousness: awake and alert Pain management: pain level controlled Vital Signs Assessment: post-procedure vital signs reviewed and stable Respiratory status: spontaneous breathing, nonlabored ventilation, respiratory function stable and patient connected to nasal cannula oxygen Cardiovascular status: blood pressure returned to baseline and stable Postop Assessment: no apparent nausea or vomiting Anesthetic complications: no     Last Vitals:  Vitals:   11/23/19 1128 11/23/19 1138  BP: (!) 143/85 (!) 137/98  Pulse: 63 64  Resp: (!) 22 16  Temp:    SpO2: 99% 99%    Last Pain:  Vitals:   11/23/19 1138  TempSrc:   PainSc: 3                  Molli Barrows

## 2019-12-20 DIAGNOSIS — M542 Cervicalgia: Secondary | ICD-10-CM | POA: Diagnosis not present

## 2019-12-20 DIAGNOSIS — M545 Low back pain: Secondary | ICD-10-CM | POA: Diagnosis not present

## 2019-12-20 DIAGNOSIS — G894 Chronic pain syndrome: Secondary | ICD-10-CM | POA: Diagnosis not present

## 2019-12-20 DIAGNOSIS — M25512 Pain in left shoulder: Secondary | ICD-10-CM | POA: Diagnosis not present

## 2019-12-25 ENCOUNTER — Other Ambulatory Visit: Admission: RE | Admit: 2019-12-25 | Payer: Medicaid Other | Source: Ambulatory Visit

## 2019-12-25 ENCOUNTER — Telehealth: Payer: Self-pay

## 2019-12-25 NOTE — Telephone Encounter (Signed)
Patient did not go for COVID test today so we are going to have to rescheduled procedure scheduled on 12/27/2019. Called patient and moved patient to 01/03/2020 with Dr. Marius Ditch

## 2019-12-28 ENCOUNTER — Ambulatory Visit: Payer: Medicaid Other | Admitting: Gastroenterology

## 2020-01-01 ENCOUNTER — Other Ambulatory Visit
Admission: RE | Admit: 2020-01-01 | Discharge: 2020-01-01 | Disposition: A | Discharge status: Home / Self Care | Payer: Medicaid Other | Source: Ambulatory Visit | Attending: Gastroenterology | Admitting: Gastroenterology

## 2020-01-01 DIAGNOSIS — Z01812 Encounter for preprocedural laboratory examination: Secondary | ICD-10-CM | POA: Insufficient documentation

## 2020-01-01 DIAGNOSIS — Z20822 Contact with and (suspected) exposure to covid-19: Secondary | ICD-10-CM | POA: Diagnosis not present

## 2020-01-01 LAB — SARS CORONAVIRUS 2 (TAT 6-24 HRS): SARS Coronavirus 2: NEGATIVE

## 2020-01-02 ENCOUNTER — Encounter: Payer: Self-pay | Admitting: Gastroenterology

## 2020-01-03 ENCOUNTER — Ambulatory Visit: Payer: Medicaid Other | Admitting: Anesthesiology

## 2020-01-03 ENCOUNTER — Telehealth: Payer: Self-pay

## 2020-01-03 ENCOUNTER — Ambulatory Visit
Admission: RE | Admit: 2020-01-03 | Discharge: 2020-01-03 | Disposition: A | Discharge status: Home / Self Care | Payer: Medicaid Other | Attending: Gastroenterology | Admitting: Gastroenterology

## 2020-01-03 ENCOUNTER — Other Ambulatory Visit: Payer: Self-pay

## 2020-01-03 ENCOUNTER — Encounter
Admission: RE | Disposition: A | Discharge status: Home / Self Care | Payer: Self-pay | Source: Home / Self Care | Attending: Gastroenterology

## 2020-01-03 ENCOUNTER — Encounter: Payer: Self-pay | Admitting: Gastroenterology

## 2020-01-03 DIAGNOSIS — K3189 Other diseases of stomach and duodenum: Secondary | ICD-10-CM | POA: Insufficient documentation

## 2020-01-03 DIAGNOSIS — Z888 Allergy status to other drugs, medicaments and biological substances status: Secondary | ICD-10-CM | POA: Diagnosis not present

## 2020-01-03 DIAGNOSIS — Z885 Allergy status to narcotic agent status: Secondary | ICD-10-CM | POA: Diagnosis not present

## 2020-01-03 DIAGNOSIS — I2510 Atherosclerotic heart disease of native coronary artery without angina pectoris: Secondary | ICD-10-CM | POA: Diagnosis not present

## 2020-01-03 DIAGNOSIS — Z880 Allergy status to penicillin: Secondary | ICD-10-CM | POA: Insufficient documentation

## 2020-01-03 DIAGNOSIS — F419 Anxiety disorder, unspecified: Secondary | ICD-10-CM | POA: Diagnosis not present

## 2020-01-03 DIAGNOSIS — K317 Polyp of stomach and duodenum: Secondary | ICD-10-CM | POA: Insufficient documentation

## 2020-01-03 DIAGNOSIS — K766 Portal hypertension: Secondary | ICD-10-CM | POA: Diagnosis not present

## 2020-01-03 DIAGNOSIS — I10 Essential (primary) hypertension: Secondary | ICD-10-CM | POA: Insufficient documentation

## 2020-01-03 DIAGNOSIS — F329 Major depressive disorder, single episode, unspecified: Secondary | ICD-10-CM | POA: Insufficient documentation

## 2020-01-03 DIAGNOSIS — M109 Gout, unspecified: Secondary | ICD-10-CM | POA: Diagnosis not present

## 2020-01-03 DIAGNOSIS — Z79899 Other long term (current) drug therapy: Secondary | ICD-10-CM | POA: Insufficient documentation

## 2020-01-03 DIAGNOSIS — I8500 Esophageal varices without bleeding: Secondary | ICD-10-CM | POA: Diagnosis not present

## 2020-01-03 DIAGNOSIS — Z79890 Hormone replacement therapy (postmenopausal): Secondary | ICD-10-CM | POA: Insufficient documentation

## 2020-01-03 DIAGNOSIS — K219 Gastro-esophageal reflux disease without esophagitis: Secondary | ICD-10-CM | POA: Insufficient documentation

## 2020-01-03 DIAGNOSIS — M1990 Unspecified osteoarthritis, unspecified site: Secondary | ICD-10-CM | POA: Insufficient documentation

## 2020-01-03 DIAGNOSIS — E785 Hyperlipidemia, unspecified: Secondary | ICD-10-CM | POA: Diagnosis not present

## 2020-01-03 DIAGNOSIS — E039 Hypothyroidism, unspecified: Secondary | ICD-10-CM | POA: Diagnosis not present

## 2020-01-03 DIAGNOSIS — I252 Old myocardial infarction: Secondary | ICD-10-CM | POA: Insufficient documentation

## 2020-01-03 DIAGNOSIS — F418 Other specified anxiety disorders: Secondary | ICD-10-CM | POA: Diagnosis not present

## 2020-01-03 HISTORY — PX: ESOPHAGOGASTRODUODENOSCOPY (EGD) WITH PROPOFOL: SHX5813

## 2020-01-03 LAB — CBC
HCT: 33.7 % — ABNORMAL LOW (ref 36.0–46.0)
Hemoglobin: 10.7 g/dL — ABNORMAL LOW (ref 12.0–15.0)
MCH: 28.2 pg (ref 26.0–34.0)
MCHC: 31.8 g/dL (ref 30.0–36.0)
MCV: 88.7 fL (ref 80.0–100.0)
Platelets: 36 10*3/uL — ABNORMAL LOW (ref 150–400)
RBC: 3.8 MIL/uL — ABNORMAL LOW (ref 3.87–5.11)
RDW: 12.9 % (ref 11.5–15.5)
WBC: 1.7 10*3/uL — ABNORMAL LOW (ref 4.0–10.5)
nRBC: 0 % (ref 0.0–0.2)

## 2020-01-03 LAB — VITAMIN B12: Vitamin B-12: 264 pg/mL (ref 180–914)

## 2020-01-03 LAB — FERRITIN: Ferritin: 7 ng/mL — ABNORMAL LOW (ref 11–307)

## 2020-01-03 SURGERY — ESOPHAGOGASTRODUODENOSCOPY (EGD) WITH PROPOFOL
Anesthesia: General

## 2020-01-03 MED ORDER — LIDOCAINE HCL (CARDIAC) PF 100 MG/5ML IV SOSY
PREFILLED_SYRINGE | INTRAVENOUS | Status: DC | PRN
  Administered 2020-01-03: 100 mg via INTRATRACHEAL

## 2020-01-03 MED ORDER — SODIUM CHLORIDE 0.9 % IV SOLN: INTRAVENOUS | Status: DC

## 2020-01-03 MED ORDER — GLYCOPYRROLATE 0.2 MG/ML IJ SOLN
INTRAMUSCULAR | Status: DC | PRN
  Administered 2020-01-03: .2 mg via INTRAVENOUS

## 2020-01-03 MED ORDER — PROPOFOL 500 MG/50ML IV EMUL
INTRAVENOUS | Status: DC | PRN
  Administered 2020-01-03: 175 ug/kg/min via INTRAVENOUS

## 2020-01-03 MED ORDER — PROPOFOL 10 MG/ML IV BOLUS
INTRAVENOUS | Status: DC | PRN
  Administered 2020-01-03 (×3): 20 mg via INTRAVENOUS
  Administered 2020-01-03: 60 mg via INTRAVENOUS

## 2020-01-03 NOTE — H&P (Signed)
Cephas Darby, MD [address]  [place]  [place],  [address]  Main: [phone]  Fax: [phone] Pager: [phone]  Primary Care Physician:  Cletis Athens, MD Primary Gastroenterologist:  Dr. Cephas Darby  Pre-Procedure History & Physical: HPI:  Brandi Bright is a 49 y.o. female is here for an endoscopy.   Past Medical History:  Diagnosis Date  . Arthritis   . Back pain   . Gout   . Hypothyroidism   . MI, old   . Thyroid disease     Past Surgical History:  Procedure Laterality Date  . CARDIAC CATHETERIZATION    . CESAREAN SECTION    . COLONOSCOPY WITH PROPOFOL N/A 03/16/2018   Procedure: COLONOSCOPY WITH PROPOFOL;  Surgeon: Virgel Manifold, MD;  Location: [facility] ENDOSCOPY;  Service: Endoscopy;  Laterality: N/A;  . ESOPHAGOGASTRODUODENOSCOPY N/A 10/11/2019   Procedure: ESOPHAGOGASTRODUODENOSCOPY (EGD);  Surgeon: Lin Landsman, MD;  Location: [facility] ENDOSCOPY;  Service: Gastroenterology;  Laterality: N/A;  . ESOPHAGOGASTRODUODENOSCOPY (EGD) WITH PROPOFOL N/A 10/27/2018   Procedure: ESOPHAGOGASTRODUODENOSCOPY (EGD) WITH PROPOFOL;  Surgeon: Virgel Manifold, MD;  Location: [facility] ENDOSCOPY;  Service: Endoscopy;  Laterality: N/A;  . ESOPHAGOGASTRODUODENOSCOPY (EGD) WITH PROPOFOL N/A 06/25/2019   Procedure: ESOPHAGOGASTRODUODENOSCOPY (EGD) WITH PROPOFOL;  Surgeon: Jonathon Bellows, MD;  Location: [facility] ENDOSCOPY;  Service: Gastroenterology;  Laterality: N/A;  . ESOPHAGOGASTRODUODENOSCOPY (EGD) WITH PROPOFOL N/A 09/12/2019   Procedure: ESOPHAGOGASTRODUODENOSCOPY (EGD) WITH PROPOFOL;  Surgeon: Lucilla Lame, MD;  Location: [facility] ENDOSCOPY;  Service: Endoscopy;  Laterality: N/A;  . ESOPHAGOGASTRODUODENOSCOPY (EGD) WITH PROPOFOL N/A 11/20/2019   Procedure: ESOPHAGOGASTRODUODENOSCOPY (EGD) WITH PROPOFOL;  Surgeon: Virgel Manifold, MD;  Location: [facility] ENDOSCOPY;  Service: Endoscopy;  Laterality: N/A;  . ESOPHAGOGASTRODUODENOSCOPY (EGD) WITH PROPOFOL N/A 11/23/2019     Procedure: ESOPHAGOGASTRODUODENOSCOPY (EGD) WITH PROPOFOL;  Surgeon: Virgel Manifold, MD;  Location: [facility] ENDOSCOPY;  Service: Endoscopy;  Laterality: N/A;  . TUBAL LIGATION      Prior to Admission medications   Medication Sig Start Date End Date Taking? Authorizing Provider  [employer] VITAMIN B12 1000 MCG tablet TAKE 1 TABLET BY MOUTH EVERY DAY 08/13/19  Yes Earlie Server, MD  furosemide (LASIX) 20 MG tablet Take 1 tablet (20 mg total) by mouth daily. 10/12/19 10/11/20 Yes Mody, Sital, MD  Iron-Vitamin C (VITRON-C) 65-125 MG TABS Take 1 tablet by mouth daily. 08/15/19  Yes Earlie Server, MD  lactulose (CHRONULAC) 10 GM/15ML solution TAKE 15 MLS (10 G TOTAL) BY MOUTH DAILY FOR 30 DAYS. 08/24/19  Yes [name] B, MD  levothyroxine (SYNTHROID, LEVOTHROID) 200 MCG tablet Take 200 mcg by mouth daily. 11/26/17  Yes [provider]  nitroGLYCERIN (NITROLINGUAL) 0.4 MG/SPRAY spray Place 1 spray under the tongue every 5 (five) minutes x 3 doses as needed for chest pain.   Yes [provider]  pantoprazole (PROTONIX) 40 MG tablet Take 1 tablet (40 mg total) by mouth 2 (two) times daily. 10/17/19  Yes Virgel Manifold, MD  Sofosbuvir-Velpatasvir (EPCLUSA) 400-100 MG TABS Take 1 tablet by mouth daily. 08/30/19  Yes [name] B, MD  spironolactone (ALDACTONE) 50 MG tablet Take 50 mg by mouth daily. 08/21/18  Yes [provider]  venlafaxine XR (EFFEXOR-XR) 75 MG 24 hr capsule Take 75 mg by mouth daily.  08/22/18  Yes [provider]    Allergies as of 11/23/2019 - Review Complete 11/23/2019  Allergen Reaction Noted  . Vicodin [hydrocodone-acetaminophen] Rash 01/11/2016  . Amoxicillin Rash and Other (See Comments) 01/11/2016  . Gabapentin  Rash 01/11/2016    Family History  Problem Relation Age of Onset  . Hypertension Other   . CAD Father   . Colon cancer Father   . Lung cancer Father   . Pancreatic cancer Brother     Social History   Socioeconomic  History  . Marital status: Single    Spouse name: Not on file  . Number of children: Not on file  . Years of education: Not on file  . Highest education level: Not on file  Occupational History  . Not on file  Tobacco Use  . Smoking status: Never Smoker  . Smokeless tobacco: Never Used  Substance and Sexual Activity  . Alcohol use: Yes    Comment: rarely  . Drug use: Never  . Sexual activity: Not Currently  Other Topics Concern  . Not on file  Social History Narrative  . Not on file   Social Determinants of Health   Financial Resource Strain: Medium Risk  . Difficulty of Paying Living Expenses: Somewhat hard  Food Insecurity: No Food Insecurity  . Worried About Charity fundraiser in the Last Year: Never true  . Ran Out of Food in the Last Year: Never true  Transportation Needs: No Transportation Needs  . Lack of Transportation (Medical): No  . Lack of Transportation (Non-Medical): No  Physical Activity: Inactive  . Days of Exercise per Week: 0 days  . Minutes of Exercise per Session: 0 min  Stress: Stress Concern Present  . Feeling of Stress : Rather much  Social Connections: Unknown  . Frequency of Communication with Friends and Family: More than three times a week  . Frequency of Social Gatherings with Friends and Family: More than three times a week  . Attends Religious Services: Never  . Active Member of Clubs or Organizations: No  . Attends Archivist Meetings: Not asked  . Marital Status: Not on file  Intimate Partner Violence: Not At Risk  . Fear of Current or Ex-Partner: No  . Emotionally Abused: No  . Physically Abused: No  . Sexually Abused: No    Review of Systems: See HPI, otherwise negative ROS  Physical Exam: BP 116/78   Pulse 82   Temp 97.8 F (36.6 C) (Temporal)   Resp 16   Ht 4\' 9"  (1.448 m)   Wt 81.6 kg   LMP  (LMP Unknown) Comment: last menstrual 9 years ago  SpO2 98%   BMI 38.95 kg/m  General:   Alert,  pleasant and  cooperative in NAD Head:  Normocephalic and atraumatic. Neck:  Supple; no masses or thyromegaly. Lungs:  Clear throughout to auscultation.    Heart:  Regular rate and rhythm. Abdomen:  Soft, nontender and nondistended. Normal bowel sounds, without guarding, and without rebound.   Neurologic:  Alert and  oriented x4;  grossly normal neurologically.  Impression/Plan: [name] is here for an endoscopy to be performed for variceal surveillance  Risks, benefits, limitations, and alternatives regarding  endoscopy have been reviewed with the patient.  Questions have been answered.  All parties agreeable.   Sherri Sear, MD  01/03/2020, 10:25 AM

## 2020-01-03 NOTE — Telephone Encounter (Signed)
-----   Message from Lin Landsman, MD sent at 01/03/2020  4:44 PM EST ----- Please inform patient that her hemoglobin is low compared to December.  Iron is still low despite being on oral iron.  She should also take B12 1000MCG daily.  Let her know that I informed Dr. Tasia Catchings to arrange for iron infusion.  She should follow-up with Dr. Tasia Catchings

## 2020-01-03 NOTE — Transfer of Care (Signed)
Immediate Anesthesia Transfer of Care Note  Patient: Brandi Bright  Procedure(s) Performed: ESOPHAGOGASTRODUODENOSCOPY (EGD) WITH PROPOFOL (N/A )  Patient Location: Endoscopy Unit  Anesthesia Type:General  Level of Consciousness: awake, alert  and patient cooperative  Airway & Oxygen Therapy: Patient Spontanous Breathing  Post-op Assessment: Report given to RN and Post -op Vital signs reviewed and stable  Post vital signs: Reviewed and stable  Last Vitals:  Vitals Value Taken Time  BP 111/67 01/03/20 1202  Temp 36.8 C 01/03/20 1200  Pulse 94 01/03/20 1205  Resp 23 01/03/20 1205  SpO2 95 % 01/03/20 1205  Vitals shown include unvalidated device data.  Last Pain:  Vitals:   01/03/20 1200  TempSrc: Temporal  PainSc: 0-No pain         Complications: No apparent anesthesia complications

## 2020-01-03 NOTE — Telephone Encounter (Signed)
Patient verbalized understanding of results. Patient states she will start taking vitamin b12

## 2020-01-03 NOTE — Anesthesia Preprocedure Evaluation (Signed)
Anesthesia Evaluation  Patient identified by MRN, date of birth, ID band Patient awake    Reviewed: Allergy & Precautions, NPO status , Patient's Chart, lab work & pertinent test results  History of Anesthesia Complications Negative for: history of anesthetic complications  Airway Mallampati: III       Dental   Pulmonary neg sleep apnea, neg COPD, Not current smoker,           Cardiovascular hypertension, Pt. on medications and Pt. on home beta blockers + CAD and + Past MI       Neuro/Psych neg Seizures Anxiety Depression    GI/Hepatic GERD  Medicated and Controlled,(+) Hepatitis -, C  Endo/Other  neg diabetesHypothyroidism   Renal/GU negative Renal ROS     Musculoskeletal   Abdominal   Peds  Hematology  (+) anemia ,   Anesthesia Other Findings   Reproductive/Obstetrics                            Anesthesia Physical Anesthesia Plan  ASA: III  Anesthesia Plan: General   Post-op Pain Management:    Induction: Intravenous  PONV Risk Score and Plan: 3 and Propofol infusion, TIVA and Treatment may vary due to age or medical condition  Airway Management Planned: Nasal Cannula  Additional Equipment:   Intra-op Plan:   Post-operative Plan:   Informed Consent: I have reviewed the patients History and Physical, chart, labs and discussed the procedure including the risks, benefits and alternatives for the proposed anesthesia with the patient or authorized representative who has indicated his/her understanding and acceptance.       Plan Discussed with:   Anesthesia Plan Comments:         Anesthesia Quick Evaluation

## 2020-01-03 NOTE — Anesthesia Postprocedure Evaluation (Signed)
Anesthesia Post Note  Patient: Brandi Bright  Procedure(s) Performed: ESOPHAGOGASTRODUODENOSCOPY (EGD) WITH PROPOFOL (N/A )  Patient location during evaluation: Endoscopy Anesthesia Type: General Level of consciousness: awake and alert Pain management: pain level controlled Vital Signs Assessment: post-procedure vital signs reviewed and stable Respiratory status: spontaneous breathing and respiratory function stable Cardiovascular status: stable Anesthetic complications: no     Last Vitals:  Vitals:   01/03/20 1210 01/03/20 1220  BP: 108/70 127/81  Pulse:    Resp: 16   Temp:    SpO2:      Last Pain:  Vitals:   01/03/20 1220  TempSrc:   PainSc: 0-No pain                 Jettie Lazare K

## 2020-01-03 NOTE — Op Note (Signed)
[facility] Gastroenterology Patient Name: Brandi Bright Procedure Date: 01/03/2020 11:42 AM MRN: [id number] Account #: [id number] Date of Birth: 1971/03/24 Admit Type: Outpatient Age: 49 Room: [facility] ENDO ROOM 2 Gender: Female Note Status: Finalized Procedure:             Upper GI endoscopy Indications:           Follow-up of esophageal varices Providers:             Lin Landsman MD, MD Referring MD:          Cletis Athens, MD (Referring MD) Medicines:             Monitored Anesthesia Care Complications:         No immediate complications. Estimated blood loss: None. Procedure:             Pre-Anesthesia Assessment:                        - Prior to the procedure, a History and Physical was                         performed, and patient medications and allergies were                         reviewed. The patient is competent. The risks and                         benefits of the procedure and the sedation options and                         risks were discussed with the patient. All questions                         were answered and informed consent was obtained.                         Patient identification and proposed procedure were                         verified by the physician, the nurse, the                         anesthesiologist, the anesthetist and the technician                         in the pre-procedure area in the procedure room in the                         endoscopy suite. Mental Status Examination: alert and                         oriented. Airway Examination: normal oropharyngeal                         airway and neck mobility. Respiratory Examination:                         clear to auscultation. CV Examination: normal.  Prophylactic Antibiotics: The patient does not require                         prophylactic antibiotics. Prior Anticoagulants: The                         patient has taken no previous  anticoagulant or                         antiplatelet agents. ASA Grade Assessment: III - A                         patient with severe systemic disease. After reviewing                         the risks and benefits, the patient was deemed in                         satisfactory condition to undergo the procedure. The                         anesthesia plan was to use monitored anesthesia care                         (MAC). Immediately prior to administration of                         medications, the patient was re-assessed for adequacy                         to receive sedatives. The heart rate, respiratory                         rate, oxygen saturations, blood pressure, adequacy of                         pulmonary ventilation, and response to care were                         monitored throughout the procedure. The physical                         status of the patient was re-assessed after the                         procedure.                        After obtaining informed consent, the endoscope was                         passed under direct vision. Throughout the procedure,                         the patient's blood pressure, pulse, and oxygen                         saturations were monitored continuously. The Endoscope  was introduced through the mouth, and advanced to the                         second part of duodenum. The upper GI endoscopy was                         accomplished without difficulty. The patient tolerated                         the procedure well. Findings:      The duodenal bulb and second portion of the duodenum were normal.      Multiple diminutive sessile polyps with no bleeding and stigmata of       recent bleeding were found in the gastric fundus and in the gastric body.      Mild portal hypertensive gastropathy was found in the gastric antrum.      The cardia and gastric fundus were normal on retroflexion.      Two columns  of small (< 5 mm) varices with no bleeding and no stigmata       of recent bleeding were found in the lower third of the esophagus, 38 cm       from the incisors. No red wale signs were present. Scarring from prior       treatment was visible at 33cm from incisors. The varices appeared       smaller distal to the scarring than they were at prior exam. Banding was       not performed as varices were small and platelets today were 36 Impression:            - Normal duodenal bulb and second portion of the                         duodenum.                        - Multiple gastric polyps.                        - Portal hypertensive gastropathy.                        - Small (< 5 mm) esophageal varices with no bleeding                         and no stigmata of recent bleeding.                        - No specimens collected. Recommendation:        - Discharge patient to home (with escort).                        - Low fat diet and low sodium diet.                        - Continue present medications.                        - Repeat upper endoscopy in 3 months for surveillance.                        -  Return to GI office as previously scheduled. Procedure Code(s):     --- Professional ---                        [phone], Esophagogastroduodenoscopy, flexible,                         transoral; diagnostic, including collection of                         specimen(s) by brushing or washing, when performed                         (separate procedure) Diagnosis Code(s):     --- Professional ---                        K31.7, Polyp of stomach and duodenum                        K76.6, Portal hypertension                        K31.89, Other diseases of stomach and duodenum                        I85.00, Esophageal varices without bleeding CPT copyright 2019 [employer]. All rights reserved. The codes documented in this report are preliminary and upon coder review may  be revised to  meet current compliance requirements. Dr. Ulyess Mort Lin Landsman MD, MD 01/03/2020 12:01:28 PM This report has been signed electronically. Number of Addenda: 0 Note Initiated On: 01/03/2020 11:42 AM Estimated Blood Loss:  Estimated blood loss: none. Estimated blood loss: none.      [facility]

## 2020-01-04 ENCOUNTER — Encounter: Payer: Self-pay | Admitting: *Deleted

## 2020-01-10 ENCOUNTER — Observation Stay
Admission: EM | Admit: 2020-01-10 | Discharge: 2020-01-10 | Disposition: A | Discharge status: Home / Self Care | Payer: Medicaid Other | Attending: Internal Medicine | Admitting: Internal Medicine

## 2020-01-10 ENCOUNTER — Encounter: Payer: Self-pay | Admitting: Emergency Medicine

## 2020-01-10 ENCOUNTER — Other Ambulatory Visit: Payer: Self-pay

## 2020-01-10 ENCOUNTER — Emergency Department: Payer: Medicaid Other

## 2020-01-10 ENCOUNTER — Observation Stay: Admit: 2020-01-10 | Payer: Medicaid Other

## 2020-01-10 ENCOUNTER — Observation Stay: Payer: Medicaid Other

## 2020-01-10 DIAGNOSIS — M109 Gout, unspecified: Secondary | ICD-10-CM | POA: Insufficient documentation

## 2020-01-10 DIAGNOSIS — K7469 Other cirrhosis of liver: Secondary | ICD-10-CM | POA: Diagnosis present

## 2020-01-10 DIAGNOSIS — K7460 Unspecified cirrhosis of liver: Secondary | ICD-10-CM | POA: Diagnosis not present

## 2020-01-10 DIAGNOSIS — R079 Chest pain, unspecified: Secondary | ICD-10-CM | POA: Diagnosis not present

## 2020-01-10 DIAGNOSIS — F329 Major depressive disorder, single episode, unspecified: Secondary | ICD-10-CM | POA: Insufficient documentation

## 2020-01-10 DIAGNOSIS — Z79890 Hormone replacement therapy (postmenopausal): Secondary | ICD-10-CM | POA: Diagnosis not present

## 2020-01-10 DIAGNOSIS — I2510 Atherosclerotic heart disease of native coronary artery without angina pectoris: Secondary | ICD-10-CM | POA: Diagnosis present

## 2020-01-10 DIAGNOSIS — R778 Other specified abnormalities of plasma proteins: Secondary | ICD-10-CM | POA: Diagnosis not present

## 2020-01-10 DIAGNOSIS — D696 Thrombocytopenia, unspecified: Secondary | ICD-10-CM | POA: Diagnosis present

## 2020-01-10 DIAGNOSIS — F419 Anxiety disorder, unspecified: Secondary | ICD-10-CM | POA: Insufficient documentation

## 2020-01-10 DIAGNOSIS — K766 Portal hypertension: Secondary | ICD-10-CM | POA: Diagnosis not present

## 2020-01-10 DIAGNOSIS — M1990 Unspecified osteoarthritis, unspecified site: Secondary | ICD-10-CM | POA: Diagnosis not present

## 2020-01-10 DIAGNOSIS — R0789 Other chest pain: Secondary | ICD-10-CM | POA: Diagnosis not present

## 2020-01-10 DIAGNOSIS — R109 Unspecified abdominal pain: Secondary | ICD-10-CM | POA: Diagnosis not present

## 2020-01-10 DIAGNOSIS — Z20822 Contact with and (suspected) exposure to covid-19: Secondary | ICD-10-CM | POA: Diagnosis not present

## 2020-01-10 DIAGNOSIS — E039 Hypothyroidism, unspecified: Secondary | ICD-10-CM | POA: Diagnosis not present

## 2020-01-10 DIAGNOSIS — R7989 Other specified abnormal findings of blood chemistry: Secondary | ICD-10-CM | POA: Diagnosis not present

## 2020-01-10 DIAGNOSIS — Z79899 Other long term (current) drug therapy: Secondary | ICD-10-CM | POA: Diagnosis not present

## 2020-01-10 DIAGNOSIS — I25118 Atherosclerotic heart disease of native coronary artery with other forms of angina pectoris: Secondary | ICD-10-CM | POA: Diagnosis not present

## 2020-01-10 DIAGNOSIS — I8500 Esophageal varices without bleeding: Secondary | ICD-10-CM | POA: Diagnosis present

## 2020-01-10 DIAGNOSIS — I252 Old myocardial infarction: Secondary | ICD-10-CM | POA: Insufficient documentation

## 2020-01-10 DIAGNOSIS — D72819 Decreased white blood cell count, unspecified: Secondary | ICD-10-CM | POA: Diagnosis not present

## 2020-01-10 LAB — URINALYSIS, COMPLETE (UACMP) WITH MICROSCOPIC
Bacteria, UA: NONE SEEN
Bilirubin Urine: NEGATIVE
Glucose, UA: NEGATIVE mg/dL
Hgb urine dipstick: NEGATIVE
Ketones, ur: NEGATIVE mg/dL
Leukocytes,Ua: NEGATIVE
Nitrite: NEGATIVE
Protein, ur: NEGATIVE mg/dL
Specific Gravity, Urine: 1.019 (ref 1.005–1.030)
pH: 6 (ref 5.0–8.0)

## 2020-01-10 LAB — CBC
HCT: 34.2 % — ABNORMAL LOW (ref 36.0–46.0)
Hemoglobin: 11.1 g/dL — ABNORMAL LOW (ref 12.0–15.0)
MCH: 28.3 pg (ref 26.0–34.0)
MCHC: 32.5 g/dL (ref 30.0–36.0)
MCV: 87.2 fL (ref 80.0–100.0)
Platelets: 45 10*3/uL — ABNORMAL LOW (ref 150–400)
RBC: 3.92 MIL/uL (ref 3.87–5.11)
RDW: 12.8 % (ref 11.5–15.5)
WBC: 2.4 10*3/uL — ABNORMAL LOW (ref 4.0–10.5)
nRBC: 0 % (ref 0.0–0.2)

## 2020-01-10 LAB — COMPREHENSIVE METABOLIC PANEL
ALT: 19 U/L (ref 0–44)
AST: 39 U/L (ref 15–41)
Albumin: 3.3 g/dL — ABNORMAL LOW (ref 3.5–5.0)
Alkaline Phosphatase: 91 U/L (ref 38–126)
Anion gap: 4 — ABNORMAL LOW (ref 5–15)
BUN: 8 mg/dL (ref 6–20)
CO2: 27 mmol/L (ref 22–32)
Calcium: 8.8 mg/dL — ABNORMAL LOW (ref 8.9–10.3)
Chloride: 109 mmol/L (ref 98–111)
Creatinine, Ser: 0.48 mg/dL (ref 0.44–1.00)
GFR calc Af Amer: >60 mL/min (ref >60)
GFR calc non Af Amer: >60 mL/min (ref >60)
Glucose, Bld: 111 mg/dL — ABNORMAL HIGH (ref 70–99)
Potassium: 3.6 mmol/L (ref 3.5–5.1)
Sodium: 140 mmol/L (ref 135–145)
Total Bilirubin: 0.9 mg/dL (ref 0.3–1.2)
Total Protein: 6.6 g/dL (ref 6.5–8.1)

## 2020-01-10 LAB — SARS CORONAVIRUS 2 (TAT 6-24 HRS): SARS Coronavirus 2: NEGATIVE

## 2020-01-10 LAB — LIPASE, BLOOD: Lipase: 69 U/L — ABNORMAL HIGH (ref 11–51)

## 2020-01-10 LAB — TROPONIN I (HIGH SENSITIVITY)
Troponin I (High Sensitivity): 99 ng/L — ABNORMAL HIGH (ref <18)
Troponin I (High Sensitivity): 105 ng/L (ref <18)

## 2020-01-10 MED ORDER — ONDANSETRON HCL 4 MG/2ML IJ SOLN
4.0000 mg | Freq: Once | INTRAMUSCULAR | Status: AC
  Administered 2020-01-10: 4 mg via INTRAVENOUS
  Filled 2020-01-10: qty 2

## 2020-01-10 MED ORDER — ASPIRIN 81 MG PO CHEW
324.0000 mg | CHEWABLE_TABLET | ORAL | Status: AC
  Administered 2020-01-10: 324 mg via ORAL
  Filled 2020-01-10: qty 4

## 2020-01-10 MED ORDER — ASPIRIN 81 MG PO TBEC: 81.0000 mg | DELAYED_RELEASE_TABLET | Freq: Every day | ORAL | 0 refills | Status: DC

## 2020-01-10 MED ORDER — MORPHINE SULFATE (PF) 2 MG/ML IV SOLN
2.0000 mg | Freq: Once | INTRAVENOUS | Status: AC
  Administered 2020-01-10: 2 mg via INTRAVENOUS
  Filled 2020-01-10: qty 1

## 2020-01-10 MED ORDER — ASPIRIN EC 81 MG PO TBEC: 81.0000 mg | DELAYED_RELEASE_TABLET | Freq: Every day | ORAL | Status: DC

## 2020-01-10 MED ORDER — NITROGLYCERIN 0.4 MG SL SUBL: 0.4000 mg | SUBLINGUAL_TABLET | SUBLINGUAL | Status: DC | PRN

## 2020-01-10 MED ORDER — ONDANSETRON HCL 4 MG/2ML IJ SOLN: 4.0000 mg | Freq: Four times a day (QID) | INTRAMUSCULAR | Status: DC | PRN

## 2020-01-10 MED ORDER — IOHEXOL 9 MG/ML PO SOLN
500.0000 mL | ORAL | Status: AC
  Administered 2020-01-10 (×2): 500 mL via ORAL
  Filled 2020-01-10 (×2): qty 500

## 2020-01-10 MED ORDER — ASPIRIN 300 MG RE SUPP: 300.0000 mg | RECTAL | Status: AC

## 2020-01-10 MED ORDER — OXYCODONE HCL 5 MG PO TABS: 5.0000 mg | ORAL_TABLET | Freq: Once | ORAL | Status: DC

## 2020-01-10 MED ORDER — IOHEXOL 300 MG/ML  SOLN
100.0000 mL | Freq: Once | INTRAMUSCULAR | Status: AC | PRN
  Administered 2020-01-10: 100 mL via INTRAVENOUS
  Filled 2020-01-10: qty 100

## 2020-01-10 NOTE — H&P (Signed)
History and Physical    Brandi Bright [id number] DOB: [birth year] DOA: 01/10/2020  PCP: Cletis Athens, MD   Patient coming from: home  I have personally briefly reviewed patient's old medical records in [facility]  Chief Complaint: chest pain, upper abdominal pain  HPI: Brandi Bright is a 49 y.o. female with medical history significant for CAD with history of MI x2 with no PCI, history of liver cirrhosis and esophageal varices status post banding October 2020, with follow-up EGD on 01/03/2020 showing varices less than 5 mm, gastric polyps and hypertensive gastropathy, as well as history of hypothyroidism who presents to the emergency room with a 3-day history of intermittent left-sided lateral pleuritic chest pain and upper abdominal pain, that worsened on the night of arrival to 6/10.  It is nonradiating and worsened on movement and on taking deep breath.,  It is associated with nausea and shortness of breath.  She denied vomiting or change in bowel habits.  Denied hematemesis, melena or blood in the stool.  She had no associated lightheadedness.  ED Course: On arrival in the emergency room she was afebrile with a blood pressure 158/99, heart rate 104 O2 sat 98% on room air.  Blood work was notable for leukopenia of 2.4, thrombocytopenia 45.  Hemoglobin 11.1.  Lipase was 69 with otherwise normal chemistries.  Troponin resulted at 99.  EKG showed normal sinus rhythm with no acute ST-T wave changes.  Due to concern for elevated troponin in the setting of history of MI, observation requested.  Due to history of variceal banding for bleeding in October 2020, Heparin was not administered for possibility of ACS/NSTEMI   Review of Systems: As per HPI otherwise 10 point review of systems negative.    Past Medical History:  Diagnosis Date  . Arthritis   . Back pain   . Gout   . Hypothyroidism   . MI, old   . Thyroid disease     Past Surgical History:  Procedure Laterality Date  .  CARDIAC CATHETERIZATION    . CESAREAN SECTION    . COLONOSCOPY WITH PROPOFOL N/A 03/16/2018   Procedure: COLONOSCOPY WITH PROPOFOL;  Surgeon: Virgel Manifold, MD;  Location: [facility] ENDOSCOPY;  Service: Endoscopy;  Laterality: N/A;  . ESOPHAGOGASTRODUODENOSCOPY N/A 10/11/2019   Procedure: ESOPHAGOGASTRODUODENOSCOPY (EGD);  Surgeon: Lin Landsman, MD;  Location[facility] ENDOSCOPY;  Service: Gastroenterology;  Laterality: N/A;  . ESOPHAGOGASTRODUODENOSCOPY (EGD) WITH PROPOFOL N/A 10/27/2018   Procedure: ESOPHAGOGASTRODUODENOSCOPY (EGD) WITH PROPOFOL;  Surgeon: Virgel Manifold, MD;  Location: [facility] ENDOSCOPY;  Service: Endoscopy;  Laterality: N/A;  . ESOPHAGOGASTRODUODENOSCOPY (EGD) WITH PROPOFOL N/A 06/25/2019   Procedure: ESOPHAGOGASTRODUODENOSCOPY (EGD) WITH PROPOFOL;  Surgeon: Jonathon Bellows, MD;  Location: [facility] ENDOSCOPY;  Service: Gastroenterology;  Laterality: N/A;  . ESOPHAGOGASTRODUODENOSCOPY (EGD) WITH PROPOFOL N/A 09/12/2019   Procedure: ESOPHAGOGASTRODUODENOSCOPY (EGD) WITH PROPOFOL;  Surgeon: Lucilla Lame, MD;  Location: [facility] ENDOSCOPY;  Service: Endoscopy;  Laterality: N/A;  . ESOPHAGOGASTRODUODENOSCOPY (EGD) WITH PROPOFOL N/A 11/20/2019   Procedure: ESOPHAGOGASTRODUODENOSCOPY (EGD) WITH PROPOFOL;  Surgeon: Virgel Manifold, MD;  Location: [facility] ENDOSCOPY;  Service: Endoscopy;  Laterality: N/A;  . ESOPHAGOGASTRODUODENOSCOPY (EGD) WITH PROPOFOL N/A 11/23/2019   Procedure: ESOPHAGOGASTRODUODENOSCOPY (EGD) WITH PROPOFOL;  Surgeon: Virgel Manifold, MD;  Location: [facility] ENDOSCOPY;  Service: Endoscopy;  Laterality: N/A;  . ESOPHAGOGASTRODUODENOSCOPY (EGD) WITH PROPOFOL N/A 01/03/2020   Procedure: ESOPHAGOGASTRODUODENOSCOPY (EGD) WITH PROPOFOL;  Surgeon: Lin Landsman, MD;  Location: [facility] ENDOSCOPY;  Service: Endoscopy;  Laterality: N/A;  . TUBAL LIGATION  reports that she has never smoked. She has never used smokeless tobacco. She reports current alcohol use. She reports  that she does not use drugs.  Allergies  Allergen Reactions  . Vicodin [Hydrocodone-Acetaminophen] Rash  . Amoxicillin Rash and Other (See Comments)    Has patient had a PCN reaction causing immediate rash, facial/tongue/throat swelling, SOB or lightheadedness with hypotension: Yes Has patient had a PCN reaction causing severe rash involving mucus membranes or skin necrosis: No Has patient had a PCN reaction that required hospitalization: No Has patient had a PCN reaction occurring within the last 10 years: No If all of the above answers are "NO", then may proceed with Cephalosporin use.   . Gabapentin Rash    Family History  Problem Relation Age of Onset  . Hypertension Other   . CAD Father   . Colon cancer Father   . Lung cancer Father   . Pancreatic cancer Brother      Prior to Admission medications   Medication Sig Start Date End Date Taking? Authorizing Provider  [facility] VITAMIN B12 1000 MCG tablet TAKE 1 TABLET BY MOUTH EVERY DAY 08/13/19   Earlie Server, MD  furosemide (LASIX) 20 MG tablet Take 1 tablet (20 mg total) by mouth daily. 10/12/19 10/11/20  Bettey Costa, MD  Iron-Vitamin C (VITRON-C) 65-125 MG TABS Take 1 tablet by mouth daily. 08/15/19   Earlie Server, MD  lactulose (CHRONULAC) 10 GM/15ML solution TAKE 15 MLS (10 G TOTAL) BY MOUTH DAILY FOR 30 DAYS. 08/24/19   Virgel Manifold, MD  levothyroxine (SYNTHROID, LEVOTHROID) 200 MCG tablet Take 200 mcg by mouth daily. 11/26/17   [provider]  nitroGLYCERIN (NITROLINGUAL) 0.4 MG/SPRAY spray Place 1 spray under the tongue every 5 (five) minutes x 3 doses as needed for chest pain.    [provider]  pantoprazole (PROTONIX) 40 MG tablet Take 1 tablet (40 mg total) by mouth 2 (two) times daily. 10/17/19   Virgel Manifold, MD  Sofosbuvir-Velpatasvir (EPCLUSA) 400-100 MG TABS Take 1 tablet by mouth daily. 08/30/19   Virgel Manifold, MD  spironolactone (ALDACTONE) 50 MG tablet Take 50 mg by mouth daily. 08/21/18    [provider]  venlafaxine XR (EFFEXOR-XR) 75 MG 24 hr capsule Take 75 mg by mouth daily.  08/22/18   [provider]    Physical Exam: Vitals:   01/10/20 0157  BP: (!) 158/99  Pulse: (!) 104  Resp: 18  Temp: 98.4 F (36.9 C)  TempSrc: Oral  SpO2: 98%     Vitals:   01/10/20 0157  BP: (!) 158/99  Pulse: (!) 104  Resp: 18  Temp: 98.4 F (36.9 C)  TempSrc: Oral  SpO2: 98%    Constitutional: NAD, alert and oriented x 3 Eyes: PERRL, lids and conjunctivae normal ENMT: Mucous membranes are moist.  Neck: normal, supple, no masses, no thyromegaly Respiratory: clear to auscultation bilaterally, no wheezing, no crackles. Normal respiratory effort. No accessory muscle use.  Cardiovascular: Regular rate and rhythm, no murmurs / rubs / gallops. No extremity edema. 2+ pedal pulses. No carotid bruits. Pain is reproducible on palpation Abdomen: no tenderness, no masses palpated. No hepatosplenomegaly. Bowel sounds positive.  Musculoskeletal: no clubbing / cyanosis. No joint deformity upper and lower extremities.  Skin: no rashes, lesions, ulcers.  Neurologic: No gross focal neurologic deficit. Psychiatric: Normal mood and affect.   Labs on Admission: I have personally reviewed following labs and imaging studies  CBC: Recent Labs  Lab 01/03/20 1052 01/10/20  0159  WBC 1.7* 2.4*  HGB 10.7* 11.1*  HCT 33.7* 34.2*  MCV 88.7 87.2  PLT 36* 45*   Basic Metabolic Panel: Recent Labs  Lab 01/10/20 0159  NA 140  K 3.6  CL 109  CO2 27  GLUCOSE 111*  BUN 8  CREATININE 0.48  CALCIUM 8.8*   GFR: Estimated Creatinine Clearance: 74.9 mL/min (by C-G formula based on SCr of 0.48 mg/dL). Liver Function Tests: Recent Labs  Lab 01/10/20 0159  AST 39  ALT 19  ALKPHOS 91  BILITOT 0.9  PROT 6.6  ALBUMIN 3.3*   Recent Labs  Lab 01/10/20 0159  LIPASE 69*   No results for input(s): AMMONIA in the last 168 hours. Coagulation Profile: No results for input(s):  INR, PROTIME in the last 168 hours. Cardiac Enzymes: No results for input(s): CKTOTAL, CKMB, CKMBINDEX, TROPONINI in the last 168 hours. BNP (last 3 results) No results for input(s): PROBNP in the last 8760 hours. HbA1C: No results for input(s): HGBA1C in the last 72 hours. CBG: No results for input(s): GLUCAP in the last 168 hours. Lipid Profile: No results for input(s): CHOL, HDL, LDLCALC, TRIG, CHOLHDL, LDLDIRECT in the last 72 hours. Thyroid Function Tests: No results for input(s): TSH, T4TOTAL, FREET4, T3FREE, THYROIDAB in the last 72 hours. Anemia Panel: No results for input(s): VITAMINB12, FOLATE, FERRITIN, TIBC, IRON, RETICCTPCT in the last 72 hours. Urine analysis:    Component Value Date/Time   COLORURINE YELLOW (A) 01/10/2020 0159   APPEARANCEUR CLEAR (A) 01/10/2020 0159   APPEARANCEUR Cloudy (A) 08/30/2019 1336   LABSPEC 1.019 01/10/2020 0159   LABSPEC 1.023 05/30/2013 1242   PHURINE 6.0 01/10/2020 0159   GLUCOSEU NEGATIVE 01/10/2020 0159   GLUCOSEU Negative 05/30/2013 1242   HGBUR NEGATIVE 01/10/2020 0159   BILIRUBINUR NEGATIVE 01/10/2020 0159   BILIRUBINUR Negative 08/30/2019 1336   BILIRUBINUR Negative 05/30/2013 1242   KETONESUR NEGATIVE 01/10/2020 0159   PROTEINUR NEGATIVE 01/10/2020 0159   NITRITE NEGATIVE 01/10/2020 0159   LEUKOCYTESUR NEGATIVE 01/10/2020 0159   LEUKOCYTESUR 1+ 05/30/2013 1242    Radiological Exams on Admission: DG Chest 2 View  Result Date: 01/10/2020 CLINICAL DATA:  Left upper abdominal pain, chest pain EXAM: CHEST - 2 VIEW COMPARISON:  12/29/2017 FINDINGS: Heart and mediastinal contours are within normal limits. No focal opacities or effusions. No acute bony abnormality. IMPRESSION: Normal study. Electronically Signed   By: Rolm Baptise M.D.   On: 01/10/2020 02:24    EKG: Independently reviewed.   Assessment/Plan Principal Problem: Chest pain   Coronary artery disease   Elevated troponin Patient presented with left-sided chest  pain somewhat atypical/pleuritic History of negative stress test for atypical chest pain in January 2019 Aspirin with caution in view of thrombocytopenia Continue to cycle troponins  Cardialogy consult if uptrending  Hyperlipasemia Mild elevation in lipase Possibly reactive but continue to monitor Clear liquid diet    Non decompensated cirrhosis of liver (HCC)   Esophageal varices without bleeding (HCC)  Leukopenia tha thrombocytopenia Portal hypertension Currently appears to be at baseline Patient had EGD on 01/03/2020 that showed no acute findings but the varices less than 5 mm Continue to monitor  Acquired hypothyroidism Continue home meds        DVT prophylaxis: scd  Code Status: full code  Family Communication: none  Disposition Plan: Back to previous home environment Consults called: Dr Neoma Laming, cardiologist     Athena Masse MD [facility]     01/10/2020, 3:52 AM

## 2020-01-10 NOTE — Discharge Summary (Signed)
Physician Discharge Summary  Brandi Bright [id number] DOB: Apr 10, 1971 DOA: 01/10/2020  PCP: Cletis Athens, MD  Admit date: 01/10/2020 Discharge date: 01/10/2020  Admitted From: Home Disposition: Home  Recommendations for Outpatient Follow-up:  1. Follow up with PCP in 1-2 weeks 2. Please obtain BMP/CBC in one week 3. Please follow up on the following pending results: None  Home Health: No Equipment/Devices: None Discharge Condition: Stable CODE STATUS: Full Diet recommendation: Heart Healthy    Brief/Interim Summary: Brandi Bright is a 49 y.o. female with medical history significant for CAD with history of MI x2 with no PCI, history of liver cirrhosis and esophageal varices status post banding October 2020, with follow-up EGD on 01/03/2020 showing varices less than 5 mm, gastric polyps and hypertensive gastropathy, as well as history of hypothyroidism who presents to the emergency room with a 3-day history of intermittent left-sided lateral pleuritic chest pain and upper abdominal pain, that worsened on the night of arrival. She had atypical chest pain with mildly elevated troponin and EKG without any acute change.  Most likely demand.  Cardiology was consulted because of her prior extensive cardiac history, they are recommending outpatient work-up.  Appointment was provided by her cardiologist for tomorrow.  She had an history of falling over and hurting her left lower rib cage.  Her lipase was mildly elevated.  No nausea vomiting or diarrhea.  She does not use alcohol. Has an history of well compensated liver cirrhosis without any acute issues. CT abdomen was obtained which was without any acute abnormalities and consistent with her chronic liver cirrhosis.  Patient was able to tolerate diet well. She was discharged home to resume follow-up with PCP and her cardiologist.  Discharge Diagnoses:  Principal Problem:   Chest wall pain Active Problems:   Thrombocytopenia (HCC)    Leukopenia   Other cirrhosis of liver (HCC)   Esophageal varices without bleeding (HCC)   Coronary artery disease   Hypothyroid   Portal hypertension (HCC)   Elevated troponin  Discharge Instructions  Discharge Instructions    Diet - low sodium heart healthy   Complete by: As directed    Discharge instructions   Complete by: As directed    It was pleasure taking care of you. Your CT abdomen did not show any inflammation around pancreas.  It was consistent with your existing condition of cirrhosis. Please follow-up with your cardiologist according to your schedule appointment. Continue your home medications.   Increase activity slowly   Complete by: As directed      Allergies as of 01/10/2020      Reactions   Vicodin [hydrocodone-acetaminophen] Rash   Amoxicillin Rash, Other (See Comments)   Has patient had a PCN reaction causing immediate rash, facial/tongue/throat swelling, SOB or lightheadedness with hypotension: Yes Has patient had a PCN reaction causing severe rash involving mucus membranes or skin necrosis: No Has patient had a PCN reaction that required hospitalization: No Has patient had a PCN reaction occurring within the last 10 years: No If all of the above answers are "NO", then may proceed with Cephalosporin use.   Gabapentin Rash      Medication List    STOP taking these medications   furosemide 20 MG tablet Commonly known as: Lasix     TAKE these medications   aspirin 81 MG EC tablet Take 1 tablet (81 mg total) by mouth daily. Start taking on: January 11, 2020   [employer] VITAMIN B12 1000 MCG tablet Generic drug: cyanocobalamin TAKE 1  TABLET BY MOUTH EVERY DAY   diclofenac Sodium 1 % Gel Commonly known as: VOLTAREN Apply 5 g topically 4 (four) times daily.   lactulose 10 GM/15ML solution Commonly known as: CHRONULAC TAKE 15 MLS (10 G TOTAL) BY MOUTH DAILY FOR 30 DAYS.   levothyroxine 200 MCG tablet Commonly known as: SYNTHROID Take 200 mcg by mouth  daily.   nitroGLYCERIN 0.4 MG/SPRAY spray Commonly known as: NITROLINGUAL Place 1 spray under the tongue every 5 (five) minutes x 3 doses as needed for chest pain.   oxyCODONE-acetaminophen 5-325 MG tablet Commonly known as: PERCOCET/ROXICET Take 1 tablet by mouth 5 (five) times daily.   pantoprazole 40 MG tablet Commonly known as: PROTONIX Take 1 tablet (40 mg total) by mouth 2 (two) times daily.   Sofosbuvir-Velpatasvir 400-100 MG Tabs Commonly known as: Epclusa Take 1 tablet by mouth daily.   spironolactone 50 MG tablet Commonly known as: ALDACTONE Take 50 mg by mouth daily.   venlafaxine XR 75 MG 24 hr capsule Commonly known as: EFFEXOR-XR Take 75 mg by mouth daily.   Vitron-C 65-125 MG Tabs Generic drug: Iron-Vitamin C Take 1 tablet by mouth daily.       Allergies  Allergen Reactions  . Vicodin [Hydrocodone-Acetaminophen] Rash  . Amoxicillin Rash and Other (See Comments)    Has patient had a PCN reaction causing immediate rash, facial/tongue/throat swelling, SOB or lightheadedness with hypotension: Yes Has patient had a PCN reaction causing severe rash involving mucus membranes or skin necrosis: No Has patient had a PCN reaction that required hospitalization: No Has patient had a PCN reaction occurring within the last 10 years: No If all of the above answers are "NO", then may proceed with Cephalosporin use.   . Gabapentin Rash    Consultations:  Cardiology  Procedures/Studies: DG Chest 2 View  Result Date: 01/10/2020 CLINICAL DATA:  Left upper abdominal pain, chest pain EXAM: CHEST - 2 VIEW COMPARISON:  12/29/2017 FINDINGS: Heart and mediastinal contours are within normal limits. No focal opacities or effusions. No acute bony abnormality. IMPRESSION: Normal study. Electronically Signed   By: Rolm Baptise M.D.   On: 01/10/2020 02:24   CT ABDOMEN PELVIS W CONTRAST  Result Date: 01/10/2020 CLINICAL DATA:  Abdominal pain EXAM: CT ABDOMEN AND PELVIS WITH  CONTRAST TECHNIQUE: Multidetector CT imaging of the abdomen and pelvis was performed using the standard protocol following bolus administration of intravenous contrast. Oral contrast was also administered CONTRAST:  176mL OMNIPAQUE IOHEXOL 300 MG/ML  SOLN COMPARISON:  June 24, 2019 FINDINGS: Lower chest: Lung bases are clear. Hepatobiliary: Liver has a nodular contour with hypertrophy of the caudate lobe and scattered areas of atrophy elsewhere, consistent with hepatic cirrhosis, stable. No focal liver lesions are appreciable. Gallbladder is somewhat contracted. Gallbladder wall does not appear overtly thickened. There is no biliary duct dilatation. Pancreas: There is no appreciable pancreatic mass or inflammatory focus. There is no pancreatic duct dilatation or peripancreatic fluid. Pancreatic enhancement is within normal limits. Spleen: Spleen measures 13.1 x 18.6 x 10.4 cm with a measured splenic volume of 1,267 cubic cm. No focal splenic lesions are appreciable. Adrenals/Urinary Tract: Adrenals bilaterally appear unremarkable. Kidneys bilaterally show no evident mass or hydronephrosis on either side. No evident renal or ureteral calculus. Urinary bladder wall thickness is within normal limits. Stomach/Bowel: There is no appreciable bowel wall or mesenteric thickening. No evident bowel obstruction. Terminal ileum appears normal. No free air or portal venous air. Vascular/Lymphatic: No abdominal aortic aneurysm. There is aortic atherosclerosis. There  are extensive esophageal varices. There are multiple upper abdominal varices, particularly in the perisplenic region but seen throughout the upper abdomen. Umbilical vein is recanalized with umbilical region venous prominence consistent with the caput medusae appearance. There are mildly prominent vessels in the perirectal region, likely also variceal in etiology. All venous structures appear patent on this study. No adenopathy is evident in the abdomen or pelvis.  Reproductive: Uterus is anteverted.  No pelvic masses are evident. Other: There is no periappendiceal region inflammation. There is no evident abscess or ascites in the abdomen or pelvis. Musculoskeletal: No blastic or lytic bone lesions. No intramuscular lesions are evident. IMPRESSION: 1.  Hepatic cirrhosis without focal liver lesions evident. 2. Splenomegaly with splenic volume measured 1,267 cubic cm. No focal splenic lesions evident. 3. Extensive varices at multiple sites as summarized. All venous structures appear patent. 4. No bowel obstruction. No abscess in the abdomen or pelvis. No periappendiceal region inflammation. 5.  Normal appearing pancreas. 6.  Aortic Atherosclerosis (ICD10-I70.0). Electronically Signed   By: Lowella Grip III M.D.   On: 01/10/2020 12:08    Subjective: Patient was feeling better when seen during morning rounds.  She continued to experience left upper quadrant pain.  Pain increased with body movements and deep breathing.  We discussed obtaining a CT abdomen before going home due to elevated lipase.  She denies any nausea, vomiting or diarrhea.  No alcohol use history.  Discharge Exam: Vitals:   01/10/20 0745 01/10/20 0800  BP:    Pulse: 74 74  Resp: 18 (!) 23  Temp:    SpO2: 95% 92%   Vitals:   01/10/20 0400 01/10/20 0600 01/10/20 0745 01/10/20 0800  BP: (!) 127/99     Pulse: 85 92 74 74  Resp: 18  18 (!) 23  Temp:      TempSrc:      SpO2: 98% 100% 95% 92%    General: Pt is alert, awake, not in acute distress Cardiovascular: RRR, S1/S2 +, no rubs, no gallops Respiratory: CTA bilaterally, no wheezing, no rhonchi Abdominal: Soft, mild left upper quadrant tenderness ,ND, bowel sounds + Extremities: no edema, no cyanosis   The results of significant diagnostics from this hospitalization (including imaging, microbiology, ancillary and laboratory) are listed below for reference.    Microbiology: Recent Results (from the past 240 hour(s))  SARS  CORONAVIRUS 2 (TAT 6-24 HRS) Nasopharyngeal Nasopharyngeal Swab     Status: None   Collection Time: 01/01/20 12:17 PM   Specimen: Nasopharyngeal Swab  Result Value Ref Range Status   SARS Coronavirus 2 NEGATIVE NEGATIVE Final    Comment: (NOTE) SARS-CoV-2 target nucleic acids are NOT DETECTED. The SARS-CoV-2 RNA is generally detectable in upper and lower respiratory specimens during the acute phase of infection. Negative results do not preclude SARS-CoV-2 infection, do not rule out co-infections with other pathogens, and should not be used as the sole basis for treatment or other patient management decisions. Negative results must be combined with clinical observations, patient history, and epidemiological information. The expected result is Negative. Fact Sheet for Patients: SugarRoll.be Fact Sheet for Healthcare Providers: https://www.woods-mathews.com/ This test is not yet approved or cleared by the [place] FDA and  has been authorized for detection and/or diagnosis of SARS-CoV-2 by FDA under an Emergency Use Authorization (EUA). This EUA will remain  in effect (meaning this test can be used) for the duration of the COVID-19 declaration under Section 56 4(b)(1) of the Act, 21 U.S.C. section 360bbb-3(b)(1), unless the authorization  is terminated or revoked sooner. Performed at [facility], [place] [address] [place],  [address]      Labs: BNP (last 3 results) No results for input(s): BNP in the last 8760 hours. Basic Metabolic Panel: Recent Labs  Lab 01/10/20 0159  NA 140  K 3.6  CL 109  CO2 27  GLUCOSE 111*  BUN 8  CREATININE 0.48  CALCIUM 8.8*   Liver Function Tests: Recent Labs  Lab 01/10/20 0159  AST 39  ALT 19  ALKPHOS 91  BILITOT 0.9  PROT 6.6  ALBUMIN 3.3*   Recent Labs  Lab 01/10/20 0159  LIPASE 69*   No results for input(s): AMMONIA in the last 168 hours. CBC: Recent Labs  Lab  01/10/20 0159  WBC 2.4*  HGB 11.1*  HCT 34.2*  MCV 87.2  PLT 45*   Cardiac Enzymes: No results for input(s): CKTOTAL, CKMB, CKMBINDEX, TROPONINI in the last 168 hours. BNP: Invalid input(s): POCBNP CBG: No results for input(s): GLUCAP in the last 168 hours. D-Dimer No results for input(s): DDIMER in the last 72 hours. Hgb A1c No results for input(s): HGBA1C in the last 72 hours. Lipid Profile No results for input(s): CHOL, HDL, LDLCALC, TRIG, CHOLHDL, LDLDIRECT in the last 72 hours. Thyroid function studies No results for input(s): TSH, T4TOTAL, T3FREE, THYROIDAB in the last 72 hours.  Invalid input(s): FREET3 Anemia work up No results for input(s): VITAMINB12, FOLATE, FERRITIN, TIBC, IRON, RETICCTPCT in the last 72 hours. Urinalysis    Component Value Date/Time   COLORURINE YELLOW (A) 01/10/2020 0159   APPEARANCEUR CLEAR (A) 01/10/2020 0159   APPEARANCEUR Cloudy (A) 08/30/2019 1336   LABSPEC 1.019 01/10/2020 0159   LABSPEC 1.023 05/30/2013 1242   PHURINE 6.0 01/10/2020 0159   GLUCOSEU NEGATIVE 01/10/2020 0159   GLUCOSEU Negative 05/30/2013 1242   HGBUR NEGATIVE 01/10/2020 0159   BILIRUBINUR NEGATIVE 01/10/2020 0159   BILIRUBINUR Negative 08/30/2019 1336   BILIRUBINUR Negative 05/30/2013 1242   KETONESUR NEGATIVE 01/10/2020 0159   PROTEINUR NEGATIVE 01/10/2020 0159   NITRITE NEGATIVE 01/10/2020 0159   LEUKOCYTESUR NEGATIVE 01/10/2020 0159   LEUKOCYTESUR 1+ 05/30/2013 1242   Sepsis Labs Invalid input(s): PROCALCITONIN,  WBC,  LACTICIDVEN Microbiology Recent Results (from the past 240 hour(s))  SARS CORONAVIRUS 2 (TAT 6-24 HRS) Nasopharyngeal Nasopharyngeal Swab     Status: None   Collection Time: 01/01/20 12:17 PM   Specimen: Nasopharyngeal Swab  Result Value Ref Range Status   SARS Coronavirus 2 NEGATIVE NEGATIVE Final    Comment: (NOTE) SARS-CoV-2 target nucleic acids are NOT DETECTED. The SARS-CoV-2 RNA is generally detectable in upper and  lower respiratory specimens during the acute phase of infection. Negative results do not preclude SARS-CoV-2 infection, do not rule out co-infections with other pathogens, and should not be used as the sole basis for treatment or other patient management decisions. Negative results must be combined with clinical observations, patient history, and epidemiological information. The expected result is Negative. Fact Sheet for Patients: SugarRoll.be Fact Sheet for Healthcare Providers: https://www.woods-mathews.com/ This test is not yet approved or cleared by the [place] FDA and  has been authorized for detection and/or diagnosis of SARS-CoV-2 by FDA under an Emergency Use Authorization (EUA). This EUA will remain  in effect (meaning this test can be used) for the duration of the COVID-19 declaration under Section 56 4(b)(1) of the Act, 21 U.S.C. section 360bbb-3(b)(1), unless the authorization is terminated or revoked sooner. Performed at [facility], [place]  [address], [place], [place] [address]     Time coordinating discharge: Over 30 minutes  SIGNED:  Lorella Nimrod, MD  [facility] 01/10/2020, 12:52 PM Pager [phone]  If 7PM-7AM, please contact night-coverage [email or web address] Password [email or web address]  This record has been created using Systems analyst. Errors have been sought and corrected,but may not always be located. Such creation errors do not reflect on the standard of care.

## 2020-01-10 NOTE — ED Notes (Signed)
Pt given breakfast tray

## 2020-01-10 NOTE — ED Triage Notes (Signed)
Pt c/o left upper abdominal pain, under left breast. Pain radiates into back. Pt reports symptoms started on Saturday and tonight pain increased.

## 2020-01-10 NOTE — Consult Note (Signed)
Brandi Bright is a 49 y.o. female  [id number]  Primary Cardiologist: Neoma Laming Reason for Consultation: Chest pain  HPI: This is a 49 year old white female with a past medical history of myocardial infarction x2 and coronary artery disease presented to the hospital with atypical chest pain.  Patient states she had chest discomfort on the left side in the lower rib cage and is associated with deep inspiration and after she fell.  She had mildly elevated troponin thus I was asked to evaluate the patient.  Patient currently is feeling fine.   Review of Systems: No orthopnea PND or leg swelling   Past Medical History:  Diagnosis Date  . Arthritis   . Back pain   . Gout   . Hypothyroidism   . MI, old   . Thyroid disease     (Not in a hospital admission)    . [START ON 01/11/2020] aspirin EC  81 mg Oral Daily    Infusions:   Allergies  Allergen Reactions  . Vicodin [Hydrocodone-Acetaminophen] Rash  . Amoxicillin Rash and Other (See Comments)    Has patient had a PCN reaction causing immediate rash, facial/tongue/throat swelling, SOB or lightheadedness with hypotension: Yes Has patient had a PCN reaction causing severe rash involving mucus membranes or skin necrosis: No Has patient had a PCN reaction that required hospitalization: No Has patient had a PCN reaction occurring within the last 10 years: No If all of the above answers are "NO", then may proceed with Cephalosporin use.   . Gabapentin Rash    Social History   Socioeconomic History  . Marital status: Single    Spouse name: Not on file  . Number of children: Not on file  . Years of education: Not on file  . Highest education level: Not on file  Occupational History  . Not on file  Tobacco Use  . Smoking status: Never Smoker  . Smokeless tobacco: Never Used  Substance and Sexual Activity  . Alcohol use: Yes    Comment: rarely  . Drug use: Never  . Sexual activity: Not Currently  Other Topics  Concern  . Not on file  Social History Narrative  . Not on file   Social Determinants of Health   Financial Resource Strain: Medium Risk  . Difficulty of Paying Living Expenses: Somewhat hard  Food Insecurity: No Food Insecurity  . Worried About Charity fundraiser in the Last Year: Never true  . Ran Out of Food in the Last Year: Never true  Transportation Needs: No Transportation Needs  . Lack of Transportation (Medical): No  . Lack of Transportation (Non-Medical): No  Physical Activity: Inactive  . Days of Exercise per Week: 0 days  . Minutes of Exercise per Session: 0 min  Stress: Stress Concern Present  . Feeling of Stress : Rather much  Social Connections: Unknown  . Frequency of Communication with Friends and Family: More than three times a week  . Frequency of Social Gatherings with Friends and Family: More than three times a week  . Attends Religious Services: Never  . Active Member of Clubs or Organizations: No  . Attends Archivist Meetings: Not asked  . Marital Status: Not on file  Intimate Partner Violence: Not At Risk  . Fear of Current or Ex-Partner: No  . Emotionally Abused: No  . Physically Abused: No  . Sexually Abused: No    Family History  Problem Relation Age of Onset  . Hypertension Other   .  CAD Father   . Colon cancer Father   . Lung cancer Father   . Pancreatic cancer Brother     PHYSICAL EXAM: Vitals:   01/10/20 0745 01/10/20 0800  BP:    Pulse: 74 74  Resp: 18 (!) 23  Temp:    SpO2: 95% 92%    No intake or output data in the 24 hours ending 01/10/20 0845  General:  Well appearing. No respiratory difficulty HEENT: normal Neck: supple. no JVD. Carotids 2+ bilat; no bruits. No lymphadenopathy or thryomegaly appreciated. Cor: PMI nondisplaced. Regular rate & rhythm. No rubs, gallops or murmurs. Lungs: clear Abdomen: soft, nontender, nondistended. No hepatosplenomegaly. No bruits or masses. Good bowel sounds. Extremities: no  cyanosis, clubbing, rash, edema Neuro: alert & oriented x 3, cranial nerves grossly intact. moves all 4 extremities w/o difficulty. Affect pleasant.  ECG: Normal sinus rhythm no acute changes  Results for orders placed or performed during the hospital encounter of 01/10/20 (from the past 24 hour(s))  Lipase, blood     Status: Abnormal   Collection Time: 01/10/20  1:59 AM  Result Value Ref Range   Lipase 69 (H) 11 - 51 U/L  Comprehensive metabolic panel     Status: Abnormal   Collection Time: 01/10/20  1:59 AM  Result Value Ref Range   Sodium 140 135 - 145 mmol/L   Potassium 3.6 3.5 - 5.1 mmol/L   Chloride 109 98 - 111 mmol/L   CO2 27 22 - 32 mmol/L   Glucose, Bld 111 (H) 70 - 99 mg/dL   BUN 8 6 - 20 mg/dL   Creatinine, Ser 0.48 0.44 - 1.00 mg/dL   Calcium 8.8 (L) 8.9 - 10.3 mg/dL   Total Protein 6.6 6.5 - 8.1 g/dL   Albumin 3.3 (L) 3.5 - 5.0 g/dL   AST 39 15 - 41 U/L   ALT 19 0 - 44 U/L   Alkaline Phosphatase 91 38 - 126 U/L   Total Bilirubin 0.9 0.3 - 1.2 mg/dL   GFR calc non Af Amer >60 >60 mL/min   GFR calc Af Amer >60 >60 mL/min   Anion gap 4 (L) 5 - 15  CBC     Status: Abnormal   Collection Time: 01/10/20  1:59 AM  Result Value Ref Range   WBC 2.4 (L) 4.0 - 10.5 K/uL   RBC 3.92 3.87 - 5.11 MIL/uL   Hemoglobin 11.1 (L) 12.0 - 15.0 g/dL   HCT 34.2 (L) 36.0 - 46.0 %   MCV 87.2 80.0 - 100.0 fL   MCH 28.3 26.0 - 34.0 pg   MCHC 32.5 30.0 - 36.0 g/dL   RDW 12.8 11.5 - 15.5 %   Platelets 45 (L) 150 - 400 K/uL   nRBC 0.0 0.0 - 0.2 %  Urinalysis, Complete w Microscopic     Status: Abnormal   Collection Time: 01/10/20  1:59 AM  Result Value Ref Range   Color, Urine YELLOW (A) YELLOW   APPearance CLEAR (A) CLEAR   Specific Gravity, Urine 1.019 1.005 - 1.030   pH 6.0 5.0 - 8.0   Glucose, UA NEGATIVE NEGATIVE mg/dL   Hgb urine dipstick NEGATIVE NEGATIVE   Bilirubin Urine NEGATIVE NEGATIVE   Ketones, ur NEGATIVE NEGATIVE mg/dL   Protein, ur NEGATIVE NEGATIVE mg/dL    Nitrite NEGATIVE NEGATIVE   Leukocytes,Ua NEGATIVE NEGATIVE   WBC, UA 0-5 0 - 5 WBC/hpf   Bacteria, UA NONE SEEN NONE SEEN   Squamous Epithelial / LPF 0-5  0 - 5   Mucus PRESENT   Troponin I (High Sensitivity)     Status: Abnormal   Collection Time: 01/10/20  1:59 AM  Result Value Ref Range   Troponin I (High Sensitivity) 99 (H) <18 ng/L  Troponin I (High Sensitivity)     Status: Abnormal   Collection Time: 01/10/20  3:48 AM  Result Value Ref Range   Troponin I (High Sensitivity) 105 (HH) <18 ng/L   DG Chest 2 View  Result Date: 01/10/2020 CLINICAL DATA:  Left upper abdominal pain, chest pain EXAM: CHEST - 2 VIEW COMPARISON:  12/29/2017 FINDINGS: Heart and mediastinal contours are within normal limits. No focal opacities or effusions. No acute bony abnormality. IMPRESSION: Normal study. Electronically Signed   By: Rolm Baptise M.D.   On: 01/10/2020 02:24     ASSESSMENT AND PLAN: Atypical chest pain with borderline elevated troponin most likely due to demand ischemia with history of coronary artery disease in the past.  Her chest pain is more in the upper abdomen than actually retrosternal area.  She apparently fell and may be related to that.  However since troponin was borderline elevated which is probably demand ischemia advise further evaluation by nuclear stress test in my office or CTA coronaries in my office.  Will see the patient tomorrow at 9 AM and will set up further work-up.  Thank you very much for referral.  Patient can be discharged.  Jasmine Mcbeth A

## 2020-01-10 NOTE — Progress Notes (Signed)
Per cardio MD patient ok to d/c and f/u tomorrow as out patient. Attending MD aware, plans to obtain CT then d/c patient depending upon findings. Patient made aware of plan of care.

## 2020-01-10 NOTE — ED Provider Notes (Signed)
[facility] Emergency Department Provider Note   ____________________________________________   First MD Initiated Contact with Patient 01/10/20 [phone]     (approximate)  I have reviewed the triage vital signs and the nursing notes.   HISTORY  Chief Complaint Chest Pain    HPI Brandi Bright is a 49 y.o. female who presents to the ED from home with a chief complaint of left chest pain.  Patient reports Saturday night she was leaning over a chair with arms over her left side with her "booby hanging over the arm" and experienced sharp pain under her left breast.  Pain has been waxing/waning since that time, exacerbated by movement.  Patient used her arm to lift herself out of a chair this evening which exacerbated the pain.  Now pain radiates into her back and associated with nausea and shortness of breath.  Denies fever, cough, abdominal pain, vomiting, rectal bleeding.  Denies COVID-19 exposure.  Patient has a history of esophageal varices and recently underwent EGD.  Also has a history of thrombocytopenia, leukopenia and prior MI.    EGD 1/20: Normal duodenal bulb and second portion of the                         duodenum.                        - Multiple gastric polyps.                        - Portal hypertensive gastropathy.                        - Small (< 5 mm) esophageal varices with no bleeding                         and no stigmata of recent bleeding.     Past Medical History:  Diagnosis Date  . Arthritis   . Back pain   . Gout   . Hypothyroidism   . MI, old   . Thyroid disease     Patient Active Problem List   Diagnosis Date Noted  . Elevated troponin 01/10/2020  . Portal hypertension ([place])   . GI bleeding 10/11/2019  . Dysphagia   . Gastritis without bleeding   . GI bleed 06/24/2019  . Iron deficiency anemia 10/31/2018  . Low vitamin B12 level 10/31/2018  . Low serum copper for age 75/18/2019  . Positive ANA (antinuclear  antibody) 10/31/2018  . Other cirrhosis of liver ([place])   . Esophageal varices without bleeding ([place])   . Stomach irritation   . Gastric polyp   . Normocytic anemia 09/23/2018  . Thrombocytopenia ([place]) 09/23/2018  . Leukopenia 09/23/2018  . Splenomegaly 09/23/2018  . Colon cancer screening   . Benign neoplasm of descending colon   . Benign neoplasm of ascending colon   . Family history of malignant neoplasm of gastrointestinal tract   . Benign neoplasm of transverse colon   . Polyp of sigmoid colon   . Chest pain 12/29/2017  . Anxiety and depression 04/30/2014  . Chronic pain with drug dependence ([place]) 04/30/2014  . Coronary artery disease 04/30/2014  . Degenerative disc disease 04/30/2014  . Hyperlipidemia 04/30/2014  . Hypertension 04/30/2014  . Hypothyroid 04/30/2014    Past Surgical History:  Procedure Laterality Date  . CARDIAC CATHETERIZATION    .  CESAREAN SECTION    . COLONOSCOPY WITH PROPOFOL N/A 03/16/2018   Procedure: COLONOSCOPY WITH PROPOFOL;  Surgeon: Virgel Manifold, MD;  Location: [facility] ENDOSCOPY;  Service: Endoscopy;  Laterality: N/A;  . ESOPHAGOGASTRODUODENOSCOPY N/A 10/11/2019   Procedure: ESOPHAGOGASTRODUODENOSCOPY (EGD);  Surgeon: Lin Landsman, MD;  Location: [facility] ENDOSCOPY;  Service: Gastroenterology;  Laterality: N/A;  . ESOPHAGOGASTRODUODENOSCOPY (EGD) WITH PROPOFOL N/A 10/27/2018   Procedure: ESOPHAGOGASTRODUODENOSCOPY (EGD) WITH PROPOFOL;  Surgeon: Virgel Manifold, MD;  Location: [facility] ENDOSCOPY;  Service: Endoscopy;  Laterality: N/A;  . ESOPHAGOGASTRODUODENOSCOPY (EGD) WITH PROPOFOL N/A 06/25/2019   Procedure: ESOPHAGOGASTRODUODENOSCOPY (EGD) WITH PROPOFOL;  Surgeon: Jonathon Bellows, MD;  Location[facility] ENDOSCOPY;  Service: Gastroenterology;  Laterality: N/A;  . ESOPHAGOGASTRODUODENOSCOPY (EGD) WITH PROPOFOL N/A 09/12/2019   Procedure: ESOPHAGOGASTRODUODENOSCOPY (EGD) WITH PROPOFOL;  Surgeon: Lucilla Lame, MD;  Location: [facility] ENDOSCOPY;  Service:  Endoscopy;  Laterality: N/A;  . ESOPHAGOGASTRODUODENOSCOPY (EGD) WITH PROPOFOL N/A 11/20/2019   Procedure: ESOPHAGOGASTRODUODENOSCOPY (EGD) WITH PROPOFOL;  Surgeon: Virgel Manifold, MD;  Location: [facility] ENDOSCOPY;  Service: Endoscopy;  Laterality: N/A;  . ESOPHAGOGASTRODUODENOSCOPY (EGD) WITH PROPOFOL N/A 11/23/2019   Procedure: ESOPHAGOGASTRODUODENOSCOPY (EGD) WITH PROPOFOL;  Surgeon: Virgel Manifold, MD;  Location: [facility] ENDOSCOPY;  Service: Endoscopy;  Laterality: N/A;  . ESOPHAGOGASTRODUODENOSCOPY (EGD) WITH PROPOFOL N/A 01/03/2020   Procedure: ESOPHAGOGASTRODUODENOSCOPY (EGD) WITH PROPOFOL;  Surgeon: Lin Landsman, MD;  Location: [facility] ENDOSCOPY;  Service: Endoscopy;  Laterality: N/A;  . TUBAL LIGATION      Prior to Admission medications   Medication Sig Start Date End Date Taking? Authorizing Provider  [employer] VITAMIN B12 1000 MCG tablet TAKE 1 TABLET BY MOUTH EVERY DAY 08/13/19   Earlie Server, MD  furosemide (LASIX) 20 MG tablet Take 1 tablet (20 mg total) by mouth daily. 10/12/19 10/11/20  Bettey Costa, MD  Iron-Vitamin C (VITRON-C) 65-125 MG TABS Take 1 tablet by mouth daily. 08/15/19   Earlie Server, MD  lactulose (CHRONULAC) 10 GM/15ML solution TAKE 15 MLS (10 G TOTAL) BY MOUTH DAILY FOR 30 DAYS. 08/24/19   Virgel Manifold, MD  levothyroxine (SYNTHROID, LEVOTHROID) 200 MCG tablet Take 200 mcg by mouth daily. 11/26/17   [provider]  nitroGLYCERIN (NITROLINGUAL) 0.4 MG/SPRAY spray Place 1 spray under the tongue every 5 (five) minutes x 3 doses as needed for chest pain.    [provider]  pantoprazole (PROTONIX) 40 MG tablet Take 1 tablet (40 mg total) by mouth 2 (two) times daily. 10/17/19   Virgel Manifold, MD  Sofosbuvir-Velpatasvir (EPCLUSA) 400-100 MG TABS Take 1 tablet by mouth daily. 08/30/19   Virgel Manifold, MD  spironolactone (ALDACTONE) 50 MG tablet Take 50 mg by mouth daily. 08/21/18   [provider]  venlafaxine XR (EFFEXOR-XR) 75 MG 24  hr capsule Take 75 mg by mouth daily.  08/22/18   [provider]    Allergies Vicodin [hydrocodone-acetaminophen], Amoxicillin, and Gabapentin  Family History  Problem Relation Age of Onset  . Hypertension Other   . CAD Father   . Colon cancer Father   . Lung cancer Father   . Pancreatic cancer Brother     Social History Social History   Tobacco Use  . Smoking status: Never Smoker  . Smokeless tobacco: Never Used  Substance Use Topics  . Alcohol use: Yes    Comment: rarely  . Drug use: Never    Review of Systems  Constitutional: No fever/chills Eyes: No visual changes. ENT: No sore throat. Cardiovascular: Positive for chest pain. Respiratory: Denies  shortness of breath. Gastrointestinal: No abdominal pain.  No nausea, no vomiting.  No diarrhea.  No constipation. Genitourinary: Negative for dysuria. Musculoskeletal: Negative for back pain. Skin: Negative for rash. Neurological: Negative for headaches, focal weakness or numbness.   ____________________________________________   PHYSICAL EXAM:  VITAL SIGNS: ED Triage Vitals [01/10/20 0157]  Enc Vitals Group     BP (!) 158/99     Pulse Rate (!) 104     Resp 18     Temp 98.4 F (36.9 C)     Temp Source Oral     SpO2 98 %     Weight      Height      Head Circumference      Peak Flow      Pain Score 10     Pain Loc      Pain Edu?      Excl. in [place]?     Constitutional: Alert and oriented. Well appearing and in mild acute distress. Eyes: Conjunctivae are normal. PERRL. EOMI. Head: Atraumatic. Nose: No congestion/rhinnorhea. Mouth/Throat: Mucous membranes are moist.  Oropharynx non-erythematous. Neck: No stridor.   Cardiovascular: Normal rate, regular rhythm. Grossly normal heart sounds.  Good peripheral circulation. Respiratory: Normal respiratory effort.  No retractions. Lungs CTAB.  Point tender to palpation left lateral ribs and beneath breasts.  Mild splinting.  No  crepitus. Gastrointestinal: Soft and nontender to light or deep palpation, especially in left upper quadrant. No distention. No abdominal bruits. No CVA tenderness. Musculoskeletal: No lower extremity tenderness nor edema.  No joint effusions. Neurologic:  Normal speech and language. No gross focal neurologic deficits are appreciated. No gait instability. Skin:  Skin is warm, dry and intact. No rash noted.  No vesicles noted. Psychiatric: Mood and affect are normal. Speech and behavior are normal.  ____________________________________________   LABS (all labs ordered are listed, but only abnormal results are displayed)  Labs Reviewed  LIPASE, BLOOD - Abnormal; Notable for the following components:      Result Value   Lipase 69 (*)    All other components within normal limits  COMPREHENSIVE METABOLIC PANEL - Abnormal; Notable for the following components:   Glucose, Bld 111 (*)    Calcium 8.8 (*)    Albumin 3.3 (*)    Anion gap 4 (*)    All other components within normal limits  CBC - Abnormal; Notable for the following components:   WBC 2.4 (*)    Hemoglobin 11.1 (*)    HCT 34.2 (*)    Platelets 45 (*)    All other components within normal limits  URINALYSIS, COMPLETE (UACMP) WITH MICROSCOPIC - Abnormal; Notable for the following components:   Color, Urine YELLOW (*)    APPearance CLEAR (*)    All other components within normal limits  TROPONIN I (HIGH SENSITIVITY) - Abnormal; Notable for the following components:   Troponin I (High Sensitivity) 99 (*)    All other components within normal limits  TROPONIN I (HIGH SENSITIVITY) - Abnormal; Notable for the following components:   Troponin I (High Sensitivity) 105 (*)    All other components within normal limits  SARS CORONAVIRUS 2 (TAT 6-24 HRS)   ____________________________________________  EKG  ED ECG REPORT I, Martina Brodbeck J, the attending physician, personally viewed and interpreted this ECG.   Date: 01/10/2020  EKG  Time: 0158  Rate: 83  Rhythm: normal EKG, normal sinus rhythm  Axis: Normal  Intervals:none  ST&T Change: Nonspecific  ____________________________________________  RADIOLOGY  ED  MD interpretation: No acute cardiopulmonary process  Official radiology report(s): DG Chest 2 View  Result Date: 01/10/2020 CLINICAL DATA:  Left upper abdominal pain, chest pain EXAM: CHEST - 2 VIEW COMPARISON:  12/29/2017 FINDINGS: Heart and mediastinal contours are within normal limits. No focal opacities or effusions. No acute bony abnormality. IMPRESSION: Normal study. Electronically Signed   By: Rolm Baptise M.D.   On: 01/10/2020 02:24    ____________________________________________   PROCEDURES  Procedure(s) performed (including Critical Care):  Procedures   ____________________________________________   INITIAL IMPRESSION / ASSESSMENT AND PLAN / ED COURSE  As part of my medical decision making, I reviewed the following data within the [facility] notes reviewed and incorporated, Labs reviewed, EKG interpreted, Old chart reviewed, Radiograph reviewed, Notes from prior ED visits and [place] Controlled Substance Database     Brandi Bright was evaluated in Emergency Department on 01/10/2020 for the symptoms described in the history of present illness. She was evaluated in the context of the global COVID-19 pandemic, which necessitated consideration that the patient might be at risk for infection with the SARS-CoV-2 virus that causes COVID-19. Institutional protocols and algorithms that pertain to the evaluation of patients at risk for COVID-19 are in a state of rapid change based on information released by regulatory bodies including the CDC and federal and state organizations. These policies and algorithms were followed during the patient's care in the ED.    49 year old female who presents with reproducible left chest wall pain after pressing down on the arm of a chair.  Differential diagnosis includes, but is not limited to, ACS, aortic dissection, pulmonary embolism, cardiac tamponade, pneumothorax, pneumonia, pericarditis, myocarditis, GI-related causes including esophagitis/gastritis, and musculoskeletal chest wall pain.    Laboratory results reveal stable leukopenia and thrombocytopenia.  Chest x-ray unremarkable for acute fracture or pneumothorax.  Will administer oxycodone for pain and reassess.  Awaiting cardiac labs which were done due to patient's prior history of MI.   Clinical Course as of Jan 09 441  Wed Jan 10, 2020  0310 Updated patient on elevated troponin.  Hesitant to start heparin at this time given patient's history of thrombocytopenia.  Discussed with hospitalist services for admission.   [JS]    Clinical Course User Index [JS] Paulette Blanch, MD     ____________________________________________   FINAL CLINICAL IMPRESSION(S) / ED DIAGNOSES  Final diagnoses:  Chest wall pain  Nonspecific chest pain  Elevated troponin     ED Discharge Orders    None       Note:  This document was prepared using Dragon voice recognition software and may include unintentional dictation errors.   Paulette Blanch, MD 01/10/20 [phone]

## 2020-01-10 NOTE — ED Notes (Signed)
IV removed. Discharge instructions given to patient. Verbalized understanding. No acute distress at this time. Family to transport patient home.

## 2020-01-15 DIAGNOSIS — E039 Hypothyroidism, unspecified: Secondary | ICD-10-CM | POA: Diagnosis not present

## 2020-01-15 DIAGNOSIS — R0602 Shortness of breath: Secondary | ICD-10-CM | POA: Diagnosis not present

## 2020-01-15 DIAGNOSIS — E669 Obesity, unspecified: Secondary | ICD-10-CM | POA: Diagnosis not present

## 2020-01-15 DIAGNOSIS — R079 Chest pain, unspecified: Secondary | ICD-10-CM | POA: Diagnosis not present

## 2020-01-15 DIAGNOSIS — I10 Essential (primary) hypertension: Secondary | ICD-10-CM | POA: Diagnosis not present

## 2020-01-16 ENCOUNTER — Encounter: Payer: Self-pay | Admitting: Oncology

## 2020-01-16 ENCOUNTER — Telehealth: Payer: Self-pay

## 2020-01-16 ENCOUNTER — Other Ambulatory Visit: Payer: Self-pay | Admitting: Oncology

## 2020-01-16 DIAGNOSIS — D500 Iron deficiency anemia secondary to blood loss (chronic): Secondary | ICD-10-CM | POA: Insufficient documentation

## 2020-01-16 DIAGNOSIS — D509 Iron deficiency anemia, unspecified: Secondary | ICD-10-CM

## 2020-01-16 HISTORY — DX: Iron deficiency anemia, unspecified: D50.9

## 2020-01-16 HISTORY — DX: Iron deficiency anemia secondary to blood loss (chronic): D50.0

## 2020-01-16 NOTE — Telephone Encounter (Signed)
-----   Message from Earlie Server, MD sent at 01/15/2020  9:30 PM EST ----- Sure. I will move her follow up appt with for further discuss. IV iron was offered and she preferred to try oral first.  Thank you  Nira Conn, please move her appt to this week,  in person MD +/- venofer. No need for labs.  Thank you.   [place] ----- Message ----- From: Lin Landsman, MD Sent: 01/03/2020   4:43 PM EST To: Earlie Server, MD  Dr Tasia Catchings  I rechecked her labs today, iron very low.  Could you please set her up for iron infusion?  I know she wanted to try oral iron which she is currently on.  Thanks Rohini

## 2020-01-16 NOTE — Telephone Encounter (Signed)
Will need orders entered.

## 2020-01-16 NOTE — Telephone Encounter (Signed)
Done..   Per pt request to be seen on 01/19/20  Sched for MD/+/- Venofer.

## 2020-01-18 ENCOUNTER — Other Ambulatory Visit: Payer: Self-pay

## 2020-01-18 ENCOUNTER — Encounter: Payer: Self-pay | Admitting: Oncology

## 2020-01-18 NOTE — Progress Notes (Signed)
Patient pre screened for office appointment, no questions or concerns today. Patient reminded of upcoming appointment time and date. 

## 2020-01-19 ENCOUNTER — Inpatient Hospital Stay: Payer: Medicaid Other | Attending: Oncology | Admitting: Oncology

## 2020-01-19 ENCOUNTER — Inpatient Hospital Stay: Payer: Medicaid Other

## 2020-01-19 ENCOUNTER — Other Ambulatory Visit: Payer: Self-pay

## 2020-01-19 VITALS — BP 144/86 | HR 83 | Temp 96.5°F | Resp 18 | Wt 200.1 lb

## 2020-01-19 VITALS — BP 117/78 | HR 68 | Resp 18

## 2020-01-19 DIAGNOSIS — D649 Anemia, unspecified: Secondary | ICD-10-CM | POA: Diagnosis not present

## 2020-01-19 DIAGNOSIS — D509 Iron deficiency anemia, unspecified: Secondary | ICD-10-CM

## 2020-01-19 DIAGNOSIS — D72819 Decreased white blood cell count, unspecified: Secondary | ICD-10-CM | POA: Diagnosis not present

## 2020-01-19 DIAGNOSIS — D696 Thrombocytopenia, unspecified: Secondary | ICD-10-CM | POA: Diagnosis not present

## 2020-01-19 DIAGNOSIS — D500 Iron deficiency anemia secondary to blood loss (chronic): Secondary | ICD-10-CM | POA: Diagnosis not present

## 2020-01-19 MED ORDER — SODIUM CHLORIDE 0.9 % IV SOLN: 200.0000 mg | Freq: Once | INTRAVENOUS | Status: DC

## 2020-01-19 MED ORDER — SODIUM CHLORIDE 0.9 % IV SOLN
Freq: Once | INTRAVENOUS | Status: AC
  Filled 2020-01-19: qty 250

## 2020-01-19 MED ORDER — IRON SUCROSE 20 MG/ML IV SOLN
200.0000 mg | Freq: Once | INTRAVENOUS | Status: AC
  Administered 2020-01-19: 200 mg via INTRAVENOUS
  Filled 2020-01-19: qty 10

## 2020-01-19 NOTE — Progress Notes (Signed)
Pt tolerated first time Venofer infusion well with no signs of complications or reaction. RN verbalized the importance of calling the clinic if any complications or signs of reaction occur at home and to call 911 if it is an emergency. Pt verbalized understanding and all questions answered at this time. VSS, pt stable for discharge.   Brandi Bright [employer]

## 2020-01-19 NOTE — Progress Notes (Signed)
[facility] day:  01/19/2020  Chief Complaint: Brandi Bright is a 49 y.o. female follows up for management of iron deficiency anemia, thrombocytopenia, and leukopenia   PERTINENT HEMATOLOGY HISTORY Patient follows up with Dr. Mike Gip previously.  Establish care with me on 02/09/2019. Reviewed patient's previous medical records, labs, imaging results. # History of cirrhosis and hepatitis C.  She has a history of chronic anemia, thrombocytopenia and leukopenia dating back to 03/2012.  Platelet count has fluctuated between 54,000 - 56,000 since 12/28/2017 (previously 73,000 - 134,000).  Ferritin was 14 on 08/31/2018.    Work-up on 09/23/2018 revealed a hematocrit of 31.0, hemoglobin 10.1, MCV 87.6, platelets 46,000, WBC 1900 with an ANC of 1100.  Ferritin was 10 (low) with iron saturation 16% with a TIBC of 401.  Retic was 1.2%.  B12 was 315.  Normal studies included: folate, SPEP, and free light chain ratio.  Copper was 69 (72-166).  ANA was + with double stranded DNA antibody 13 (0-9).  TSH was 0.036 (0.35-4.5) with a free T4 1.05 (0.82-1.77).  Peripheral smear revealed variant lymphocytes.  Ferritin has been followed:  14 on 08/31/2018 and 10 on 09/23/2018.  Abdomen and pelvic CT on 07/14/2018 revealed cirrhosis with portal hypertension noted by prominent splenomegaly (18.5 x 13.8 x 8.1 cm; volume 1100 cm3), enlarged portal veins with recannulated umbilical vein, paraesophageal and perigastric varices.  There was mild thickening of the cecum and ascending colon.  EGD on 10/27/2018 revealed grade II esophageal varices.  There was erythematous mucosa in the antrum.  There was congested, erythematous, friable (with contact bleeding), granular and nodular mucosa in the gastric fundus and astric body.  There was a single gastric polyp (polypoid ulcerated antral type mucos with chronic active mucosal inflammation).  There was a normal duodenal bulb, second  portion of the duodenum and examined duodenum.  The patient's iron deficiency could be explained by her friable gastric mucosa (likely from portal hypertension).  There was no history of variceal bleeding and thus this is not a cause of her iron deficiency.  # admitted from 10/11/2019 to 10/12/2019 due to rectal bleeding EGD 10/12/2019 showed portal hypertensive gastropathy, treated with APC.  Nonbleeding large esophageal varices incompletely evaluated.  Banded. Patient follows with gastroenterology and had EGD on 11/20/2019. Banding was not done due to large amount of food in the stomach. There was plan for EGD on 11/23/2019.  Her gastroenterologist Dr. Bonna Gains contacted me to see if patient can get platelet transfusion to improve her platelet counts for banding on 11/23/2019.   INTERVAL HISTORY Brandi Bright is a 49 y.o. female who has above history reviewed by me today presents for follow up visit for management of thrombocytopenia, splenomegaly, leukopenia and anemia. Problems and complaints are listed below: Patient was last seen by me on 11/22/2019.  IV iron was offered and patient prefers to take oral iron supplementation. Recent blood work on 01/10/2020 showed hemoglobin 11.1, ferritin 7. Patient presents for follow-up.  She has no new complaints.  Chronic fatigue.  No active bleeding.  Easy bruising. Patient reports that she may not be taking iron pills correctly.  Review of Systems  Constitutional: Positive for fatigue. Negative for appetite change, chills and fever.  HENT:   Negative for hearing loss and voice change.   Eyes: Negative for eye problems.  Respiratory: Negative for chest tightness and cough.   Cardiovascular: Negative for chest pain.  Gastrointestinal: Negative for abdominal distention, abdominal pain  and blood in stool.  Endocrine: Negative for hot flashes.  Genitourinary: Negative for difficulty urinating and frequency.   Musculoskeletal: Negative for arthralgias.   Skin: Negative for itching and rash.  Neurological: Negative for extremity weakness.  Hematological: Negative for adenopathy. Bruises/bleeds easily.  Psychiatric/Behavioral: Negative for confusion.   Past Medical History:  Diagnosis Date  . Arthritis   . Back pain   . Gout   . Hypothyroidism   . IDA (iron deficiency anemia) 01/16/2020  . MI, old   . Thyroid disease     Past Surgical History:  Procedure Laterality Date  . CARDIAC CATHETERIZATION    . CESAREAN SECTION    . COLONOSCOPY WITH PROPOFOL N/A 03/16/2018   Procedure: COLONOSCOPY WITH PROPOFOL;  Surgeon: Virgel Manifold, MD;  Location: [facility] ENDOSCOPY;  Service: Endoscopy;  Laterality: N/A;  . ESOPHAGOGASTRODUODENOSCOPY N/A 10/11/2019   Procedure: ESOPHAGOGASTRODUODENOSCOPY (EGD);  Surgeon: Lin Landsman, MD;  Location: [facility] ENDOSCOPY;  Service: Gastroenterology;  Laterality: N/A;  . ESOPHAGOGASTRODUODENOSCOPY (EGD) WITH PROPOFOL N/A 10/27/2018   Procedure: ESOPHAGOGASTRODUODENOSCOPY (EGD) WITH PROPOFOL;  Surgeon: Virgel Manifold, MD;  Location: [facility] ENDOSCOPY;  Service: Endoscopy;  Laterality: N/A;  . ESOPHAGOGASTRODUODENOSCOPY (EGD) WITH PROPOFOL N/A 06/25/2019   Procedure: ESOPHAGOGASTRODUODENOSCOPY (EGD) WITH PROPOFOL;  Surgeon: Jonathon Bellows, MD;  Location: [facility]COPY;  Service: Gastroenterology;  Laterality: N/A;  . ESOPHAGOGASTRODUODENOSCOPY (EGD) WITH PROPOFOL N/A 09/12/2019   Procedure: ESOPHAGOGASTRODUODENOSCOPY (EGD) WITH PROPOFOL;  Surgeon: Lucilla Lame, MD;  Location: [facility] ENDOSCOPY;  Service: Endoscopy;  Laterality: N/A;  . ESOPHAGOGASTRODUODENOSCOPY (EGD) WITH PROPOFOL N/A 11/20/2019   Procedure: ESOPHAGOGASTRODUODENOSCOPY (EGD) WITH PROPOFOL;  Surgeon: Virgel Manifold, MD;  Location: [facility] ENDOSCOPY;  Service: Endoscopy;  Laterality: N/A;  . ESOPHAGOGASTRODUODENOSCOPY (EGD) WITH PROPOFOL N/A 11/23/2019   Procedure: ESOPHAGOGASTRODUODENOSCOPY (EGD) WITH PROPOFOL;  Surgeon: Virgel Manifold,  MD;  Location: [facility] ENDOSCOPY;  Service: Endoscopy;  Laterality: N/A;  . ESOPHAGOGASTRODUODENOSCOPY (EGD) WITH PROPOFOL N/A 01/03/2020   Procedure: ESOPHAGOGASTRODUODENOSCOPY (EGD) WITH PROPOFOL;  Surgeon: Lin Landsman, MD;  Location: [facility] ENDOSCOPY;  Service: Endoscopy;  Laterality: N/A;  . TUBAL LIGATION      Family History  Problem Relation Age of Onset  . Hypertension Other   . CAD Father   . Colon cancer Father   . Lung cancer Father   . Pancreatic cancer Brother    Social History   Socioeconomic History  . Marital status: Single    Spouse name: Not on file  . Number of children: Not on file  . Years of education: Not on file  . Highest education level: Not on file  Occupational History  . Not on file  Tobacco Use  . Smoking status: Never Smoker  . Smokeless tobacco: Never Used  Substance and Sexual Activity  . Alcohol use: Yes    Comment: rarely  . Drug use: Never  . Sexual activity: Not Currently  Other Topics Concern  . Not on file  Social History Narrative  . Not on file   Social Determinants of Health   Financial Resource Strain: Medium Risk  . Difficulty of Paying Living Expenses: Somewhat hard  Food Insecurity: No Food Insecurity  . Worried About Charity fundraiser in the Last Year: Never true  . Ran Out of Food in the Last Year: Never true  Transportation Needs: No Transportation Needs  . Lack of Transportation (Medical): No  . Lack of Transportation (Non-Medical): No  Physical Activity: Inactive  . Days of Exercise per Week: 0 days  .  Minutes of Exercise per Session: 0 min  Stress: Stress Concern Present  . Feeling of Stress : Rather much  Social Connections: Unknown  . Frequency of Communication with Friends and Family: More than three times a week  . Frequency of Social Gatherings with Friends and Family: More than three times a week  . Attends Religious Services: Never  . Active Member of Clubs or Organizations: No  . Attends Theatre manager Meetings: Not asked  . Marital Status: Not on file  Intimate Partner Violence: Not At Risk  . Fear of Current or Ex-Partner: No  . Emotionally Abused: No  . Physically Abused: No  . Sexually Abused: No    Allergies:  Allergies  Allergen Reactions  . Vicodin [Hydrocodone-Acetaminophen] Rash  . Amoxicillin Rash and Other (See Comments)    Has patient had a PCN reaction causing immediate rash, facial/tongue/throat swelling, SOB or lightheadedness with hypotension: Yes Has patient had a PCN reaction causing severe rash involving mucus membranes or skin necrosis: No Has patient had a PCN reaction that required hospitalization: No Has patient had a PCN reaction occurring within the last 10 years: No If all of the above answers are "NO", then may proceed with Cephalosporin use.   . Gabapentin Rash    Current Medications: Current Outpatient Medications  Medication Sig Dispense Refill  . aspirin EC 81 MG EC tablet Take 1 tablet (81 mg total) by mouth daily. 90 tablet 0  . [employer] VITAMIN B12 1000 MCG tablet TAKE 1 TABLET BY MOUTH EVERY DAY 90 tablet 1  . diclofenac Sodium (VOLTAREN) 1 % GEL Apply 5 g topically 4 (four) times daily.    . Iron-Vitamin C (VITRON-C) 65-125 MG TABS Take 1 tablet by mouth daily. 30 tablet 3  . lactulose (CHRONULAC) 10 GM/15ML solution TAKE 15 MLS (10 G TOTAL) BY MOUTH DAILY FOR 30 DAYS. 473 mL 3  . levothyroxine (SYNTHROID, LEVOTHROID) 200 MCG tablet Take 200 mcg by mouth daily.  8  . nitroGLYCERIN (NITROLINGUAL) 0.4 MG/SPRAY spray Place 1 spray under the tongue every 5 (five) minutes x 3 doses as needed for chest pain.    Marland Kitchen oxyCODONE-acetaminophen (PERCOCET/ROXICET) 5-325 MG tablet Take 1 tablet by mouth 4 (four) times daily.     . pantoprazole (PROTONIX) 40 MG tablet Take 1 tablet (40 mg total) by mouth 2 (two) times daily. 60 tablet 0  . spironolactone (ALDACTONE) 50 MG tablet Take 50 mg by mouth daily.  5  . venlafaxine XR (EFFEXOR-XR) 75 MG 24 hr  capsule Take 75 mg by mouth daily.   4  . Sofosbuvir-Velpatasvir (EPCLUSA) 400-100 MG TABS Take 1 tablet by mouth daily. (Patient not taking: Reported on 01/19/2020) 28 tablet 2   No current facility-administered medications for this visit.     Physical Exam: Blood pressure (!) 144/86, pulse 83, temperature (!) 96.5 F (35.8 C), resp. rate 18, weight 200 lb 1.6 oz (90.8 kg). Physical Exam  Constitutional: She is oriented to person, place, and time. No distress.  HENT:  Head: Normocephalic and atraumatic.  Nose: Nose normal.  Mouth/Throat: Oropharynx is clear and moist. No oropharyngeal exudate.  Chronic right frontal mass.  Eyes: Pupils are equal, round, and reactive to light. EOM are normal. No scleral icterus.  Cardiovascular: Normal rate and regular rhythm.  No murmur heard. Pulmonary/Chest: Effort normal and breath sounds normal. No respiratory distress. She has no rales. She exhibits no tenderness.  Abdominal: Soft. Bowel sounds are normal. She exhibits no distension. There  is no abdominal tenderness.  Splenomegaly  Musculoskeletal:        General: No edema. Normal range of motion.     Cervical back: Normal range of motion and neck supple.  Neurological: She is alert and oriented to person, place, and time. No cranial nerve deficit. She exhibits normal muscle tone. Coordination normal.  Skin: Skin is warm and dry. She is not diaphoretic. No erythema.  Psychiatric: Affect normal.    No visits with results within 3 Day(s) from this visit.  Latest known visit with results is:  Admission on 01/10/2020, Discharged on 01/10/2020  Component Date Value Ref Range Status  . Lipase 01/10/2020 69* 11 - 51 U/L Final   Performed at [facility], [address] [place], [place] [address]  . Sodium 01/10/2020 140  135 - 145 mmol/L Final  . Potassium 01/10/2020 3.6  3.5 - 5.1 mmol/L Final  . Chloride 01/10/2020 109  98 - 111 mmol/L Final  . CO2 01/10/2020 27  22 - 32 mmol/L Final   . Glucose, Bld 01/10/2020 111* 70 - 99 mg/dL Final  . BUN 01/10/2020 8  6 - 20 mg/dL Final  . Creatinine, Ser 01/10/2020 0.48  0.44 - 1.00 mg/dL Final  . Calcium 01/10/2020 8.8* 8.9 - 10.3 mg/dL Final  . Total Protein 01/10/2020 6.6  6.5 - 8.1 g/dL Final  . Albumin 01/10/2020 3.3* 3.5 - 5.0 g/dL Final  . AST 01/10/2020 39  15 - 41 U/L Final  . ALT 01/10/2020 19  0 - 44 U/L Final  . Alkaline Phosphatase 01/10/2020 91  38 - 126 U/L Final  . Total Bilirubin 01/10/2020 0.9  0.3 - 1.2 mg/dL Final  . GFR calc non Af Amer 01/10/2020 >60  >60 mL/min Final  . GFR calc Af Amer 01/10/2020 >60  >60 mL/min Final  . Anion gap 01/10/2020 4* 5 - 15 Final   Performed at [facility], [address] [place], [place] [address]  . WBC 01/10/2020 2.4* 4.0 - 10.5 K/uL Final  . RBC 01/10/2020 3.92  3.87 - 5.11 MIL/uL Final  . Hemoglobin 01/10/2020 11.1* 12.0 - 15.0 g/dL Final  . HCT 01/10/2020 34.2* 36.0 - 46.0 % Final  . MCV 01/10/2020 87.2  80.0 - 100.0 fL Final  . MCH 01/10/2020 28.3  26.0 - 34.0 pg Final  . MCHC 01/10/2020 32.5  30.0 - 36.0 g/dL Final  . RDW 01/10/2020 12.8  11.5 - 15.5 % Final  . Platelets 01/10/2020 45* 150 - 400 K/uL Final   Comment: Immature Platelet Fraction may be clinically indicated, consider ordering this additional test [id number] CONSISTENT WITH PREVIOUS RESULT   . nRBC 01/10/2020 0.0  0.0 - 0.2 % Final   Performed at [facility] : [facility], [address] [place], [place] [address]  . Color, Urine 01/10/2020 YELLOW* YELLOW Final  . APPearance 01/10/2020 CLEAR* CLEAR Final  . Specific Gravity, Urine 01/10/2020 1.019  1.005 - 1.030 Final  . pH 01/10/2020 6.0  5.0 - 8.0 Final  . Glucose, UA 01/10/2020 NEGATIVE  NEGATIVE mg/dL Final  . Hgb urine dipstick 01/10/2020 NEGATIVE  NEGATIVE Final  . Bilirubin Urine 01/10/2020 NEGATIVE  NEGATIVE Final  . Ketones, ur 01/10/2020 NEGATIVE  NEGATIVE mg/dL Final  . Protein, ur 01/10/2020 NEGATIVE  NEGATIVE mg/dL Final  .  Nitrite 01/10/2020 NEGATIVE  NEGATIVE Final  . Chalmers Guest 01/10/2020 NEGATIVE  NEGATIVE Final  . WBC, UA 01/10/2020 0-5  0 - 5 WBC/hpf Final  . Bacteria, UA 01/10/2020  NONE SEEN  NONE SEEN Final  . Squamous Epithelial / LPF 01/10/2020 0-5  0 - 5 Final  . Mucus 01/10/2020 PRESENT   Final   Performed at [facility], [address] [place], [place] [address]  . Troponin I (High Sensitivity) 01/10/2020 99* <18 ng/L Final   Comment: (NOTE) Elevated high sensitivity troponin I (hsTnI) values and significant  changes across serial measurements may suggest ACS but many other  chronic and acute conditions are known to elevate hsTnI results.  Refer to the "Links" section for chest pain algorithms and additional  guidance. Performed at [facility], [address] [place], [place] [address]   . SARS Coronavirus 2 01/10/2020 NEGATIVE  NEGATIVE Final   Comment: (NOTE) SARS-CoV-2 target nucleic acids are NOT DETECTED. The SARS-CoV-2 RNA is generally detectable in upper and lower respiratory specimens during the acute phase of infection. Negative results do not preclude SARS-CoV-2 infection, do not rule out co-infections with other pathogens, and should not be used as the sole basis for treatment or other patient management decisions. Negative results must be combined with clinical observations, patient history, and epidemiological information. The expected result is Negative. Fact Sheet for Patients: SugarRoll.be Fact Sheet for Healthcare Providers: https://www.woods-mathews.com/ This test is not yet approved or cleared by the [place] FDA and  has been authorized for detection and/or diagnosis of SARS-CoV-2 by FDA under an Emergency Use Authorization (EUA). This EUA will remain  in effect (meaning this test can be used) for the duration of the COVID-19 declaration under Section 56                          4(b)(1) of the  Act, 21 U.S.C. section 360bbb-3(b)(1), unless the authorization is terminated or revoked sooner. Performed at [facility], [address] [place], [place] [address]   . Troponin I (High Sensitivity) 01/10/2020 105* <18 ng/L Final   Comment: CRITICAL RESULT CALLED TO, READ BACK BY AND VERIFIED WITH RAQUAL DAVID AT 0425 ON 01/10/20 RWW (NOTE) Elevated high sensitivity troponin I (hsTnI) values and significant  changes across serial measurements may suggest ACS but many other  chronic and acute conditions are known to elevate hsTnI results.  Refer to the "Links" section for chest pain algorithms and additional  guidance. Performed at [facility], [address] [place], [place] [address]     RADIOGRAPHIC STUDIES: I have personally reviewed the radiological images as listed and agreed with the findings in the report. DG Chest 2 View  Result Date: 01/10/2020 CLINICAL DATA:  Left upper abdominal pain, chest pain EXAM: CHEST - 2 VIEW COMPARISON:  12/29/2017 FINDINGS: Heart and mediastinal contours are within normal limits. No focal opacities or effusions. No acute bony abnormality. IMPRESSION: Normal study. Electronically Signed   By: Rolm Baptise M.D.   On: 01/10/2020 02:24   CT ABDOMEN PELVIS W CONTRAST  Result Date: 01/10/2020 CLINICAL DATA:  Abdominal pain EXAM: CT ABDOMEN AND PELVIS WITH CONTRAST TECHNIQUE: Multidetector CT imaging of the abdomen and pelvis was performed using the standard protocol following bolus administration of intravenous contrast. Oral contrast was also administered CONTRAST:  122mL OMNIPAQUE IOHEXOL 300 MG/ML  SOLN COMPARISON:  June 24, 2019 FINDINGS: Lower chest: Lung bases are clear. Hepatobiliary: Liver has a nodular contour with hypertrophy of the caudate lobe and scattered areas of atrophy elsewhere, consistent with hepatic cirrhosis, stable. No focal liver lesions are appreciable. Gallbladder is somewhat contracted. Gallbladder  wall does not  appear overtly thickened. There is no biliary duct dilatation. Pancreas: There is no appreciable pancreatic mass or inflammatory focus. There is no pancreatic duct dilatation or peripancreatic fluid. Pancreatic enhancement is within normal limits. Spleen: Spleen measures 13.1 x 18.6 x 10.4 cm with a measured splenic volume of 1,267 cubic cm. No focal splenic lesions are appreciable. Adrenals/Urinary Tract: Adrenals bilaterally appear unremarkable. Kidneys bilaterally show no evident mass or hydronephrosis on either side. No evident renal or ureteral calculus. Urinary bladder wall thickness is within normal limits. Stomach/Bowel: There is no appreciable bowel wall or mesenteric thickening. No evident bowel obstruction. Terminal ileum appears normal. No free air or portal venous air. Vascular/Lymphatic: No abdominal aortic aneurysm. There is aortic atherosclerosis. There are extensive esophageal varices. There are multiple upper abdominal varices, particularly in the perisplenic region but seen throughout the upper abdomen. Umbilical vein is recanalized with umbilical region venous prominence consistent with the caput medusae appearance. There are mildly prominent vessels in the perirectal region, likely also variceal in etiology. All venous structures appear patent on this study. No adenopathy is evident in the abdomen or pelvis. Reproductive: Uterus is anteverted.  No pelvic masses are evident. Other: There is no periappendiceal region inflammation. There is no evident abscess or ascites in the abdomen or pelvis. Musculoskeletal: No blastic or lytic bone lesions. No intramuscular lesions are evident. IMPRESSION: 1.  Hepatic cirrhosis without focal liver lesions evident. 2. Splenomegaly with splenic volume measured 1,267 cubic cm. No focal splenic lesions evident. 3. Extensive varices at multiple sites as summarized. All venous structures appear patent. 4. No bowel obstruction. No abscess in the abdomen or pelvis. No  periappendiceal region inflammation. 5.  Normal appearing pancreas. 6.  Aortic Atherosclerosis (ICD10-I70.0). Electronically Signed   By: Lowella Grip III M.D.   On: 01/10/2020 12:08    Assessment:  Brandi Bright is a 49 y.o. female with history of liver cirrhosis, hepatitis C, splenomegaly, portal hypertension, chronic pancytopenia present for follow-up.  1. Iron deficiency anemia due to chronic blood loss   2. Thrombocytopenia (HCC)   3. Leukopenia, unspecified type   4. Normocytic anemia    #Iron deficiency anemia, Labs reviewed and discussed with patient.  Hemoglobin 11, ferritin 7.  Persistently iron deficient. Patient's gastroenterologist also communicated with me and recommend patient to have IV iron infusions. IV iron was discussed with patient again.   Plan IV iron with Venofer 200mg  weekly x 4 doses. Allergy reactions/infusion reaction including anaphylactic reaction discussed with patient. Other side effects include but not limited to high blood pressure, skin rash, weight gain, leg swelling, etc. Patient voices understanding and willing to proceed.  #Copper deficiency, copper level has improved and resolved. ##Thrombocytopenia, secondary to splenomegaly/chronic liver disease. For future elective procedures, can consider TPO for a week followed by elective procedures.  Discussed with patient. Platelet is stable. #Leukopenia and also leukocytopenia secondary to chronic liver disease, cirrhosis, portal hypertension, splenomegaly. Continue to monitor.  Follow up in 8 weeks for repeat iron TIBC, ferritin, cbc and MD assessment  We spent sufficient time to discuss many aspect of care, questions were answered to patient's satisfaction.   Earlie Server, MD, PhD Hematology Oncology [facility] at [facility] Pager- [phone] 01/19/2020

## 2020-01-23 DIAGNOSIS — E669 Obesity, unspecified: Secondary | ICD-10-CM | POA: Diagnosis not present

## 2020-01-23 DIAGNOSIS — R0602 Shortness of breath: Secondary | ICD-10-CM | POA: Diagnosis not present

## 2020-01-23 DIAGNOSIS — R079 Chest pain, unspecified: Secondary | ICD-10-CM | POA: Diagnosis not present

## 2020-01-23 DIAGNOSIS — E039 Hypothyroidism, unspecified: Secondary | ICD-10-CM | POA: Diagnosis not present

## 2020-01-23 DIAGNOSIS — I10 Essential (primary) hypertension: Secondary | ICD-10-CM | POA: Diagnosis not present

## 2020-01-24 ENCOUNTER — Ambulatory Visit: Payer: Medicaid Other | Admitting: Gastroenterology

## 2020-01-25 DIAGNOSIS — R079 Chest pain, unspecified: Secondary | ICD-10-CM | POA: Diagnosis not present

## 2020-01-25 DIAGNOSIS — E669 Obesity, unspecified: Secondary | ICD-10-CM | POA: Diagnosis not present

## 2020-01-29 ENCOUNTER — Other Ambulatory Visit: Payer: Self-pay

## 2020-01-29 ENCOUNTER — Inpatient Hospital Stay: Payer: Medicaid Other

## 2020-01-29 VITALS — BP 114/76 | HR 67 | Temp 98.2°F | Resp 18

## 2020-01-29 DIAGNOSIS — D509 Iron deficiency anemia, unspecified: Secondary | ICD-10-CM | POA: Diagnosis not present

## 2020-01-29 MED ORDER — SODIUM CHLORIDE 0.9 % IV SOLN
Freq: Once | INTRAVENOUS | Status: AC
  Filled 2020-01-29: qty 250

## 2020-01-29 MED ORDER — IRON SUCROSE 20 MG/ML IV SOLN
200.0000 mg | Freq: Once | INTRAVENOUS | Status: AC
  Administered 2020-01-29: 200 mg via INTRAVENOUS
  Filled 2020-01-29: qty 10

## 2020-01-31 ENCOUNTER — Other Ambulatory Visit: Payer: Self-pay | Admitting: Gastroenterology

## 2020-02-05 ENCOUNTER — Inpatient Hospital Stay: Payer: Medicaid Other

## 2020-02-05 ENCOUNTER — Other Ambulatory Visit: Payer: Self-pay

## 2020-02-05 VITALS — BP 130/82 | HR 72 | Resp 18

## 2020-02-05 DIAGNOSIS — D509 Iron deficiency anemia, unspecified: Secondary | ICD-10-CM

## 2020-02-05 MED ORDER — SODIUM CHLORIDE 0.9 % IV SOLN
Freq: Once | INTRAVENOUS | Status: AC
  Filled 2020-02-05: qty 250

## 2020-02-05 MED ORDER — IRON SUCROSE 20 MG/ML IV SOLN
200.0000 mg | Freq: Once | INTRAVENOUS | Status: AC
  Administered 2020-02-05: 200 mg via INTRAVENOUS
  Filled 2020-02-05: qty 10

## 2020-02-12 ENCOUNTER — Inpatient Hospital Stay: Payer: Medicaid Other | Attending: Oncology

## 2020-02-12 ENCOUNTER — Other Ambulatory Visit: Payer: Self-pay

## 2020-02-12 DIAGNOSIS — D509 Iron deficiency anemia, unspecified: Secondary | ICD-10-CM | POA: Insufficient documentation

## 2020-02-12 NOTE — Progress Notes (Signed)
Multiple RNs attempted to get an IV but were unsuccessful. MD and treatment team made aware. Pt aware of new appointment on 02/22/2020 to attempt another Venofer infusion. Pt updated and all questions answered.   Warren Lindahl [employer]

## 2020-02-19 ENCOUNTER — Other Ambulatory Visit: Payer: Medicaid Other

## 2020-02-21 ENCOUNTER — Ambulatory Visit: Payer: Medicaid Other | Admitting: Oncology

## 2020-02-22 ENCOUNTER — Inpatient Hospital Stay: Payer: Medicaid Other

## 2020-02-23 DIAGNOSIS — E039 Hypothyroidism, unspecified: Secondary | ICD-10-CM | POA: Diagnosis not present

## 2020-02-23 DIAGNOSIS — M722 Plantar fascial fibromatosis: Secondary | ICD-10-CM | POA: Diagnosis not present

## 2020-02-23 DIAGNOSIS — B958 Unspecified staphylococcus as the cause of diseases classified elsewhere: Secondary | ICD-10-CM | POA: Diagnosis not present

## 2020-02-23 DIAGNOSIS — Z20822 Contact with and (suspected) exposure to covid-19: Secondary | ICD-10-CM | POA: Diagnosis not present

## 2020-02-29 DIAGNOSIS — E034 Atrophy of thyroid (acquired): Secondary | ICD-10-CM | POA: Diagnosis not present

## 2020-02-29 DIAGNOSIS — B188 Other chronic viral hepatitis: Secondary | ICD-10-CM | POA: Diagnosis not present

## 2020-02-29 DIAGNOSIS — I10 Essential (primary) hypertension: Secondary | ICD-10-CM | POA: Diagnosis not present

## 2020-02-29 DIAGNOSIS — R5381 Other malaise: Secondary | ICD-10-CM | POA: Diagnosis not present

## 2020-03-01 DIAGNOSIS — E034 Atrophy of thyroid (acquired): Secondary | ICD-10-CM | POA: Diagnosis not present

## 2020-03-01 DIAGNOSIS — M722 Plantar fascial fibromatosis: Secondary | ICD-10-CM | POA: Diagnosis not present

## 2020-03-01 DIAGNOSIS — B958 Unspecified staphylococcus as the cause of diseases classified elsewhere: Secondary | ICD-10-CM | POA: Diagnosis not present

## 2020-03-01 DIAGNOSIS — E039 Hypothyroidism, unspecified: Secondary | ICD-10-CM | POA: Diagnosis not present

## 2020-03-07 DIAGNOSIS — M25512 Pain in left shoulder: Secondary | ICD-10-CM | POA: Diagnosis not present

## 2020-03-07 DIAGNOSIS — Z79899 Other long term (current) drug therapy: Secondary | ICD-10-CM | POA: Diagnosis not present

## 2020-03-07 DIAGNOSIS — M542 Cervicalgia: Secondary | ICD-10-CM | POA: Diagnosis not present

## 2020-03-07 DIAGNOSIS — M545 Low back pain: Secondary | ICD-10-CM | POA: Diagnosis not present

## 2020-03-07 DIAGNOSIS — G894 Chronic pain syndrome: Secondary | ICD-10-CM | POA: Diagnosis not present

## 2020-03-13 ENCOUNTER — Other Ambulatory Visit: Payer: Self-pay

## 2020-03-13 ENCOUNTER — Inpatient Hospital Stay: Payer: Medicaid Other

## 2020-03-13 DIAGNOSIS — D509 Iron deficiency anemia, unspecified: Secondary | ICD-10-CM | POA: Diagnosis not present

## 2020-03-13 DIAGNOSIS — D500 Iron deficiency anemia secondary to blood loss (chronic): Secondary | ICD-10-CM

## 2020-03-13 LAB — CBC WITH DIFFERENTIAL/PLATELET
Abs Immature Granulocytes: 0 10*3/uL (ref 0.00–0.07)
Basophils Absolute: 0 10*3/uL (ref 0.0–0.1)
Basophils Relative: 1 %
Eosinophils Absolute: 0.1 10*3/uL (ref 0.0–0.5)
Eosinophils Relative: 3 %
HCT: 33.8 % — ABNORMAL LOW (ref 36.0–46.0)
Hemoglobin: 11.3 g/dL — ABNORMAL LOW (ref 12.0–15.0)
Immature Granulocytes: 0 %
Lymphocytes Relative: 33 %
Lymphs Abs: 0.6 10*3/uL — ABNORMAL LOW (ref 0.7–4.0)
MCH: 28.9 pg (ref 26.0–34.0)
MCHC: 33.4 g/dL (ref 30.0–36.0)
MCV: 86.4 fL (ref 80.0–100.0)
Monocytes Absolute: 0.2 10*3/uL (ref 0.1–1.0)
Monocytes Relative: 10 %
Neutro Abs: 0.9 10*3/uL — ABNORMAL LOW (ref 1.7–7.7)
Neutrophils Relative %: 53 %
Platelets: 35 10*3/uL — ABNORMAL LOW (ref 150–400)
RBC: 3.91 MIL/uL (ref 3.87–5.11)
RDW: 14.6 % (ref 11.5–15.5)
WBC: 1.7 10*3/uL — ABNORMAL LOW (ref 4.0–10.5)
nRBC: 0 % (ref 0.0–0.2)

## 2020-03-13 LAB — IRON AND TIBC
Iron: 125 ug/dL (ref 28–170)
Saturation Ratios: 34 % — ABNORMAL HIGH (ref 10.4–31.8)
TIBC: 365 ug/dL (ref 250–450)
UIBC: 240 ug/dL

## 2020-03-13 LAB — FERRITIN: Ferritin: 11 ng/mL (ref 11–307)

## 2020-03-15 ENCOUNTER — Inpatient Hospital Stay: Payer: Medicaid Other

## 2020-03-15 ENCOUNTER — Other Ambulatory Visit: Payer: Self-pay

## 2020-03-15 ENCOUNTER — Inpatient Hospital Stay: Payer: Medicaid Other | Attending: Oncology | Admitting: Oncology

## 2020-03-15 ENCOUNTER — Encounter: Payer: Self-pay | Admitting: Oncology

## 2020-03-15 VITALS — BP 116/79 | HR 72 | Temp 97.7°F | Resp 18 | Wt 201.8 lb

## 2020-03-15 DIAGNOSIS — D696 Thrombocytopenia, unspecified: Secondary | ICD-10-CM | POA: Diagnosis not present

## 2020-03-15 DIAGNOSIS — D72819 Decreased white blood cell count, unspecified: Secondary | ICD-10-CM | POA: Diagnosis not present

## 2020-03-15 DIAGNOSIS — Z800 Family history of malignant neoplasm of digestive organs: Secondary | ICD-10-CM | POA: Insufficient documentation

## 2020-03-15 DIAGNOSIS — R161 Splenomegaly, not elsewhere classified: Secondary | ICD-10-CM | POA: Insufficient documentation

## 2020-03-15 DIAGNOSIS — R790 Abnormal level of blood mineral: Secondary | ICD-10-CM

## 2020-03-15 DIAGNOSIS — D500 Iron deficiency anemia secondary to blood loss (chronic): Secondary | ICD-10-CM | POA: Diagnosis not present

## 2020-03-15 DIAGNOSIS — D61818 Other pancytopenia: Secondary | ICD-10-CM | POA: Diagnosis not present

## 2020-03-15 DIAGNOSIS — Z801 Family history of malignant neoplasm of trachea, bronchus and lung: Secondary | ICD-10-CM | POA: Diagnosis not present

## 2020-03-15 DIAGNOSIS — I252 Old myocardial infarction: Secondary | ICD-10-CM | POA: Insufficient documentation

## 2020-03-15 DIAGNOSIS — E538 Deficiency of other specified B group vitamins: Secondary | ICD-10-CM | POA: Diagnosis not present

## 2020-03-15 MED ORDER — FERROUS SULFATE 325 (65 FE) MG PO TBEC: 325.0000 mg | DELAYED_RELEASE_TABLET | Freq: Every day | ORAL | 2 refills | Status: DC

## 2020-03-15 MED ORDER — CYANOCOBALAMIN 1000 MCG PO TABS: 1000.0000 ug | ORAL_TABLET | Freq: Every day | ORAL | 1 refills | Status: DC

## 2020-03-15 NOTE — Progress Notes (Signed)
Patient here for follow up. Reports not being able to eat much due to nausea.

## 2020-03-15 NOTE — Progress Notes (Signed)
[facility] day:  03/15/2020  Chief Complaint: Brandi Bright is a 49 y.o. female follows up for management of iron deficiency anemia, thrombocytopenia, and leukopenia   PERTINENT HEMATOLOGY HISTORY Patient follows up with Dr. Mike Gip previously.  Establish care with me on 02/09/2019. Reviewed patient's previous medical records, labs, imaging results. # History of cirrhosis and hepatitis C.  She has a history of chronic anemia, thrombocytopenia and leukopenia dating back to 03/2012.  Platelet count has fluctuated between 54,000 - 56,000 since 12/28/2017 (previously 73,000 - 134,000).  Ferritin was 14 on 08/31/2018.    Work-up on 09/23/2018 revealed a hematocrit of 31.0, hemoglobin 10.1, MCV 87.6, platelets 46,000, WBC 1900 with an ANC of 1100.  Ferritin was 10 (low) with iron saturation 16% with a TIBC of 401.  Retic was 1.2%.  B12 was 315.  Normal studies included: folate, SPEP, and free light chain ratio.  Copper was 69 (72-166).  ANA was + with double stranded DNA antibody 13 (0-9).  TSH was 0.036 (0.35-4.5) with a free T4 1.05 (0.82-1.77).  Peripheral smear revealed variant lymphocytes.  Ferritin has been followed:  14 on 08/31/2018 and 10 on 09/23/2018.  Abdomen and pelvic CT on 07/14/2018 revealed cirrhosis with portal hypertension noted by prominent splenomegaly (18.5 x 13.8 x 8.1 cm; volume 1100 cm3), enlarged portal veins with recannulated umbilical vein, paraesophageal and perigastric varices.  There was mild thickening of the cecum and ascending colon.  EGD on 10/27/2018 revealed grade II esophageal varices.  There was erythematous mucosa in the antrum.  There was congested, erythematous, friable (with contact bleeding), granular and nodular mucosa in the gastric fundus and astric body.  There was a single gastric polyp (polypoid ulcerated antral type mucos with chronic active mucosal inflammation).  There was a normal duodenal bulb, second  portion of the duodenum and examined duodenum.  The patient's iron deficiency could be explained by her friable gastric mucosa (likely from portal hypertension).  There was no history of variceal bleeding and thus this is not a cause of her iron deficiency.  # admitted from 10/11/2019 to 10/12/2019 due to rectal bleeding EGD 10/12/2019 showed portal hypertensive gastropathy, treated with APC.  Nonbleeding large esophageal varices incompletely evaluated.  Banded. Patient follows with gastroenterology and had EGD on 11/20/2019. Banding was not done due to large amount of food in the stomach. There was plan for EGD on 11/23/2019.  Her gastroenterologist Dr. Bonna Gains contacted me to see if patient can get platelet transfusion to improve her platelet counts for banding on 11/23/2019.     INTERVAL HISTORY Brandi Bright is a 49 y.o. female who has above history reviewed by me today presents for follow up visit for management of thrombocytopenia, splenomegaly, leukopenia and anemia. Problems and complaints are listed below: S/p IV venofer treatments. She feels fatigue has improved. No bleeding events. No fever chills, recent infections.   Review of Systems  Constitutional: Positive for fatigue. Negative for appetite change, chills and fever.  HENT:   Negative for hearing loss and voice change.   Eyes: Negative for eye problems.  Respiratory: Negative for chest tightness and cough.   Cardiovascular: Negative for chest pain.  Gastrointestinal: Negative for abdominal distention, abdominal pain and blood in stool.  Endocrine: Negative for hot flashes.  Genitourinary: Negative for difficulty urinating and frequency.   Musculoskeletal: Negative for arthralgias.  Skin: Negative for itching and rash.  Neurological: Negative for extremity weakness.  Hematological: Negative for adenopathy.  Bruises/bleeds easily.  Psychiatric/Behavioral: Negative for confusion.   Past Medical History:  Diagnosis Date  .  Arthritis   . Back pain   . Gout   . Hypothyroidism   . IDA (iron deficiency anemia) 01/16/2020  . MI, old   . Thyroid disease     Past Surgical History:  Procedure Laterality Date  . CARDIAC CATHETERIZATION    . CESAREAN SECTION    . COLONOSCOPY WITH PROPOFOL N/A 03/16/2018   Procedure: COLONOSCOPY WITH PROPOFOL;  Surgeon: Virgel Manifold, MD;  Location: [facility] ENDOSCOPY;  Service: Endoscopy;  Laterality: N/A;  . ESOPHAGOGASTRODUODENOSCOPY N/A 10/11/2019   Procedure: ESOPHAGOGASTRODUODENOSCOPY (EGD);  Surgeon: Lin Landsman, MD;  Location: [facility] ENDOSCOPY;  Service: Gastroenterology;  Laterality: N/A;  . ESOPHAGOGASTRODUODENOSCOPY (EGD) WITH PROPOFOL N/A 10/27/2018   Procedure: ESOPHAGOGASTRODUODENOSCOPY (EGD) WITH PROPOFOL;  Surgeon: Virgel Manifold, MD;  Location: [facility] ENDOSCOPY;  Service: Endoscopy;  Laterality: N/A;  . ESOPHAGOGASTRODUODENOSCOPY (EGD) WITH PROPOFOL N/A 06/25/2019   Procedure: ESOPHAGOGASTRODUODENOSCOPY (EGD) WITH PROPOFOL;  Surgeon: Jonathon Bellows, MD;  Location: [facility] ENDOSCOPY;  Service: Gastroenterology;  Laterality: N/A;  . ESOPHAGOGASTRODUODENOSCOPY (EGD) WITH PROPOFOL N/A 09/12/2019   Procedure: ESOPHAGOGASTRODUODENOSCOPY (EGD) WITH PROPOFOL;  Surgeon: Lucilla Lame, MD;  Location: [facility] ENDOSCOPY;  Service: Endoscopy;  Laterality: N/A;  . ESOPHAGOGASTRODUODENOSCOPY (EGD) WITH PROPOFOL N/A 11/20/2019   Procedure: ESOPHAGOGASTRODUODENOSCOPY (EGD) WITH PROPOFOL;  Surgeon: Virgel Manifold, MD;  Location: [facility] ENDOSCOPY;  Service: Endoscopy;  Laterality: N/A;  . ESOPHAGOGASTRODUODENOSCOPY (EGD) WITH PROPOFOL N/A 11/23/2019   Procedure: ESOPHAGOGASTRODUODENOSCOPY (EGD) WITH PROPOFOL;  Surgeon: Virgel Manifold, MD;  Location: [facility] ENDOSCOPY;  Service: Endoscopy;  Laterality: N/A;  . ESOPHAGOGASTRODUODENOSCOPY (EGD) WITH PROPOFOL N/A 01/03/2020   Procedure: ESOPHAGOGASTRODUODENOSCOPY (EGD) WITH PROPOFOL;  Surgeon: Lin Landsman, MD;  Location: [facility]  ENDOSCOPY;  Service: Endoscopy;  Laterality: N/A;  . TUBAL LIGATION      Family History  Problem Relation Age of Onset  . Hypertension Other   . CAD Father   . Colon cancer Father   . Lung cancer Father   . Pancreatic cancer Brother    Social History   Socioeconomic History  . Marital status: Single    Spouse name: Not on file  . Number of children: Not on file  . Years of education: Not on file  . Highest education level: Not on file  Occupational History  . Not on file  Tobacco Use  . Smoking status: Never Smoker  . Smokeless tobacco: Never Used  Substance and Sexual Activity  . Alcohol use: Yes    Comment: rarely  . Drug use: Never  . Sexual activity: Not Currently  Other Topics Concern  . Not on file  Social History Narrative  . Not on file   Social Determinants of Health   Financial Resource Strain: Medium Risk  . Difficulty of Paying Living Expenses: Somewhat hard  Food Insecurity: No Food Insecurity  . Worried About Charity fundraiser in the Last Year: Never true  . Ran Out of Food in the Last Year: Never true  Transportation Needs: No Transportation Needs  . Lack of Transportation (Medical): No  . Lack of Transportation (Non-Medical): No  Physical Activity: Inactive  . Days of Exercise per Week: 0 days  . Minutes of Exercise per Session: 0 min  Stress: Stress Concern Present  . Feeling of Stress : Rather much  Social Connections: Unknown  . Frequency of Communication with Friends and Family: More than three times a week  . Frequency  of Social Gatherings with Friends and Family: More than three times a week  . Attends Religious Services: Never  . Active Member of Clubs or Organizations: No  . Attends Archivist Meetings: Not asked  . Marital Status: Not on file  Intimate Partner Violence: Not At Risk  . Fear of Current or Ex-Partner: No  . Emotionally Abused: No  . Physically Abused: No  . Sexually Abused: No    Allergies:  Allergies   Allergen Reactions  . Vicodin [Hydrocodone-Acetaminophen] Rash  . Amoxicillin Rash and Other (See Comments)    Has patient had a PCN reaction causing immediate rash, facial/tongue/throat swelling, SOB or lightheadedness with hypotension: Yes Has patient had a PCN reaction causing severe rash involving mucus membranes or skin necrosis: No Has patient had a PCN reaction that required hospitalization: No Has patient had a PCN reaction occurring within the last 10 years: No If all of the above answers are "NO", then may proceed with Cephalosporin use.   . Gabapentin Rash    Current Medications: Current Outpatient Medications  Medication Sig Dispense Refill  . aspirin EC 81 MG EC tablet Take 1 tablet (81 mg total) by mouth daily. 90 tablet 0  . [employer] VITAMIN B12 1000 MCG tablet TAKE 1 TABLET BY MOUTH EVERY DAY 90 tablet 1  . diclofenac Sodium (VOLTAREN) 1 % GEL Apply 5 g topically 4 (four) times daily.    . Iron-Vitamin C (VITRON-C) 65-125 MG TABS Take 1 tablet by mouth daily. 30 tablet 3  . lactulose (CHRONULAC) 10 GM/15ML solution TAKE 15 MLS (10 G TOTAL) BY MOUTH DAILY FOR 30 DAYS. 473 mL 3  . levothyroxine (SYNTHROID) 150 MCG tablet Take 150 mcg by mouth daily before breakfast.    . nitroGLYCERIN (NITROLINGUAL) 0.4 MG/SPRAY spray Place 1 spray under the tongue every 5 (five) minutes x 3 doses as needed for chest pain.    Marland Kitchen oxyCODONE-acetaminophen (PERCOCET/ROXICET) 5-325 MG tablet Take 1 tablet by mouth 4 (four) times daily.     . pantoprazole (PROTONIX) 40 MG tablet TAKE 1 TABLET BY MOUTH TWICE A DAY 60 tablet 1  . spironolactone (ALDACTONE) 50 MG tablet Take 50 mg by mouth daily.  5  . venlafaxine XR (EFFEXOR-XR) 75 MG 24 hr capsule Take 75 mg by mouth daily.   4  . ferrous sulfate 325 (65 FE) MG EC tablet Take 1 tablet (325 mg total) by mouth daily with breakfast. 30 tablet 2   No current facility-administered medications for this visit.     Physical Exam: Blood pressure 116/79,  pulse 72, temperature 97.7 F (36.5 C), resp. rate 18, weight 201 lb 12.8 oz (91.5 kg). Physical Exam  Constitutional: She is oriented to person, place, and time. No distress.  HENT:  Head: Normocephalic and atraumatic.  Nose: Nose normal.  Mouth/Throat: Oropharynx is clear and moist. No oropharyngeal exudate.  Chronic right frontal mass.  Eyes: Pupils are equal, round, and reactive to light. EOM are normal. No scleral icterus.  Cardiovascular: Normal rate and regular rhythm.  No murmur heard. Pulmonary/Chest: Effort normal and breath sounds normal. No respiratory distress. She has no rales. She exhibits no tenderness.  Abdominal: Soft. Bowel sounds are normal. She exhibits no distension. There is no abdominal tenderness.  Splenomegaly  Musculoskeletal:        General: No edema. Normal range of motion.     Cervical back: Normal range of motion and neck supple.  Neurological: She is alert and oriented to person, place,  and time. No cranial nerve deficit. She exhibits normal muscle tone. Coordination normal.  Skin: Skin is warm and dry. She is not diaphoretic. No erythema.  Psychiatric: Affect normal.    Appointment on 03/13/2020  Component Date Value Ref Range Status  . Iron 03/13/2020 125  28 - 170 ug/dL Final  . TIBC 03/13/2020 365  250 - 450 ug/dL Final  . Saturation Ratios 03/13/2020 34* 10.4 - 31.8 % Final  . UIBC 03/13/2020 240  ug/dL Final   Performed at [facility], [address] [place], [place] [address]  . Ferritin 03/13/2020 11  11 - 307 ng/mL Final   Performed at [facility], [address] [place], [place] [address]  . WBC 03/13/2020 1.7* 4.0 - 10.5 K/uL Final  . RBC 03/13/2020 3.91  3.87 - 5.11 MIL/uL Final  . Hemoglobin 03/13/2020 11.3* 12.0 - 15.0 g/dL Final  . HCT 03/13/2020 33.8* 36.0 - 46.0 % Final  . MCV 03/13/2020 86.4  80.0 - 100.0 fL Final  . MCH 03/13/2020 28.9  26.0 - 34.0 pg Final  . MCHC 03/13/2020 33.4  30.0 - 36.0 g/dL Final   . RDW 03/13/2020 14.6  11.5 - 15.5 % Final  . Platelets 03/13/2020 35* 150 - 400 K/uL Final   Comment: Immature Platelet Fraction may be clinically indicated, consider ordering this additional test [id number]   . nRBC 03/13/2020 0.0  0.0 - 0.2 % Final  . Neutrophils Relative % 03/13/2020 53  % Final  . Neutro Abs 03/13/2020 0.9* 1.7 - 7.7 K/uL Final  . Lymphocytes Relative 03/13/2020 33  % Final  . Lymphs Abs 03/13/2020 0.6* 0.7 - 4.0 K/uL Final  . Monocytes Relative 03/13/2020 10  % Final  . Monocytes Absolute 03/13/2020 0.2  0.1 - 1.0 K/uL Final  . Eosinophils Relative 03/13/2020 3  % Final  . Eosinophils Absolute 03/13/2020 0.1  0.0 - 0.5 K/uL Final  . Basophils Relative 03/13/2020 1  % Final  . Basophils Absolute 03/13/2020 0.0  0.0 - 0.1 K/uL Final  . Immature Granulocytes 03/13/2020 0  % Final  . Abs Immature Granulocytes 03/13/2020 0.00  0.00 - 0.07 K/uL Final   Performed at [facility], [address] [place], [place] [address]    RADIOGRAPHIC STUDIES: I have personally reviewed the radiological images as listed and agreed with the findings in the report. DG Chest 2 View  Result Date: 01/10/2020 CLINICAL DATA:  Left upper abdominal pain, chest pain EXAM: CHEST - 2 VIEW COMPARISON:  12/29/2017 FINDINGS: Heart and mediastinal contours are within normal limits. No focal opacities or effusions. No acute bony abnormality. IMPRESSION: Normal study. Electronically Signed   By: Rolm Baptise M.D.   On: 01/10/2020 02:24   CT ABDOMEN PELVIS W CONTRAST  Result Date: 01/10/2020 CLINICAL DATA:  Abdominal pain EXAM: CT ABDOMEN AND PELVIS WITH CONTRAST TECHNIQUE: Multidetector CT imaging of the abdomen and pelvis was performed using the standard protocol following bolus administration of intravenous contrast. Oral contrast was also administered CONTRAST:  188m OMNIPAQUE IOHEXOL 300 MG/ML  SOLN COMPARISON:  June 24, 2019 FINDINGS: Lower chest: Lung bases are clear. Hepatobiliary:  Liver has a nodular contour with hypertrophy of the caudate lobe and scattered areas of atrophy elsewhere, consistent with hepatic cirrhosis, stable. No focal liver lesions are appreciable. Gallbladder is somewhat contracted. Gallbladder wall does not appear overtly thickened. There is no biliary duct dilatation. Pancreas: There is no appreciable pancreatic mass or inflammatory focus. There is no pancreatic  duct dilatation or peripancreatic fluid. Pancreatic enhancement is within normal limits. Spleen: Spleen measures 13.1 x 18.6 x 10.4 cm with a measured splenic volume of 1,267 cubic cm. No focal splenic lesions are appreciable. Adrenals/Urinary Tract: Adrenals bilaterally appear unremarkable. Kidneys bilaterally show no evident mass or hydronephrosis on either side. No evident renal or ureteral calculus. Urinary bladder wall thickness is within normal limits. Stomach/Bowel: There is no appreciable bowel wall or mesenteric thickening. No evident bowel obstruction. Terminal ileum appears normal. No free air or portal venous air. Vascular/Lymphatic: No abdominal aortic aneurysm. There is aortic atherosclerosis. There are extensive esophageal varices. There are multiple upper abdominal varices, particularly in the perisplenic region but seen throughout the upper abdomen. Umbilical vein is recanalized with umbilical region venous prominence consistent with the caput medusae appearance. There are mildly prominent vessels in the perirectal region, likely also variceal in etiology. All venous structures appear patent on this study. No adenopathy is evident in the abdomen or pelvis. Reproductive: Uterus is anteverted.  No pelvic masses are evident. Other: There is no periappendiceal region inflammation. There is no evident abscess or ascites in the abdomen or pelvis. Musculoskeletal: No blastic or lytic bone lesions. No intramuscular lesions are evident. IMPRESSION: 1.  Hepatic cirrhosis without focal liver lesions  evident. 2. Splenomegaly with splenic volume measured 1,267 cubic cm. No focal splenic lesions evident. 3. Extensive varices at multiple sites as summarized. All venous structures appear patent. 4. No bowel obstruction. No abscess in the abdomen or pelvis. No periappendiceal region inflammation. 5.  Normal appearing pancreas. 6.  Aortic Atherosclerosis (ICD10-I70.0). Electronically Signed   By: Lowella Grip III M.D.   On: 01/10/2020 12:08    Assessment:  ZELENE BARGA is a 49 y.o. female with history of liver cirrhosis, hepatitis C, splenomegaly, portal hypertension, chronic pancytopenia present for follow-up.  1. Thrombocytopenia (HCC)   2. Leukopenia, unspecified type   3. Low vitamin B12 level   4. Low serum copper for age   21. Iron deficiency anemia due to chronic blood loss    #Iron deficiency anemia, Labs reviewed and discussed with patient.  Hemoglobin 11.3, ferritin 11.   Recommend patient to continue oral iron supplementation.   # Pancytopenia, likely due to chronic liver disease/cirrhosis.  Thrombocytopenia -splenomegaly.  For future elective procedures, can consider TPO for a week followed by elective procedures. Worsening leukopenia,  Discussed about bone marrow biopsy to rule out underlying BM disease. She agrees.    #Copper deficiency, copper level has improved and resolved. She is not taking Copper. Check level at next visit.  # History of Vitamin b12 deficiency. Check B12 at next visit.    Follow up after Bone marrow biopsy.  We spent sufficient time to discuss many aspect of care, questions were answered to patient's satisfaction.   Brandi Server, MD, PhD Hematology Oncology [facility] Pager- [phone] 03/15/2020

## 2020-03-18 ENCOUNTER — Telehealth: Payer: Self-pay

## 2020-03-18 ENCOUNTER — Other Ambulatory Visit: Payer: Self-pay

## 2020-03-18 DIAGNOSIS — R790 Abnormal level of blood mineral: Secondary | ICD-10-CM

## 2020-03-18 DIAGNOSIS — D696 Thrombocytopenia, unspecified: Secondary | ICD-10-CM

## 2020-03-18 DIAGNOSIS — E538 Deficiency of other specified B group vitamins: Secondary | ICD-10-CM

## 2020-03-18 NOTE — Telephone Encounter (Signed)
Patient scheduled for BMB on 4/12 @ 8:30.  Please schedule patient for labs 2 weeks after and MD 1-2 days after labs. I will call her with appts. Thanks

## 2020-03-18 NOTE — Telephone Encounter (Signed)
Patient has been notified of all appts.

## 2020-03-18 NOTE — Telephone Encounter (Signed)
Done...  Pt have been scheduled as requested. Labs on 04/08/20 @ 115 2 weeks after BX and MD on 04/10/20 @ 1015 2 days after labs.Marland Kitchen

## 2020-03-21 NOTE — Progress Notes (Signed)
Patient on schedule for BMB 03/25/20, made aware to be NPO after MN, be here @ 0730, and have driver for home after recovery, stated understanding

## 2020-03-22 ENCOUNTER — Other Ambulatory Visit: Payer: Self-pay | Admitting: Student

## 2020-03-22 DIAGNOSIS — E039 Hypothyroidism, unspecified: Secondary | ICD-10-CM | POA: Diagnosis not present

## 2020-03-22 DIAGNOSIS — L02811 Cutaneous abscess of head [any part, except face]: Secondary | ICD-10-CM | POA: Diagnosis not present

## 2020-03-25 ENCOUNTER — Other Ambulatory Visit: Payer: Self-pay

## 2020-03-25 ENCOUNTER — Ambulatory Visit
Admission: RE | Admit: 2020-03-25 | Discharge: 2020-03-25 | Disposition: A | Discharge status: Home / Self Care | Payer: Medicaid Other | Source: Ambulatory Visit | Attending: Oncology | Admitting: Oncology

## 2020-03-25 DIAGNOSIS — I252 Old myocardial infarction: Secondary | ICD-10-CM | POA: Diagnosis not present

## 2020-03-25 DIAGNOSIS — E039 Hypothyroidism, unspecified: Secondary | ICD-10-CM | POA: Insufficient documentation

## 2020-03-25 DIAGNOSIS — D696 Thrombocytopenia, unspecified: Secondary | ICD-10-CM | POA: Diagnosis not present

## 2020-03-25 DIAGNOSIS — K7460 Unspecified cirrhosis of liver: Secondary | ICD-10-CM | POA: Insufficient documentation

## 2020-03-25 DIAGNOSIS — Z7982 Long term (current) use of aspirin: Secondary | ICD-10-CM | POA: Insufficient documentation

## 2020-03-25 DIAGNOSIS — D72819 Decreased white blood cell count, unspecified: Secondary | ICD-10-CM

## 2020-03-25 DIAGNOSIS — Z79890 Hormone replacement therapy (postmenopausal): Secondary | ICD-10-CM | POA: Diagnosis not present

## 2020-03-25 DIAGNOSIS — D61818 Other pancytopenia: Secondary | ICD-10-CM | POA: Diagnosis not present

## 2020-03-25 LAB — CBC WITH DIFFERENTIAL/PLATELET
Abs Immature Granulocytes: 0.01 10*3/uL (ref 0.00–0.07)
Basophils Absolute: 0 10*3/uL (ref 0.0–0.1)
Basophils Relative: 1 %
Eosinophils Absolute: 0.1 10*3/uL (ref 0.0–0.5)
Eosinophils Relative: 3 %
HCT: 34 % — ABNORMAL LOW (ref 36.0–46.0)
Hemoglobin: 11.4 g/dL — ABNORMAL LOW (ref 12.0–15.0)
Immature Granulocytes: 1 %
Lymphocytes Relative: 37 %
Lymphs Abs: 0.7 10*3/uL (ref 0.7–4.0)
MCH: 28.6 pg (ref 26.0–34.0)
MCHC: 33.5 g/dL (ref 30.0–36.0)
MCV: 85.2 fL (ref 80.0–100.0)
Monocytes Absolute: 0.2 10*3/uL (ref 0.1–1.0)
Monocytes Relative: 8 %
Neutro Abs: 1 10*3/uL — ABNORMAL LOW (ref 1.7–7.7)
Neutrophils Relative %: 50 %
Platelets: 39 10*3/uL — ABNORMAL LOW (ref 150–400)
RBC: 3.99 MIL/uL (ref 3.87–5.11)
RDW: 14.6 % (ref 11.5–15.5)
WBC: 2 10*3/uL — ABNORMAL LOW (ref 4.0–10.5)
nRBC: 0 % (ref 0.0–0.2)

## 2020-03-25 MED ORDER — HEPARIN SOD (PORK) LOCK FLUSH 100 UNIT/ML IV SOLN
INTRAVENOUS | Status: AC
  Filled 2020-03-25: qty 5

## 2020-03-25 MED ORDER — MIDAZOLAM HCL 5 MG/5ML IJ SOLN
INTRAMUSCULAR | Status: AC
  Filled 2020-03-25: qty 5

## 2020-03-25 MED ORDER — FENTANYL CITRATE (PF) 100 MCG/2ML IJ SOLN
INTRAMUSCULAR | Status: AC | PRN
  Administered 2020-03-25 (×2): 50 ug via INTRAVENOUS

## 2020-03-25 MED ORDER — FENTANYL CITRATE (PF) 100 MCG/2ML IJ SOLN
INTRAMUSCULAR | Status: AC
  Filled 2020-03-25: qty 2

## 2020-03-25 MED ORDER — SODIUM CHLORIDE 0.9 % IV SOLN: INTRAVENOUS | Status: DC

## 2020-03-25 MED ORDER — MIDAZOLAM HCL 2 MG/2ML IJ SOLN
INTRAMUSCULAR | Status: AC | PRN
  Administered 2020-03-25 (×3): 1 mg via INTRAVENOUS

## 2020-03-25 NOTE — Progress Notes (Signed)
Patient clinically stable post BMB per DR Anselm Pancoast, tolerated well. Awake/alert and oriented post procedure. Vitals stable throughout. Received Versed 3mg  along with Fentanyl 100 mcg IV for procedure. Denies complaints at this time. Dr Anselm Pancoast out to speak with patient post procedure. Report given to [facility] RN with questions answered.

## 2020-03-25 NOTE — Procedures (Signed)
Interventional Radiology Procedure:   Indications: Pancytopenia, cirrhosis, thrombocytopenia.  Procedure: CT guided bone marrow biopsy  Findings: 2 aspirates and 2 cores from right ilium  Complications: None     EBL: Minimal, less than 10 ml  Plan: Discharge to home in one hour.   Ellizabeth Dacruz R. Anselm Pancoast, MD  Pager: [phone]

## 2020-03-25 NOTE — H&P (Signed)
Chief Complaint: Patient was seen in consultation today for CT-guided bone marrow biopsy at the request of Brandi Bright  Referring Physician(s): Brandi Bright  Patient Status: Brandi Bright - Out-pt  History of Present Illness: Brandi Bright is a 49 y.o. female with history of hepatitis C, cirrhosis and portal hypertension.  Bone marrow biopsy has been requested for management of the thrombocytopenia, leukopenia and anemia.  History of esophageal varices.  Patient has chronic knee pain and the right knee is bothering her today.  She has chronic shortness of breath with activity.  She denies chest pain, GI issues or GU symptoms.  She denies fevers or chills.    Past Medical History:  Diagnosis Date  . Arthritis   . Back pain   . Gout   . Hypothyroidism   . IDA (iron deficiency anemia) 01/16/2020  . MI, old   . Thyroid disease     Past Surgical History:  Procedure Laterality Date  . CARDIAC CATHETERIZATION    . CESAREAN SECTION    . COLONOSCOPY WITH PROPOFOL N/A 03/16/2018   Procedure: COLONOSCOPY WITH PROPOFOL;  Surgeon: Virgel Manifold, MD;  Location: [facility] ENDOSCOPY;  Service: Endoscopy;  Laterality: N/A;  . ESOPHAGOGASTRODUODENOSCOPY N/A 10/11/2019   Procedure: ESOPHAGOGASTRODUODENOSCOPY (EGD);  Surgeon: Lin Landsman, MD;  Location: [facility] ENDOSCOPY;  Service: Gastroenterology;  Laterality: N/A;  . ESOPHAGOGASTRODUODENOSCOPY (EGD) WITH PROPOFOL N/A 10/27/2018   Procedure: ESOPHAGOGASTRODUODENOSCOPY (EGD) WITH PROPOFOL;  Surgeon: Virgel Manifold, MD;  Location: [facility] ENDOSCOPY;  Service: Endoscopy;  Laterality: N/A;  . ESOPHAGOGASTRODUODENOSCOPY (EGD) WITH PROPOFOL N/A 06/25/2019   Procedure: ESOPHAGOGASTRODUODENOSCOPY (EGD) WITH PROPOFOL;  Surgeon: Jonathon Bellows, MD;  Location: [facility] ENDOSCOPY;  Service: Gastroenterology;  Laterality: N/A;  . ESOPHAGOGASTRODUODENOSCOPY (EGD) WITH PROPOFOL N/A 09/12/2019   Procedure: ESOPHAGOGASTRODUODENOSCOPY (EGD) WITH PROPOFOL;  Surgeon: Lucilla Lame,  MD;  Location: [facility] ENDOSCOPY;  Service: Endoscopy;  Laterality: N/A;  . ESOPHAGOGASTRODUODENOSCOPY (EGD) WITH PROPOFOL N/A 11/20/2019   Procedure: ESOPHAGOGASTRODUODENOSCOPY (EGD) WITH PROPOFOL;  Surgeon: Virgel Manifold, MD;  Location: [facility] ENDOSCOPY;  Service: Endoscopy;  Laterality: N/A;  . ESOPHAGOGASTRODUODENOSCOPY (EGD) WITH PROPOFOL N/A 11/23/2019   Procedure: ESOPHAGOGASTRODUODENOSCOPY (EGD) WITH PROPOFOL;  Surgeon: Virgel Manifold, MD;  Location: [facility] ENDOSCOPY;  Service: Endoscopy;  Laterality: N/A;  . ESOPHAGOGASTRODUODENOSCOPY (EGD) WITH PROPOFOL N/A 01/03/2020   Procedure: ESOPHAGOGASTRODUODENOSCOPY (EGD) WITH PROPOFOL;  Surgeon: Lin Landsman, MD;  Location: [facility] ENDOSCOPY;  Service: Endoscopy;  Laterality: N/A;  . TUBAL LIGATION      Allergies: Vicodin [hydrocodone-acetaminophen], Amoxicillin, and Gabapentin  Medications: Prior to Admission medications   Medication Sig Start Date End Date Taking? Authorizing Provider  aspirin EC 81 MG EC tablet Take 1 tablet (81 mg total) by mouth daily. 01/11/20  Yes Lorella Nimrod, MD  cyanocobalamin ([facility] VITAMIN B12) 1000 MCG tablet Take 1 tablet (1,000 mcg total) by mouth daily. 03/15/20  Yes Earlie Server, MD  ferrous sulfate 325 (65 FE) MG EC tablet Take 1 tablet (325 mg total) by mouth daily with breakfast. 03/15/20  Yes Earlie Server, MD  levothyroxine (SYNTHROID) 150 MCG tablet Take 150 mcg by mouth daily before breakfast.   Yes [provider]  oxyCODONE-acetaminophen (PERCOCET/ROXICET) 5-325 MG tablet Take 1 tablet by mouth 4 (four) times daily.  01/01/20  Yes [provider]  pantoprazole (PROTONIX) 40 MG tablet TAKE 1 TABLET BY MOUTH TWICE A DAY 01/31/20  Yes [name] B, MD  spironolactone (ALDACTONE) 50 MG tablet Take 50 mg by mouth daily. 08/21/18  Yes [provider]  venlafaxine XR (EFFEXOR-XR) 75 MG 24 hr capsule Take 75 mg by mouth daily.  08/22/18  Yes [provider]  diclofenac Sodium  (VOLTAREN) 1 % GEL Apply 5 g topically 4 (four) times daily. 01/01/20   [provider]  lactulose (CHRONULAC) 10 GM/15ML solution TAKE 15 MLS (10 G TOTAL) BY MOUTH DAILY FOR 30 DAYS. Patient not taking: Reported on 03/25/2020 08/24/19   Virgel Manifold, MD  nitroGLYCERIN (NITROLINGUAL) 0.4 MG/SPRAY spray Place 1 spray under the tongue every 5 (five) minutes x 3 doses as needed for chest pain.    [provider]     Family History  Problem Relation Age of Onset  . Hypertension Other   . CAD Father   . Colon cancer Father   . Lung cancer Father   . Pancreatic cancer Brother     Social History   Socioeconomic History  . Marital status: Single    Spouse name: Not on file  . Number of children: Not on file  . Years of education: Not on file  . Highest education level: Not on file  Occupational History  . Not on file  Tobacco Use  . Smoking status: Never Smoker  . Smokeless tobacco: Never Used  Substance and Sexual Activity  . Alcohol use: Yes    Comment: rarely  . Drug use: Never  . Sexual activity: Not Currently  Other Topics Concern  . Not on file  Social History Narrative  . Not on file   Social Determinants of Health   Financial Resource Strain: Medium Risk  . Difficulty of Paying Living Expenses: Somewhat hard  Food Insecurity: No Food Insecurity  . Worried About Charity fundraiser in the Last Year: Never true  . Ran Out of Food in the Last Year: Never true  Transportation Needs: No Transportation Needs  . Lack of Transportation (Medical): No  . Lack of Transportation (Non-Medical): No  Physical Activity: Inactive  . Days of Exercise per Week: 0 days  . Minutes of Exercise per Session: 0 min  Stress: Stress Concern Present  . Feeling of Stress : Rather much  Social Connections: Unknown  . Frequency of Communication with Friends and Family: More than three times a week  . Frequency of Social Gatherings with Friends and Family: More than  three times a week  . Attends Religious Services: Never  . Active Member of Clubs or Organizations: No  . Attends Archivist Meetings: Not asked  . Marital Status: Not on file      Review of Systems: A 12 point ROS discussed and pertinent positives are indicated in the HPI above.  All other systems are negative.  Review of Systems  Constitutional: Negative.   Respiratory: Positive for shortness of breath.   Cardiovascular: Negative.   Gastrointestinal: Negative.   Genitourinary: Negative.     Vital Signs: BP 126/72   Pulse 78   Temp 98.2 F (36.8 C) (Oral)   Resp 17   Ht _0  (1.448 m)   Wt 87.1 kg   LMP  (LMP Unknown) Comment: last menstrual 9 years ago  SpO2 97%   BMI 41.55 kg/m   Physical Exam Cardiovascular:     Rate and Rhythm: Normal rate and regular rhythm.  Pulmonary:     Effort: Pulmonary effort is normal.     Breath sounds: Normal breath sounds.  Abdominal:     General: Abdomen is flat. Bowel sounds are normal.  Neurological:  Mental Status: She is alert.     Imaging: No results found.  Labs:  CBC: Recent Labs    01/03/20 1052 01/10/20 0159 03/13/20 1136 03/25/20 0759  WBC 1.7* 2.4* 1.7* 2.0*  HGB 10.7* 11.1* 11.3* 11.4*  HCT 33.7* 34.2* 33.8* 34.0*  PLT 36* 45* 35* 39*    COAGS: Recent Labs    06/28/19 1515 11/06/19 1418  INR 1.3* 1.2    BMP: Recent Labs    10/11/19 0348 11/06/19 1418 11/21/19 1125 01/10/20 0159  NA 141 144 139 140  K 3.4* 3.7 3.5 3.6  CL 110 110* 109 109  CO2 _0 GLUCOSE 119* 91 99 111*  BUN 5* _1 CALCIUM 8.5* 9.2 8.6* 8.8*  CREATININE 0.53 0.62 0.48 0.48  GFRNONAA >60 106 >60 >60  GFRAA >60 122 >60 >60    LIVER FUNCTION TESTS: Recent Labs    10/10/19 2256 11/06/19 1418 11/21/19 1125 01/10/20 0159  BILITOT 1.4* 1.1 1.5* 0.9  AST 45* 40 40 39  ALT _2 ALKPHOS 121 134* 91 91  PROT 7.4 7.2 6.9 6.6  ALBUMIN 3.3* 3.7* 3.3* 3.3*    TUMOR MARKERS: No  results for input(s): AFPTM, CEA, CA199, CHROMGRNA in the last 8760 hours.  Assessment and Plan:  49 year old with cirrhosis, thrombocytopenia, leukopenia and anemia.  Request for bone marrow biopsy.  Risks and benefits of bone marrow biopsy was discussed with the patient and/or patient's family including, but not limited to bleeding, infection, damage to adjacent structures or low yield requiring additional tests.  All of the questions were answered and there is agreement to proceed.  Consent signed and in chart.   Thank you for this interesting consult.  I greatly enjoyed meeting MANALI MCELMURRY and look forward to participating in their care.  A copy of this report was sent to the requesting provider on this date.  Electronically Signed: Burman Riis, MD 03/25/2020, 8:30 AM   I spent a total of 10 minutes  in face to face in clinical consultation, greater than 50% of which was counseling/coordinating care for bone marrow biopsy.

## 2020-04-01 ENCOUNTER — Encounter (HOSPITAL_COMMUNITY): Payer: Self-pay | Admitting: Oncology

## 2020-04-01 LAB — SURGICAL PATHOLOGY

## 2020-04-06 ENCOUNTER — Other Ambulatory Visit: Payer: Self-pay | Admitting: Oncology

## 2020-04-08 ENCOUNTER — Inpatient Hospital Stay: Payer: Medicaid Other

## 2020-04-08 ENCOUNTER — Other Ambulatory Visit: Payer: Self-pay

## 2020-04-08 DIAGNOSIS — Z800 Family history of malignant neoplasm of digestive organs: Secondary | ICD-10-CM | POA: Diagnosis not present

## 2020-04-08 DIAGNOSIS — R161 Splenomegaly, not elsewhere classified: Secondary | ICD-10-CM | POA: Diagnosis not present

## 2020-04-08 DIAGNOSIS — D696 Thrombocytopenia, unspecified: Secondary | ICD-10-CM

## 2020-04-08 DIAGNOSIS — E538 Deficiency of other specified B group vitamins: Secondary | ICD-10-CM

## 2020-04-08 DIAGNOSIS — I252 Old myocardial infarction: Secondary | ICD-10-CM | POA: Diagnosis not present

## 2020-04-08 DIAGNOSIS — D500 Iron deficiency anemia secondary to blood loss (chronic): Secondary | ICD-10-CM | POA: Diagnosis not present

## 2020-04-08 DIAGNOSIS — D61818 Other pancytopenia: Secondary | ICD-10-CM | POA: Diagnosis not present

## 2020-04-08 DIAGNOSIS — R790 Abnormal level of blood mineral: Secondary | ICD-10-CM

## 2020-04-08 DIAGNOSIS — Z801 Family history of malignant neoplasm of trachea, bronchus and lung: Secondary | ICD-10-CM | POA: Diagnosis not present

## 2020-04-08 LAB — COMPREHENSIVE METABOLIC PANEL
ALT: 24 U/L (ref 0–44)
AST: 41 U/L (ref 15–41)
Albumin: 3.1 g/dL — ABNORMAL LOW (ref 3.5–5.0)
Alkaline Phosphatase: 115 U/L (ref 38–126)
Anion gap: 6 (ref 5–15)
BUN: 10 mg/dL (ref 6–20)
CO2: 24 mmol/L (ref 22–32)
Calcium: 8.7 mg/dL — ABNORMAL LOW (ref 8.9–10.3)
Chloride: 110 mmol/L (ref 98–111)
Creatinine, Ser: 0.47 mg/dL (ref 0.44–1.00)
GFR calc Af Amer: >60 mL/min (ref >60)
GFR calc non Af Amer: >60 mL/min (ref >60)
Glucose, Bld: 119 mg/dL — ABNORMAL HIGH (ref 70–99)
Potassium: 3.8 mmol/L (ref 3.5–5.1)
Sodium: 140 mmol/L (ref 135–145)
Total Bilirubin: 1.2 mg/dL (ref 0.3–1.2)
Total Protein: 6.5 g/dL (ref 6.5–8.1)

## 2020-04-08 LAB — CBC WITH DIFFERENTIAL/PLATELET
Abs Immature Granulocytes: 0.01 10*3/uL (ref 0.00–0.07)
Basophils Absolute: 0 10*3/uL (ref 0.0–0.1)
Basophils Relative: 1 %
Eosinophils Absolute: 0.1 10*3/uL (ref 0.0–0.5)
Eosinophils Relative: 3 %
HCT: 35.1 % — ABNORMAL LOW (ref 36.0–46.0)
Hemoglobin: 11.8 g/dL — ABNORMAL LOW (ref 12.0–15.0)
Immature Granulocytes: 1 %
Lymphocytes Relative: 33 %
Lymphs Abs: 0.7 10*3/uL (ref 0.7–4.0)
MCH: 29 pg (ref 26.0–34.0)
MCHC: 33.6 g/dL (ref 30.0–36.0)
MCV: 86.2 fL (ref 80.0–100.0)
Monocytes Absolute: 0.2 10*3/uL (ref 0.1–1.0)
Monocytes Relative: 8 %
Neutro Abs: 1.2 10*3/uL — ABNORMAL LOW (ref 1.7–7.7)
Neutrophils Relative %: 54 %
Platelets: 38 10*3/uL — ABNORMAL LOW (ref 150–400)
RBC: 4.07 MIL/uL (ref 3.87–5.11)
RDW: 14.7 % (ref 11.5–15.5)
WBC: 2.2 10*3/uL — ABNORMAL LOW (ref 4.0–10.5)
nRBC: 0 % (ref 0.0–0.2)

## 2020-04-08 LAB — VITAMIN B12: Vitamin B-12: 689 pg/mL (ref 180–914)

## 2020-04-10 ENCOUNTER — Inpatient Hospital Stay: Payer: Medicaid Other | Admitting: Oncology

## 2020-04-11 LAB — COPPER, SERUM: Copper: 76 ug/dL — ABNORMAL LOW (ref 80–158)

## 2020-04-16 ENCOUNTER — Telehealth: Payer: Self-pay

## 2020-04-16 NOTE — Telephone Encounter (Signed)
Patient informed of MD recommendation.

## 2020-04-16 NOTE — Telephone Encounter (Signed)
Patient was not able to come for the 04/10/20 f/u MD appt.  Please call the patient to get that appointment r/s. She has specific dates that she can NOT come due to other appointments.

## 2020-04-16 NOTE — Telephone Encounter (Signed)
Done....  Appt R/S from 04/10/20 as requested. Per pt request to RTC on 04/24/20

## 2020-04-16 NOTE — Telephone Encounter (Signed)
MD OK with 04/24/20 appt

## 2020-04-16 NOTE — Telephone Encounter (Signed)
-----   Message from Earlie Server, MD sent at 04/15/2020 11:02 PM EDT ----- Please advise patient to take over the counter copper supplementation.  She follows up with me in 3 months, cbc cmp thank you

## 2020-04-19 ENCOUNTER — Other Ambulatory Visit: Payer: Self-pay

## 2020-04-19 ENCOUNTER — Ambulatory Visit (INDEPENDENT_AMBULATORY_CARE_PROVIDER_SITE_OTHER): Payer: Medicaid Other | Admitting: Internal Medicine

## 2020-04-19 DIAGNOSIS — E039 Hypothyroidism, unspecified: Secondary | ICD-10-CM

## 2020-04-19 DIAGNOSIS — D696 Thrombocytopenia, unspecified: Secondary | ICD-10-CM | POA: Diagnosis not present

## 2020-04-20 LAB — TSH: TSH: 0.02 mIU/L — ABNORMAL LOW

## 2020-04-20 LAB — CBC WITH DIFFERENTIAL/PLATELET
Absolute Monocytes: 198 cells/uL — ABNORMAL LOW (ref 200–950)
Basophils Absolute: 20 cells/uL (ref 0–200)
Basophils Relative: 0.8 %
Eosinophils Absolute: 70 cells/uL (ref 15–500)
Eosinophils Relative: 2.8 %
HCT: 38 % (ref 35.0–45.0)
Hemoglobin: 12.7 g/dL (ref 11.7–15.5)
Lymphs Abs: 768 cells/uL — ABNORMAL LOW (ref 850–3900)
MCH: 28.8 pg (ref 27.0–33.0)
MCHC: 33.4 g/dL (ref 32.0–36.0)
MCV: 86.2 fL (ref 80.0–100.0)
MPV: 11.9 fL (ref 7.5–12.5)
Monocytes Relative: 7.9 %
Neutro Abs: 1445 cells/uL — ABNORMAL LOW (ref 1500–7800)
Neutrophils Relative %: 57.8 %
Platelets: 42 10*3/uL — ABNORMAL LOW (ref 140–400)
RBC: 4.41 10*6/uL (ref 3.80–5.10)
RDW: 14.2 % (ref 11.0–15.0)
Total Lymphocyte: 30.7 %
WBC: 2.5 10*3/uL — ABNORMAL LOW (ref 3.8–10.8)

## 2020-04-20 LAB — COMPLETE METABOLIC PANEL WITH GFR
AG Ratio: 1.1 (calc) (ref 1.0–2.5)
ALT: 22 U/L (ref 6–29)
AST: 42 U/L — ABNORMAL HIGH (ref 10–35)
Albumin: 3.3 g/dL — ABNORMAL LOW (ref 3.6–5.1)
Alkaline phosphatase (APISO): 123 U/L (ref 37–153)
BUN/Creatinine Ratio: 26 (calc) — ABNORMAL HIGH (ref 6–22)
BUN: 10 mg/dL (ref 7–25)
CO2: 23 mmol/L (ref 20–32)
Calcium: 8.5 mg/dL — ABNORMAL LOW (ref 8.6–10.4)
Chloride: 110 mmol/L (ref 98–110)
Creat: 0.38 mg/dL — ABNORMAL LOW (ref 0.50–1.05)
GFR, Est African American: 143 mL/min/{1.73_m2} (ref >=60)
GFR, Est Non African American: 124 mL/min/{1.73_m2} (ref >=60)
Globulin: 2.9 g/dL (calc) (ref 1.9–3.7)
Glucose, Bld: 95 mg/dL (ref 65–99)
Potassium: 4 mmol/L (ref 3.5–5.3)
Sodium: 141 mmol/L (ref 135–146)
Total Bilirubin: 1 mg/dL (ref 0.2–1.2)
Total Protein: 6.2 g/dL (ref 6.1–8.1)

## 2020-04-20 LAB — T3: T3, Total: 112 ng/dL (ref 76–181)

## 2020-04-20 LAB — T4: T4, Total: 6 ug/dL (ref 5.1–11.9)

## 2020-04-24 ENCOUNTER — Inpatient Hospital Stay: Payer: Medicaid Other | Admitting: Oncology

## 2020-04-24 ENCOUNTER — Ambulatory Visit (INDEPENDENT_AMBULATORY_CARE_PROVIDER_SITE_OTHER): Payer: Medicaid Other | Admitting: Internal Medicine

## 2020-04-24 ENCOUNTER — Other Ambulatory Visit: Payer: Self-pay

## 2020-04-24 VITALS — BP 136/86 | HR 74 | Wt 201.4 lb

## 2020-04-24 DIAGNOSIS — E039 Hypothyroidism, unspecified: Secondary | ICD-10-CM

## 2020-04-24 DIAGNOSIS — D709 Neutropenia, unspecified: Secondary | ICD-10-CM | POA: Insufficient documentation

## 2020-04-24 DIAGNOSIS — I208 Other forms of angina pectoris: Secondary | ICD-10-CM

## 2020-04-24 DIAGNOSIS — D696 Thrombocytopenia, unspecified: Secondary | ICD-10-CM

## 2020-04-24 DIAGNOSIS — I2089 Other forms of angina pectoris: Secondary | ICD-10-CM | POA: Insufficient documentation

## 2020-04-24 MED ORDER — LEVOTHYROXINE SODIUM 100 MCG PO TABS: 100.0000 ug | ORAL_TABLET | Freq: Every day | ORAL | 4 refills | Status: DC

## 2020-04-24 MED ORDER — NITROGLYCERIN 0.4 MG/SPRAY TL SOLN: 1.0000 | 2 refills | Status: DC | PRN

## 2020-04-24 NOTE — Progress Notes (Signed)
Established Patient Office Visit  Subjective:  Patient ID: Brandi Bright, female    DOB: 22-Jul-1971  Age: 49 y.o. MRN: [id number]  CC:  Chief Complaint  Patient presents with  . Lab results    HPI  Brandi Bright presents for lab results follow-up.  She was found to have low WBC count low platelet count and she is being followed up in the hematology clinic in [facility].  Patient other problems include coronary artery disease which is stable at the present time occasionally she gets angina and required nitroglycerin spray she has about 2 heart attack in the past  Past Medical History:  Diagnosis Date  . Arthritis   . Back pain   . Gout   . Hypothyroidism   . IDA (iron deficiency anemia) 01/16/2020  . MI, old   . Thyroid disease     Past Surgical History:  Procedure Laterality Date  . CARDIAC CATHETERIZATION    . CESAREAN SECTION    . COLONOSCOPY WITH PROPOFOL N/A 03/16/2018   Procedure: COLONOSCOPY WITH PROPOFOL;  Surgeon: Virgel Manifold, MD;  Location: [facility] ENDOSCOPY;  Service: Endoscopy;  Laterality: N/A;  . ESOPHAGOGASTRODUODENOSCOPY N/A 10/11/2019   Procedure: ESOPHAGOGASTRODUODENOSCOPY (EGD);  Surgeon: Lin Landsman, MD;  Location: [facility] ENDOSCOPY;  Service: Gastroenterology;  Laterality: N/A;  . ESOPHAGOGASTRODUODENOSCOPY (EGD) WITH PROPOFOL N/A 10/27/2018   Procedure: ESOPHAGOGASTRODUODENOSCOPY (EGD) WITH PROPOFOL;  Surgeon: Virgel Manifold, MD;  Location: [facility] ENDOSCOPY;  Service: Endoscopy;  Laterality: N/A;  . ESOPHAGOGASTRODUODENOSCOPY (EGD) WITH PROPOFOL N/A 06/25/2019   Procedure: ESOPHAGOGASTRODUODENOSCOPY (EGD) WITH PROPOFOL;  Surgeon: Jonathon Bellows, MD;  Location: [facility] ENDOSCOPY;  Service: Gastroenterology;  Laterality: N/A;  . ESOPHAGOGASTRODUODENOSCOPY (EGD) WITH PROPOFOL N/A 09/12/2019   Procedure: ESOPHAGOGASTRODUODENOSCOPY (EGD) WITH PROPOFOL;  Surgeon: Lucilla Lame, MD;  Location: [facility] ENDOSCOPY;  Service: Endoscopy;  Laterality: N/A;  .  ESOPHAGOGASTRODUODENOSCOPY (EGD) WITH PROPOFOL N/A 11/20/2019   Procedure: ESOPHAGOGASTRODUODENOSCOPY (EGD) WITH PROPOFOL;  Surgeon: Virgel Manifold, MD;  Location: [facility] ENDOSCOPY;  Service: Endoscopy;  Laterality: N/A;  . ESOPHAGOGASTRODUODENOSCOPY (EGD) WITH PROPOFOL N/A 11/23/2019   Procedure: ESOPHAGOGASTRODUODENOSCOPY (EGD) WITH PROPOFOL;  Surgeon: Virgel Manifold, MD;  Location: [facility] ENDOSCOPY;  Service: Endoscopy;  Laterality: N/A;  . ESOPHAGOGASTRODUODENOSCOPY (EGD) WITH PROPOFOL N/A 01/03/2020   Procedure: ESOPHAGOGASTRODUODENOSCOPY (EGD) WITH PROPOFOL;  Surgeon: Lin Landsman, MD;  Location: [facility] ENDOSCOPY;  Service: Endoscopy;  Laterality: N/A;  . TUBAL LIGATION      Family History  Problem Relation Age of Onset  . Hypertension Other   . CAD Father   . Colon cancer Father   . Lung cancer Father   . Pancreatic cancer Brother     Social History   Socioeconomic History  . Marital status: Single    Spouse name: Not on file  . Number of children: Not on file  . Years of education: Not on file  . Highest education level: Not on file  Occupational History  . Not on file  Tobacco Use  . Smoking status: Never Smoker  . Smokeless tobacco: Never Used  Substance and Sexual Activity  . Alcohol use: Yes    Comment: rarely  . Drug use: Never  . Sexual activity: Not Currently  Other Topics Concern  . Not on file  Social History Narrative  . Not on file   Social Determinants of Health   Financial Resource Strain: Medium Risk  . Difficulty of Paying Living Expenses: Somewhat hard  Food Insecurity: No Food Insecurity  . Worried About  Running Out of Food in the Last Year: Never true  . Ran Out of Food in the Last Year: Never true  Transportation Needs: No Transportation Needs  . Lack of Transportation (Medical): No  . Lack of Transportation (Non-Medical): No  Physical Activity: Inactive  . Days of Exercise per Week: 0 days  . Minutes of Exercise per Session:  0 min  Stress: Stress Concern Present  . Feeling of Stress : Rather much  Social Connections: Unknown  . Frequency of Communication with Friends and Family: More than three times a week  . Frequency of Social Gatherings with Friends and Family: More than three times a week  . Attends Religious Services: Never  . Active Member of Clubs or Organizations: No  . Attends Archivist Meetings: Not asked  . Marital Status: Not on file  Intimate Partner Violence: Not At Risk  . Fear of Current or Ex-Partner: No  . Emotionally Abused: No  . Physically Abused: No  . Sexually Abused: No     Current Outpatient Medications:  .  aspirin EC 81 MG EC tablet, Take 1 tablet (81 mg total) by mouth daily., Disp: 90 tablet, Rfl: 0 .  cyanocobalamin ([employer] VITAMIN B12) 1000 MCG tablet, Take 1 tablet (1,000 mcg total) by mouth daily., Disp: 90 tablet, Rfl: 1 .  diclofenac Sodium (VOLTAREN) 1 % GEL, Apply 5 g topically 4 (four) times daily., Disp: , Rfl:  .  ferrous sulfate 325 (65 FE) MG EC tablet, TAKE 1 TABLET BY MOUTH EVERY DAY WITH BREAKFAST, Disp: 30 tablet, Rfl: 2 .  lactulose (CHRONULAC) 10 GM/15ML solution, TAKE 15 MLS (10 G TOTAL) BY MOUTH DAILY FOR 30 DAYS., Disp: 473 mL, Rfl: 3 .  levothyroxine (SYNTHROID) 100 MCG tablet, Take 1 tablet (100 mcg total) by mouth daily before breakfast., Disp: 30 tablet, Rfl: 4 .  nitroGLYCERIN (NITROLINGUAL) 0.4 MG/SPRAY spray, Place 1 spray under the tongue every 5 (five) minutes x 3 doses as needed for chest pain., Disp: 12 g, Rfl: 2 .  oxyCODONE-acetaminophen (PERCOCET/ROXICET) 5-325 MG tablet, Take 1 tablet by mouth 4 (four) times daily. , Disp: , Rfl:  .  pantoprazole (PROTONIX) 40 MG tablet, TAKE 1 TABLET BY MOUTH TWICE A DAY, Disp: 60 tablet, Rfl: 1   Allergies  Allergen Reactions  . Vicodin [Hydrocodone-Acetaminophen] Rash  . Amoxicillin Rash and Other (See Comments)    Has patient had a PCN reaction causing immediate rash, facial/tongue/throat  swelling, SOB or lightheadedness with hypotension: Yes Has patient had a PCN reaction causing severe rash involving mucus membranes or skin necrosis: No Has patient had a PCN reaction that required hospitalization: No Has patient had a PCN reaction occurring within the last 10 years: No If all of the above answers are "NO", then may proceed with Cephalosporin use.   . Gabapentin Rash    ROS Review of Systems  Constitutional: Negative for activity change.  HENT: Negative for congestion.   Eyes: Negative for discharge.  Respiratory: Negative for chest tightness, shortness of breath and wheezing.   Cardiovascular: Positive for chest pain. Negative for leg swelling.  Endocrine: Negative for cold intolerance and heat intolerance.  Genitourinary: Negative for dysuria.  Musculoskeletal: Negative for arthralgias and back pain.  Neurological: Positive for dizziness.  Psychiatric/Behavioral: Negative for confusion.      Objective:    Physical Exam patient is a obese white female in no acute distress she is well-nourished, head is normocephalic atraumatic throat is clear on examination of the  eyes her eye movements are full without any jaundice neck is supple jugular venous pressure is not elevated there is no tracheal deviation and no adenopathy on examination of the cardiovascular system apical impulse not palpable first heart sound is normal and no murmur is audible chest examination revealed chest with clear on auscultation resonant on percussion abdomen is soft nontender without any hepatosplenomegaly no masses were felt.  There is no pedal edema on locomotor examination.  Neurological examination is nonfocal without any neurological sign.  Skin is normal and dry mood and affect is normal  BP 136/86 (BP Location: Left Arm, Patient Position: Sitting, Cuff Size: Large)   Pulse 74   Wt 201 lb 6.4 oz (91.4 kg)   LMP  (LMP Unknown) Comment: last menstrual 9 years ago, tubal ligation  BMI 43.58  kg/m  Wt Readings from Last 3 Encounters:  04/24/20 201 lb 6.4 oz (91.4 kg)  03/25/20 192 lb (87.1 kg)  03/15/20 201 lb 12.8 oz (91.5 kg)     Health Maintenance Due  Topic Date Due  . COVID-19 Vaccine (1) Never done  . TETANUS/TDAP  Never done  . PAP SMEAR-Modifier  Never done  . MAMMOGRAM  Never done    There are no preventive care reminders to display for this patient.  Lab Results  Component Value Date   TSH 0.02 (L) 04/19/2020   Lab Results  Component Value Date   WBC 2.5 (L) 04/19/2020   HGB 12.7 04/19/2020   HCT 38.0 04/19/2020   MCV 86.2 04/19/2020   PLT 42 (L) 04/19/2020   Lab Results  Component Value Date   NA 141 04/19/2020   K 4.0 04/19/2020   CO2 23 04/19/2020   GLUCOSE 95 04/19/2020   BUN 10 04/19/2020   CREATININE 0.38 (L) 04/19/2020   BILITOT 1.0 04/19/2020   ALKPHOS 115 04/08/2020   AST 42 (H) 04/19/2020   ALT 22 04/19/2020   PROT 6.2 04/19/2020   ALBUMIN 3.1 (L) 04/08/2020   CALCIUM 8.5 (L) 04/19/2020   ANIONGAP 6 04/08/2020   Lab Results  Component Value Date   CHOL 107 12/29/2017   Lab Results  Component Value Date   HDL 21 (L) 08/25/2013   Lab Results  Component Value Date   LDLCALC 67 08/25/2013   Lab Results  Component Value Date   TRIG 107 08/25/2013   No results found for: CHOLHDL Lab Results  Component Value Date   HGBA1C 4.9 12/29/2017      Assessment & Plan:   Problem List Items Addressed This Visit      Cardiovascular and Mediastinum   Angina of effort (HCC)   Relevant Medications   nitroGLYCERIN (NITROLINGUAL) 0.4 MG/SPRAY spray     Endocrine   Hypothyroid - Primary   Relevant Medications   levothyroxine (SYNTHROID) 100 MCG tablet     Other   Neutropenia (HCC)    Other Visit Diagnoses    Thrombocytopenia, unspecified ([place])          Meds ordered this encounter  Medications  . levothyroxine (SYNTHROID) 100 MCG tablet    Sig: Take 1 tablet (100 mcg total) by mouth daily before breakfast.     Dispense:  30 tablet    Refill:  4  . nitroGLYCERIN (NITROLINGUAL) 0.4 MG/SPRAY spray    Sig: Place 1 spray under the tongue every 5 (five) minutes x 3 doses as needed for chest pain.    Dispense:  12 g    Refill:  2  1. Acquired hypothyroidism Patient dose of Synthroid was reduced to 100 mcg p.o. daily she will come back for a checkup in a month after her blood is drawn.  She was given an order to have a blood drawn for TSH in a month - levothyroxine (SYNTHROID) 100 MCG tablet; Take 1 tablet (100 mcg total) by mouth daily before breakfast.  Dispense: 30 tablet; Refill: 4  2. Angina of effort ([place]) Stable at the present time she will given nitroglycerin spray - nitroGLYCERIN (NITROLINGUAL) 0.4 MG/SPRAY spray; Place 1 spray under the tongue every 5 (five) minutes x 3 doses as needed for chest pain.  Dispense: 12 g; Refill: 2  3. Neutropenia, unspecified type [facility]) Follow-up with the hematology oncology  4. Thrombocytopenia, unspecified ([place]) Follow-up with hematology  Follow-up: Return in about 1 month (around 05/25/2020) for follow  up.    Cletis Athens, MD

## 2020-04-25 ENCOUNTER — Inpatient Hospital Stay: Payer: Medicaid Other | Attending: Oncology | Admitting: Oncology

## 2020-04-25 ENCOUNTER — Encounter: Payer: Self-pay | Admitting: Oncology

## 2020-04-25 VITALS — BP 128/86 | HR 66 | Temp 96.5°F | Resp 18 | Wt 201.6 lb

## 2020-04-25 DIAGNOSIS — B1920 Unspecified viral hepatitis C without hepatic coma: Secondary | ICD-10-CM | POA: Diagnosis not present

## 2020-04-25 DIAGNOSIS — Z800 Family history of malignant neoplasm of digestive organs: Secondary | ICD-10-CM | POA: Insufficient documentation

## 2020-04-25 DIAGNOSIS — K766 Portal hypertension: Secondary | ICD-10-CM | POA: Diagnosis not present

## 2020-04-25 DIAGNOSIS — R161 Splenomegaly, not elsewhere classified: Secondary | ICD-10-CM | POA: Diagnosis not present

## 2020-04-25 DIAGNOSIS — Z801 Family history of malignant neoplasm of trachea, bronchus and lung: Secondary | ICD-10-CM | POA: Diagnosis not present

## 2020-04-25 DIAGNOSIS — D696 Thrombocytopenia, unspecified: Secondary | ICD-10-CM

## 2020-04-25 DIAGNOSIS — D72819 Decreased white blood cell count, unspecified: Secondary | ICD-10-CM | POA: Diagnosis not present

## 2020-04-25 DIAGNOSIS — E610 Copper deficiency: Secondary | ICD-10-CM | POA: Insufficient documentation

## 2020-04-25 DIAGNOSIS — I252 Old myocardial infarction: Secondary | ICD-10-CM | POA: Diagnosis not present

## 2020-04-25 DIAGNOSIS — D500 Iron deficiency anemia secondary to blood loss (chronic): Secondary | ICD-10-CM | POA: Diagnosis not present

## 2020-04-25 DIAGNOSIS — D709 Neutropenia, unspecified: Secondary | ICD-10-CM | POA: Diagnosis not present

## 2020-04-25 DIAGNOSIS — K7460 Unspecified cirrhosis of liver: Secondary | ICD-10-CM | POA: Diagnosis not present

## 2020-04-25 NOTE — Progress Notes (Signed)
Patient here for follow up. Reports having mild nausea.

## 2020-04-25 NOTE — Progress Notes (Signed)
Brandi Bright  Chief Complaint: Brandi Bright is a 49 y.o. female follows up for management of iron deficiency anemia, thrombocytopenia, and leukopenia   PERTINENT HEMATOLOGY HISTORY Patient follows up with Dr. Mike Gip previously.  Establish care with me on 02/09/2019. Reviewed patient's previous medical records, labs, imaging results. # History of cirrhosis and hepatitis C.  She has a history of chronic anemia, thrombocytopenia and leukopenia dating back to 03/2012.  Platelet count has fluctuated between 54,000 - 56,000 since 12/28/2017 (previously 73,000 - 134,000).  Ferritin was 14 on 08/31/2018.    Work-up on 09/23/2018 revealed a hematocrit of 31.0, hemoglobin 10.1, MCV 87.6, platelets 46,000, WBC 1900 with an ANC of 1100.  Ferritin was 10 (low) with iron saturation 16% with a TIBC of 401.  Retic was 1.2%.  B12 was 315.  Normal studies included: folate, SPEP, and free light chain ratio.  Copper was 69 (72-166).  ANA was + with double stranded DNA antibody 13 (0-9).  TSH was 0.036 (0.35-4.5) with a free T4 1.05 (0.82-1.77).  Peripheral smear revealed variant lymphocytes.  Ferritin has been followed:  14 on 08/31/2018 and 10 on 09/23/2018.  Abdomen and pelvic CT on 07/14/2018 revealed cirrhosis with portal hypertension noted by prominent splenomegaly (18.5 x 13.8 x 8.1 cm; volume 1100 cm3), enlarged portal veins with recannulated umbilical vein, paraesophageal and perigastric varices.  There was mild thickening of the cecum and ascending colon.  EGD on 10/27/2018 revealed grade II esophageal varices.  There was erythematous mucosa in the antrum.  There was congested, erythematous, friable (with contact bleeding), granular and nodular mucosa in the gastric fundus and astric body.  There was a single gastric polyp (polypoid ulcerated antral type mucos with chronic active mucosal inflammation).  There was a normal duodenal bulb, second  portion of the duodenum and examined duodenum.  The patient's iron deficiency could be explained by her friable gastric mucosa (likely from portal hypertension).  There was no history of variceal bleeding and thus this is not a cause of her iron deficiency.  # admitted from 10/11/2019 to 10/12/2019 due to rectal bleeding EGD 10/12/2019 showed portal hypertensive gastropathy, treated with APC.  Nonbleeding large esophageal varices incompletely evaluated.  Banded. Patient follows with gastroenterology and had EGD on 11/20/2019. Banding was not done due to large amount of food in the stomach. There was plan for EGD on 11/23/2019.  Her gastroenterologist Dr. Bonna Gains contacted me to see if patient can get platelet transfusion to improve her platelet counts for banding on 11/23/2019.     INTERVAL HISTORY Brandi Bright is a 49 y.o. female who has above history reviewed by me today presents for follow up visit for management of thrombocytopenia, splenomegaly, leukopenia and anemia. Problems and complaints are listed below: She has bone marrow biopsy during the interval.presents to discuss results. No new complaints. .   Review of Systems  Constitutional: Positive for fatigue. Negative for appetite change, chills and fever.  HENT:   Negative for hearing loss and voice change.   Eyes: Negative for eye problems.  Respiratory: Negative for chest tightness and cough.   Cardiovascular: Negative for chest pain.  Gastrointestinal: Negative for abdominal distention, abdominal pain and blood in stool.  Endocrine: Negative for hot flashes.  Genitourinary: Negative for difficulty urinating and frequency.   Musculoskeletal: Negative for arthralgias.  Skin: Negative for itching and rash.  Neurological: Negative for extremity weakness.  Hematological: Negative for adenopathy. Bruises/bleeds easily.  Psychiatric/Behavioral: Negative for confusion.   Past Medical History:  Diagnosis Date  . Arthritis   .  Back pain   . Gout   . Hypothyroidism   . IDA (iron deficiency anemia) 01/16/2020  . MI, old   . Thyroid disease     Past Surgical History:  Procedure Laterality Date  . CARDIAC CATHETERIZATION    . CESAREAN SECTION    . COLONOSCOPY WITH PROPOFOL N/A 03/16/2018   Procedure: COLONOSCOPY WITH PROPOFOL;  Surgeon: Virgel Manifold, MD;  Location: [facility] ENDOSCOPY;  Service: Endoscopy;  Laterality: N/A;  . ESOPHAGOGASTRODUODENOSCOPY N/A 10/11/2019   Procedure: ESOPHAGOGASTRODUODENOSCOPY (EGD);  Surgeon: Lin Landsman, MD;  Location: [facility] ENDOSCOPY;  Service: Gastroenterology;  Laterality: N/A;  . ESOPHAGOGASTRODUODENOSCOPY (EGD) WITH PROPOFOL N/A 10/27/2018   Procedure: ESOPHAGOGASTRODUODENOSCOPY (EGD) WITH PROPOFOL;  Surgeon: Virgel Manifold, MD;  Location: [facility] ENDOSCOPY;  Service: Endoscopy;  Laterality: N/A;  . ESOPHAGOGASTRODUODENOSCOPY (EGD) WITH PROPOFOL N/A 06/25/2019   Procedure: ESOPHAGOGASTRODUODENOSCOPY (EGD) WITH PROPOFOL;  Surgeon: Jonathon Bellows, MD;  Location: [facility] ENDOSCOPY;  Service: Gastroenterology;  Laterality: N/A;  . ESOPHAGOGASTRODUODENOSCOPY (EGD) WITH PROPOFOL N/A 09/12/2019   Procedure: ESOPHAGOGASTRODUODENOSCOPY (EGD) WITH PROPOFOL;  Surgeon: Lucilla Lame, MD;  Location: [facility] ENDOSCOPY;  Service: Endoscopy;  Laterality: N/A;  . ESOPHAGOGASTRODUODENOSCOPY (EGD) WITH PROPOFOL N/A 11/20/2019   Procedure: ESOPHAGOGASTRODUODENOSCOPY (EGD) WITH PROPOFOL;  Surgeon: Virgel Manifold, MD;  Location: [facility] ENDOSCOPY;  Service: Endoscopy;  Laterality: N/A;  . ESOPHAGOGASTRODUODENOSCOPY (EGD) WITH PROPOFOL N/A 11/23/2019   Procedure: ESOPHAGOGASTRODUODENOSCOPY (EGD) WITH PROPOFOL;  Surgeon: Virgel Manifold, MD;  Location: [facility] ENDOSCOPY;  Service: Endoscopy;  Laterality: N/A;  . ESOPHAGOGASTRODUODENOSCOPY (EGD) WITH PROPOFOL N/A 01/03/2020   Procedure: ESOPHAGOGASTRODUODENOSCOPY (EGD) WITH PROPOFOL;  Surgeon: Lin Landsman, MD;  Location: [facility] ENDOSCOPY;   Service: Endoscopy;  Laterality: N/A;  . TUBAL LIGATION      Family History  Problem Relation Age of Onset  . Hypertension Other   . CAD Father   . Colon cancer Father   . Lung cancer Father   . Pancreatic cancer Brother    Social History   Socioeconomic History  . Marital status: Single    Spouse name: Not on file  . Number of children: Not on file  . Years of education: Not on file  . Highest education level: Not on file  Occupational History  . Not on file  Tobacco Use  . Smoking status: Never Smoker  . Smokeless tobacco: Never Used  Substance and Sexual Activity  . Alcohol use: Yes    Comment: rarely  . Drug use: Never  . Sexual activity: Not Currently  Other Topics Concern  . Not on file  Social History Narrative  . Not on file   Social Determinants of Health   Financial Resource Strain: Medium Risk  . Difficulty of Paying Living Expenses: Somewhat hard  Food Insecurity: No Food Insecurity  . Worried About Charity fundraiser in the Last Year: Never true  . Ran Out of Food in the Last Year: Never true  Transportation Needs: No Transportation Needs  . Lack of Transportation (Medical): No  . Lack of Transportation (Non-Medical): No  Physical Activity: Inactive  . Days of Exercise per Week: 0 days  . Minutes of Exercise per Session: 0 min  Stress: Stress Concern Present  . Feeling of Stress : Rather much  Social Connections: Unknown  . Frequency of Communication with Friends and Family: More than three times a week  . Frequency of Social Gatherings  with Friends and Family: More than three times a week  . Attends Religious Services: Never  . Active Member of Clubs or Organizations: No  . Attends Archivist Meetings: Not asked  . Marital Status: Not on file  Intimate Partner Violence: Not At Risk  . Fear of Current or Ex-Partner: No  . Emotionally Abused: No  . Physically Abused: No  . Sexually Abused: No    Allergies:  Allergies  Allergen  Reactions  . Vicodin [Hydrocodone-Acetaminophen] Rash  . Amoxicillin Rash and Other (See Comments)    Has patient had a PCN reaction causing immediate rash, facial/tongue/throat swelling, SOB or lightheadedness with hypotension: Yes Has patient had a PCN reaction causing severe rash involving mucus membranes or skin necrosis: No Has patient had a PCN reaction that required hospitalization: No Has patient had a PCN reaction occurring within the last 10 years: No If all of the above answers are "NO", then may proceed with Cephalosporin use.   . Gabapentin Rash    Current Medications: Current Outpatient Medications  Medication Sig Dispense Refill  . aspirin EC 81 MG EC tablet Take 1 tablet (81 mg total) by mouth daily. 90 tablet 0  . cyanocobalamin ([employer] VITAMIN B12) 1000 MCG tablet Take 1 tablet (1,000 mcg total) by mouth daily. 90 tablet 1  . diclofenac Sodium (VOLTAREN) 1 % GEL Apply 5 g topically 4 (four) times daily.    . ferrous sulfate 325 (65 FE) MG EC tablet TAKE 1 TABLET BY MOUTH EVERY DAY WITH BREAKFAST 30 tablet 2  . lactulose (CHRONULAC) 10 GM/15ML solution TAKE 15 MLS (10 G TOTAL) BY MOUTH DAILY FOR 30 DAYS. 473 mL 3  . levothyroxine (SYNTHROID) 100 MCG tablet Take 1 tablet (100 mcg total) by mouth daily before breakfast. 30 tablet 4  . nitroGLYCERIN (NITROLINGUAL) 0.4 MG/SPRAY spray Place 1 spray under the tongue every 5 (five) minutes x 3 doses as needed for chest pain. 12 g 2  . oxyCODONE-acetaminophen (PERCOCET/ROXICET) 5-325 MG tablet Take 1 tablet by mouth 4 (four) times daily.     . pantoprazole (PROTONIX) 40 MG tablet TAKE 1 TABLET BY MOUTH TWICE A DAY 60 tablet 1   No current facility-administered medications for this visit.     Physical Exam: Blood pressure 128/86, pulse 66, temperature (!) 96.5 F (35.8 C), resp. rate 18, weight 201 lb 9.6 oz (91.4 kg). Physical Exam  Constitutional: She is oriented to person, place, and time. No distress.  HENT:  Head:  Normocephalic and atraumatic.  Nose: Nose normal.  Mouth/Throat: Oropharynx is clear and moist. No oropharyngeal exudate.  Chronic right frontal mass.  Eyes: Pupils are equal, round, and reactive to light. EOM are normal. No scleral icterus.  Cardiovascular: Normal rate and regular rhythm.  No murmur heard. Pulmonary/Chest: Effort normal and breath sounds normal. No respiratory distress. She has no rales. She exhibits no tenderness.  Abdominal: Soft. Bowel sounds are normal. She exhibits no distension. There is no abdominal tenderness.  Splenomegaly  Musculoskeletal:        General: No edema. Normal range of motion.     Cervical back: Normal range of motion and neck supple.  Neurological: She is alert and oriented to person, place, and time. No cranial nerve deficit. She exhibits normal muscle tone. Coordination normal.  Skin: Skin is warm and dry. She is not diaphoretic. No erythema.  Psychiatric: Affect normal.    No visits with results within 3 Day(s) from this visit.  Latest known visit  with results is:  Office Visit on 04/19/2020  Component Date Value Ref Range Status  . T3, Total 04/19/2020 112  76 - 181 ng/dL Final  . T4, Total 04/19/2020 6.0  5.1 - 11.9 mcg/dL Final  . TSH 04/19/2020 0.02* mIU/L Final   Comment:           Reference Range .           > or = 20 Years  0.40-4.50 .                Pregnancy Ranges           First trimester    0.26-2.66           Second trimester   0.55-2.73           Third trimester    0.43-2.91   . Glucose, Bld 04/19/2020 95  65 - 99 mg/dL Final   Comment: .            Fasting reference interval .   . BUN 04/19/2020 10  7 - 25 mg/dL Final  . Creat 04/19/2020 0.38* 0.50 - 1.05 mg/dL Final   Comment: For patients >41 years of age, the reference limit for Creatinine is approximately 13% higher for people identified as African-American. .   . GFR, Est Non African American 04/19/2020 124  > OR = 60 mL/min/1.34m Final  . GFR, Est African  American 04/19/2020 143  > OR = 60 mL/min/1.758mFinal  . BUN/Creatinine Ratio 04/19/2020 26* 6 - 22 (calc) Final  . Sodium 04/19/2020 141  135 - 146 mmol/L Final  . Potassium 04/19/2020 4.0  3.5 - 5.3 mmol/L Final  . Chloride 04/19/2020 110  98 - 110 mmol/L Final   Comment: Verified by repeat analysis. .   . CO2 04/19/2020 23  20 - 32 mmol/L Final  . Calcium 04/19/2020 8.5* 8.6 - 10.4 mg/dL Final  . Total Protein 04/19/2020 6.2  6.1 - 8.1 g/dL Final  . Albumin 04/19/2020 3.3* 3.6 - 5.1 g/dL Final  . Globulin 04/19/2020 2.9  1.9 - 3.7 g/dL (calc) Final  . AG Ratio 04/19/2020 1.1  1.0 - 2.5 (calc) Final  . Total Bilirubin 04/19/2020 1.0  0.2 - 1.2 mg/dL Final  . Alkaline phosphatase (APISO) 04/19/2020 123  37 - 153 U/L Final  . AST 04/19/2020 42* 10 - 35 U/L Final  . ALT 04/19/2020 22  6 - 29 U/L Final  . WBC 04/19/2020 2.5* 3.8 - 10.8 Thousand/uL Final  . RBC 04/19/2020 4.41  3.80 - 5.10 Million/uL Final  . Hemoglobin 04/19/2020 12.7  11.7 - 15.5 g/dL Final  . HCT 04/19/2020 38.0  35.0 - 45.0 % Final  . MCV 04/19/2020 86.2  80.0 - 100.0 fL Final  . MCH 04/19/2020 28.8  27.0 - 33.0 pg Final  . MCHC 04/19/2020 33.4  32.0 - 36.0 g/dL Final  . RDW 04/19/2020 14.2  11.0 - 15.0 % Final  . Platelets 04/19/2020 42* 140 - 400 Thousand/uL Final  . MPV 04/19/2020 11.9  7.5 - 12.5 fL Final  . Neutro Abs 04/19/2020 1,445* 1,500 - 7,800 cells/uL Final  . Lymphs Abs 04/19/2020 768* 850 - 3,900 cells/uL Final  . Absolute Monocytes 04/19/2020 198* 200 - 950 cells/uL Final  . Eosinophils Absolute 04/19/2020 70  15 - 500 cells/uL Final  . Basophils Absolute 04/19/2020 20  0 - 200 cells/uL Final  . Neutrophils Relative % 04/19/2020 57.8  % Final  . Total Lymphocyte  04/19/2020 30.7  % Final  . Monocytes Relative 04/19/2020 7.9  % Final  . Eosinophils Relative 04/19/2020 2.8  % Final  . Basophils Relative 04/19/2020 0.8  % Final  . Smear Review 04/19/2020    Final   Comment: No platelet clumps  seen. Review of peripheral smear confirms automated results.     RADIOGRAPHIC STUDIES: I have personally reviewed the radiological images as listed and agreed with the findings in the report. CT BONE MARROW BIOPSY & ASPIRATION  Result Date: 03/25/2020 INDICATION: 49 year old with thrombocytopenia and leukopenia. In addition, the patient has cirrhosis. Request for CT-guided bone marrow biopsy. EXAM: CT GUIDED BONE MARROW ASPIRATES AND BIOPSY Physician: Stephan Minister. Anselm Pancoast, MD MEDICATIONS: None. ANESTHESIA/SEDATION: Fentanyl 3.0 mcg IV; Versed 100 mg IV Moderate Sedation Time:  15 minutes The patient was continuously monitored during the procedure by the interventional radiology nurse under my direct supervision. COMPLICATIONS: None immediate. PROCEDURE: The procedure was explained to the patient. The risks and benefits of the procedure were discussed and the patient's questions were addressed. Informed consent was obtained from the patient. The patient was placed prone on CT table. Images of the pelvis were obtained. The right side of back was prepped and draped in sterile fashion. The skin and right posterior ilium were anesthetized with 1% lidocaine. 11 gauge bone needle was directed into the right ilium with CT guidance. Two aspirates and two core biopsies were obtained. Bandage placed over the puncture site. IMPRESSION: CT guided bone marrow aspiration and core biopsy. Electronically Signed   By: Markus Daft M.D.   On: 03/25/2020 12:55    Assessment:  Brandi Bright is a 49 y.o. female with history of liver cirrhosis, hepatitis C, splenomegaly, portal hypertension, chronic pancytopenia present for follow-up.  1. Iron deficiency anemia due to chronic blood loss   2. Thrombocytopenia ([place])   3. Copper deficiency   4. Leukopenia, unspecified type    #Iron deficiency anemia, Labs are reviewed and discussed with patient. Hemoglobin is normal.  Hold off additional IV venofers.   # Chronic neutropenia  and thrombocytopenia Bone marrow results was reviewed and discussed with patient. Normocellular bone marrow cellularity with trilineage hematopoiesis.  No morphological evidence of leukemia/lymphoma or high-grade dysplastic. I discussed with the patient that the chronic neutropenia and thrombocytopenia are likely due to chronic cirrhosis/splenomegaly. For future elective procedures, can consider TPO for a week followed by elective procedures.  #Copper deficiency, advised patient to take over-the-counter copper supplementation. # History of Vitamin b12 deficiency.  B12 is adequate.   Follow up 6 months We spent sufficient time to discuss many aspect of care, questions were answered to patient's satisfaction.   Earlie Server, MD, PhD Hematology Oncology [facility] at [facility] Pager- [phone] Bright

## 2020-05-03 ENCOUNTER — Telehealth: Payer: Self-pay | Admitting: Internal Medicine

## 2020-05-03 NOTE — Telephone Encounter (Signed)
Patient states that she talked with you at her apt about having problems with her sinuses. She states that she needs an antibiotic.

## 2020-05-03 NOTE — Telephone Encounter (Signed)
Patient was in the other day said she told dr Lavera Guise she had something going on with her sinus she thought now she needs something prescribed .

## 2020-05-27 ENCOUNTER — Encounter: Payer: Self-pay | Admitting: Internal Medicine

## 2020-05-27 ENCOUNTER — Other Ambulatory Visit: Payer: Self-pay

## 2020-05-27 ENCOUNTER — Ambulatory Visit (INDEPENDENT_AMBULATORY_CARE_PROVIDER_SITE_OTHER): Payer: Medicaid Other | Admitting: Internal Medicine

## 2020-05-27 VITALS — BP 128/74 | HR 68 | Ht 59.0 in | Wt 195.9 lb

## 2020-05-27 DIAGNOSIS — R1312 Dysphagia, oropharyngeal phase: Secondary | ICD-10-CM

## 2020-05-27 DIAGNOSIS — I25118 Atherosclerotic heart disease of native coronary artery with other forms of angina pectoris: Secondary | ICD-10-CM

## 2020-05-27 DIAGNOSIS — H60542 Acute eczematoid otitis externa, left ear: Secondary | ICD-10-CM

## 2020-05-27 DIAGNOSIS — D709 Neutropenia, unspecified: Secondary | ICD-10-CM

## 2020-05-27 DIAGNOSIS — K2930 Chronic superficial gastritis without bleeding: Secondary | ICD-10-CM

## 2020-05-27 DIAGNOSIS — I8500 Esophageal varices without bleeding: Secondary | ICD-10-CM

## 2020-05-27 DIAGNOSIS — D500 Iron deficiency anemia secondary to blood loss (chronic): Secondary | ICD-10-CM

## 2020-05-27 NOTE — Assessment & Plan Note (Signed)
No more bleeding at the present time

## 2020-05-27 NOTE — Assessment & Plan Note (Signed)
Denies any chest pain or arrhythmia.

## 2020-05-27 NOTE — Assessment & Plan Note (Signed)
Intermittent iron infusion, now taking iron pill.

## 2020-05-27 NOTE — Progress Notes (Signed)
Established Patient Office Visit  SUBJECTIVE:  Patient ID: Brandi Bright, female    DOB: 1971/11/09  Age: 49 y.o. MRN: [id number]  CC:  Chief Complaint  Patient presents with  . lab results    patient is here today for follow up for lab results     HPI Brandi Bright presents for earache also complains of tiredness and fatigue.  There is no history of chest pain nausea vomiting or diarrhea no swelling of the legs.  Osteoarthritis is stable.  Occasional intermittent back pain.  Iron deficiency anemia.  Past Medical History:  Diagnosis Date  . Arthritis   . Back pain   . Gout   . Hypothyroidism   . IDA (iron deficiency anemia) 01/16/2020  . MI, old   . Thyroid disease     Past Surgical History:  Procedure Laterality Date  . CARDIAC CATHETERIZATION    . CESAREAN SECTION    . COLONOSCOPY WITH PROPOFOL N/A 03/16/2018   Procedure: COLONOSCOPY WITH PROPOFOL;  Surgeon: Virgel Manifold, MD;  Location: [facility] ENDOSCOPY;  Service: Endoscopy;  Laterality: N/A;  . ESOPHAGOGASTRODUODENOSCOPY N/A 10/11/2019   Procedure: ESOPHAGOGASTRODUODENOSCOPY (EGD);  Surgeon: Lin Landsman, MD;  Location: [facility] ENDOSCOPY;  Service: Gastroenterology;  Laterality: N/A;  . ESOPHAGOGASTRODUODENOSCOPY (EGD) WITH PROPOFOL N/A 10/27/2018   Procedure: ESOPHAGOGASTRODUODENOSCOPY (EGD) WITH PROPOFOL;  Surgeon: Virgel Manifold, MD;  Location: [facility] ENDOSCOPY;  Service: Endoscopy;  Laterality: N/A;  . ESOPHAGOGASTRODUODENOSCOPY (EGD) WITH PROPOFOL N/A 06/25/2019   Procedure: ESOPHAGOGASTRODUODENOSCOPY (EGD) WITH PROPOFOL;  Surgeon: Jonathon Bellows, MD;  Location: [facility] ENDOSCOPY;  Service: Gastroenterology;  Laterality: N/A;  . ESOPHAGOGASTRODUODENOSCOPY (EGD) WITH PROPOFOL N/A 09/12/2019   Procedure: ESOPHAGOGASTRODUODENOSCOPY (EGD) WITH PROPOFOL;  Surgeon: Lucilla Lame, MD;  Location: [facility] ENDOSCOPY;  Service: Endoscopy;  Laterality: N/A;  . ESOPHAGOGASTRODUODENOSCOPY (EGD) WITH PROPOFOL N/A 11/20/2019    Procedure: ESOPHAGOGASTRODUODENOSCOPY (EGD) WITH PROPOFOL;  Surgeon: Virgel Manifold, MD;  Location: [facility] ENDOSCOPY;  Service: Endoscopy;  Laterality: N/A;  . ESOPHAGOGASTRODUODENOSCOPY (EGD) WITH PROPOFOL N/A 11/23/2019   Procedure: ESOPHAGOGASTRODUODENOSCOPY (EGD) WITH PROPOFOL;  Surgeon: Virgel Manifold, MD;  Location: [facility] ENDOSCOPY;  Service: Endoscopy;  Laterality: N/A;  . ESOPHAGOGASTRODUODENOSCOPY (EGD) WITH PROPOFOL N/A 01/03/2020   Procedure: ESOPHAGOGASTRODUODENOSCOPY (EGD) WITH PROPOFOL;  Surgeon: Lin Landsman, MD;  Location: [facility] ENDOSCOPY;  Service: Endoscopy;  Laterality: N/A;  . TUBAL LIGATION      Family History  Problem Relation Age of Onset  . Hypertension Other   . CAD Father   . Colon cancer Father   . Lung cancer Father   . Pancreatic cancer Brother     Social History   Socioeconomic History  . Marital status: Single    Spouse name: Not on file  . Number of children: Not on file  . Years of education: Not on file  . Highest education level: Not on file  Occupational History  . Not on file  Tobacco Use  . Smoking status: Never Smoker  . Smokeless tobacco: Never Used  Vaping Use  . Vaping Use: Never used  Substance and Sexual Activity  . Alcohol use: Yes    Comment: rarely  . Drug use: Never  . Sexual activity: Not Currently  Other Topics Concern  . Not on file  Social History Narrative  . Not on file   Social Determinants of Health   Financial Resource Strain: Medium Risk  . Difficulty of Paying Living Expenses: Somewhat hard  Food Insecurity: No Food Insecurity  . Worried About  Running Out of Food in the Last Year: Never true  . Ran Out of Food in the Last Year: Never true  Transportation Needs: No Transportation Needs  . Lack of Transportation (Medical): No  . Lack of Transportation (Non-Medical): No  Physical Activity: Inactive  . Days of Exercise per Week: 0 days  . Minutes of Exercise per Session: 0 min  Stress: Stress  Concern Present  . Feeling of Stress : Rather much  Social Connections: Unknown  . Frequency of Communication with Friends and Family: More than three times a week  . Frequency of Social Gatherings with Friends and Family: More than three times a week  . Attends Religious Services: Never  . Active Member of Clubs or Organizations: No  . Attends Archivist Meetings: Not asked  . Marital Status: Not on file  Intimate Partner Violence: Not At Risk  . Fear of Current or Ex-Partner: No  . Emotionally Abused: No  . Physically Abused: No  . Sexually Abused: No     Current Outpatient Medications:  .  aspirin EC 81 MG EC tablet, Take 1 tablet (81 mg total) by mouth daily., Disp: 90 tablet, Rfl: 0 .  cyanocobalamin ([employer] VITAMIN B12) 1000 MCG tablet, Take 1 tablet (1,000 mcg total) by mouth daily., Disp: 90 tablet, Rfl: 1 .  diclofenac Sodium (VOLTAREN) 1 % GEL, Apply 5 g topically 4 (four) times daily., Disp: , Rfl:  .  ferrous sulfate 325 (65 FE) MG EC tablet, TAKE 1 TABLET BY MOUTH EVERY DAY WITH BREAKFAST, Disp: 30 tablet, Rfl: 2 .  lactulose (CHRONULAC) 10 GM/15ML solution, TAKE 15 MLS (10 G TOTAL) BY MOUTH DAILY FOR 30 DAYS., Disp: 473 mL, Rfl: 3 .  levothyroxine (SYNTHROID) 100 MCG tablet, Take 1 tablet (100 mcg total) by mouth daily before breakfast., Disp: 30 tablet, Rfl: 4 .  nitroGLYCERIN (NITROLINGUAL) 0.4 MG/SPRAY spray, Place 1 spray under the tongue every 5 (five) minutes x 3 doses as needed for chest pain., Disp: 12 g, Rfl: 2 .  oxyCODONE-acetaminophen (PERCOCET/ROXICET) 5-325 MG tablet, Take 1 tablet by mouth 4 (four) times daily. , Disp: , Rfl:  .  pantoprazole (PROTONIX) 40 MG tablet, TAKE 1 TABLET BY MOUTH TWICE A DAY, Disp: 60 tablet, Rfl: 1   Allergies  Allergen Reactions  . Vicodin [Hydrocodone-Acetaminophen] Rash  . Amoxicillin Rash and Other (See Comments)    Has patient had a PCN reaction causing immediate rash, facial/tongue/throat swelling, SOB or  lightheadedness with hypotension: Yes Has patient had a PCN reaction causing severe rash involving mucus membranes or skin necrosis: No Has patient had a PCN reaction that required hospitalization: No Has patient had a PCN reaction occurring within the last 10 years: No If all of the above answers are "NO", then may proceed with Cephalosporin use.   . Gabapentin Rash    ROS Review of Systems   Review of Systems  Constitutional: Negative for chills, diaphoresis, fever and malaise/fatigue.  HENT: Negative for congestion,   C/o lt ear pain ear pain and sore throat.   Respiratory: Negative for cough, shortness of breath, wheezing Cardiovascular: Negative for chest pain, palpitations, peripheral edema, orthopnea, PND Gastrointestinal: Negative for abdominal pain, constipation, diarrhea, nausea and vomiting.  Genitourinary: Negative for dysuria, incontinence, hematuria  Musculoskeletal: + for myalgias. Negative for arthralgias. Skin: Negative for rashes or pruritus Neurological: Negative for dizziness, syncope, headaches, focal weakness, altered sensation  Psychiatric/Behavioral: Negative for depression and anxiety.        OBJECTIVE:  Constitutional: The patient is oriented to person, place, and time. Pt appears well-developed and well-nourished.  Head: Normocephalic and atraumatic. lt ear externa otitis  noted Eyes: Pupils are equal, round, and reactive to light.  Neck: No JVD present. No tracheal deviation present. No thyromegaly present.  Cardiovascular: Regular rate and rhythm. No gallop. Pulmonary/Chest: Normal breath sounds. Lungs clear to auscultation. Abdominal: No abdominal tenderness. No guarding or rebound tenderness. No hepatosplenomegaly. Musculoskeletal: Normal range of motion.  Lymphatic: No cervical adenopathy.  Neurological: No cranial nerve deficit.  Skin: Skin is warm and hydrated.  Psychiatric: The patient has a normal mood and affect.   BP 128/74   Pulse 68    Ht 4\' 11"  (1.499 m)   Wt 195 lb 14.4 oz (88.9 kg)   LMP  (LMP Unknown) Comment: last menstrual 9 years ago, tubal ligation  BMI 39.57 kg/m  Wt Readings from Last 3 Encounters:  05/27/20 195 lb 14.4 oz (88.9 kg)  04/25/20 201 lb 9.6 oz (91.4 kg)  04/24/20 201 lb 6.4 oz (91.4 kg)    Health Maintenance Due  Topic Date Due  . COVID-19 Vaccine (1) Never done  . TETANUS/TDAP  Never done  . PAP SMEAR-Modifier  Never done  . MAMMOGRAM  Never done    There are no preventive care reminders to display for this patient.  CBC Latest Ref Rng & Units 04/19/2020 04/08/2020 03/25/2020  WBC 3.8 - 10.8 Thousand/uL 2.5(L) 2.2(L) 2.0(L)  Hemoglobin 11.7 - 15.5 g/dL 12.7 11.8(L) 11.4(L)  Hematocrit 35 - 45 % 38.0 35.1(L) 34.0(L)  Platelets 140 - 400 Thousand/uL 42(L) 38(L) 39(L)   CMP Latest Ref Rng & Units 04/19/2020 04/08/2020 01/10/2020  Glucose 65 - 99 mg/dL 95 119(H) 111(H)  BUN 7 - 25 mg/dL 10 10 8   Creatinine 0.50 - 1.05 mg/dL 0.38(L) 0.47 0.48  Sodium 135 - 146 mmol/L 141 140 140  Potassium 3.5 - 5.3 mmol/L 4.0 3.8 3.6  Chloride 98 - 110 mmol/L 110 110 109  CO2 20 - 32 mmol/L 23 24 27   Calcium 8.6 - 10.4 mg/dL 8.5(L) 8.7(L) 8.8(L)  Total Protein 6.1 - 8.1 g/dL 6.2 6.5 6.6  Total Bilirubin 0.2 - 1.2 mg/dL 1.0 1.2 0.9  Alkaline Phos 38 - 126 U/L - 115 91  AST 10 - 35 U/L 42(H) 41 39  ALT 6 - 29 U/L 22 24 19     Lab Results  Component Value Date   TSH 0.02 (L) 04/19/2020   Lab Results  Component Value Date   ALBUMIN 3.1 (L) 04/08/2020   ANIONGAP 6 04/08/2020   Lab Results  Component Value Date   CHOL 107 12/29/2017   CHOL 109 08/25/2013   CHOL 84 07/03/2012   HDL 21 (L) 08/25/2013   HDL 18 (L) 07/03/2012   HDL 24 (L) 04/08/2012   LDLCALC 67 08/25/2013   LDLCALC 40 07/03/2012   LDLCALC 56 04/08/2012   Lab Results  Component Value Date   TRIG 107 08/25/2013   Lab Results  Component Value Date   HGBA1C 4.9 12/29/2017      ASSESSMENT & PLAN:   Problem List Items  Addressed This Visit      Cardiovascular and Mediastinum   Esophageal varices without bleeding (HCC)    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        Digestive   Dysphagia -  Primary   Gastritis without bleeding    No more bleeding at the present time        Other   Iron deficiency anemia    Intermittent iron infusion, now taking iron pill.      Neutropenia [facility])    Patient is being followed up in the cancer center.       Other Visit Diagnoses    Acute eczematoid otitis externa of left ear          No orders of the defined types were placed in this encounter.  1. Oropharyngeal dysphagia no Problem right now  2. Superficial gastritis without hemorrhage, unspecified chronicity The present time stable  3. Iron deficiency anemia due to chronic blood loss Follow hemoglobin and hematocrit  4. Neutropenia, unspecified type [facility]) To oncology  5. Acute eczematoid otitis externa of left ear Advised to use hydrocortisone cream locally once or twice a day  6. Esophageal varices without bleeding, unspecified esophageal varices type (HCC) stable at the present time  7. Coronary artery disease of native heart with stable angina pectoris, unspecified vessel or lesion type ([place]) No angina at the present time Follow-up: No follow-ups on file.    Dr. Jane Canary [facility] [address] [place], [place] [address]   By signing my name below, I, [employer], attest that this documentation has been prepared under the direction of Cletis Athens, MD. Electronically Signed: Cletis Athens, MD 05/27/20, 12:31 PM   I personally performed the services described in this documentation, which was SCRIBED in my presence. The recorded information has been reviewed and considered accurate. It has been edited as necessary during review. Cletis Athens,  MD

## 2020-05-27 NOTE — Assessment & Plan Note (Signed)
Patient is being followed up in the cancer center.

## 2020-05-27 NOTE — Assessment & Plan Note (Signed)
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

## 2020-05-30 ENCOUNTER — Other Ambulatory Visit: Payer: Self-pay

## 2020-05-30 DIAGNOSIS — I252 Old myocardial infarction: Secondary | ICD-10-CM | POA: Insufficient documentation

## 2020-05-30 DIAGNOSIS — K625 Hemorrhage of anus and rectum: Secondary | ICD-10-CM | POA: Insufficient documentation

## 2020-05-30 DIAGNOSIS — E039 Hypothyroidism, unspecified: Secondary | ICD-10-CM | POA: Diagnosis not present

## 2020-05-30 DIAGNOSIS — I10 Essential (primary) hypertension: Secondary | ICD-10-CM | POA: Diagnosis not present

## 2020-05-30 DIAGNOSIS — Z79899 Other long term (current) drug therapy: Secondary | ICD-10-CM | POA: Diagnosis not present

## 2020-05-30 DIAGNOSIS — Z7982 Long term (current) use of aspirin: Secondary | ICD-10-CM | POA: Insufficient documentation

## 2020-05-30 LAB — COMPREHENSIVE METABOLIC PANEL
ALT: 28 U/L (ref 0–44)
AST: 40 U/L (ref 15–41)
Albumin: 3.5 g/dL (ref 3.5–5.0)
Alkaline Phosphatase: 119 U/L (ref 38–126)
Anion gap: 6 (ref 5–15)
BUN: 10 mg/dL (ref 6–20)
CO2: 24 mmol/L (ref 22–32)
Calcium: 8.8 mg/dL — ABNORMAL LOW (ref 8.9–10.3)
Chloride: 111 mmol/L (ref 98–111)
Creatinine, Ser: 0.56 mg/dL (ref 0.44–1.00)
GFR calc Af Amer: >60 mL/min (ref >60)
GFR calc non Af Amer: >60 mL/min (ref >60)
Glucose, Bld: 110 mg/dL — ABNORMAL HIGH (ref 70–99)
Potassium: 3.6 mmol/L (ref 3.5–5.1)
Sodium: 141 mmol/L (ref 135–145)
Total Bilirubin: 1.6 mg/dL — ABNORMAL HIGH (ref 0.3–1.2)
Total Protein: 6.9 g/dL (ref 6.5–8.1)

## 2020-05-30 LAB — URINALYSIS, COMPLETE (UACMP) WITH MICROSCOPIC
Bacteria, UA: NONE SEEN
Bilirubin Urine: NEGATIVE
Glucose, UA: NEGATIVE mg/dL
Ketones, ur: NEGATIVE mg/dL
Nitrite: NEGATIVE
Protein, ur: 30 mg/dL — AB
Specific Gravity, Urine: 1.029 (ref 1.005–1.030)
pH: 5 (ref 5.0–8.0)

## 2020-05-30 LAB — CBC
HCT: 37.9 % (ref 36.0–46.0)
Hemoglobin: 13.1 g/dL (ref 12.0–15.0)
MCH: 30.4 pg (ref 26.0–34.0)
MCHC: 34.6 g/dL (ref 30.0–36.0)
MCV: 87.9 fL (ref 80.0–100.0)
Platelets: 51 10*3/uL — ABNORMAL LOW (ref 150–400)
RBC: 4.31 MIL/uL (ref 3.87–5.11)
RDW: 14.1 % (ref 11.5–15.5)
WBC: 3.6 10*3/uL — ABNORMAL LOW (ref 4.0–10.5)
nRBC: 0 % (ref 0.0–0.2)

## 2020-05-30 LAB — LIPASE, BLOOD: Lipase: 61 U/L — ABNORMAL HIGH (ref 11–51)

## 2020-05-30 NOTE — ED Triage Notes (Signed)
Pt fell backwards landing on the rim of the toilet and states since then has been having bleeding. Not sure if it is vaginal or rectal bleeding. Pt co lower back pain, and right upper quad pain.

## 2020-05-31 ENCOUNTER — Emergency Department
Admission: EM | Admit: 2020-05-31 | Discharge: 2020-05-31 | Disposition: A | Discharge status: Home / Self Care | Payer: Medicaid Other | Attending: Emergency Medicine | Admitting: Emergency Medicine

## 2020-05-31 DIAGNOSIS — K625 Hemorrhage of anus and rectum: Secondary | ICD-10-CM | POA: Diagnosis not present

## 2020-05-31 MED ORDER — HYDROCORTISONE ACETATE 25 MG RE SUPP: 25.0000 mg | Freq: Two times a day (BID) | RECTAL | 1 refills | Status: AC

## 2020-05-31 MED ORDER — DOCUSATE SODIUM 100 MG PO CAPS: 100.0000 mg | ORAL_CAPSULE | Freq: Two times a day (BID) | ORAL | 2 refills | Status: AC

## 2020-05-31 NOTE — ED Provider Notes (Signed)
[facility] Emergency Department Provider Note   ____________________________________________   First MD Initiated Contact with Patient 05/31/20 1042     (approximate)  I have reviewed the triage vital signs and the nursing notes.   HISTORY  Chief Complaint Rectal Bleeding    HPI Brandi Bright is a 49 y.o. female with past medical history of CAD, hypothyroidism, chronic back pain, cirrhosis, and esophageal varices who presents to the ED complaining of rectal bleeding.  Patient reports that she was leaning over in front of her toilet when she lost her balance and fell backwards, striking her buttocks on the room of the toilet.  She states she fell so that "one cheek was on either side" of the toilet.  Since then, she has noticed bleeding from her rectal area along with some rectal discomfort.  She states she is not sure whether she cut or scratch something, has not noticed any vaginal bleeding.  She initially had some pain in the right upper quadrant of her abdomen, but now states this is resolved.  She has not had any fevers, vomiting, chest pain, cough, or shortness of breath.  Bleeding now seems to be easing up and patient reports having a bowel movement that was streaked with blood but did not contain any blood itself.        Past Medical History:  Diagnosis Date  . Arthritis   . Back pain   . Gout   . Hypothyroidism   . IDA (iron deficiency anemia) 01/16/2020  . MI, old   . Thyroid disease     Patient Active Problem List   Diagnosis Date Noted  . Angina of effort ([place]) 04/24/2020  . Neutropenia ([place]) 04/24/2020  . IDA (iron deficiency anemia) 01/16/2020  . Elevated troponin 01/10/2020  . Portal hypertension ([place])   . GI bleeding 10/11/2019  . Dysphagia   . Gastritis without bleeding   . GI bleed 06/24/2019  . Iron deficiency anemia 10/31/2018  . Low vitamin B12 level 10/31/2018  . Low serum copper for age 05/31/2018  . Positive ANA  (antinuclear antibody) 10/31/2018  . Other cirrhosis of liver ([place])   . Esophageal varices without bleeding ([place])   . Stomach irritation   . Gastric polyp   . Normocytic anemia 09/23/2018  . Thrombocytopenia ([place]) 09/23/2018  . Leukopenia 09/23/2018  . Splenomegaly 09/23/2018  . Colon cancer screening   . Benign neoplasm of descending colon   . Benign neoplasm of ascending colon   . Family history of malignant neoplasm of gastrointestinal tract   . Benign neoplasm of transverse colon   . Polyp of sigmoid colon   . Chest wall pain 12/29/2017  . Anxiety and depression 04/30/2014  . Chronic pain with drug dependence ([place]) 04/30/2014  . Coronary artery disease 04/30/2014  . Degenerative disc disease 04/30/2014  . Hyperlipidemia 04/30/2014  . Hypertension 04/30/2014  . Hypothyroid 04/30/2014    Past Surgical History:  Procedure Laterality Date  . CARDIAC CATHETERIZATION    . CESAREAN SECTION    . COLONOSCOPY WITH PROPOFOL N/A 03/16/2018   Procedure: COLONOSCOPY WITH PROPOFOL;  Surgeon: Virgel Manifold, MD;  Location: [facility] ENDOSCOPY;  Service: Endoscopy;  Laterality: N/A;  . ESOPHAGOGASTRODUODENOSCOPY N/A 10/11/2019   Procedure: ESOPHAGOGASTRODUODENOSCOPY (EGD);  Surgeon: Lin Landsman, MD;  Location: [facility] ENDOSCOPY;  Service: Gastroenterology;  Laterality: N/A;  . ESOPHAGOGASTRODUODENOSCOPY (EGD) WITH PROPOFOL N/A 10/27/2018   Procedure: ESOPHAGOGASTRODUODENOSCOPY (EGD) WITH PROPOFOL;  Surgeon: Virgel Manifold, MD;  Location:  [facility] ENDOSCOPY;  Service: Endoscopy;  Laterality: N/A;  . ESOPHAGOGASTRODUODENOSCOPY (EGD) WITH PROPOFOL N/A 06/25/2019   Procedure: ESOPHAGOGASTRODUODENOSCOPY (EGD) WITH PROPOFOL;  Surgeon: Jonathon Bellows, MD;  Location: [facility] ENDOSCOPY;  Service: Gastroenterology;  Laterality: N/A;  . ESOPHAGOGASTRODUODENOSCOPY (EGD) WITH PROPOFOL N/A 09/12/2019   Procedure: ESOPHAGOGASTRODUODENOSCOPY (EGD) WITH PROPOFOL;  Surgeon: Lucilla Lame, MD;  Location: [facility]  ENDOSCOPY;  Service: Endoscopy;  Laterality: N/A;  . ESOPHAGOGASTRODUODENOSCOPY (EGD) WITH PROPOFOL N/A 11/20/2019   Procedure: ESOPHAGOGASTRODUODENOSCOPY (EGD) WITH PROPOFOL;  Surgeon: Virgel Manifold, MD;  Location: [facility] ENDOSCOPY;  Service: Endoscopy;  Laterality: N/A;  . ESOPHAGOGASTRODUODENOSCOPY (EGD) WITH PROPOFOL N/A 11/23/2019   Procedure: ESOPHAGOGASTRODUODENOSCOPY (EGD) WITH PROPOFOL;  Surgeon: Virgel Manifold, MD;  Location: [facility] ENDOSCOPY;  Service: Endoscopy;  Laterality: N/A;  . ESOPHAGOGASTRODUODENOSCOPY (EGD) WITH PROPOFOL N/A 01/03/2020   Procedure: ESOPHAGOGASTRODUODENOSCOPY (EGD) WITH PROPOFOL;  Surgeon: Lin Landsman, MD;  Location: [facility] ENDOSCOPY;  Service: Endoscopy;  Laterality: N/A;  . TUBAL LIGATION      Prior to Admission medications   Medication Sig Start Date End Date Taking? Authorizing Provider  aspirin EC 81 MG EC tablet Take 1 tablet (81 mg total) by mouth daily. 01/11/20   Lorella Nimrod, MD  cyanocobalamin ([employer] VITAMIN B12) 1000 MCG tablet Take 1 tablet (1,000 mcg total) by mouth daily. 03/15/20   Earlie Server, MD  diclofenac Sodium (VOLTAREN) 1 % GEL Apply 5 g topically 4 (four) times daily. 01/01/20   [provider]  docusate sodium (COLACE) 100 MG capsule Take 1 capsule (100 mg total) by mouth 2 (two) times daily. 05/31/20 05/31/21  Blake Divine, MD  ferrous sulfate 325 (65 FE) MG EC tablet TAKE 1 TABLET BY MOUTH EVERY DAY WITH BREAKFAST 04/08/20   Earlie Server, MD  hydrocortisone (ANUSOL-HC) 25 MG suppository Place 1 suppository (25 mg total) rectally every 12 (twelve) hours. 05/31/20 05/31/21  Blake Divine, MD  lactulose (CHRONULAC) 10 GM/15ML solution TAKE 15 MLS (10 G TOTAL) BY MOUTH DAILY FOR 30 DAYS. 08/24/19   Virgel Manifold, MD  levothyroxine (SYNTHROID) 100 MCG tablet Take 1 tablet (100 mcg total) by mouth daily before breakfast. 04/24/20   Cletis Athens, MD  nitroGLYCERIN (NITROLINGUAL) 0.4 MG/SPRAY spray Place 1 spray under the  tongue every 5 (five) minutes x 3 doses as needed for chest pain. 04/24/20   Cletis Athens, MD  oxyCODONE-acetaminophen (PERCOCET/ROXICET) 5-325 MG tablet Take 1 tablet by mouth 4 (four) times daily.  01/01/20   [provider]  pantoprazole (PROTONIX) 40 MG tablet TAKE 1 TABLET BY MOUTH TWICE A DAY 01/31/20   [name] B, MD    Allergies Vicodin [hydrocodone-acetaminophen], Amoxicillin, and Gabapentin  Family History  Problem Relation Age of Onset  . Hypertension Other   . CAD Father   . Colon cancer Father   . Lung cancer Father   . Pancreatic cancer Brother     Social History Social History   Tobacco Use  . Smoking status: Never Smoker  . Smokeless tobacco: Never Used  Vaping Use  . Vaping Use: Never used  Substance Use Topics  . Alcohol use: Yes    Comment: rarely  . Drug use: Never    Review of Systems  Constitutional: No fever/chills Eyes: No visual changes. ENT: No sore throat. Cardiovascular: Denies chest pain. Respiratory: Denies shortness of breath. Gastrointestinal: No abdominal pain.  No nausea, no vomiting.  No diarrhea.  No constipation.  Positive for rectal bleeding. Genitourinary: Negative for dysuria. Musculoskeletal: Negative for back pain. Skin:  Negative for rash. Neurological: Negative for headaches, focal weakness or numbness.  ____________________________________________   PHYSICAL EXAM:  VITAL SIGNS: ED Triage Vitals  Enc Vitals Group     BP 05/30/20 2309 135/81     Pulse Rate 05/30/20 2309 93     Resp 05/30/20 2309 18     Temp 05/30/20 2309 98.6 F (37 C)     Temp Source 05/30/20 2309 Oral     SpO2 05/30/20 2309 96 %     Weight 05/30/20 2315 195 lb (88.5 kg)     Height 05/30/20 2315 4\' 11"  (1.499 m)     Head Circumference --      Peak Flow --      Pain Score 05/30/20 2314 7     Pain Loc --      Pain Edu? --      Excl. in [place]? --     Constitutional: Alert and oriented. Eyes: Conjunctivae are normal. Head:  Atraumatic. Nose: No congestion/rhinnorhea. Mouth/Throat: Mucous membranes are moist. Neck: Normal ROM Cardiovascular: Normal rate, regular rhythm. Grossly normal heart sounds. Respiratory: Normal respiratory effort.  No retractions. Lungs CTAB. Gastrointestinal: Soft and nontender. No distention.  Hemorrhoids noted with dried blood but no active bleeding. Genitourinary: deferred Musculoskeletal: No lower extremity tenderness nor edema. Neurologic:  Normal speech and language. No gross focal neurologic deficits are appreciated. Skin:  Skin is warm, dry and intact. No rash noted. Psychiatric: Mood and affect are normal. Speech and behavior are normal.  ____________________________________________   LABS (all labs ordered are listed, but only abnormal results are displayed)  Labs Reviewed  CBC - Abnormal; Notable for the following components:      Result Value   WBC 3.6 (*)    Platelets 51 (*)    All other components within normal limits  COMPREHENSIVE METABOLIC PANEL - Abnormal; Notable for the following components:   Glucose, Bld 110 (*)    Calcium 8.8 (*)    Total Bilirubin 1.6 (*)    All other components within normal limits  LIPASE, BLOOD - Abnormal; Notable for the following components:   Lipase 61 (*)    All other components within normal limits  URINALYSIS, COMPLETE (UACMP) WITH MICROSCOPIC - Abnormal; Notable for the following components:   Color, Urine AMBER (*)    APPearance HAZY (*)    Hgb urine dipstick LARGE (*)    Protein, ur 30 (*)    Leukocytes,Ua TRACE (*)    All other components within normal limits    PROCEDURES  Procedure(s) performed (including Critical Care):  Procedures   ____________________________________________   INITIAL IMPRESSION / ASSESSMENT AND PLAN / ED COURSE       49 year old female with past medical history of CAD, hypothyroidism, chronic back pain, cirrhosis, and esophageal varices presents to the ED complaining of rectal  bleeding.  Patient initially noticed bleeding from her rectal area but this has gradually improved since onset.  Her H&H is stable and exam shows evidence of what was a bleeding hemorrhoid that has now resolved.  While patient has a history of cirrhosis and esophageal varices, I doubt significant upper GI bleed as she has had no melena or hematemesis.  We will start patient on Anusol as well as Colace, she was counseled to follow-up with her PCP and otherwise return to the ED for new or worsening symptoms, patient agrees with plan.      ____________________________________________   FINAL CLINICAL IMPRESSION(S) / ED DIAGNOSES  Final diagnoses:  Rectal bleeding  ED Discharge Orders         Ordered    hydrocortisone (ANUSOL-HC) 25 MG suppository  Every 12 hours     Discontinue  Reprint     05/31/20 1241    docusate sodium (COLACE) 100 MG capsule  2 times daily     Discontinue  Reprint     05/31/20 1241           Note:  This document was prepared using Dragon voice recognition software and may include unintentional dictation errors.   Blake Divine, MD 05/31/20 1555

## 2020-05-31 NOTE — ED Notes (Signed)
Pt fell getting up from toilet last night and had bleeding from her rectum. Pt states she has hx of bleeding from rectum w/ straining.

## 2020-06-07 ENCOUNTER — Other Ambulatory Visit: Payer: Self-pay

## 2020-06-07 ENCOUNTER — Ambulatory Visit (INDEPENDENT_AMBULATORY_CARE_PROVIDER_SITE_OTHER): Payer: Medicaid Other | Admitting: Internal Medicine

## 2020-06-07 ENCOUNTER — Encounter: Payer: Self-pay | Admitting: Internal Medicine

## 2020-06-07 VITALS — BP 133/87 | HR 72 | Ht <= 58 in | Wt 194.7 lb

## 2020-06-07 DIAGNOSIS — K7469 Other cirrhosis of liver: Secondary | ICD-10-CM

## 2020-06-07 DIAGNOSIS — E669 Obesity, unspecified: Secondary | ICD-10-CM | POA: Insufficient documentation

## 2020-06-07 DIAGNOSIS — H60503 Unspecified acute noninfective otitis externa, bilateral: Secondary | ICD-10-CM | POA: Insufficient documentation

## 2020-06-07 DIAGNOSIS — I10 Essential (primary) hypertension: Secondary | ICD-10-CM

## 2020-06-07 MED ORDER — OFLOXACIN 0.3 % OT SOLN: 5.0000 [drp] | Freq: Every day | OTIC | 1 refills | Status: AC

## 2020-06-07 NOTE — Assessment & Plan Note (Signed)
Patient do not have any GI bleed no ascites was noted on physical examination of the abdomen.  There is no swelling of the legs.  She was advised to follow-up with GI specialist

## 2020-06-07 NOTE — Assessment & Plan Note (Signed)
Patient was started on ofloxacin  she was found to have otitis external of the left ear and right ear  Throat is not congested she was also found to be tender in the left submandibular region  No lymph nodes were palpable.

## 2020-06-07 NOTE — Assessment & Plan Note (Addendum)
-   I encouraged the patient to lose weight.  - I educated them on making healthy dietary choices including eating more fruits and vegetables and less fried foods. - I encouraged the patient to exercise more, and educated on the benefits of exercise including weight loss, diabetes prevention, and hypertension prevention.   

## 2020-06-07 NOTE — Assessment & Plan Note (Signed)
-   Today, the patient's blood pressure is well managed on  Current treatment - The patient will not change the current treatment regimen.  - I encouraged the patient to eat a low-sodium diet to help control blood pressure. - I encouraged the patient to live an active lifestyle and complete activities that increases heart rate to 85% target heart rate at least 5 times per week for one hour.

## 2020-06-07 NOTE — Progress Notes (Signed)
Established Patient Office Visit  SUBJECTIVE:  Subjective  Patient ID: Brandi Bright, female    DOB: 15-May-1971  Age: 49 y.o. MRN: [id number]  CC:  Chief Complaint  Patient presents with  . Facial Pain    Pt complains of left side facial pain, pt states it is worse at night. The pain is located on the entire left side of her face including her left ear and neck    HPI Brandi Bright is a 49 y.o. female presenting today for a recheck on her bilateral ears and newly developed left sided facial pain.   She has not gotten her COVID-19 shot nd she is not interested in getting it.    Past Medical History:  Diagnosis Date  . Arthritis   . Back pain   . Gout   . Hypothyroidism   . IDA (iron deficiency anemia) 01/16/2020  . MI, old   . Thyroid disease     Past Surgical History:  Procedure Laterality Date  . CARDIAC CATHETERIZATION    . CESAREAN SECTION    . COLONOSCOPY WITH PROPOFOL N/A 03/16/2018   Procedure: COLONOSCOPY WITH PROPOFOL;  Surgeon: Virgel Manifold, MD;  Location: [facility] ENDOSCOPY;  Service: Endoscopy;  Laterality: N/A;  . ESOPHAGOGASTRODUODENOSCOPY N/A 10/11/2019   Procedure: ESOPHAGOGASTRODUODENOSCOPY (EGD);  Surgeon: Lin Landsman, MD;  Location: [facility] ENDOSCOPY;  Service: Gastroenterology;  Laterality: N/A;  . ESOPHAGOGASTRODUODENOSCOPY (EGD) WITH PROPOFOL N/A 10/27/2018   Procedure: ESOPHAGOGASTRODUODENOSCOPY (EGD) WITH PROPOFOL;  Surgeon: Virgel Manifold, MD;  Location: [facility] ENDOSCOPY;  Service: Endoscopy;  Laterality: N/A;  . ESOPHAGOGASTRODUODENOSCOPY (EGD) WITH PROPOFOL N/A 06/25/2019   Procedure: ESOPHAGOGASTRODUODENOSCOPY (EGD) WITH PROPOFOL;  Surgeon: Jonathon Bellows, MD;  Location: [facility] ENDOSCOPY;  Service: Gastroenterology;  Laterality: N/A;  . ESOPHAGOGASTRODUODENOSCOPY (EGD) WITH PROPOFOL N/A 09/12/2019   Procedure: ESOPHAGOGASTRODUODENOSCOPY (EGD) WITH PROPOFOL;  Surgeon: Lucilla Lame, MD;  Location: [facility] ENDOSCOPY;  Service: Endoscopy;   Laterality: N/A;  . ESOPHAGOGASTRODUODENOSCOPY (EGD) WITH PROPOFOL N/A 11/20/2019   Procedure: ESOPHAGOGASTRODUODENOSCOPY (EGD) WITH PROPOFOL;  Surgeon: Virgel Manifold, MD;  Location: [facility] ENDOSCOPY;  Service: Endoscopy;  Laterality: N/A;  . ESOPHAGOGASTRODUODENOSCOPY (EGD) WITH PROPOFOL N/A 11/23/2019   Procedure: ESOPHAGOGASTRODUODENOSCOPY (EGD) WITH PROPOFOL;  Surgeon: Virgel Manifold, MD;  Location: [facility] ENDOSCOPY;  Service: Endoscopy;  Laterality: N/A;  . ESOPHAGOGASTRODUODENOSCOPY (EGD) WITH PROPOFOL N/A 01/03/2020   Procedure: ESOPHAGOGASTRODUODENOSCOPY (EGD) WITH PROPOFOL;  Surgeon: Lin Landsman, MD;  Location: [facility] ENDOSCOPY;  Service: Endoscopy;  Laterality: N/A;  . TUBAL LIGATION      Family History  Problem Relation Age of Onset  . Hypertension Other   . CAD Father   . Colon cancer Father   . Lung cancer Father   . Pancreatic cancer Brother     Social History   Socioeconomic History  . Marital status: Single    Spouse name: Not on file  . Number of children: Not on file  . Years of education: Not on file  . Highest education level: Not on file  Occupational History  . Not on file  Tobacco Use  . Smoking status: Never Smoker  . Smokeless tobacco: Never Used  Vaping Use  . Vaping Use: Never used  Substance and Sexual Activity  . Alcohol use: Yes    Comment: rarely  . Drug use: Never  . Sexual activity: Not Currently  Other Topics Concern  . Not on file  Social History Narrative  . Not on file   Social Determinants of Health  Financial Resource Strain: Medium Risk  . Difficulty of Paying Living Expenses: Somewhat hard  Food Insecurity: No Food Insecurity  . Worried About Charity fundraiser in the Last Year: Never true  . Ran Out of Food in the Last Year: Never true  Transportation Needs: No Transportation Needs  . Lack of Transportation (Medical): No  . Lack of Transportation (Non-Medical): No  Physical Activity: Inactive  . Days of  Exercise per Week: 0 days  . Minutes of Exercise per Session: 0 min  Stress: Stress Concern Present  . Feeling of Stress : Rather much  Social Connections: Unknown  . Frequency of Communication with Friends and Family: More than three times a week  . Frequency of Social Gatherings with Friends and Family: More than three times a week  . Attends Religious Services: Never  . Active Member of Clubs or Organizations: No  . Attends Archivist Meetings: Not asked  . Marital Status: Not on file  Intimate Partner Violence: Not At Risk  . Fear of Current or Ex-Partner: No  . Emotionally Abused: No  . Physically Abused: No  . Sexually Abused: No     Current Outpatient Medications:  .  aspirin EC 81 MG EC tablet, Take 1 tablet (81 mg total) by mouth daily., Disp: 90 tablet, Rfl: 0 .  cyanocobalamin ([employer] VITAMIN B12) 1000 MCG tablet, Take 1 tablet (1,000 mcg total) by mouth daily., Disp: 90 tablet, Rfl: 1 .  diclofenac Sodium (VOLTAREN) 1 % GEL, Apply 5 g topically 4 (four) times daily., Disp: , Rfl:  .  docusate sodium (COLACE) 100 MG capsule, Take 1 capsule (100 mg total) by mouth 2 (two) times daily., Disp: 60 capsule, Rfl: 2 .  ferrous sulfate 325 (65 FE) MG EC tablet, TAKE 1 TABLET BY MOUTH EVERY DAY WITH BREAKFAST, Disp: 30 tablet, Rfl: 2 .  hydrocortisone (ANUSOL-HC) 25 MG suppository, Place 1 suppository (25 mg total) rectally every 12 (twelve) hours., Disp: 12 suppository, Rfl: 1 .  lactulose (CHRONULAC) 10 GM/15ML solution, TAKE 15 MLS (10 G TOTAL) BY MOUTH DAILY FOR 30 DAYS., Disp: 473 mL, Rfl: 3 .  levothyroxine (SYNTHROID) 100 MCG tablet, Take 1 tablet (100 mcg total) by mouth daily before breakfast., Disp: 30 tablet, Rfl: 4 .  nitroGLYCERIN (NITROLINGUAL) 0.4 MG/SPRAY spray, Place 1 spray under the tongue every 5 (five) minutes x 3 doses as needed for chest pain., Disp: 12 g, Rfl: 2 .  ofloxacin (FLOXIN OTIC) 0.3 % OTIC solution, Place 5 drops into both ears daily for 10  days., Disp: 5 mL, Rfl: 1 .  oxyCODONE-acetaminophen (PERCOCET/ROXICET) 5-325 MG tablet, Take 1 tablet by mouth 4 (four) times daily. , Disp: , Rfl:  .  pantoprazole (PROTONIX) 40 MG tablet, TAKE 1 TABLET BY MOUTH TWICE A DAY, Disp: 60 tablet, Rfl: 1   Allergies  Allergen Reactions  . Vicodin [Hydrocodone-Acetaminophen] Rash  . Amoxicillin Rash and Other (See Comments)    Has patient had a PCN reaction causing immediate rash, facial/tongue/throat swelling, SOB or lightheadedness with hypotension: Yes Has patient had a PCN reaction causing severe rash involving mucus membranes or skin necrosis: No Has patient had a PCN reaction that required hospitalization: No Has patient had a PCN reaction occurring within the last 10 years: No If all of the above answers are "NO", then may proceed with Cephalosporin use.   . Gabapentin Rash    ROS Review of Systems  Constitutional: Negative.   HENT: Positive for congestion, ear  pain and postnasal drip.   Eyes: Negative.   Respiratory: Positive for cough.   Cardiovascular: Negative.   Gastrointestinal: Positive for anal bleeding (hemorrhoid) and constipation.  Endocrine: Negative.   Genitourinary: Negative.   Musculoskeletal: Positive for neck stiffness.  Skin: Negative.   Allergic/Immunologic: Positive for environmental allergies.  Neurological: Negative.   Hematological: Negative.   Psychiatric/Behavioral: Negative.   All other systems reviewed and are negative.    OBJECTIVE:    Physical Exam Vitals reviewed.  Constitutional:      Appearance: Normal appearance.  HENT:     Left Ear: Tenderness present.     Ears:     Comments: Otitis externa L>R    Mouth/Throat:     Mouth: Mucous membranes are moist.  Eyes:     Pupils: Pupils are equal, round, and reactive to light.  Cardiovascular:     Rate and Rhythm: Normal rate and regular rhythm.     Pulses: Normal pulses.     Heart sounds: Murmur heard.  Systolic (2/6) murmur is present.     Pulmonary:     Effort: Pulmonary effort is normal.     Breath sounds: Normal breath sounds.  Abdominal:     Palpations: There is no hepatomegaly, splenomegaly or mass.     Tenderness: There is no abdominal tenderness.  Musculoskeletal:     Cervical back: Muscular tenderness (left mandible) present.     Right lower leg: No edema.     Left lower leg: No edema.  Skin:    Findings: Bruising (legs) present.     Comments: Cyst on L ear & R Forehead  Neurological:     Mental Status: She is alert and oriented to person, place, and time.  Psychiatric:        Mood and Affect: Mood and affect normal.        Behavior: Behavior normal.    BP 133/87   Pulse 72   Ht 4\' 9"  (1.448 m)   Wt 194 lb 11.2 oz (88.3 kg)   LMP  (LMP Unknown) Comment: last menstrual 9 years ago, tubal ligation  BMI 42.13 kg/m  Wt Readings from Last 3 Encounters:  06/07/20 194 lb 11.2 oz (88.3 kg)  05/30/20 195 lb (88.5 kg)  05/27/20 195 lb 14.4 oz (88.9 kg)    Health Maintenance Due  Topic Date Due  . COVID-19 Vaccine (1) Never done  . TETANUS/TDAP  Never done  . PAP SMEAR-Modifier  Never done  . MAMMOGRAM  Never done    There are no preventive care reminders to display for this patient.  CBC Latest Ref Rng & Units 05/30/2020 04/19/2020 04/08/2020  WBC 4.0 - 10.5 K/uL 3.6(L) 2.5(L) 2.2(L)  Hemoglobin 12.0 - 15.0 g/dL 13.1 12.7 11.8(L)  Hematocrit 36 - 46 % 37.9 38.0 35.1(L)  Platelets 150 - 400 K/uL 51(L) 42(L) 38(L)   CMP Latest Ref Rng & Units 05/30/2020 04/19/2020 04/08/2020  Glucose 70 - 99 mg/dL 110(H) 95 119(H)  BUN 6 - 20 mg/dL 10 10 10   Creatinine 0.44 - 1.00 mg/dL 0.56 0.38(L) 0.47  Sodium 135 - 145 mmol/L 141 141 140  Potassium 3.5 - 5.1 mmol/L 3.6 4.0 3.8  Chloride 98 - 111 mmol/L 111 110 110  CO2 22 - 32 mmol/L 24 23 24   Calcium 8.9 - 10.3 mg/dL 8.8(L) 8.5(L) 8.7(L)  Total Protein 6.5 - 8.1 g/dL 6.9 6.2 6.5  Total Bilirubin 0.3 - 1.2 mg/dL 1.6(H) 1.0 1.2  Alkaline Phos 38 - 126 U/L  119 - 115   AST 15 - 41 U/L 40 42(H) 41  ALT 0 - 44 U/L 28 22 24     Lab Results  Component Value Date   TSH 0.02 (L) 04/19/2020   Lab Results  Component Value Date   ALBUMIN 3.5 05/30/2020   ANIONGAP 6 05/30/2020   Lab Results  Component Value Date   CHOL 107 12/29/2017   CHOL 109 08/25/2013   CHOL 84 07/03/2012   HDL 21 (L) 08/25/2013   HDL 18 (L) 07/03/2012   HDL 24 (L) 04/08/2012   LDLCALC 67 08/25/2013   LDLCALC 40 07/03/2012   LDLCALC 56 04/08/2012   Lab Results  Component Value Date   TRIG 107 08/25/2013   Lab Results  Component Value Date   HGBA1C 4.9 12/29/2017      ASSESSMENT & PLAN:   Problem List Items Addressed This Visit      Cardiovascular and Mediastinum   Hypertension    - Today, the patient's blood pressure is well managed on  Current treatment - The patient will not change the current treatment regimen.  - I encouraged the patient to eat a low-sodium diet to help control blood pressure. - I encouraged the patient to live an active lifestyle and complete activities that increases heart rate to 85% target heart rate at least 5 times per week for one hour.           Digestive   Other cirrhosis of liver ([place])    Patient do not have any GI bleed no ascites was noted on physical examination of the abdomen.  There is no swelling of the legs.  She was advised to follow-up with GI specialist        Nervous and Auditory   Acute otitis externa of both ears - Primary    Patient was started on ofloxacin  she was found to have otitis external of the left ear and right ear  Throat is not congested she was also found to be tender in the left submandibular region  No lymph nodes were palpable.        Other   Obesity (BMI 35.0-39.9 without comorbidity)    - I encouraged the patient to lose weight.  - I educated them on making healthy dietary choices including eating more fruits and vegetables and less fried foods. - I encouraged the patient to exercise  more, and educated on the benefits of exercise including weight loss, diabetes prevention, and hypertension prevention.           Meds ordered this encounter  Medications  . ofloxacin (FLOXIN OTIC) 0.3 % OTIC solution    Sig: Place 5 drops into both ears daily for 10 days.    Dispense:  5 mL    Refill:  1     Acute otitis externa of both ears, unspecified type  Obesity (BMI 35.0-39.9 without comorbidity)  Essential hypertension  Other cirrhosis of liver ([place])   Follow-up: Return in about 3 months (around 09/07/2020) for As Scheduled.    Dr. Jane Canary [facility] [address] [place], [place] [address]   By signing my name below I amber attest that this documentation has been prepared under the direction and in the presence of Cletis Athens, MD. Electronically Signed: Cletis Athens, MD 06/07/20, 2:47 PM   I personally performed the services described in this documentation, which was SCRIBED in my presence. The recorded information has been reviewed and considered accurate. It has  been edited as necessary during review. Cletis Athens, MD

## 2020-06-12 DIAGNOSIS — G894 Chronic pain syndrome: Secondary | ICD-10-CM | POA: Diagnosis not present

## 2020-06-12 DIAGNOSIS — Z79899 Other long term (current) drug therapy: Secondary | ICD-10-CM | POA: Diagnosis not present

## 2020-06-12 DIAGNOSIS — M5412 Radiculopathy, cervical region: Secondary | ICD-10-CM | POA: Diagnosis not present

## 2020-06-12 DIAGNOSIS — M542 Cervicalgia: Secondary | ICD-10-CM | POA: Diagnosis not present

## 2020-06-12 DIAGNOSIS — Z79891 Long term (current) use of opiate analgesic: Secondary | ICD-10-CM | POA: Diagnosis not present

## 2020-07-13 DIAGNOSIS — U071 COVID-19: Secondary | ICD-10-CM | POA: Diagnosis not present

## 2020-07-13 DIAGNOSIS — Z20822 Contact with and (suspected) exposure to covid-19: Secondary | ICD-10-CM | POA: Diagnosis not present

## 2020-08-02 ENCOUNTER — Other Ambulatory Visit: Payer: Self-pay | Admitting: Internal Medicine

## 2020-08-02 DIAGNOSIS — E039 Hypothyroidism, unspecified: Secondary | ICD-10-CM

## 2020-08-14 DIAGNOSIS — Z79891 Long term (current) use of opiate analgesic: Secondary | ICD-10-CM | POA: Diagnosis not present

## 2020-08-14 DIAGNOSIS — Z79899 Other long term (current) drug therapy: Secondary | ICD-10-CM | POA: Diagnosis not present

## 2020-08-26 ENCOUNTER — Encounter: Payer: Self-pay | Admitting: Internal Medicine

## 2020-08-26 ENCOUNTER — Other Ambulatory Visit: Payer: Self-pay

## 2020-08-26 ENCOUNTER — Ambulatory Visit (INDEPENDENT_AMBULATORY_CARE_PROVIDER_SITE_OTHER): Payer: Medicaid Other | Admitting: Internal Medicine

## 2020-08-26 VITALS — BP 144/102 | HR 82 | Ht 59.0 in | Wt 194.3 lb

## 2020-08-26 DIAGNOSIS — R1031 Right lower quadrant pain: Secondary | ICD-10-CM | POA: Insufficient documentation

## 2020-08-26 DIAGNOSIS — E669 Obesity, unspecified: Secondary | ICD-10-CM | POA: Diagnosis not present

## 2020-08-26 DIAGNOSIS — T148XXA Other injury of unspecified body region, initial encounter: Secondary | ICD-10-CM

## 2020-08-26 DIAGNOSIS — Z20822 Contact with and (suspected) exposure to covid-19: Secondary | ICD-10-CM | POA: Diagnosis not present

## 2020-08-26 LAB — POCT URINALYSIS DIPSTICK
Bilirubin, UA: NEGATIVE
Blood, UA: NEGATIVE
Glucose, UA: NEGATIVE
Ketones, UA: NEGATIVE
Leukocytes, UA: NEGATIVE
Nitrite, UA: NEGATIVE
Protein, UA: NEGATIVE
Spec Grav, UA: 1.015 (ref 1.010–1.025)
Urobilinogen, UA: 0.2 E.U./dL
pH, UA: 7 (ref 5.0–8.0)

## 2020-08-26 LAB — POC COVID19 BINAXNOW: SARS Coronavirus 2 Ag: NEGATIVE

## 2020-08-26 NOTE — Assessment & Plan Note (Signed)
-   I encouraged the patient to lose weight.  - I educated them on making healthy dietary choices including eating more fruits and vegetables and less fried foods. - I encouraged the patient to exercise more, and educated on the benefits of exercise including weight loss, diabetes prevention, and hypertension prevention.   

## 2020-08-26 NOTE — Assessment & Plan Note (Signed)
Pt presented to the office with RLQ pain with radiation to right shoulder. She complained of nausea without vomiting. There is no flank pain. The pain does not radiate the legs or groin. Rebound tenderness is positive. Urinalysis completed.

## 2020-08-26 NOTE — Assessment & Plan Note (Signed)
Pt has brusing on both legs, L>R suggestive of supperficial thrombophlebitis. I advised her to use a heating pad. We can not use advil because of her portal hypertension.

## 2020-08-26 NOTE — Progress Notes (Signed)
Established Patient Office Visit  SUBJECTIVE:  Subjective  Patient ID: Brandi Bright, female    DOB: 07-Mar-1971  Age: 49 y.o. MRN: [id number]  CC:  Chief Complaint  Patient presents with  . Abdominal Cramping    patient having cramping lower abdomen and feels nauseated x 2 days  . easy bruising    patient having issues with bruising     HPI ALLECIA Bright is a 49 y.o. female presenting today for abdominal cramping with nausea and bruising.  She has been having abnormal bruising across her legs. She does have some pain with them. She denies any recent trauma.   She also complains of nausea in her lower right quadrant. She notes that the pain is sharp and is making her nauseated. She denies fevers. She denies urinary symptoms. She denies gastrointestinal symptoms. Last PAP 13 years ago. Last sexual activity 5 years ago.   Past Medical History:  Diagnosis Date  . Arthritis   . Back pain   . Gout   . Hypothyroidism   . IDA (iron deficiency anemia) 01/16/2020  . MI, old   . Thyroid disease     Past Surgical History:  Procedure Laterality Date  . CARDIAC CATHETERIZATION    . CESAREAN SECTION    . COLONOSCOPY WITH PROPOFOL N/A 03/16/2018   Procedure: COLONOSCOPY WITH PROPOFOL;  Surgeon: Virgel Manifold, MD;  Location: [facility] ENDOSCOPY;  Service: Endoscopy;  Laterality: N/A;  . ESOPHAGOGASTRODUODENOSCOPY N/A 10/11/2019   Procedure: ESOPHAGOGASTRODUODENOSCOPY (EGD);  Surgeon: Lin Landsman, MD;  Location: [facility] ENDOSCOPY;  Service: Gastroenterology;  Laterality: N/A;  . ESOPHAGOGASTRODUODENOSCOPY (EGD) WITH PROPOFOL N/A 10/27/2018   Procedure: ESOPHAGOGASTRODUODENOSCOPY (EGD) WITH PROPOFOL;  Surgeon: Virgel Manifold, MD;  Location: [facility] ENDOSCOPY;  Service: Endoscopy;  Laterality: N/A;  . ESOPHAGOGASTRODUODENOSCOPY (EGD) WITH PROPOFOL N/A 06/25/2019   Procedure: ESOPHAGOGASTRODUODENOSCOPY (EGD) WITH PROPOFOL;  Surgeon: Jonathon Bellows, MD;  Location: [facility] ENDOSCOPY;  Service:  Gastroenterology;  Laterality: N/A;  . ESOPHAGOGASTRODUODENOSCOPY (EGD) WITH PROPOFOL N/A 09/12/2019   Procedure: ESOPHAGOGASTRODUODENOSCOPY (EGD) WITH PROPOFOL;  Surgeon: Lucilla Lame, MD;  Location: [facility] ENDOSCOPY;  Service: Endoscopy;  Laterality: N/A;  . ESOPHAGOGASTRODUODENOSCOPY (EGD) WITH PROPOFOL N/A 11/20/2019   Procedure: ESOPHAGOGASTRODUODENOSCOPY (EGD) WITH PROPOFOL;  Surgeon: Virgel Manifold, MD;  Location: [facility] ENDOSCOPY;  Service: Endoscopy;  Laterality: N/A;  . ESOPHAGOGASTRODUODENOSCOPY (EGD) WITH PROPOFOL N/A 11/23/2019   Procedure: ESOPHAGOGASTRODUODENOSCOPY (EGD) WITH PROPOFOL;  Surgeon: Virgel Manifold, MD;  Location: [facility] ENDOSCOPY;  Service: Endoscopy;  Laterality: N/A;  . ESOPHAGOGASTRODUODENOSCOPY (EGD) WITH PROPOFOL N/A 01/03/2020   Procedure: ESOPHAGOGASTRODUODENOSCOPY (EGD) WITH PROPOFOL;  Surgeon: Lin Landsman, MD;  Location: [facility] ENDOSCOPY;  Service: Endoscopy;  Laterality: N/A;  . TUBAL LIGATION      Family History  Problem Relation Age of Onset  . Hypertension Other   . CAD Father   . Colon cancer Father   . Lung cancer Father   . Pancreatic cancer Brother     Social History   Socioeconomic History  . Marital status: Single    Spouse name: Not on file  . Number of children: Not on file  . Years of education: Not on file  . Highest education level: Not on file  Occupational History  . Not on file  Tobacco Use  . Smoking status: Never Smoker  . Smokeless tobacco: Never Used  Vaping Use  . Vaping Use: Never used  Substance and Sexual Activity  . Alcohol use: Yes    Comment: rarely  .  Drug use: Never  . Sexual activity: Not Currently  Other Topics Concern  . Not on file  Social History Narrative  . Not on file   Social Determinants of Health   Financial Resource Strain: Medium Risk  . Difficulty of Paying Living Expenses: Somewhat hard  Food Insecurity: No Food Insecurity  . Worried About Charity fundraiser in the Last  Year: Never true  . Ran Out of Food in the Last Year: Never true  Transportation Needs: No Transportation Needs  . Lack of Transportation (Medical): No  . Lack of Transportation (Non-Medical): No  Physical Activity: Inactive  . Days of Exercise per Week: 0 days  . Minutes of Exercise per Session: 0 min  Stress: Stress Concern Present  . Feeling of Stress : Rather much  Social Connections: Unknown  . Frequency of Communication with Friends and Family: More than three times a week  . Frequency of Social Gatherings with Friends and Family: More than three times a week  . Attends Religious Services: Never  . Active Member of Clubs or Organizations: No  . Attends Archivist Meetings: Not asked  . Marital Status: Not on file  Intimate Partner Violence: Not At Risk  . Fear of Current or Ex-Partner: No  . Emotionally Abused: No  . Physically Abused: No  . Sexually Abused: No     Current Outpatient Medications:  .  aspirin EC 81 MG EC tablet, Take 1 tablet (81 mg total) by mouth daily., Disp: 90 tablet, Rfl: 0 .  cyanocobalamin ([employer] VITAMIN B12) 1000 MCG tablet, Take 1 tablet (1,000 mcg total) by mouth daily., Disp: 90 tablet, Rfl: 1 .  diclofenac Sodium (VOLTAREN) 1 % GEL, Apply 5 g topically 4 (four) times daily., Disp: , Rfl:  .  docusate sodium (COLACE) 100 MG capsule, Take 1 capsule (100 mg total) by mouth 2 (two) times daily., Disp: 60 capsule, Rfl: 2 .  ferrous sulfate 325 (65 FE) MG EC tablet, TAKE 1 TABLET BY MOUTH EVERY DAY WITH BREAKFAST, Disp: 30 tablet, Rfl: 2 .  hydrocortisone (ANUSOL-HC) 25 MG suppository, Place 1 suppository (25 mg total) rectally every 12 (twelve) hours., Disp: 12 suppository, Rfl: 1 .  lactulose (CHRONULAC) 10 GM/15ML solution, TAKE 15 MLS (10 G TOTAL) BY MOUTH DAILY FOR 30 DAYS., Disp: 473 mL, Rfl: 3 .  nitroGLYCERIN (NITROLINGUAL) 0.4 MG/SPRAY spray, Place 1 spray under the tongue every 5 (five) minutes x 3 doses as needed for chest pain., Disp:  12 g, Rfl: 2 .  oxyCODONE-acetaminophen (PERCOCET/ROXICET) 5-325 MG tablet, Take 1 tablet by mouth 4 (four) times daily. , Disp: , Rfl:  .  pantoprazole (PROTONIX) 40 MG tablet, TAKE 1 TABLET BY MOUTH TWICE A DAY, Disp: 60 tablet, Rfl: 1 .  SYNTHROID 100 MCG tablet, TAKE 1 TABLET BY MOUTH DAILY BEFORE BREAKFAST., Disp: 90 tablet, Rfl: 1   Allergies  Allergen Reactions  . Vicodin [Hydrocodone-Acetaminophen] Rash  . Amoxicillin Rash and Other (See Comments)    Has patient had a PCN reaction causing immediate rash, facial/tongue/throat swelling, SOB or lightheadedness with hypotension: Yes Has patient had a PCN reaction causing severe rash involving mucus membranes or skin necrosis: No Has patient had a PCN reaction that required hospitalization: No Has patient had a PCN reaction occurring within the last 10 years: No If all of the above answers are "NO", then may proceed with Cephalosporin use.   . Gabapentin Rash    ROS Review of Systems  Constitutional: Negative.  Negative for chills and fever.  HENT: Negative.   Eyes: Negative.   Respiratory: Negative.   Cardiovascular: Negative.   Gastrointestinal: Positive for abdominal pain (RLQ, 7/10) and nausea (w/ pain). Negative for blood in stool, constipation (last BM yesterday) and vomiting.  Endocrine: Negative.   Genitourinary: Negative.  Negative for dysuria, frequency, hematuria and urgency.  Musculoskeletal: Negative.   Skin: Negative.   Allergic/Immunologic: Negative.   Neurological: Negative.   Hematological: Bruises/bleeds easily (legs).  Psychiatric/Behavioral: Negative.   All other systems reviewed and are negative.    OBJECTIVE:    Physical Exam Vitals reviewed.  Constitutional:      Appearance: Normal appearance. She is obese.  HENT:     Mouth/Throat:     Mouth: Mucous membranes are moist.  Eyes:     Pupils: Pupils are equal, round, and reactive to light.  Neck:     Vascular: No carotid bruit.  Cardiovascular:       Rate and Rhythm: Normal rate and regular rhythm.     Pulses: Normal pulses.     Heart sounds: Normal heart sounds.  Pulmonary:     Effort: Pulmonary effort is normal.     Breath sounds: Normal breath sounds.  Abdominal:     General: Bowel sounds are normal.     Palpations: Abdomen is soft. There is no hepatomegaly, splenomegaly or mass.     Tenderness: There is abdominal tenderness (radiated to right shoulder) in the right lower quadrant.     Hernia: No hernia is present.  Musculoskeletal:        General: No tenderness.     Cervical back: Neck supple.     Right lower leg: No edema.     Left lower leg: No edema.  Skin:    Findings: Bruising (B legs) present. No rash.  Neurological:     Mental Status: She is alert and oriented to person, place, and time.     Motor: No weakness.  Psychiatric:        Mood and Affect: Mood and affect normal.        Behavior: Behavior normal.     BP (!) 144/102   Pulse 82   Ht 4\' 11"  (1.499 m)   Wt 194 lb 4.8 oz (88.1 kg)   LMP  (LMP Unknown) Comment: last menstrual 9 years ago, tubal ligation  BMI 39.24 kg/m  Wt Readings from Last 3 Encounters:  08/26/20 194 lb 4.8 oz (88.1 kg)  06/07/20 194 lb 11.2 oz (88.3 kg)  05/30/20 195 lb (88.5 kg)    Health Maintenance Due  Topic Date Due  . COVID-19 Vaccine (1) Never done  . TETANUS/TDAP  Never done  . PAP SMEAR-Modifier  Never done  . MAMMOGRAM  Never done  . INFLUENZA VACCINE  Never done    There are no preventive care reminders to display for this patient.  CBC Latest Ref Rng & Units 05/30/2020 04/19/2020 04/08/2020  WBC 4.0 - 10.5 K/uL 3.6(L) 2.5(L) 2.2(L)  Hemoglobin 12.0 - 15.0 g/dL 13.1 12.7 11.8(L)  Hematocrit 36 - 46 % 37.9 38.0 35.1(L)  Platelets 150 - 400 K/uL 51(L) 42(L) 38(L)   CMP Latest Ref Rng & Units 05/30/2020 04/19/2020 04/08/2020  Glucose 70 - 99 mg/dL 110(H) 95 119(H)  BUN 6 - 20 mg/dL 10 10 10   Creatinine 0.44 - 1.00 mg/dL 0.56 0.38(L) 0.47  Sodium 135 - 145 mmol/L  141 141 140  Potassium 3.5 - 5.1 mmol/L 3.6 4.0 3.8  Chloride 98 -  111 mmol/L 111 110 110  CO2 22 - 32 mmol/L 24 23 24   Calcium 8.9 - 10.3 mg/dL 8.8(L) 8.5(L) 8.7(L)  Total Protein 6.5 - 8.1 g/dL 6.9 6.2 6.5  Total Bilirubin 0.3 - 1.2 mg/dL 1.6(H) 1.0 1.2  Alkaline Phos 38 - 126 U/L 119 - 115  AST 15 - 41 U/L 40 42(H) 41  ALT 0 - 44 U/L 28 22 24     Lab Results  Component Value Date   TSH 0.02 (L) 04/19/2020   Lab Results  Component Value Date   ALBUMIN 3.5 05/30/2020   ANIONGAP 6 05/30/2020   Lab Results  Component Value Date   CHOL 107 12/29/2017   CHOL 109 08/25/2013   CHOL 84 07/03/2012   HDL 21 (L) 08/25/2013   HDL 18 (L) 07/03/2012   HDL 24 (L) 04/08/2012   LDLCALC 67 08/25/2013   LDLCALC 40 07/03/2012   LDLCALC 56 04/08/2012   Lab Results  Component Value Date   TRIG 107 08/25/2013   Lab Results  Component Value Date   HGBA1C 4.9 12/29/2017      ASSESSMENT & PLAN:   Problem List Items Addressed This Visit      Other   Obesity (BMI 35.0-39.9 without comorbidity)    - I encouraged the patient to lose weight.  - I educated them on making healthy dietary choices including eating more fruits and vegetables and less fried foods. - I encouraged the patient to exercise more, and educated on the benefits of exercise including weight loss, diabetes prevention, and hypertension prevention.        RLQ abdominal pain    Pt presented to the office with RLQ pain with radiation to right shoulder. She complained of nausea without vomiting. There is no flank pain. The pain does not radiate the legs or groin. Rebound tenderness is positive. Urinalysis completed.      Close exposure to COVID-19 virus   Relevant Orders   POC COVID-19 (Completed)   Bruising - Primary    Pt has brusing on both legs, L>R suggestive of supperficial thrombophlebitis. I advised her to use a heating pad. We can not use advil because of her portal hypertension.       Relevant Orders   POCT  urinalysis dipstick (Completed)   COMPLETE METABOLIC PANEL WITH GFR   CBC with Differential/Platelet      No orders of the defined types were placed in this encounter.   Follow-up: No follow-ups on file.    Cletis Athens, MD [facility] [address], [place], [place] [address]   By signing my name below, I, [employer], attest that this documentation has been prepared under the direction and in the presence of Dr. Cletis Athens Electronically Signed: Cletis Athens, MD 08/26/20, 5:14 PM  I personally performed the services described in this documentation, which was SCRIBED in my presence. The recorded information has been reviewed and considered accurate. It has been edited as necessary during review. Cletis Athens, MD

## 2020-08-27 ENCOUNTER — Encounter: Payer: Self-pay | Admitting: Internal Medicine

## 2020-08-27 ENCOUNTER — Ambulatory Visit: Payer: Medicaid Other | Admitting: Internal Medicine

## 2020-08-27 ENCOUNTER — Ambulatory Visit (INDEPENDENT_AMBULATORY_CARE_PROVIDER_SITE_OTHER): Payer: Medicaid Other | Admitting: Internal Medicine

## 2020-08-27 VITALS — BP 132/91 | HR 90 | Ht 59.0 in | Wt 194.0 lb

## 2020-08-27 DIAGNOSIS — E669 Obesity, unspecified: Secondary | ICD-10-CM

## 2020-08-27 DIAGNOSIS — D500 Iron deficiency anemia secondary to blood loss (chronic): Secondary | ICD-10-CM

## 2020-08-27 DIAGNOSIS — R1031 Right lower quadrant pain: Secondary | ICD-10-CM

## 2020-08-27 DIAGNOSIS — K7469 Other cirrhosis of liver: Secondary | ICD-10-CM | POA: Diagnosis not present

## 2020-08-27 LAB — COMPLETE METABOLIC PANEL WITH GFR
AG Ratio: 1 (calc) (ref 1.0–2.5)
ALT: 18 U/L (ref 6–29)
AST: 36 U/L — ABNORMAL HIGH (ref 10–35)
Albumin: 3.4 g/dL — ABNORMAL LOW (ref 3.6–5.1)
Alkaline phosphatase (APISO): 112 U/L (ref 37–153)
BUN: 8 mg/dL (ref 7–25)
CO2: 24 mmol/L (ref 20–32)
Calcium: 8.5 mg/dL — ABNORMAL LOW (ref 8.6–10.4)
Chloride: 109 mmol/L (ref 98–110)
Creat: 0.52 mg/dL (ref 0.50–1.05)
GFR, Est African American: 129 mL/min/{1.73_m2} (ref >=60)
GFR, Est Non African American: 111 mL/min/{1.73_m2} (ref >=60)
Globulin: 3.5 g/dL (calc) (ref 1.9–3.7)
Glucose, Bld: 96 mg/dL (ref 65–99)
Potassium: 3.9 mmol/L (ref 3.5–5.3)
Sodium: 140 mmol/L (ref 135–146)
Total Bilirubin: 1.3 mg/dL — ABNORMAL HIGH (ref 0.2–1.2)
Total Protein: 6.9 g/dL (ref 6.1–8.1)

## 2020-08-27 LAB — CBC WITH DIFFERENTIAL/PLATELET
Absolute Monocytes: 163 cells/uL — ABNORMAL LOW (ref 200–950)
Basophils Absolute: 30 cells/uL (ref 0–200)
Basophils Relative: 1.2 %
Eosinophils Absolute: 113 cells/uL (ref 15–500)
Eosinophils Relative: 4.5 %
HCT: 35.9 % (ref 35.0–45.0)
Hemoglobin: 11.9 g/dL (ref 11.7–15.5)
Lymphs Abs: 833 cells/uL — ABNORMAL LOW (ref 850–3900)
MCH: 30.4 pg (ref 27.0–33.0)
MCHC: 33.1 g/dL (ref 32.0–36.0)
MCV: 91.8 fL (ref 80.0–100.0)
MPV: 12.9 fL — ABNORMAL HIGH (ref 7.5–12.5)
Monocytes Relative: 6.5 %
Neutro Abs: 1363 cells/uL — ABNORMAL LOW (ref 1500–7800)
Neutrophils Relative %: 54.5 %
Platelets: 45 10*3/uL — ABNORMAL LOW (ref 140–400)
RBC: 3.91 10*6/uL (ref 3.80–5.10)
RDW: 14.1 % (ref 11.0–15.0)
Total Lymphocyte: 33.3 %
WBC: 2.5 10*3/uL — ABNORMAL LOW (ref 3.8–10.8)

## 2020-08-27 NOTE — Assessment & Plan Note (Signed)
-   I encouraged the patient to lose weight.  - I educated them on making healthy dietary choices including eating more fruits and vegetables and less fried foods. - I encouraged the patient to exercise more, and educated on the benefits of exercise including weight loss, diabetes prevention, and hypertension prevention.   

## 2020-08-27 NOTE — Assessment & Plan Note (Signed)
She is being followed by Hematology, Dr. Tasia Catchings.

## 2020-08-27 NOTE — Assessment & Plan Note (Signed)
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 She has been treated for that.

## 2020-08-27 NOTE — Progress Notes (Signed)
Established Patient Office Visit  SUBJECTIVE:  Subjective  Patient ID: Brandi Bright, female    DOB: February 24, 1971  Age: 49 y.o. MRN: [id number]  CC:  Chief Complaint  Patient presents with  . Lab Results    HPI Brandi Bright is a 49 y.o. female presenting today for a discussion of her lab results.   She states that she is feeling a bit better than she was yesterday. The pain in her abdomen is better; it still hurts with palpatation, but not generally. She has a normal bowel movement this morning. Her COVID19 test was negative.   Her CBC was normal except for WBC 2500, Platelets 45000, MPV 12.9, ANC 1363, ALC 833, and AMC 163. Her CMP was normal except for calcium 8.5, albumin 3.4, total bilirubin 1.3, and AST 36. Her urinalysis was unremarkable.   She is followed by Dr. Tasia Catchings in hematology for her IDA.    Past Medical History:  Diagnosis Date  . Arthritis   . Back pain   . Gout   . Hypothyroidism   . IDA (iron deficiency anemia) 01/16/2020  . MI, old   . Thyroid disease     Past Surgical History:  Procedure Laterality Date  . CARDIAC CATHETERIZATION    . CESAREAN SECTION    . COLONOSCOPY WITH PROPOFOL N/A 03/16/2018   Procedure: COLONOSCOPY WITH PROPOFOL;  Surgeon: Virgel Manifold, MD;  Location: [facility] ENDOSCOPY;  Service: Endoscopy;  Laterality: N/A;  . ESOPHAGOGASTRODUODENOSCOPY N/A 10/11/2019   Procedure: ESOPHAGOGASTRODUODENOSCOPY (EGD);  Surgeon: Lin Landsman, MD;  Location: [facility] ENDOSCOPY;  Service: Gastroenterology;  Laterality: N/A;  . ESOPHAGOGASTRODUODENOSCOPY (EGD) WITH PROPOFOL N/A 10/27/2018   Procedure: ESOPHAGOGASTRODUODENOSCOPY (EGD) WITH PROPOFOL;  Surgeon: Virgel Manifold, MD;  Location: [facility] ENDOSCOPY;  Service: Endoscopy;  Laterality: N/A;  . ESOPHAGOGASTRODUODENOSCOPY (EGD) WITH PROPOFOL N/A 06/25/2019   Procedure: ESOPHAGOGASTRODUODENOSCOPY (EGD) WITH PROPOFOL;  Surgeon: Jonathon Bellows, MD;  Location: [facility] ENDOSCOPY;  Service: Gastroenterology;   Laterality: N/A;  . ESOPHAGOGASTRODUODENOSCOPY (EGD) WITH PROPOFOL N/A 09/12/2019   Procedure: ESOPHAGOGASTRODUODENOSCOPY (EGD) WITH PROPOFOL;  Surgeon: Lucilla Lame, MD;  Location: [facility] ENDOSCOPY;  Service: Endoscopy;  Laterality: N/A;  . ESOPHAGOGASTRODUODENOSCOPY (EGD) WITH PROPOFOL N/A 11/20/2019   Procedure: ESOPHAGOGASTRODUODENOSCOPY (EGD) WITH PROPOFOL;  Surgeon: Virgel Manifold, MD;  Location: [facility] ENDOSCOPY;  Service: Endoscopy;  Laterality: N/A;  . ESOPHAGOGASTRODUODENOSCOPY (EGD) WITH PROPOFOL N/A 11/23/2019   Procedure: ESOPHAGOGASTRODUODENOSCOPY (EGD) WITH PROPOFOL;  Surgeon: Virgel Manifold, MD;  Location: [facility] ENDOSCOPY;  Service: Endoscopy;  Laterality: N/A;  . ESOPHAGOGASTRODUODENOSCOPY (EGD) WITH PROPOFOL N/A 01/03/2020   Procedure: ESOPHAGOGASTRODUODENOSCOPY (EGD) WITH PROPOFOL;  Surgeon: Lin Landsman, MD;  Location: [facility] ENDOSCOPY;  Service: Endoscopy;  Laterality: N/A;  . TUBAL LIGATION      Family History  Problem Relation Age of Onset  . Hypertension Other   . CAD Father   . Colon cancer Father   . Lung cancer Father   . Pancreatic cancer Brother     Social History   Socioeconomic History  . Marital status: Single    Spouse name: Not on file  . Number of children: Not on file  . Years of education: Not on file  . Highest education level: Not on file  Occupational History  . Not on file  Tobacco Use  . Smoking status: Never Smoker  . Smokeless tobacco: Never Used  Vaping Use  . Vaping Use: Never used  Substance and Sexual Activity  . Alcohol use: Yes    Comment:  rarely  . Drug use: Never  . Sexual activity: Not Currently  Other Topics Concern  . Not on file  Social History Narrative  . Not on file   Social Determinants of Health   Financial Resource Strain: Medium Risk  . Difficulty of Paying Living Expenses: Somewhat hard  Food Insecurity: No Food Insecurity  . Worried About Charity fundraiser in the Last Year: Never true  .  Ran Out of Food in the Last Year: Never true  Transportation Needs: No Transportation Needs  . Lack of Transportation (Medical): No  . Lack of Transportation (Non-Medical): No  Physical Activity: Inactive  . Days of Exercise per Week: 0 days  . Minutes of Exercise per Session: 0 min  Stress: Stress Concern Present  . Feeling of Stress : Rather much  Social Connections: Unknown  . Frequency of Communication with Friends and Family: More than three times a week  . Frequency of Social Gatherings with Friends and Family: More than three times a week  . Attends Religious Services: Never  . Active Member of Clubs or Organizations: No  . Attends Archivist Meetings: Not asked  . Marital Status: Not on file  Intimate Partner Violence: Not At Risk  . Fear of Current or Ex-Partner: No  . Emotionally Abused: No  . Physically Abused: No  . Sexually Abused: No     Current Outpatient Medications:  .  aspirin EC 81 MG EC tablet, Take 1 tablet (81 mg total) by mouth daily., Disp: 90 tablet, Rfl: 0 .  cyanocobalamin ([employer] VITAMIN B12) 1000 MCG tablet, Take 1 tablet (1,000 mcg total) by mouth daily., Disp: 90 tablet, Rfl: 1 .  diclofenac Sodium (VOLTAREN) 1 % GEL, Apply 5 g topically 4 (four) times daily., Disp: , Rfl:  .  docusate sodium (COLACE) 100 MG capsule, Take 1 capsule (100 mg total) by mouth 2 (two) times daily., Disp: 60 capsule, Rfl: 2 .  ferrous sulfate 325 (65 FE) MG EC tablet, TAKE 1 TABLET BY MOUTH EVERY DAY WITH BREAKFAST, Disp: 30 tablet, Rfl: 2 .  hydrocortisone (ANUSOL-HC) 25 MG suppository, Place 1 suppository (25 mg total) rectally every 12 (twelve) hours., Disp: 12 suppository, Rfl: 1 .  lactulose (CHRONULAC) 10 GM/15ML solution, TAKE 15 MLS (10 G TOTAL) BY MOUTH DAILY FOR 30 DAYS., Disp: 473 mL, Rfl: 3 .  nitroGLYCERIN (NITROLINGUAL) 0.4 MG/SPRAY spray, Place 1 spray under the tongue every 5 (five) minutes x 3 doses as needed for chest pain., Disp: 12 g, Rfl: 2 .   oxyCODONE-acetaminophen (PERCOCET/ROXICET) 5-325 MG tablet, Take 1 tablet by mouth 4 (four) times daily. , Disp: , Rfl:  .  pantoprazole (PROTONIX) 40 MG tablet, TAKE 1 TABLET BY MOUTH TWICE A DAY, Disp: 60 tablet, Rfl: 1 .  SYNTHROID 100 MCG tablet, TAKE 1 TABLET BY MOUTH DAILY BEFORE BREAKFAST., Disp: 90 tablet, Rfl: 1   Allergies  Allergen Reactions  . Vicodin [Hydrocodone-Acetaminophen] Rash  . Amoxicillin Rash and Other (See Comments)    Has patient had a PCN reaction causing immediate rash, facial/tongue/throat swelling, SOB or lightheadedness with hypotension: Yes Has patient had a PCN reaction causing severe rash involving mucus membranes or skin necrosis: No Has patient had a PCN reaction that required hospitalization: No Has patient had a PCN reaction occurring within the last 10 years: No If all of the above answers are "NO", then may proceed with Cephalosporin use.   . Gabapentin Rash    ROS Review of Systems  Constitutional: Negative.   HENT: Negative.   Eyes: Negative.   Respiratory: Negative.   Cardiovascular: Negative.   Gastrointestinal: Positive for abdominal pain (improved).  Endocrine: Negative.   Genitourinary: Negative.   Musculoskeletal: Negative.   Skin: Negative.   Allergic/Immunologic: Negative.   Neurological: Negative.   Hematological: Negative.   Psychiatric/Behavioral: Negative.   All other systems reviewed and are negative.    OBJECTIVE:    Physical Exam Vitals reviewed.  Constitutional:      Appearance: Normal appearance.  HENT:     Mouth/Throat:     Mouth: Mucous membranes are moist.  Eyes:     Pupils: Pupils are equal, round, and reactive to light.  Neck:     Vascular: No carotid bruit.  Cardiovascular:     Rate and Rhythm: Normal rate and regular rhythm.     Pulses: Normal pulses.     Heart sounds: Normal heart sounds.  Pulmonary:     Effort: Pulmonary effort is normal.     Breath sounds: Normal breath sounds.  Abdominal:      General: Bowel sounds are normal.     Palpations: Abdomen is soft. There is no hepatomegaly, splenomegaly or mass.     Tenderness: There is abdominal tenderness (improved overall) in the right lower quadrant.     Hernia: No hernia is present.  Musculoskeletal:        General: No tenderness.     Cervical back: Neck supple.     Right lower leg: No edema.     Left lower leg: No edema.  Skin:    Findings: No rash.  Neurological:     Mental Status: She is alert and oriented to person, place, and time.     Motor: No weakness.  Psychiatric:        Mood and Affect: Mood and affect normal.        Behavior: Behavior normal.     BP (!) 132/91   Pulse 90   Ht 4\' 11"  (1.499 m)   Wt 194 lb (88 kg)   LMP  (LMP Unknown) Comment: last menstrual 9 years ago, tubal ligation  BMI 39.18 kg/m  Wt Readings from Last 3 Encounters:  08/27/20 194 lb (88 kg)  08/26/20 194 lb 4.8 oz (88.1 kg)  06/07/20 194 lb 11.2 oz (88.3 kg)    Health Maintenance Due  Topic Date Due  . COVID-19 Vaccine (1) Never done  . TETANUS/TDAP  Never done  . PAP SMEAR-Modifier  Never done  . MAMMOGRAM  Never done  . INFLUENZA VACCINE  Never done    There are no preventive care reminders to display for this patient.  CBC Latest Ref Rng & Units 08/26/2020 05/30/2020 04/19/2020  WBC 3.8 - 10.8 Thousand/uL 2.5(L) 3.6(L) 2.5(L)  Hemoglobin 11.7 - 15.5 g/dL 11.9 13.1 12.7  Hematocrit 35 - 45 % 35.9 37.9 38.0  Platelets 140 - 400 Thousand/uL 45(L) 51(L) 42(L)   CMP Latest Ref Rng & Units 08/26/2020 05/30/2020 04/19/2020  Glucose 65 - 99 mg/dL 96 110(H) 95  BUN 7 - 25 mg/dL 8 10 10   Creatinine 0.50 - 1.05 mg/dL 0.52 0.56 0.38(L)  Sodium 135 - 146 mmol/L 140 141 141  Potassium 3.5 - 5.3 mmol/L 3.9 3.6 4.0  Chloride 98 - 110 mmol/L 109 111 110  CO2 20 - 32 mmol/L 24 24 23   Calcium 8.6 - 10.4 mg/dL 8.5(L) 8.8(L) 8.5(L)  Total Protein 6.1 - 8.1 g/dL 6.9 6.9 6.2  Total Bilirubin 0.2 -  1.2 mg/dL 1.3(H) 1.6(H) 1.0  Alkaline Phos 38  - 126 U/L - 119 -  AST 10 - 35 U/L 36(H) 40 42(H)  ALT 6 - 29 U/L 18 28 22     Lab Results  Component Value Date   TSH 0.02 (L) 04/19/2020   Lab Results  Component Value Date   ALBUMIN 3.5 05/30/2020   ANIONGAP 6 05/30/2020   Lab Results  Component Value Date   CHOL 107 12/29/2017   CHOL 109 08/25/2013   CHOL 84 07/03/2012   HDL 21 (L) 08/25/2013   HDL 18 (L) 07/03/2012   HDL 24 (L) 04/08/2012   LDLCALC 67 08/25/2013   LDLCALC 40 07/03/2012   LDLCALC 56 04/08/2012   Lab Results  Component Value Date   TRIG 107 08/25/2013   Lab Results  Component Value Date   HGBA1C 4.9 12/29/2017      ASSESSMENT & PLAN:   Problem List Items Addressed This Visit      Digestive   Other cirrhosis of liver (HCC)    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 She has been treated for that.         Other   Iron deficiency anemia due to chronic blood loss    She is being followed by Hematology, Dr. Tasia Catchings.       Obesity (BMI 35.0-39.9 without comorbidity) - Primary    - I encouraged the patient to lose weight.  - I educated them on making healthy dietary choices including eating more fruits and vegetables and less fried foods. - I encouraged the patient to exercise more, and educated on the benefits of exercise including weight loss, diabetes prevention, and hypertension prevention.        RLQ abdominal pain    The pt presented with RLQ pain yesterday. The pain has resolved. She does not have any nausea, vomiting, fever, or UTI symptoms. CBC shows anemia and leukopenia. She is being followed by Hematology. On exam, she is mildly tender in [place]. She does not have any clear cut evidence of appendicitis. She was advised to come back if there is further problems.          No orders of the defined types were placed in this encounter.   Follow-up: No follow-ups on file.    Cletis Athens, MD [facility] [address], [place], [place] [address]   By  signing my name below, I, [employer], attest that this documentation has been prepared under the direction and in the presence of Dr. Cletis Athens Electronically Signed: Cletis Athens, MD 08/27/20, 4:27 PM  I personally performed the services described in this documentation, which was SCRIBED in my presence. The recorded information has been reviewed and considered accurate. It has been edited as necessary during review. Cletis Athens, MD

## 2020-08-27 NOTE — Assessment & Plan Note (Signed)
The pt presented with RLQ pain yesterday. The pain has resolved. She does not have any nausea, vomiting, fever, or UTI symptoms. CBC shows anemia and leukopenia. She is being followed by Hematology. On exam, she is mildly tender in [place]. She does not have any clear cut evidence of appendicitis. She was advised to come back if there is further problems.

## 2020-09-06 ENCOUNTER — Other Ambulatory Visit: Payer: Self-pay

## 2020-09-06 ENCOUNTER — Ambulatory Visit (INDEPENDENT_AMBULATORY_CARE_PROVIDER_SITE_OTHER): Payer: Medicaid Other | Admitting: Internal Medicine

## 2020-09-06 ENCOUNTER — Encounter: Payer: Self-pay | Admitting: Internal Medicine

## 2020-09-06 VITALS — BP 137/94 | HR 78 | Ht 59.0 in | Wt 196.6 lb

## 2020-09-06 DIAGNOSIS — D696 Thrombocytopenia, unspecified: Secondary | ICD-10-CM

## 2020-09-06 DIAGNOSIS — K766 Portal hypertension: Secondary | ICD-10-CM | POA: Diagnosis not present

## 2020-09-06 DIAGNOSIS — K7469 Other cirrhosis of liver: Secondary | ICD-10-CM

## 2020-09-06 DIAGNOSIS — E669 Obesity, unspecified: Secondary | ICD-10-CM

## 2020-09-06 DIAGNOSIS — R1031 Right lower quadrant pain: Secondary | ICD-10-CM | POA: Diagnosis not present

## 2020-09-06 DIAGNOSIS — I10 Essential (primary) hypertension: Secondary | ICD-10-CM

## 2020-09-06 NOTE — Assessment & Plan Note (Signed)
-   I encouraged the patient to lose weight.  - I educated them on making healthy dietary choices including eating more fruits and vegetables and less fried foods. - I encouraged the patient to exercise more, and educated on the benefits of exercise including weight loss, diabetes prevention, and hypertension prevention.   

## 2020-09-06 NOTE — Assessment & Plan Note (Signed)
Stable  Pt dont drink  Or smoke

## 2020-09-06 NOTE — Assessment & Plan Note (Signed)
stable °

## 2020-09-06 NOTE — Progress Notes (Signed)
Patient ID: Brandi Bright, female   DOB: [birth year], 49 y.o.   MRN: [id number]    Established Patient Office Visit  Subjective:  Patient ID: Brandi Bright, female    DOB: 1971-10-06  Age: 49 y.o. MRN: [id number]  CC: No chief complaint on file.   HPI  Brandi Bright presents for a 2 week follow up regarding her right lower quadrant pain. This pain has resolved. She had a headache last night but reported no additional symptoms or complaints.  Past Medical History:  Diagnosis Date  . Arthritis   . Back pain   . Gout   . Hypothyroidism   . IDA (iron deficiency anemia) 01/16/2020  . MI, old   . Thyroid disease     Past Surgical History:  Procedure Laterality Date  . CARDIAC CATHETERIZATION    . CESAREAN SECTION    . COLONOSCOPY WITH PROPOFOL N/A 03/16/2018   Procedure: COLONOSCOPY WITH PROPOFOL;  Surgeon: Virgel Manifold, MD;  Location: [facility] ENDOSCOPY;  Service: Endoscopy;  Laterality: N/A;  . ESOPHAGOGASTRODUODENOSCOPY N/A 10/11/2019   Procedure: ESOPHAGOGASTRODUODENOSCOPY (EGD);  Surgeon: Lin Landsman, MD;  Location: [facility] ENDOSCOPY;  Service: Gastroenterology;  Laterality: N/A;  . ESOPHAGOGASTRODUODENOSCOPY (EGD) WITH PROPOFOL N/A 10/27/2018   Procedure: ESOPHAGOGASTRODUODENOSCOPY (EGD) WITH PROPOFOL;  Surgeon: Virgel Manifold, MD;  Location: [facility] ENDOSCOPY;  Service: Endoscopy;  Laterality: N/A;  . ESOPHAGOGASTRODUODENOSCOPY (EGD) WITH PROPOFOL N/A 06/25/2019   Procedure: ESOPHAGOGASTRODUODENOSCOPY (EGD) WITH PROPOFOL;  Surgeon: Jonathon Bellows, MD;  Location: [facility] ENDOSCOPY;  Service: Gastroenterology;  Laterality: N/A;  . ESOPHAGOGASTRODUODENOSCOPY (EGD) WITH PROPOFOL N/A 09/12/2019   Procedure: ESOPHAGOGASTRODUODENOSCOPY (EGD) WITH PROPOFOL;  Surgeon: Lucilla Lame, MD;  Location: [facility] ENDOSCOPY;  Service: Endoscopy;  Laterality: N/A;  . ESOPHAGOGASTRODUODENOSCOPY (EGD) WITH PROPOFOL N/A 11/20/2019   Procedure: ESOPHAGOGASTRODUODENOSCOPY (EGD) WITH PROPOFOL;  Surgeon:  Virgel Manifold, MD;  Location: [facility] ENDOSCOPY;  Service: Endoscopy;  Laterality: N/A;  . ESOPHAGOGASTRODUODENOSCOPY (EGD) WITH PROPOFOL N/A 11/23/2019   Procedure: ESOPHAGOGASTRODUODENOSCOPY (EGD) WITH PROPOFOL;  Surgeon: Virgel Manifold, MD;  Location: [facility] ENDOSCOPY;  Service: Endoscopy;  Laterality: N/A;  . ESOPHAGOGASTRODUODENOSCOPY (EGD) WITH PROPOFOL N/A 01/03/2020   Procedure: ESOPHAGOGASTRODUODENOSCOPY (EGD) WITH PROPOFOL;  Surgeon: Lin Landsman, MD;  Location: [facility] ENDOSCOPY;  Service: Endoscopy;  Laterality: N/A;  . TUBAL LIGATION      Family History  Problem Relation Age of Onset  . Hypertension Other   . CAD Father   . Colon cancer Father   . Lung cancer Father   . Pancreatic cancer Brother     Social History   Socioeconomic History  . Marital status: Single    Spouse name: Not on file  . Number of children: Not on file  . Years of education: Not on file  . Highest education level: Not on file  Occupational History  . Not on file  Tobacco Use  . Smoking status: Never Smoker  . Smokeless tobacco: Never Used  Vaping Use  . Vaping Use: Never used  Substance and Sexual Activity  . Alcohol use: Yes    Comment: rarely  . Drug use: Never  . Sexual activity: Not Currently  Other Topics Concern  . Not on file  Social History Narrative  . Not on file   Social Determinants of Health   Financial Resource Strain: Medium Risk  . Difficulty of Paying Living Expenses: Somewhat hard  Food Insecurity: No Food Insecurity  . Worried About Charity fundraiser in the Last Year: Never true  .  Ran Out of Food in the Last Year: Never true  Transportation Needs: No Transportation Needs  . Lack of Transportation (Medical): No  . Lack of Transportation (Non-Medical): No  Physical Activity: Inactive  . Days of Exercise per Week: 0 days  . Minutes of Exercise per Session: 0 min  Stress: Stress Concern Present  . Feeling of Stress : Rather much  Social  Connections: Unknown  . Frequency of Communication with Friends and Family: More than three times a week  . Frequency of Social Gatherings with Friends and Family: More than three times a week  . Attends Religious Services: Never  . Active Member of Clubs or Organizations: No  . Attends Archivist Meetings: Not asked  . Marital Status: Not on file  Intimate Partner Violence: Not At Risk  . Fear of Current or Ex-Partner: No  . Emotionally Abused: No  . Physically Abused: No  . Sexually Abused: No     Current Outpatient Medications:  .  aspirin EC 81 MG EC tablet, Take 1 tablet (81 mg total) by mouth daily., Disp: 90 tablet, Rfl: 0 .  cyanocobalamin ([employer] VITAMIN B12) 1000 MCG tablet, Take 1 tablet (1,000 mcg total) by mouth daily., Disp: 90 tablet, Rfl: 1 .  diclofenac Sodium (VOLTAREN) 1 % GEL, Apply 5 g topically 4 (four) times daily., Disp: , Rfl:  .  docusate sodium (COLACE) 100 MG capsule, Take 1 capsule (100 mg total) by mouth 2 (two) times daily., Disp: 60 capsule, Rfl: 2 .  ferrous sulfate 325 (65 FE) MG EC tablet, TAKE 1 TABLET BY MOUTH EVERY DAY WITH BREAKFAST, Disp: 30 tablet, Rfl: 2 .  hydrocortisone (ANUSOL-HC) 25 MG suppository, Place 1 suppository (25 mg total) rectally every 12 (twelve) hours., Disp: 12 suppository, Rfl: 1 .  lactulose (CHRONULAC) 10 GM/15ML solution, TAKE 15 MLS (10 G TOTAL) BY MOUTH DAILY FOR 30 DAYS., Disp: 473 mL, Rfl: 3 .  nitroGLYCERIN (NITROLINGUAL) 0.4 MG/SPRAY spray, Place 1 spray under the tongue every 5 (five) minutes x 3 doses as needed for chest pain., Disp: 12 g, Rfl: 2 .  oxyCODONE-acetaminophen (PERCOCET/ROXICET) 5-325 MG tablet, Take 1 tablet by mouth 4 (four) times daily. , Disp: , Rfl:  .  pantoprazole (PROTONIX) 40 MG tablet, TAKE 1 TABLET BY MOUTH TWICE A DAY, Disp: 60 tablet, Rfl: 1 .  SYNTHROID 100 MCG tablet, TAKE 1 TABLET BY MOUTH DAILY BEFORE BREAKFAST., Disp: 90 tablet, Rfl: 1   Allergies  Allergen Reactions  . Vicodin  [Hydrocodone-Acetaminophen] Rash  . Amoxicillin Rash and Other (See Comments)    Has patient had a PCN reaction causing immediate rash, facial/tongue/throat swelling, SOB or lightheadedness with hypotension: Yes Has patient had a PCN reaction causing severe rash involving mucus membranes or skin necrosis: No Has patient had a PCN reaction that required hospitalization: No Has patient had a PCN reaction occurring within the last 10 years: No If all of the above answers are "NO", then may proceed with Cephalosporin use.   . Gabapentin Rash    ROS Review of Systems  Constitutional: Negative.   HENT: Negative.   Eyes: Negative.   Respiratory: Negative.   Cardiovascular: Negative.   Gastrointestinal: Negative.  Negative for abdominal pain.  Endocrine: Negative.   Genitourinary: Negative.   Musculoskeletal: Negative.   Skin: Negative.   Allergic/Immunologic: Negative.   Neurological: Positive for headaches (last night).  Hematological: Negative.   Psychiatric/Behavioral: Negative.   All other systems reviewed and are negative.  Objective:    Physical Exam Vitals reviewed.  Constitutional:      Appearance: Normal appearance.  HENT:     Mouth/Throat:     Mouth: Mucous membranes are moist.  Eyes:     Pupils: Pupils are equal, round, and reactive to light.  Neck:     Vascular: No carotid bruit.  Cardiovascular:     Rate and Rhythm: Normal rate and regular rhythm.     Pulses: Normal pulses.     Heart sounds: Murmur (systolic murmur, left sternal border) heard.   Pulmonary:     Effort: Pulmonary effort is normal.     Breath sounds: Normal breath sounds.  Abdominal:     General: Bowel sounds are normal.     Palpations: Abdomen is soft. There is no hepatomegaly, splenomegaly or mass.     Tenderness: There is no abdominal tenderness.     Hernia: No hernia is present.  Musculoskeletal:        General: No tenderness.     Cervical back: Neck supple.     Right lower leg:  No edema.     Left lower leg: No edema.  Skin:    Findings: No rash.  Neurological:     Mental Status: She is alert and oriented to person, place, and time.     Motor: No weakness.  Psychiatric:        Mood and Affect: Mood and affect normal.        Behavior: Behavior normal.     BP (!) 137/94   Pulse 78   Ht 4\' 11"  (1.499 m)   Wt 196 lb 9.6 oz (89.2 kg)   LMP  (LMP Unknown) Comment: last menstrual 9 years ago, tubal ligation  BMI 39.71 kg/m  Wt Readings from Last 3 Encounters:  09/06/20 196 lb 9.6 oz (89.2 kg)  08/27/20 194 lb (88 kg)  08/26/20 194 lb 4.8 oz (88.1 kg)     Health Maintenance Due  Topic Date Due  . COVID-19 Vaccine (1) Never done  . TETANUS/TDAP  Never done  . PAP SMEAR-Modifier  Never done  . MAMMOGRAM  Never done  . INFLUENZA VACCINE  Never done    There are no preventive care reminders to display for this patient.  Lab Results  Component Value Date   TSH 0.02 (L) 04/19/2020   Lab Results  Component Value Date   WBC 2.5 (L) 08/26/2020   HGB 11.9 08/26/2020   HCT 35.9 08/26/2020   MCV 91.8 08/26/2020   PLT 45 (L) 08/26/2020   Lab Results  Component Value Date   NA 140 08/26/2020   K 3.9 08/26/2020   CO2 24 08/26/2020   GLUCOSE 96 08/26/2020   BUN 8 08/26/2020   CREATININE 0.52 08/26/2020   BILITOT 1.3 (H) 08/26/2020   ALKPHOS 119 05/30/2020   AST 36 (H) 08/26/2020   ALT 18 08/26/2020   PROT 6.9 08/26/2020   ALBUMIN 3.5 05/30/2020   CALCIUM 8.5 (L) 08/26/2020   ANIONGAP 6 05/30/2020   Lab Results  Component Value Date   CHOL 107 12/29/2017   Lab Results  Component Value Date   HDL 21 (L) 08/25/2013   Lab Results  Component Value Date   LDLCALC 67 08/25/2013   Lab Results  Component Value Date   TRIG 107 08/25/2013   No results found for: [facility] Lab Results  Component Value Date   HGBA1C 4.9 12/29/2017      Assessment & Plan:   Problem  List Items Addressed This Visit      Cardiovascular and Mediastinum    Essential hypertension   Portal hypertension ([place])    Stable  Pt dont drink  Or smoke        Digestive   Other cirrhosis of liver (HCC)    stable        Other   Thrombocytopenia ([place])    resolved      Obesity (BMI 35.0-39.9 without comorbidity) - Primary    - I encouraged the patient to lose weight.  - I educated them on making healthy dietary choices including eating more fruits and vegetables and less fried foods. - I encouraged the patient to exercise more, and educated on the benefits of exercise including weight loss, diabetes prevention, and hypertension prevention.        RLQ abdominal pain    Pt is not tender   In rt LQ         No orders of the defined types were placed in this encounter.   Follow-up: Return in about 3 months (around 12/06/2020).    By signing my name below, I, [employer], attest that this documentation has been prepared under the direction and in the presence of Cletis Athens, MD. Electronically Signed: De Burrs, Medical Scribe. 09/06/20. 2:25 PM.  I personally performed the services described in this documentation, which was SCRIBED in my presence. The recorded information has been reviewed and considered accurate. It has been edited as necessary during review. Cletis Athens, MD

## 2020-09-06 NOTE — Assessment & Plan Note (Signed)
resolved 

## 2020-09-06 NOTE — Assessment & Plan Note (Signed)
Pt is not tender   In rt LQ

## 2020-09-24 ENCOUNTER — Other Ambulatory Visit: Payer: Self-pay

## 2020-09-24 DIAGNOSIS — E039 Hypothyroidism, unspecified: Secondary | ICD-10-CM

## 2020-09-24 MED ORDER — LEVOTHYROXINE SODIUM 100 MCG PO TABS: ORAL_TABLET | ORAL | 1 refills | Status: DC

## 2020-10-08 ENCOUNTER — Ambulatory Visit
Admission: RE | Admit: 2020-10-08 | Discharge: 2020-10-08 | Disposition: A | Discharge status: Home / Self Care | Payer: Medicaid Other | Source: Ambulatory Visit | Attending: Physician Assistant | Admitting: Physician Assistant

## 2020-10-08 ENCOUNTER — Ambulatory Visit
Admission: RE | Admit: 2020-10-08 | Discharge: 2020-10-08 | Disposition: A | Discharge status: Home / Self Care | Payer: Medicaid Other | Attending: Physician Assistant | Admitting: Physician Assistant

## 2020-10-08 ENCOUNTER — Other Ambulatory Visit: Payer: Self-pay

## 2020-10-08 ENCOUNTER — Other Ambulatory Visit: Payer: Self-pay | Admitting: Physician Assistant

## 2020-10-08 DIAGNOSIS — M1711 Unilateral primary osteoarthritis, right knee: Secondary | ICD-10-CM | POA: Diagnosis not present

## 2020-10-08 DIAGNOSIS — M1712 Unilateral primary osteoarthritis, left knee: Secondary | ICD-10-CM | POA: Diagnosis not present

## 2020-10-08 DIAGNOSIS — M542 Cervicalgia: Secondary | ICD-10-CM

## 2020-10-08 DIAGNOSIS — R52 Pain, unspecified: Secondary | ICD-10-CM

## 2020-10-08 DIAGNOSIS — M25762 Osteophyte, left knee: Secondary | ICD-10-CM | POA: Diagnosis not present

## 2020-10-08 DIAGNOSIS — M2342 Loose body in knee, left knee: Secondary | ICD-10-CM | POA: Diagnosis not present

## 2020-10-08 DIAGNOSIS — M545 Low back pain, unspecified: Secondary | ICD-10-CM | POA: Diagnosis not present

## 2020-10-09 DIAGNOSIS — G894 Chronic pain syndrome: Secondary | ICD-10-CM | POA: Diagnosis not present

## 2020-10-09 DIAGNOSIS — Z79891 Long term (current) use of opiate analgesic: Secondary | ICD-10-CM | POA: Diagnosis not present

## 2020-10-09 DIAGNOSIS — M5412 Radiculopathy, cervical region: Secondary | ICD-10-CM | POA: Diagnosis not present

## 2020-10-09 DIAGNOSIS — M542 Cervicalgia: Secondary | ICD-10-CM | POA: Diagnosis not present

## 2020-10-11 ENCOUNTER — Ambulatory Visit (INDEPENDENT_AMBULATORY_CARE_PROVIDER_SITE_OTHER): Payer: Medicaid Other | Admitting: Family Medicine

## 2020-10-11 ENCOUNTER — Other Ambulatory Visit: Payer: Self-pay

## 2020-10-11 ENCOUNTER — Encounter: Payer: Self-pay | Admitting: Family Medicine

## 2020-10-11 VITALS — BP 135/82 | HR 78 | Ht <= 58 in | Wt 201.2 lb

## 2020-10-11 DIAGNOSIS — I10 Essential (primary) hypertension: Secondary | ICD-10-CM

## 2020-10-11 DIAGNOSIS — Z1239 Encounter for other screening for malignant neoplasm of breast: Secondary | ICD-10-CM | POA: Diagnosis not present

## 2020-10-11 DIAGNOSIS — Z0000 Encounter for general adult medical examination without abnormal findings: Secondary | ICD-10-CM | POA: Diagnosis not present

## 2020-10-11 DIAGNOSIS — Z6841 Body Mass Index (BMI) 40.0 and over, adult: Secondary | ICD-10-CM

## 2020-10-11 NOTE — Assessment & Plan Note (Signed)
Mammogram ordered

## 2020-10-11 NOTE — Assessment & Plan Note (Addendum)
Mammo- Scheduled Today Colonoscopy- 2020 Eye Exam- 2019 Will schedule on.  Flu Vaccine- Declined Pap Exam- 2014 Will send to [facility]- Does not recall will do this next visit.   In general reports good health, taking all meds as rx.

## 2020-10-11 NOTE — Assessment & Plan Note (Signed)
Discussed diet and carb control with moderate exercise. Goal of 1 lb per week is our goal now.

## 2020-10-11 NOTE — Assessment & Plan Note (Signed)
Well controlled today, continue current therapy.

## 2020-10-11 NOTE — Progress Notes (Signed)
Established Patient Office Visit  SUBJECTIVE:  Subjective  Patient ID: Brandi Bright, female    DOB: 01-13-1971  Age: 49 y.o. MRN: [id number]  CC:  Chief Complaint  Patient presents with  . Annual Exam    HPI Brandi Bright is a 49 y.o. female presenting today for annual exam, no acute complaints today.   Past Medical History:  Diagnosis Date  . Arthritis   . Back pain   . Gout   . Hypothyroidism   . IDA (iron deficiency anemia) 01/16/2020  . MI, old   . Thyroid disease     Past Surgical History:  Procedure Laterality Date  . CARDIAC CATHETERIZATION    . CESAREAN SECTION    . COLONOSCOPY WITH PROPOFOL N/A 03/16/2018   Procedure: COLONOSCOPY WITH PROPOFOL;  Surgeon: Virgel Manifold, MD;  Location: [facility] ENDOSCOPY;  Service: Endoscopy;  Laterality: N/A;  . ESOPHAGOGASTRODUODENOSCOPY N/A 10/11/2019   Procedure: ESOPHAGOGASTRODUODENOSCOPY (EGD);  Surgeon: Lin Landsman, MD;  Location: [facility] ENDOSCOPY;  Service: Gastroenterology;  Laterality: N/A;  . ESOPHAGOGASTRODUODENOSCOPY (EGD) WITH PROPOFOL N/A 10/27/2018   Procedure: ESOPHAGOGASTRODUODENOSCOPY (EGD) WITH PROPOFOL;  Surgeon: Virgel Manifold, MD;  Location: [facility] ENDOSCOPY;  Service: Endoscopy;  Laterality: N/A;  . ESOPHAGOGASTRODUODENOSCOPY (EGD) WITH PROPOFOL N/A 06/25/2019   Procedure: ESOPHAGOGASTRODUODENOSCOPY (EGD) WITH PROPOFOL;  Surgeon: Jonathon Bellows, MD;  Location: [facility] ENDOSCOPY;  Service: Gastroenterology;  Laterality: N/A;  . ESOPHAGOGASTRODUODENOSCOPY (EGD) WITH PROPOFOL N/A 09/12/2019   Procedure: ESOPHAGOGASTRODUODENOSCOPY (EGD) WITH PROPOFOL;  Surgeon: Lucilla Lame, MD;  Location: [facility] ENDOSCOPY;  Service: Endoscopy;  Laterality: N/A;  . ESOPHAGOGASTRODUODENOSCOPY (EGD) WITH PROPOFOL N/A 11/20/2019   Procedure: ESOPHAGOGASTRODUODENOSCOPY (EGD) WITH PROPOFOL;  Surgeon: Virgel Manifold, MD;  Location: [facility] ENDOSCOPY;  Service: Endoscopy;  Laterality: N/A;  . ESOPHAGOGASTRODUODENOSCOPY (EGD) WITH  PROPOFOL N/A 11/23/2019   Procedure: ESOPHAGOGASTRODUODENOSCOPY (EGD) WITH PROPOFOL;  Surgeon: Virgel Manifold, MD;  Location: [facility] ENDOSCOPY;  Service: Endoscopy;  Laterality: N/A;  . ESOPHAGOGASTRODUODENOSCOPY (EGD) WITH PROPOFOL N/A 01/03/2020   Procedure: ESOPHAGOGASTRODUODENOSCOPY (EGD) WITH PROPOFOL;  Surgeon: Lin Landsman, MD;  Location: [facility] ENDOSCOPY;  Service: Endoscopy;  Laterality: N/A;  . TUBAL LIGATION      Family History  Problem Relation Age of Onset  . Hypertension Other   . CAD Father   . Colon cancer Father   . Lung cancer Father   . Pancreatic cancer Brother     Social History   Socioeconomic History  . Marital status: Single    Spouse name: Not on file  . Number of children: Not on file  . Years of education: Not on file  . Highest education level: Not on file  Occupational History  . Not on file  Tobacco Use  . Smoking status: Never Smoker  . Smokeless tobacco: Never Used  Vaping Use  . Vaping Use: Never used  Substance and Sexual Activity  . Alcohol use: Yes    Comment: rarely  . Drug use: Never  . Sexual activity: Not Currently  Other Topics Concern  . Not on file  Social History Narrative  . Not on file   Social Determinants of Health   Financial Resource Strain:   . Difficulty of Paying Living Expenses: Not on file  Food Insecurity:   . Worried About Charity fundraiser in the Last Year: Not on file  . Ran Out of Food in the Last Year: Not on file  Transportation Needs:   . Lack of Transportation (Medical): Not on file  .  Lack of Transportation (Non-Medical): Not on file  Physical Activity:   . Days of Exercise per Week: Not on file  . Minutes of Exercise per Session: Not on file  Stress:   . Feeling of Stress : Not on file  Social Connections:   . Frequency of Communication with Friends and Family: Not on file  . Frequency of Social Gatherings with Friends and Family: Not on file  . Attends Religious Services: Not on  file  . Active Member of Clubs or Organizations: Not on file  . Attends Archivist Meetings: Not on file  . Marital Status: Not on file  Intimate Partner Violence:   . Fear of Current or Ex-Partner: Not on file  . Emotionally Abused: Not on file  . Physically Abused: Not on file  . Sexually Abused: Not on file     Current Outpatient Medications:  .  aspirin EC 81 MG EC tablet, Take 1 tablet (81 mg total) by mouth daily., Disp: 90 tablet, Rfl: 0 .  cyanocobalamin ([employer] VITAMIN B12) 1000 MCG tablet, Take 1 tablet (1,000 mcg total) by mouth daily., Disp: 90 tablet, Rfl: 1 .  diclofenac Sodium (VOLTAREN) 1 % GEL, Apply 5 g topically 4 (four) times daily., Disp: , Rfl:  .  docusate sodium (COLACE) 100 MG capsule, Take 1 capsule (100 mg total) by mouth 2 (two) times daily., Disp: 60 capsule, Rfl: 2 .  ferrous sulfate 325 (65 FE) MG EC tablet, TAKE 1 TABLET BY MOUTH EVERY DAY WITH BREAKFAST, Disp: 30 tablet, Rfl: 2 .  hydrocortisone (ANUSOL-HC) 25 MG suppository, Place 1 suppository (25 mg total) rectally every 12 (twelve) hours., Disp: 12 suppository, Rfl: 1 .  lactulose (CHRONULAC) 10 GM/15ML solution, TAKE 15 MLS (10 G TOTAL) BY MOUTH DAILY FOR 30 DAYS., Disp: 473 mL, Rfl: 3 .  levothyroxine (SYNTHROID) 100 MCG tablet, TAKE 1 TABLET BY MOUTH DAILY BEFORE BREAKFAST., Disp: 90 tablet, Rfl: 1 .  nitroGLYCERIN (NITROLINGUAL) 0.4 MG/SPRAY spray, Place 1 spray under the tongue every 5 (five) minutes x 3 doses as needed for chest pain., Disp: 12 g, Rfl: 2 .  oxyCODONE-acetaminophen (PERCOCET/ROXICET) 5-325 MG tablet, Take 1 tablet by mouth 4 (four) times daily. , Disp: , Rfl:  .  pantoprazole (PROTONIX) 40 MG tablet, TAKE 1 TABLET BY MOUTH TWICE A DAY, Disp: 60 tablet, Rfl: 1   Allergies  Allergen Reactions  . Vicodin [Hydrocodone-Acetaminophen] Rash  . Amoxicillin Rash and Other (See Comments)    Has patient had a PCN reaction causing immediate rash, facial/tongue/throat swelling, SOB or  lightheadedness with hypotension: Yes Has patient had a PCN reaction causing severe rash involving mucus membranes or skin necrosis: No Has patient had a PCN reaction that required hospitalization: No Has patient had a PCN reaction occurring within the last 10 years: No If all of the above answers are "NO", then may proceed with Cephalosporin use.   . Gabapentin Rash    ROS Review of Systems  Constitutional: Negative.   HENT: Positive for congestion and ear pain.   Eyes: Negative.   Respiratory: Negative.   Cardiovascular: Negative.   Endocrine: Negative.   Genitourinary: Negative.   Musculoskeletal: Positive for arthralgias and joint swelling.  Skin: Negative.   Neurological: Negative.   Psychiatric/Behavioral: Negative.      OBJECTIVE:    Physical Exam Constitutional:      Appearance: She is normal weight.  HENT:     Head: Normocephalic.     Right Ear: Tympanic membrane  normal.     Left Ear: Tympanic membrane normal.     Mouth/Throat:     Mouth: Mucous membranes are moist.  Cardiovascular:     Rate and Rhythm: Normal rate.  Musculoskeletal:        General: Normal range of motion.  Skin:    General: Skin is warm.  Neurological:     Mental Status: She is alert.     BP 135/82   Pulse 78   Ht 4\' 9"  (1.448 m)   Wt 201 lb 3.2 oz (91.3 kg)   LMP  (LMP Unknown) Comment: last menstrual 9 years ago, tubal ligation  BMI 43.54 kg/m  Wt Readings from Last 3 Encounters:  10/11/20 201 lb 3.2 oz (91.3 kg)  09/06/20 196 lb 9.6 oz (89.2 kg)  08/27/20 194 lb (88 kg)    Health Maintenance Due  Topic Date Due  . COVID-19 Vaccine (1) Never done  . TETANUS/TDAP  Never done  . MAMMOGRAM  Never done    There are no preventive care reminders to display for this patient.  CBC Latest Ref Rng & Units 08/26/2020 05/30/2020 04/19/2020  WBC 3.8 - 10.8 Thousand/uL 2.5(L) 3.6(L) 2.5(L)  Hemoglobin 11.7 - 15.5 g/dL 11.9 13.1 12.7  Hematocrit 35 - 45 % 35.9 37.9 38.0  Platelets 140  - 400 Thousand/uL 45(L) 51(L) 42(L)   CMP Latest Ref Rng & Units 08/26/2020 05/30/2020 04/19/2020  Glucose 65 - 99 mg/dL 96 110(H) 95  BUN 7 - 25 mg/dL 8 10 10   Creatinine 0.50 - 1.05 mg/dL 0.52 0.56 0.38(L)  Sodium 135 - 146 mmol/L 140 141 141  Potassium 3.5 - 5.3 mmol/L 3.9 3.6 4.0  Chloride 98 - 110 mmol/L 109 111 110  CO2 20 - 32 mmol/L 24 24 23   Calcium 8.6 - 10.4 mg/dL 8.5(L) 8.8(L) 8.5(L)  Total Protein 6.1 - 8.1 g/dL 6.9 6.9 6.2  Total Bilirubin 0.2 - 1.2 mg/dL 1.3(H) 1.6(H) 1.0  Alkaline Phos 38 - 126 U/L - 119 -  AST 10 - 35 U/L 36(H) 40 42(H)  ALT 6 - 29 U/L 18 28 22     Lab Results  Component Value Date   TSH 0.02 (L) 04/19/2020   Lab Results  Component Value Date   ALBUMIN 3.5 05/30/2020   ANIONGAP 6 05/30/2020   Lab Results  Component Value Date   CHOL 107 12/29/2017   CHOL 109 08/25/2013   CHOL 84 07/03/2012   HDL 21 (L) 08/25/2013   HDL 18 (L) 07/03/2012   HDL 24 (L) 04/08/2012   LDLCALC 67 08/25/2013   LDLCALC 40 07/03/2012   LDLCALC 56 04/08/2012   Lab Results  Component Value Date   TRIG 107 08/25/2013   Lab Results  Component Value Date   HGBA1C 4.9 12/29/2017      ASSESSMENT & PLAN:   Problem List Items Addressed This Visit      Cardiovascular and Mediastinum   Essential hypertension    Well controlled today, continue current therapy.         Other   Annual physical exam - Primary    Mammo- Scheduled Today Colonoscopy- 2020 Eye Exam- 2019 Will schedule on.  Flu Vaccine- Declined Pap Exam- 2014 Will send to [facility] TD[facility]- Does not recall will do this next visit.   In general reports good health, taking all meds as rx.       Relevant Orders   Ambulatory referral to Obstetrics / Gynecology   Encounter for screening for malignant  neoplasm of breast   Relevant Orders   MM 3D SCREEN BREAST BILATERAL   Class 3 severe obesity due to excess calories with serious comorbidity and body mass index (BMI) of 40.0 to 44.9 in adult [facility])     Discussed diet and carb control with moderate exercise. Goal of 1 lb per week is our goal now.          No orders of the defined types were placed in this encounter.     Follow-up: No follow-ups on file.    Beckie Salts, [place] [address] [place], [place] [address]

## 2020-10-22 ENCOUNTER — Telehealth: Payer: Self-pay

## 2020-10-22 ENCOUNTER — Encounter: Payer: Self-pay | Admitting: Oncology

## 2020-10-22 ENCOUNTER — Inpatient Hospital Stay: Payer: Medicaid Other | Attending: Oncology

## 2020-10-22 ENCOUNTER — Inpatient Hospital Stay (HOSPITAL_BASED_OUTPATIENT_CLINIC_OR_DEPARTMENT_OTHER): Payer: Medicaid Other | Admitting: Oncology

## 2020-10-22 VITALS — BP 132/95 | HR 72 | Temp 98.2°F | Resp 18 | Wt 205.5 lb

## 2020-10-22 DIAGNOSIS — D696 Thrombocytopenia, unspecified: Secondary | ICD-10-CM | POA: Diagnosis not present

## 2020-10-22 DIAGNOSIS — D72819 Decreased white blood cell count, unspecified: Secondary | ICD-10-CM | POA: Diagnosis not present

## 2020-10-22 DIAGNOSIS — Z800 Family history of malignant neoplasm of digestive organs: Secondary | ICD-10-CM | POA: Diagnosis not present

## 2020-10-22 DIAGNOSIS — K7460 Unspecified cirrhosis of liver: Secondary | ICD-10-CM | POA: Insufficient documentation

## 2020-10-22 DIAGNOSIS — Z79899 Other long term (current) drug therapy: Secondary | ICD-10-CM | POA: Insufficient documentation

## 2020-10-22 DIAGNOSIS — D500 Iron deficiency anemia secondary to blood loss (chronic): Secondary | ICD-10-CM

## 2020-10-22 DIAGNOSIS — Z801 Family history of malignant neoplasm of trachea, bronchus and lung: Secondary | ICD-10-CM | POA: Insufficient documentation

## 2020-10-22 DIAGNOSIS — E538 Deficiency of other specified B group vitamins: Secondary | ICD-10-CM | POA: Insufficient documentation

## 2020-10-22 DIAGNOSIS — R161 Splenomegaly, not elsewhere classified: Secondary | ICD-10-CM | POA: Diagnosis not present

## 2020-10-22 DIAGNOSIS — R790 Abnormal level of blood mineral: Secondary | ICD-10-CM

## 2020-10-22 LAB — CBC WITH DIFFERENTIAL/PLATELET
Abs Immature Granulocytes: 0.01 10*3/uL (ref 0.00–0.07)
Basophils Absolute: 0 10*3/uL (ref 0.0–0.1)
Basophils Relative: 1 %
Eosinophils Absolute: 0.1 10*3/uL (ref 0.0–0.5)
Eosinophils Relative: 3 %
HCT: 34.4 % — ABNORMAL LOW (ref 36.0–46.0)
Hemoglobin: 11.6 g/dL — ABNORMAL LOW (ref 12.0–15.0)
Immature Granulocytes: 1 %
Lymphocytes Relative: 35 %
Lymphs Abs: 0.6 10*3/uL — ABNORMAL LOW (ref 0.7–4.0)
MCH: 31.1 pg (ref 26.0–34.0)
MCHC: 33.7 g/dL (ref 30.0–36.0)
MCV: 92.2 fL (ref 80.0–100.0)
Monocytes Absolute: 0.1 10*3/uL (ref 0.1–1.0)
Monocytes Relative: 7 %
Neutro Abs: 1 10*3/uL — ABNORMAL LOW (ref 1.7–7.7)
Neutrophils Relative %: 53 %
Platelets: 33 10*3/uL — ABNORMAL LOW (ref 150–400)
RBC: 3.73 MIL/uL — ABNORMAL LOW (ref 3.87–5.11)
RDW: 14.2 % (ref 11.5–15.5)
WBC: 1.8 10*3/uL — ABNORMAL LOW (ref 4.0–10.5)
nRBC: 0 % (ref 0.0–0.2)

## 2020-10-22 LAB — COMPREHENSIVE METABOLIC PANEL
ALT: 23 U/L (ref 0–44)
AST: 40 U/L (ref 15–41)
Albumin: 3.5 g/dL (ref 3.5–5.0)
Alkaline Phosphatase: 95 U/L (ref 38–126)
Anion gap: 7 (ref 5–15)
BUN: 11 mg/dL (ref 6–20)
CO2: 25 mmol/L (ref 22–32)
Calcium: 8.6 mg/dL — ABNORMAL LOW (ref 8.9–10.3)
Chloride: 110 mmol/L (ref 98–111)
Creatinine, Ser: 0.67 mg/dL (ref 0.44–1.00)
GFR, Estimated: >60 mL/min (ref >60)
Glucose, Bld: 104 mg/dL — ABNORMAL HIGH (ref 70–99)
Potassium: 4 mmol/L (ref 3.5–5.1)
Sodium: 142 mmol/L (ref 135–145)
Total Bilirubin: 1.1 mg/dL (ref 0.3–1.2)
Total Protein: 6.8 g/dL (ref 6.5–8.1)

## 2020-10-22 LAB — IRON AND TIBC
Iron: 123 ug/dL (ref 28–170)
Saturation Ratios: 28 % (ref 10.4–31.8)
TIBC: 437 ug/dL (ref 250–450)
UIBC: 314 ug/dL

## 2020-10-22 LAB — VITAMIN B12: Vitamin B-12: 218 pg/mL (ref 180–914)

## 2020-10-22 LAB — FOLATE: Folate: 12.3 ng/mL (ref >5.9)

## 2020-10-22 LAB — FERRITIN: Ferritin: 15 ng/mL (ref 11–307)

## 2020-10-22 MED ORDER — CYANOCOBALAMIN 1000 MCG PO TABS: 1000.0000 ug | ORAL_TABLET | Freq: Every day | ORAL | 1 refills | Status: DC

## 2020-10-22 NOTE — Telephone Encounter (Signed)
-----   Message from Earlie Server, MD sent at 10/22/2020  4:01 PM EST ----- Please let her know that iron panel has slightly decreased and recommend patient to get 1 dose of IV Venofer treatment as maintenance.  B12 level has also decreased and recommend her to continue take vitamin B12 supplementation.  I refilled her B12 prescription.

## 2020-10-22 NOTE — Progress Notes (Signed)
Pt here for follow up. No new concerns voiced.   

## 2020-10-22 NOTE — Progress Notes (Signed)
[facility] day:  10/22/2020  Chief Complaint: Brandi Bright is a 49 y.o. female follows up for management of iron deficiency anemia, thrombocytopenia, and leukopenia   PERTINENT HEMATOLOGY HISTORY Patient follows up with Dr. Mike Gip previously.  Establish care with me on 02/09/2019. Reviewed patient's previous medical records, labs, imaging results. # History of cirrhosis and hepatitis C.  She has a history of chronic anemia, thrombocytopenia and leukopenia dating back to 03/2012.  Platelet count has fluctuated between 54,000 - 56,000 since 12/28/2017 (previously 73,000 - 134,000).  Ferritin was 14 on 08/31/2018.    Work-up on 09/23/2018 revealed a hematocrit of 31.0, hemoglobin 10.1, MCV 87.6, platelets 46,000, WBC 1900 with an ANC of 1100.  Ferritin was 10 (low) with iron saturation 16% with a TIBC of 401.  Retic was 1.2%.  B12 was 315.  Normal studies included: folate, SPEP, and free light chain ratio.  Copper was 69 (72-166).  ANA was + with double stranded DNA antibody 13 (0-9).  TSH was 0.036 (0.35-4.5) with a free T4 1.05 (0.82-1.77).  Peripheral smear revealed variant lymphocytes.  Ferritin has been followed:  14 on 08/31/2018 and 10 on 09/23/2018.  Abdomen and pelvic CT on 07/14/2018 revealed cirrhosis with portal hypertension noted by prominent splenomegaly (18.5 x 13.8 x 8.1 cm; volume 1100 cm3), enlarged portal veins with recannulated umbilical vein, paraesophageal and perigastric varices.  There was mild thickening of the cecum and ascending colon.  EGD on 10/27/2018 revealed grade II esophageal varices.  There was erythematous mucosa in the antrum.  There was congested, erythematous, friable (with contact bleeding), granular and nodular mucosa in the gastric fundus and astric body.  There was a single gastric polyp (polypoid ulcerated antral type mucos with chronic active mucosal inflammation).  There was a normal duodenal bulb, second  portion of the duodenum and examined duodenum.  The patient's iron deficiency could be explained by her friable gastric mucosa (likely from portal hypertension).  There was no history of variceal bleeding and thus this is not a cause of her iron deficiency.  # admitted from 10/11/2019 to 10/12/2019 due to rectal bleeding EGD 10/12/2019 showed portal hypertensive gastropathy, treated with APC.  Nonbleeding large esophageal varices incompletely evaluated.  Banded. Patient follows with gastroenterology and had EGD on 11/20/2019. Banding was not done due to large amount of food in the stomach. There was plan for EGD on 11/23/2019.  Her gastroenterologist Dr. Bonna Gains contacted me to see if patient can get platelet transfusion to improve her platelet counts for banding on 11/23/2019.     INTERVAL HISTORY Brandi Bright is a 49 y.o. female who has above history reviewed by me today presents for follow up visit for management of thrombocytopenia, splenomegaly, leukopenia and anemia. Problems and complaints are listed below: Positive for easy bruising chronic fatigue.  She reports some intermittent hemorrhoidal bleeding.  Review of Systems  Constitutional: Positive for fatigue. Negative for appetite change, chills and fever.  HENT:   Negative for hearing loss and voice change.   Eyes: Negative for eye problems.  Respiratory: Negative for chest tightness and cough.   Cardiovascular: Negative for chest pain.  Gastrointestinal: Negative for abdominal distention, abdominal pain and blood in stool.  Endocrine: Negative for hot flashes.  Genitourinary: Negative for difficulty urinating and frequency.   Musculoskeletal: Negative for arthralgias.  Skin: Negative for itching and rash.  Neurological: Negative for extremity weakness.  Hematological: Negative for adenopathy. Bruises/bleeds easily.  Psychiatric/Behavioral: Negative  for confusion.   Past Medical History:  Diagnosis Date  . Arthritis   .  Back pain   . Gout   . Hypothyroidism   . IDA (iron deficiency anemia) 01/16/2020  . MI, old   . Thyroid disease     Past Surgical History:  Procedure Laterality Date  . CARDIAC CATHETERIZATION    . CESAREAN SECTION    . COLONOSCOPY WITH PROPOFOL N/A 03/16/2018   Procedure: COLONOSCOPY WITH PROPOFOL;  Surgeon: Virgel Manifold, MD;  Location: [facility] ENDOSCOPY;  Service: Endoscopy;  Laterality: N/A;  . ESOPHAGOGASTRODUODENOSCOPY N/A 10/11/2019   Procedure: ESOPHAGOGASTRODUODENOSCOPY (EGD);  Surgeon: Lin Landsman, MD;  Location: [facility] ENDOSCOPY;  Service: Gastroenterology;  Laterality: N/A;  . ESOPHAGOGASTRODUODENOSCOPY (EGD) WITH PROPOFOL N/A 10/27/2018   Procedure: ESOPHAGOGASTRODUODENOSCOPY (EGD) WITH PROPOFOL;  Surgeon: Virgel Manifold, MD;  Location: [facility] ENDOSCOPY;  Service: Endoscopy;  Laterality: N/A;  . ESOPHAGOGASTRODUODENOSCOPY (EGD) WITH PROPOFOL N/A 06/25/2019   Procedure: ESOPHAGOGASTRODUODENOSCOPY (EGD) WITH PROPOFOL;  Surgeon: Jonathon Bellows, MD;  Location: [facility] ENDOSCOPY;  Service: Gastroenterology;  Laterality: N/A;  . ESOPHAGOGASTRODUODENOSCOPY (EGD) WITH PROPOFOL N/A 09/12/2019   Procedure: ESOPHAGOGASTRODUODENOSCOPY (EGD) WITH PROPOFOL;  Surgeon: Lucilla Lame, MD;  Location: [facility] ENDOSCOPY;  Service: Endoscopy;  Laterality: N/A;  . ESOPHAGOGASTRODUODENOSCOPY (EGD) WITH PROPOFOL N/A 11/20/2019   Procedure: ESOPHAGOGASTRODUODENOSCOPY (EGD) WITH PROPOFOL;  Surgeon: Virgel Manifold, MD;  Location: [facility] ENDOSCOPY;  Service: Endoscopy;  Laterality: N/A;  . ESOPHAGOGASTRODUODENOSCOPY (EGD) WITH PROPOFOL N/A 11/23/2019   Procedure: ESOPHAGOGASTRODUODENOSCOPY (EGD) WITH PROPOFOL;  Surgeon: Virgel Manifold, MD;  Location: [facility] ENDOSCOPY;  Service: Endoscopy;  Laterality: N/A;  . ESOPHAGOGASTRODUODENOSCOPY (EGD) WITH PROPOFOL N/A 01/03/2020   Procedure: ESOPHAGOGASTRODUODENOSCOPY (EGD) WITH PROPOFOL;  Surgeon: Lin Landsman, MD;  Location: [facility] ENDOSCOPY;   Service: Endoscopy;  Laterality: N/A;  . TUBAL LIGATION      Family History  Problem Relation Age of Onset  . Hypertension Other   . CAD Father   . Colon cancer Father   . Lung cancer Father   . Pancreatic cancer Brother    Social History   Socioeconomic History  . Marital status: Single    Spouse name: Not on file  . Number of children: Not on file  . Years of education: Not on file  . Highest education level: Not on file  Occupational History  . Not on file  Tobacco Use  . Smoking status: Never Smoker  . Smokeless tobacco: Never Used  Vaping Use  . Vaping Use: Never used  Substance and Sexual Activity  . Alcohol use: Yes    Comment: rarely  . Drug use: Never  . Sexual activity: Not Currently  Other Topics Concern  . Not on file  Social History Narrative  . Not on file   Social Determinants of Health   Financial Resource Strain:   . Difficulty of Paying Living Expenses: Not on file  Food Insecurity:   . Worried About Charity fundraiser in the Last Year: Not on file  . Ran Out of Food in the Last Year: Not on file  Transportation Needs:   . Lack of Transportation (Medical): Not on file  . Lack of Transportation (Non-Medical): Not on file  Physical Activity:   . Days of Exercise per Week: Not on file  . Minutes of Exercise per Session: Not on file  Stress:   . Feeling of Stress : Not on file  Social Connections:   . Frequency of Communication with Friends and Family: Not on  file  . Frequency of Social Gatherings with Friends and Family: Not on file  . Attends Religious Services: Not on file  . Active Member of Clubs or Organizations: Not on file  . Attends Archivist Meetings: Not on file  . Marital Status: Not on file  Intimate Partner Violence:   . Fear of Current or Ex-Partner: Not on file  . Emotionally Abused: Not on file  . Physically Abused: Not on file  . Sexually Abused: Not on file    Allergies:  Allergies  Allergen Reactions  .  Vicodin [Hydrocodone-Acetaminophen] Rash  . Amoxicillin Rash and Other (See Comments)    Has patient had a PCN reaction causing immediate rash, facial/tongue/throat swelling, SOB or lightheadedness with hypotension: Yes Has patient had a PCN reaction causing severe rash involving mucus membranes or skin necrosis: No Has patient had a PCN reaction that required hospitalization: No Has patient had a PCN reaction occurring within the last 10 years: No If all of the above answers are "NO", then may proceed with Cephalosporin use.   . Gabapentin Rash    Current Medications: Current Outpatient Medications  Medication Sig Dispense Refill  . aspirin EC 81 MG EC tablet Take 1 tablet (81 mg total) by mouth daily. 90 tablet 0  . cyanocobalamin ([employer] VITAMIN B12) 1000 MCG tablet Take 1 tablet (1,000 mcg total) by mouth daily. 90 tablet 1  . cyclobenzaprine (FLEXERIL) 10 MG tablet Take 10 mg by mouth daily as needed.    . docusate sodium (COLACE) 100 MG capsule Take 1 capsule (100 mg total) by mouth 2 (two) times daily. 60 capsule 2  . ferrous sulfate 325 (65 FE) MG EC tablet TAKE 1 TABLET BY MOUTH EVERY DAY WITH BREAKFAST 30 tablet 2  . lactulose (CHRONULAC) 10 GM/15ML solution TAKE 15 MLS (10 G TOTAL) BY MOUTH DAILY FOR 30 DAYS. 473 mL 3  . levothyroxine (SYNTHROID) 100 MCG tablet TAKE 1 TABLET BY MOUTH DAILY BEFORE BREAKFAST. 90 tablet 1  . Oxycodone HCl 10 MG TABS Take 10 mg by mouth 4 (four) times daily as needed.    . pantoprazole (PROTONIX) 40 MG tablet TAKE 1 TABLET BY MOUTH TWICE A DAY 60 tablet 1  . hydrocortisone (ANUSOL-HC) 25 MG suppository Place 1 suppository (25 mg total) rectally every 12 (twelve) hours. (Patient not taking: Reported on 10/22/2020) 12 suppository 1  . nitroGLYCERIN (NITROLINGUAL) 0.4 MG/SPRAY spray Place 1 spray under the tongue every 5 (five) minutes x 3 doses as needed for chest pain. (Patient not taking: Reported on 10/22/2020) 12 g 2  . oxyCODONE-acetaminophen  (PERCOCET/ROXICET) 5-325 MG tablet Take 1 tablet by mouth 4 (four) times daily.  (Patient not taking: Reported on 10/22/2020)     No current facility-administered medications for this visit.     Physical Exam: Blood pressure (!) 132/95, pulse 72, temperature 98.2 F (36.8 C), resp. rate 18, weight 205 lb 8 oz (93.2 kg).   Physical Exam Constitutional:      General: She is not in acute distress.    Appearance: She is not diaphoretic.  HENT:     Head: Normocephalic and atraumatic.     Nose: Nose normal.     Mouth/Throat:     Pharynx: No oropharyngeal exudate.  Eyes:     General: No scleral icterus.    Pupils: Pupils are equal, round, and reactive to light.  Cardiovascular:     Rate and Rhythm: Normal rate and regular rhythm.     Heart  sounds: No murmur heard.   Pulmonary:     Effort: Pulmonary effort is normal. No respiratory distress.     Breath sounds: Normal breath sounds. No rales.  Chest:     Chest wall: No tenderness.  Abdominal:     General: Bowel sounds are normal. There is no distension.     Palpations: Abdomen is soft.     Tenderness: There is no abdominal tenderness.     Comments: Splenomegaly  Musculoskeletal:        General: Normal range of motion.     Cervical back: Normal range of motion and neck supple.  Skin:    General: Skin is warm and dry.     Findings: No erythema.  Neurological:     Mental Status: She is alert and oriented to person, place, and time.     Cranial Nerves: No cranial nerve deficit.     Motor: No abnormal muscle tone.     Coordination: Coordination normal.  Psychiatric:        Mood and Affect: Affect normal.     Appointment on 10/22/2020  Component Date Value Ref Range Status  . Sodium 10/22/2020 142  135 - 145 mmol/L Final  . Potassium 10/22/2020 4.0  3.5 - 5.1 mmol/L Final  . Chloride 10/22/2020 110  98 - 111 mmol/L Final  . CO2 10/22/2020 25  22 - 32 mmol/L Final  . Glucose, Bld 10/22/2020 104* 70 - 99 mg/dL Final   Glucose  reference range applies only to samples taken after fasting for at least 8 hours.  . BUN 10/22/2020 11  6 - 20 mg/dL Final  . Creatinine, Ser 10/22/2020 0.67  0.44 - 1.00 mg/dL Final  . Calcium 10/22/2020 8.6* 8.9 - 10.3 mg/dL Final  . Total Protein 10/22/2020 6.8  6.5 - 8.1 g/dL Final  . Albumin 10/22/2020 3.5  3.5 - 5.0 g/dL Final  . AST 10/22/2020 40  15 - 41 U/L Final  . ALT 10/22/2020 23  0 - 44 U/L Final  . Alkaline Phosphatase 10/22/2020 95  38 - 126 U/L Final  . Total Bilirubin 10/22/2020 1.1  0.3 - 1.2 mg/dL Final  . GFR, Estimated 10/22/2020 >60  >60 mL/min Final   Comment: (NOTE) Calculated using the CKD-EPI Creatinine Equation (2021)   . Anion gap 10/22/2020 7  5 - 15 Final   Performed at [facility], [address] [place], [place] [id number]  . WBC 10/22/2020 1.8* 4.0 - 10.5 K/uL Final  . RBC 10/22/2020 3.73* 3.87 - 5.11 MIL/uL Final  . Hemoglobin 10/22/2020 11.6* 12.0 - 15.0 g/dL Final  . HCT 10/22/2020 34.4* 36 - 46 % Final  . MCV 10/22/2020 92.2  80.0 - 100.0 fL Final  . MCH 10/22/2020 31.1  26.0 - 34.0 pg Final  . MCHC 10/22/2020 33.7  30.0 - 36.0 g/dL Final  . RDW 10/22/2020 14.2  11.5 - 15.5 % Final  . Platelets 10/22/2020 33* 150 - 400 K/uL Final   Comment: SPECIMEN CHECKED FOR CLOTS Immature Platelet Fraction may be clinically indicated, consider ordering this additional test [id number]   . nRBC 10/22/2020 0.0  0.0 - 0.2 % Final  . Neutrophils Relative % 10/22/2020 53  % Final  . Neutro Abs 10/22/2020 1.0* 1.7 - 7.7 K/uL Final  . Lymphocytes Relative 10/22/2020 35  % Final  . Lymphs Abs 10/22/2020 0.6* 0.7 - 4.0 K/uL Final  . Monocytes Relative 10/22/2020 7  % Final  . Monocytes Absolute 10/22/2020 0.1  0.1 - 1.0 K/uL Final  . Eosinophils Relative 10/22/2020 3  % Final  . Eosinophils Absolute 10/22/2020 0.1  0.0 - 0.5 K/uL Final  . Basophils Relative 10/22/2020 1  % Final  . Basophils Absolute 10/22/2020 0.0  0.0 - 0.1 K/uL Final  . Immature  Granulocytes 10/22/2020 1  % Final  . Abs Immature Granulocytes 10/22/2020 0.01  0.00 - 0.07 K/uL Final   Performed at [facility], [address], [place], [place] [address]  . Vitamin B-12 10/22/2020 218  180 - 914 pg/mL Final   Comment: (NOTE) This assay is not validated for testing neonatal or myeloproliferative syndrome specimens for Vitamin B12 levels. Performed at [facility], [place] [address] [place], [place] [address]   . Folate 10/22/2020 12.3  >5.9 ng/mL Final   Performed at [facility], [address] [place], [place] [address]  . Ferritin 10/22/2020 15  11 - 307 ng/mL Final   Performed at [facility], [address] [place], [place] [address]  . Iron 10/22/2020 123  28 - 170 ug/dL Final  . TIBC 10/22/2020 437  250 - 450 ug/dL Final  . Saturation Ratios 10/22/2020 28  10.4 - 31.8 % Final  . UIBC 10/22/2020 314  ug/dL Final   Performed at [facility], [address] [place], [place] [address]    RADIOGRAPHIC STUDIES: I have personally reviewed the radiological images as listed and agreed with the findings in the report. DG Cervical Spine Complete  Result Date: 10/08/2020 CLINICAL DATA:  Chronic neck pain. EXAM: CERVICAL SPINE - COMPLETE 4+ VIEW COMPARISON:  Plain film cervical spine 02/22/2018. FINDINGS: There is no evidence of cervical spine fracture or prevertebral soft tissue swelling. Alignment is normal. No other significant bone abnormalities are identified. IMPRESSION: Negative cervical spine radiographs. Electronically Signed   By: Inge Rise M.D.   On: 10/08/2020 13:44   DG Lumbar Spine Complete  Result Date: 10/08/2020 CLINICAL DATA:  Chronic low back pain.  No known injury. EXAM: LUMBAR SPINE - COMPLETE 4+ VIEW COMPARISON:  None. FINDINGS: There is no evidence of lumbar spine fracture. Alignment is normal. Intervertebral disc spaces are maintained. Very large volume of stool throughout the colon noted.  IMPRESSION: Normal appearing lumbar spine. Large colonic stool burden. Electronically Signed   By: Inge Rise M.D.   On: 10/08/2020 13:43   DG Knee Complete 4 Views Left  Result Date: 10/08/2020 CLINICAL DATA:  Chronic bilateral knee pain. EXAM: LEFT KNEE - COMPLETE 4+ VIEW; RIGHT KNEE - COMPLETE 4+ VIEW COMPARISON:  Plain films of the left knee 02/22/2018. FINDINGS: The patient has advanced bilateral tricompartmental osteoarthritis which is worst in the medial compartments. Joint space narrowing, subchondral sclerosis and osteophytosis about the medial compartment of the left knee have worsened since the prior examination. Again seen is a 1.3 cm loose body in the central aspect of the joint on the left. No comparison for the right knee is available but there is bone-on-bone medial compartment joint space narrowing and tricompartmental osteophytosis. There is no acute bony or joint abnormality the right or left. No chondrocalcinosis or joint effusion. No erosion. IMPRESSION: Advanced bilateral knee osteoarthritis is worst in the medial compartments. Osteoarthritis of the left knee has worsened since the prior exam. Loose body in the left knee is unchanged. No comparison for the right knee is available. Electronically Signed   By: Inge Rise M.D.   On: 10/08/2020 13:39   DG Knee Complete 4  Views Right  Result Date: 10/08/2020 CLINICAL DATA:  Chronic bilateral knee pain. EXAM: LEFT KNEE - COMPLETE 4+ VIEW; RIGHT KNEE - COMPLETE 4+ VIEW COMPARISON:  Plain films of the left knee 02/22/2018. FINDINGS: The patient has advanced bilateral tricompartmental osteoarthritis which is worst in the medial compartments. Joint space narrowing, subchondral sclerosis and osteophytosis about the medial compartment of the left knee have worsened since the prior examination. Again seen is a 1.3 cm loose body in the central aspect of the joint on the left. No comparison for the right knee is available but there is  bone-on-bone medial compartment joint space narrowing and tricompartmental osteophytosis. There is no acute bony or joint abnormality the right or left. No chondrocalcinosis or joint effusion. No erosion. IMPRESSION: Advanced bilateral knee osteoarthritis is worst in the medial compartments. Osteoarthritis of the left knee has worsened since the prior exam. Loose body in the left knee is unchanged. No comparison for the right knee is available. Electronically Signed   By: Inge Rise M.D.   On: 10/08/2020 13:39    Assessment:  Brandi Bright is a 49 y.o. female with history of liver cirrhosis, hepatitis C, splenomegaly, portal hypertension, chronic pancytopenia present for follow-up.  1. Iron deficiency anemia due to chronic blood loss    #History of iron deficiency anemia, Labs are reviewed and discussed with patient. Hemoglobin remained stable at 11.6, iron panel showed ferritin of 15, iron saturation 28. I recommend 1 dose of IV Venofer treatments for maintenance given her borderline ferritin level and high risk of bleeding  # Chronic neutropenia and thrombocytopenia Bone marrow biopsy work-up was negative previously. chronic neutropenia and thrombocytopenia are likely due to chronic cirrhosis/splenomegaly. For future elective procedures, can consider TPO for a week followed by elective procedures or platelet transfusion prior to the procedure.  #Cirrhosis/splenomegaly/history of varices She has not followed up with gastroenterology for about a year and I encourage patient to reestablish care.  She needs ultrasound liver surveillance.  Defer to gastroenterology.  #History of Vitamin b12 deficiency.  B12 has decreased.  I recommend patient to start oral vitamin B12 supplementation 1000 MCG daily.  Follow up  4 months We spent sufficient time to discuss many aspect of care, questions were answered to patient's satisfaction.   Earlie Server, MD, PhD Hematology Oncology [facility] Pager- [phone] 10/22/2020

## 2020-10-22 NOTE — Telephone Encounter (Signed)
Patient notifed of results and MD recommendations.  Please schedule her for Venofer and call her with appt details.

## 2020-10-23 NOTE — Telephone Encounter (Signed)
Done... Pt has been scheduled for Venofer as requested Pt will RTC on 10/31/20 @ 2p. A detailed message was left on her VM making her aware of the sched appt date and time

## 2020-10-31 ENCOUNTER — Inpatient Hospital Stay: Payer: Medicaid Other

## 2020-10-31 VITALS — BP 121/73 | HR 61 | Temp 97.0°F

## 2020-10-31 DIAGNOSIS — R161 Splenomegaly, not elsewhere classified: Secondary | ICD-10-CM | POA: Diagnosis not present

## 2020-10-31 DIAGNOSIS — D500 Iron deficiency anemia secondary to blood loss (chronic): Secondary | ICD-10-CM

## 2020-10-31 DIAGNOSIS — Z800 Family history of malignant neoplasm of digestive organs: Secondary | ICD-10-CM | POA: Diagnosis not present

## 2020-10-31 DIAGNOSIS — E538 Deficiency of other specified B group vitamins: Secondary | ICD-10-CM | POA: Diagnosis not present

## 2020-10-31 DIAGNOSIS — Z79899 Other long term (current) drug therapy: Secondary | ICD-10-CM | POA: Diagnosis not present

## 2020-10-31 DIAGNOSIS — D696 Thrombocytopenia, unspecified: Secondary | ICD-10-CM | POA: Diagnosis not present

## 2020-10-31 DIAGNOSIS — Z801 Family history of malignant neoplasm of trachea, bronchus and lung: Secondary | ICD-10-CM | POA: Diagnosis not present

## 2020-10-31 DIAGNOSIS — K7460 Unspecified cirrhosis of liver: Secondary | ICD-10-CM | POA: Diagnosis not present

## 2020-10-31 DIAGNOSIS — D72819 Decreased white blood cell count, unspecified: Secondary | ICD-10-CM | POA: Diagnosis not present

## 2020-10-31 MED ORDER — IRON SUCROSE 20 MG/ML IV SOLN
200.0000 mg | Freq: Once | INTRAVENOUS | Status: AC
  Administered 2020-10-31: 200 mg via INTRAVENOUS
  Filled 2020-10-31: qty 10

## 2020-10-31 MED ORDER — SODIUM CHLORIDE 0.9 % IV SOLN
Freq: Once | INTRAVENOUS | Status: AC
  Filled 2020-10-31: qty 250

## 2020-10-31 NOTE — Progress Notes (Signed)
Pt tolerated venofer infusion well with no complications. RN educated pt on the importance of notifying the clinic if any complications occur at home, pt verbalized understanding. VSS. Pt stable for discharge.   Brandi Bright [employer]

## 2020-11-20 ENCOUNTER — Encounter: Payer: Self-pay | Admitting: Certified Nurse Midwife

## 2020-11-29 ENCOUNTER — Ambulatory Visit: Payer: Medicaid Other | Admitting: Family Medicine

## 2020-11-29 ENCOUNTER — Encounter: Payer: Self-pay | Admitting: Family Medicine

## 2020-11-29 ENCOUNTER — Ambulatory Visit (INDEPENDENT_AMBULATORY_CARE_PROVIDER_SITE_OTHER): Payer: Medicaid Other | Admitting: Family Medicine

## 2020-11-29 ENCOUNTER — Other Ambulatory Visit: Payer: Self-pay

## 2020-11-29 VITALS — BP 140/91 | HR 88 | Ht <= 58 in | Wt 206.2 lb

## 2020-11-29 DIAGNOSIS — E039 Hypothyroidism, unspecified: Secondary | ICD-10-CM

## 2020-11-29 DIAGNOSIS — D509 Iron deficiency anemia, unspecified: Secondary | ICD-10-CM

## 2020-11-29 DIAGNOSIS — D72818 Other decreased white blood cell count: Secondary | ICD-10-CM

## 2020-11-29 NOTE — Assessment & Plan Note (Signed)
Normal variant for patient with Cirrhosis, will continue to monitor.

## 2020-11-29 NOTE — Progress Notes (Addendum)
Established Patient Office Visit  SUBJECTIVE:  Subjective  Patient ID: Brandi Bright, female    DOB: [birth year]  Age: 49 y.o. MRN: [id number]  CC: No chief complaint on file.   HPI Brandi Bright is a 49 y.o. female presenting today for     Past Medical History:  Diagnosis Date  . Arthritis   . Back pain   . Gout   . Hypothyroidism   . IDA (iron deficiency anemia) 01/16/2020  . MI, old   . Thyroid disease     Past Surgical History:  Procedure Laterality Date  . CARDIAC CATHETERIZATION    . CESAREAN SECTION    . COLONOSCOPY WITH PROPOFOL N/A 03/16/2018   Procedure: COLONOSCOPY WITH PROPOFOL;  Surgeon: Virgel Manifold, MD;  Location: [facility] ENDOSCOPY;  Service: Endoscopy;  Laterality: N/A;  . ESOPHAGOGASTRODUODENOSCOPY N/A 10/11/2019   Procedure: ESOPHAGOGASTRODUODENOSCOPY (EGD);  Surgeon: Lin Landsman, MD;  Location: [facility] ENDOSCOPY;  Service: Gastroenterology;  Laterality: N/A;  . ESOPHAGOGASTRODUODENOSCOPY (EGD) WITH PROPOFOL N/A 10/27/2018   Procedure: ESOPHAGOGASTRODUODENOSCOPY (EGD) WITH PROPOFOL;  Surgeon: Virgel Manifold, MD;  Location: [facility] ENDOSCOPY;  Service: Endoscopy;  Laterality: N/A;  . ESOPHAGOGASTRODUODENOSCOPY (EGD) WITH PROPOFOL N/A 06/25/2019   Procedure: ESOPHAGOGASTRODUODENOSCOPY (EGD) WITH PROPOFOL;  Surgeon: Jonathon Bellows, MD;  Location: [facility] ENDOSCOPY;  Service: Gastroenterology;  Laterality: N/A;  . ESOPHAGOGASTRODUODENOSCOPY (EGD) WITH PROPOFOL N/A 09/12/2019   Procedure: ESOPHAGOGASTRODUODENOSCOPY (EGD) WITH PROPOFOL;  Surgeon: Lucilla Lame, MD;  Location: [facility] ENDOSCOPY;  Service: Endoscopy;  Laterality: N/A;  . ESOPHAGOGASTRODUODENOSCOPY (EGD) WITH PROPOFOL N/A 11/20/2019   Procedure: ESOPHAGOGASTRODUODENOSCOPY (EGD) WITH PROPOFOL;  Surgeon: Virgel Manifold, MD;  Location: [facility] ENDOSCOPY;  Service: Endoscopy;  Laterality: N/A;  . ESOPHAGOGASTRODUODENOSCOPY (EGD) WITH PROPOFOL N/A 11/23/2019   Procedure: ESOPHAGOGASTRODUODENOSCOPY (EGD)  WITH PROPOFOL;  Surgeon: Virgel Manifold, MD;  Location: [facility] ENDOSCOPY;  Service: Endoscopy;  Laterality: N/A;  . ESOPHAGOGASTRODUODENOSCOPY (EGD) WITH PROPOFOL N/A 01/03/2020   Procedure: ESOPHAGOGASTRODUODENOSCOPY (EGD) WITH PROPOFOL;  Surgeon: Lin Landsman, MD;  Location: [facility] ENDOSCOPY;  Service: Endoscopy;  Laterality: N/A;  . TUBAL LIGATION      Family History  Problem Relation Age of Onset  . Hypertension Other   . CAD Father   . Colon cancer Father   . Lung cancer Father   . Pancreatic cancer Brother     Social History   Socioeconomic History  . Marital status: Single    Spouse name: Not on file  . Number of children: Not on file  . Years of education: Not on file  . Highest education level: Not on file  Occupational History  . Not on file  Tobacco Use  . Smoking status: Never Smoker  . Smokeless tobacco: Never Used  Vaping Use  . Vaping Use: Never used  Substance and Sexual Activity  . Alcohol use: Yes    Comment: rarely  . Drug use: Never  . Sexual activity: Not Currently  Other Topics Concern  . Not on file  Social History Narrative  . Not on file   Social Determinants of Health   Financial Resource Strain: Not on file  Food Insecurity: Not on file  Transportation Needs: Not on file  Physical Activity: Not on file  Stress: Not on file  Social Connections: Not on file  Intimate Partner Violence: Not on file     Current Outpatient Medications:  .  aspirin EC 81 MG EC tablet, Take 1 tablet (81 mg total) by mouth daily., Disp: 90 tablet, Rfl:  0 .  cyanocobalamin ([employer] VITAMIN B12) 1000 MCG tablet, Take 1 tablet (1,000 mcg total) by mouth daily., Disp: 90 tablet, Rfl: 1 .  cyclobenzaprine (FLEXERIL) 10 MG tablet, Take 10 mg by mouth daily as needed., Disp: , Rfl:  .  docusate sodium (COLACE) 100 MG capsule, Take 1 capsule (100 mg total) by mouth 2 (two) times daily., Disp: 60 capsule, Rfl: 2 .  ferrous sulfate 325 (65 FE) MG EC tablet, TAKE 1  TABLET BY MOUTH EVERY DAY WITH BREAKFAST, Disp: 30 tablet, Rfl: 2 .  hydrocortisone (ANUSOL-HC) 25 MG suppository, Place 1 suppository (25 mg total) rectally every 12 (twelve) hours., Disp: 12 suppository, Rfl: 1 .  lactulose (CHRONULAC) 10 GM/15ML solution, TAKE 15 MLS (10 G TOTAL) BY MOUTH DAILY FOR 30 DAYS., Disp: 473 mL, Rfl: 3 .  levothyroxine (SYNTHROID) 100 MCG tablet, TAKE 1 TABLET BY MOUTH DAILY BEFORE BREAKFAST., Disp: 90 tablet, Rfl: 1 .  nitroGLYCERIN (NITROLINGUAL) 0.4 MG/SPRAY spray, Place 1 spray under the tongue every 5 (five) minutes x 3 doses as needed for chest pain., Disp: 12 g, Rfl: 2 .  Oxycodone HCl 10 MG TABS, Take 10 mg by mouth 4 (four) times daily as needed., Disp: , Rfl:  .  oxyCODONE-acetaminophen (PERCOCET/ROXICET) 5-325 MG tablet, Take 1 tablet by mouth 4 (four) times daily., Disp: , Rfl:  .  pantoprazole (PROTONIX) 40 MG tablet, TAKE 1 TABLET BY MOUTH TWICE A DAY, Disp: 60 tablet, Rfl: 1   Allergies  Allergen Reactions  . Vicodin [Hydrocodone-Acetaminophen] Rash  . Amoxicillin Rash and Other (See Comments)    Has patient had a PCN reaction causing immediate rash, facial/tongue/throat swelling, SOB or lightheadedness with hypotension: Yes Has patient had a PCN reaction causing severe rash involving mucus membranes or skin necrosis: No Has patient had a PCN reaction that required hospitalization: No Has patient had a PCN reaction occurring within the last 10 years: No If all of the above answers are "NO", then may proceed with Cephalosporin use.   . Gabapentin Rash    ROS Review of Systems  Constitutional: Negative.   HENT: Negative.   Respiratory: Negative.   Cardiovascular: Negative.   Genitourinary: Negative.   Musculoskeletal: Negative.   Skin: Negative.   Neurological: Negative.   Psychiatric/Behavioral: Negative.      OBJECTIVE:    Physical Exam Constitutional:      Appearance: She is obese.  HENT:     Nose: Nose normal.     Mouth/Throat:      Mouth: Mucous membranes are moist.  Cardiovascular:     Rate and Rhythm: Normal rate and regular rhythm.     Heart sounds: Murmur heard.    Musculoskeletal:     Cervical back: Normal range of motion.  Skin:    Capillary Refill: Capillary refill takes less than 2 seconds.  Neurological:     General: No focal deficit present.     Mental Status: She is alert.  Psychiatric:        Mood and Affect: Mood normal.     BP (!) 140/91   Pulse 88   Ht 4\' 9"  (1.448 m)   Wt 206 lb 3.2 oz (93.5 kg)   LMP  (LMP Unknown) Comment: last menstrual 9 years ago, tubal ligation  BMI 44.62 kg/m  Wt Readings from Last 3 Encounters:  11/29/20 206 lb 3.2 oz (93.5 kg)  10/22/20 205 lb 8 oz (93.2 kg)  10/11/20 201 lb 3.2 oz (91.3 kg)    Health  Maintenance Due  Topic Date Due  . COVID-19 Vaccine (1) Never done  . TETANUS/TDAP  Never done  . PAP SMEAR-Modifier  Never done  . MAMMOGRAM  Never done    There are no preventive care reminders to display for this patient.  CBC Latest Ref Rng & Units 10/22/2020 08/26/2020 05/30/2020  WBC 4.0 - 10.5 K/uL 1.8(L) 2.5(L) 3.6(L)  Hemoglobin 12.0 - 15.0 g/dL 11.6(L) 11.9 13.1  Hematocrit 36.0 - 46.0 % 34.4(L) 35.9 37.9  Platelets 150 - 400 K/uL 33(L) 45(L) 51(L)   CMP Latest Ref Rng & Units 10/22/2020 08/26/2020 05/30/2020  Glucose 70 - 99 mg/dL 104(H) 96 110(H)  BUN 6 - 20 mg/dL 11 8 10   Creatinine 0.44 - 1.00 mg/dL 0.67 0.52 0.56  Sodium 135 - 145 mmol/L 142 140 141  Potassium 3.5 - 5.1 mmol/L 4.0 3.9 3.6  Chloride 98 - 111 mmol/L 110 109 111  CO2 22 - 32 mmol/L 25 24 24   Calcium 8.9 - 10.3 mg/dL 8.6(L) 8.5(L) 8.8(L)  Total Protein 6.5 - 8.1 g/dL 6.8 6.9 6.9  Total Bilirubin 0.3 - 1.2 mg/dL 1.1 1.3(H) 1.6(H)  Alkaline Phos 38 - 126 U/L 95 - 119  AST 15 - 41 U/L 40 36(H) 40  ALT 0 - 44 U/L 23 18 28     Lab Results  Component Value Date   TSH 0.02 (L) 04/19/2020   Lab Results  Component Value Date   ALBUMIN 3.5 10/22/2020   ANIONGAP 7  10/22/2020   Lab Results  Component Value Date   CHOL 107 12/29/2017   CHOL 109 08/25/2013   CHOL 84 07/03/2012   HDL 21 (L) 08/25/2013   HDL 18 (L) 07/03/2012   HDL 24 (L) 04/08/2012   LDLCALC 67 08/25/2013   LDLCALC 40 07/03/2012   LDLCALC 56 04/08/2012   Lab Results  Component Value Date   TRIG 107 08/25/2013   Lab Results  Component Value Date   HGBA1C 4.9 12/29/2017      ASSESSMENT & PLAN:   Problem List Items Addressed This Visit      Endocrine   Hypothyroid    Medication adjusted back in May of 2021, will check TSH today.         Other   Leukopenia    Normal variant for patient with Cirrhosis, will continue to monitor.       Iron deficiency anemia - Primary    Had Iron Infusin in November 2021 and is still taking oral therapy.          No orders of the defined types were placed in this encounter.     Follow-up: No follow-ups on file.    Beckie Salts, [place] [address] [place], [place] [address]

## 2020-11-29 NOTE — Assessment & Plan Note (Addendum)
Pt denies fatigue, weight change, Medication adjusted back in May of 2021 Plan- will check TSH today.

## 2020-11-29 NOTE — Assessment & Plan Note (Addendum)
Had Iron Infusin in November 2021 and is still taking oral therapy. Plan- Check labs today, will fu in 3 months.

## 2020-11-30 LAB — TSH: TSH: 2.04 mIU/L

## 2020-12-03 NOTE — Addendum Note (Signed)
Addended by: Beckie Salts on: 12/03/2020 11:32 AM   Modules accepted: Level of Service

## 2020-12-17 ENCOUNTER — Encounter: Payer: Self-pay | Admitting: Certified Nurse Midwife

## 2020-12-24 ENCOUNTER — Ambulatory Visit (INDEPENDENT_AMBULATORY_CARE_PROVIDER_SITE_OTHER): Payer: Medicaid Other | Admitting: Internal Medicine

## 2020-12-24 ENCOUNTER — Other Ambulatory Visit: Payer: Self-pay

## 2020-12-24 VITALS — BP 140/84 | HR 68 | Wt 204.9 lb

## 2020-12-24 DIAGNOSIS — Z6841 Body Mass Index (BMI) 40.0 and over, adult: Secondary | ICD-10-CM

## 2020-12-24 DIAGNOSIS — Z79891 Long term (current) use of opiate analgesic: Secondary | ICD-10-CM | POA: Diagnosis not present

## 2020-12-24 DIAGNOSIS — Z79899 Other long term (current) drug therapy: Secondary | ICD-10-CM | POA: Diagnosis not present

## 2020-12-24 DIAGNOSIS — M47812 Spondylosis without myelopathy or radiculopathy, cervical region: Secondary | ICD-10-CM | POA: Diagnosis not present

## 2020-12-24 DIAGNOSIS — M5412 Radiculopathy, cervical region: Secondary | ICD-10-CM | POA: Diagnosis not present

## 2020-12-24 DIAGNOSIS — K7469 Other cirrhosis of liver: Secondary | ICD-10-CM | POA: Diagnosis not present

## 2020-12-24 DIAGNOSIS — I10 Essential (primary) hypertension: Secondary | ICD-10-CM | POA: Diagnosis not present

## 2020-12-24 DIAGNOSIS — G894 Chronic pain syndrome: Secondary | ICD-10-CM | POA: Diagnosis not present

## 2020-12-24 DIAGNOSIS — M542 Cervicalgia: Secondary | ICD-10-CM | POA: Diagnosis not present

## 2020-12-24 NOTE — Progress Notes (Signed)
Established Patient Office Visit  Subjective:  Patient ID: Brandi Bright, female    DOB: December 14, 1971  Age: 50 y.o. MRN: [id number]  CC:  Chief Complaint  Patient presents with  . knot on right side    Knot on right side of ear and having pain going down ear into shoulder. Noticied it 4 days ago. Needs thyroid med refilled    HPI  Brandi Bright presents for neck pain  Past Medical History:  Diagnosis Date  . Arthritis   . Back pain   . Gout   . Hypothyroidism   . IDA (iron deficiency anemia) 01/16/2020  . MI, old   . Thyroid disease     Past Surgical History:  Procedure Laterality Date  . CARDIAC CATHETERIZATION    . CESAREAN SECTION    . COLONOSCOPY WITH PROPOFOL N/A 03/16/2018   Procedure: COLONOSCOPY WITH PROPOFOL;  Surgeon: Virgel Manifold, MD;  Location: [facility] ENDOSCOPY;  Service: Endoscopy;  Laterality: N/A;  . ESOPHAGOGASTRODUODENOSCOPY N/A 10/11/2019   Procedure: ESOPHAGOGASTRODUODENOSCOPY (EGD);  Surgeon: Lin Landsman, MD;  Location: [facility] ENDOSCOPY;  Service: Gastroenterology;  Laterality: N/A;  . ESOPHAGOGASTRODUODENOSCOPY (EGD) WITH PROPOFOL N/A 10/27/2018   Procedure: ESOPHAGOGASTRODUODENOSCOPY (EGD) WITH PROPOFOL;  Surgeon: Virgel Manifold, MD;  Location: [facility] ENDOSCOPY;  Service: Endoscopy;  Laterality: N/A;  . ESOPHAGOGASTRODUODENOSCOPY (EGD) WITH PROPOFOL N/A 06/25/2019   Procedure: ESOPHAGOGASTRODUODENOSCOPY (EGD) WITH PROPOFOL;  Surgeon: Jonathon Bellows, MD;  Location: [facility] ENDOSCOPY;  Service: Gastroenterology;  Laterality: N/A;  . ESOPHAGOGASTRODUODENOSCOPY (EGD) WITH PROPOFOL N/A 09/12/2019   Procedure: ESOPHAGOGASTRODUODENOSCOPY (EGD) WITH PROPOFOL;  Surgeon: Lucilla Lame, MD;  Location: [facility] ENDOSCOPY;  Service: Endoscopy;  Laterality: N/A;  . ESOPHAGOGASTRODUODENOSCOPY (EGD) WITH PROPOFOL N/A 11/20/2019   Procedure: ESOPHAGOGASTRODUODENOSCOPY (EGD) WITH PROPOFOL;  Surgeon: Virgel Manifold, MD;  Location: [facility] ENDOSCOPY;  Service: Endoscopy;   Laterality: N/A;  . ESOPHAGOGASTRODUODENOSCOPY (EGD) WITH PROPOFOL N/A 11/23/2019   Procedure: ESOPHAGOGASTRODUODENOSCOPY (EGD) WITH PROPOFOL;  Surgeon: Virgel Manifold, MD;  Location: [facility] ENDOSCOPY;  Service: Endoscopy;  Laterality: N/A;  . ESOPHAGOGASTRODUODENOSCOPY (EGD) WITH PROPOFOL N/A 01/03/2020   Procedure: ESOPHAGOGASTRODUODENOSCOPY (EGD) WITH PROPOFOL;  Surgeon: Lin Landsman, MD;  Location: [facility] ENDOSCOPY;  Service: Endoscopy;  Laterality: N/A;  . TUBAL LIGATION      Family History  Problem Relation Age of Onset  . Hypertension Other   . CAD Father   . Colon cancer Father   . Lung cancer Father   . Pancreatic cancer Brother     Social History   Socioeconomic History  . Marital status: Single    Spouse name: Not on file  . Number of children: Not on file  . Years of education: Not on file  . Highest education level: Not on file  Occupational History  . Not on file  Tobacco Use  . Smoking status: Never Smoker  . Smokeless tobacco: Never Used  Vaping Use  . Vaping Use: Never used  Substance and Sexual Activity  . Alcohol use: Yes    Comment: rarely  . Drug use: Never  . Sexual activity: Not Currently  Other Topics Concern  . Not on file  Social History Narrative  . Not on file   Social Determinants of Health   Financial Resource Strain: Not on file  Food Insecurity: Not on file  Transportation Needs: Not on file  Physical Activity: Not on file  Stress: Not on file  Social Connections: Not on file  Intimate Partner Violence: Not on file  Current Outpatient Medications:  .  aspirin EC 81 MG EC tablet, Take 1 tablet (81 mg total) by mouth daily., Disp: 90 tablet, Rfl: 0 .  azithromycin (ZITHROMAX) 250 MG tablet, Take by mouth daily., Disp: , Rfl:  .  cyanocobalamin ([employer] VITAMIN B12) 1000 MCG tablet, Take 1 tablet (1,000 mcg total) by mouth daily., Disp: 90 tablet, Rfl: 1 .  cyclobenzaprine (FLEXERIL) 10 MG tablet, Take 10 mg by mouth daily  as needed., Disp: , Rfl:  .  docusate sodium (COLACE) 100 MG capsule, Take 1 capsule (100 mg total) by mouth 2 (two) times daily., Disp: 60 capsule, Rfl: 2 .  ferrous sulfate 325 (65 FE) MG EC tablet, TAKE 1 TABLET BY MOUTH EVERY DAY WITH BREAKFAST, Disp: 30 tablet, Rfl: 2 .  hydrocortisone (ANUSOL-HC) 25 MG suppository, Place 1 suppository (25 mg total) rectally every 12 (twelve) hours., Disp: 12 suppository, Rfl: 1 .  lactulose (CHRONULAC) 10 GM/15ML solution, TAKE 15 MLS (10 G TOTAL) BY MOUTH DAILY FOR 30 DAYS., Disp: 473 mL, Rfl: 3 .  levothyroxine (SYNTHROID) 100 MCG tablet, TAKE 1 TABLET BY MOUTH DAILY BEFORE BREAKFAST., Disp: 90 tablet, Rfl: 1 .  nitroGLYCERIN (NITROLINGUAL) 0.4 MG/SPRAY spray, Place 1 spray under the tongue every 5 (five) minutes x 3 doses as needed for chest pain., Disp: 12 g, Rfl: 2 .  Oxycodone HCl 10 MG TABS, Take 10 mg by mouth 4 (four) times daily as needed., Disp: , Rfl:  .  oxyCODONE-acetaminophen (PERCOCET/ROXICET) 5-325 MG tablet, Take 1 tablet by mouth 4 (four) times daily. (Patient not taking: Reported on 12/24/2020), Disp: , Rfl:    Allergies  Allergen Reactions  . Vicodin [Hydrocodone-Acetaminophen] Rash  . Amoxicillin Rash and Other (See Comments)    Has patient had a PCN reaction causing immediate rash, facial/tongue/throat swelling, SOB or lightheadedness with hypotension: Yes Has patient had a PCN reaction causing severe rash involving mucus membranes or skin necrosis: No Has patient had a PCN reaction that required hospitalization: No Has patient had a PCN reaction occurring within the last 10 years: No If all of the above answers are "NO", then may proceed with Cephalosporin use.   . Gabapentin Rash    ROS Review of Systems  Constitutional: Negative.   HENT: Negative.   Eyes: Negative.   Respiratory: Negative.   Cardiovascular: Negative.   Gastrointestinal: Negative.   Endocrine: Negative.   Genitourinary: Negative.   Musculoskeletal:  Negative.   Skin: Negative.   Allergic/Immunologic: Negative.   Neurological: Negative.   Hematological: Negative.   Psychiatric/Behavioral: Negative.   All other systems reviewed and are negative.     Objective:    Physical Exam Vitals reviewed.  Constitutional:      Appearance: Normal appearance.  HENT:     Mouth/Throat:     Mouth: Mucous membranes are moist.  Eyes:     Pupils: Pupils are equal, round, and reactive to light.  Neck:     Vascular: No carotid bruit.  Cardiovascular:     Rate and Rhythm: Normal rate and regular rhythm.     Pulses: Normal pulses.     Heart sounds: Normal heart sounds.  Pulmonary:     Effort: Pulmonary effort is normal.     Breath sounds: Normal breath sounds.  Abdominal:     General: Bowel sounds are normal.     Palpations: Abdomen is soft. There is no hepatomegaly, splenomegaly or mass.     Tenderness: There is no abdominal tenderness.  Hernia: No hernia is present.  Musculoskeletal:        General: No tenderness.     Cervical back: Neck supple.     Right lower leg: No edema.     Left lower leg: No edema.     Comments: Pain in neck rt side  Skin:    Findings: No rash.  Neurological:     Mental Status: She is alert and oriented to person, place, and time.     Motor: No weakness.  Psychiatric:        Mood and Affect: Mood and affect normal.        Behavior: Behavior normal.     BP 140/84   Pulse 68   Wt 204 lb 14.4 oz (92.9 kg)   LMP  (LMP Unknown) Comment: last menstrual 9 years ago, tubal ligation  SpO2 98%   BMI 44.34 kg/m  Wt Readings from Last 3 Encounters:  12/24/20 204 lb 14.4 oz (92.9 kg)  11/29/20 206 lb 3.2 oz (93.5 kg)  10/22/20 205 lb 8 oz (93.2 kg)     Health Maintenance Due  Topic Date Due  . COVID-19 Vaccine (1) Never done  . TETANUS/TDAP  Never done  . PAP SMEAR-Modifier  Never done  . MAMMOGRAM  Never done    There are no preventive care reminders to display for this patient.  Lab Results   Component Value Date   TSH 2.04 11/29/2020   Lab Results  Component Value Date   WBC 1.8 (L) 10/22/2020   HGB 11.6 (L) 10/22/2020   HCT 34.4 (L) 10/22/2020   MCV 92.2 10/22/2020   PLT 33 (L) 10/22/2020   Lab Results  Component Value Date   NA 142 10/22/2020   K 4.0 10/22/2020   CO2 25 10/22/2020   GLUCOSE 104 (H) 10/22/2020   BUN 11 10/22/2020   CREATININE 0.67 10/22/2020   BILITOT 1.1 10/22/2020   ALKPHOS 95 10/22/2020   AST 40 10/22/2020   ALT 23 10/22/2020   PROT 6.8 10/22/2020   ALBUMIN 3.5 10/22/2020   CALCIUM 8.6 (L) 10/22/2020   ANIONGAP 7 10/22/2020   Lab Results  Component Value Date   CHOL 107 12/29/2017   Lab Results  Component Value Date   HDL 21 (L) 08/25/2013   Lab Results  Component Value Date   LDLCALC 67 08/25/2013   Lab Results  Component Value Date   TRIG 107 08/25/2013   No results found for: [facility] Lab Results  Component Value Date   HGBA1C 4.9 12/29/2017      Assessment & Plan:   Problem List Items Addressed This Visit      Cardiovascular and Mediastinum   Essential hypertension - Primary     .  - I encouraged the patient to eat a low-sodium diet to help control blood pressure. - I encouraged the patient to live an active lifestyle and complete activities that increases heart rate to 85% target heart rate at least 5 times per week for one hour.            Digestive   Other cirrhosis of liver (HCC)    Cirrhosis of liver is stable.  Patient is being followed up by GI specialist.        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  I suggested to use neck pillow to sleep at night.  Other   Class 3 severe obesity due to excess calories with serious comorbidity and body mass index (BMI) of 40.0 to 44.9 in adult [facility])    - I encouraged the patient to lose weight.  - I educated them on making healthy dietary choices  including eating more fruits and vegetables and less fried foods. - I encouraged the patient to exercise more, and educated on the benefits of exercise including weight loss, diabetes management, and hypertension management.           No orders of the defined types were placed in this encounter.   Follow-up: No follow-ups on file.    Cletis Athens, MD

## 2020-12-30 ENCOUNTER — Encounter: Payer: Self-pay | Admitting: Internal Medicine

## 2020-12-30 NOTE — Assessment & Plan Note (Signed)
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  I suggested to use neck pillow to sleep at night.

## 2020-12-30 NOTE — Assessment & Plan Note (Signed)
-   I encouraged the patient to lose weight.  - I educated them on making healthy dietary choices including eating more fruits and vegetables and less fried foods. - I encouraged the patient to exercise more, and educated on the benefits of exercise including weight loss, diabetes management, and hypertension management.   

## 2020-12-30 NOTE — Assessment & Plan Note (Signed)
Cirrhosis of liver is stable.  Patient is being followed up by GI specialist.

## 2020-12-30 NOTE — Assessment & Plan Note (Signed)
.  -   I encouraged the patient to eat a low-sodium diet to help control blood pressure. - I encouraged the patient to live an active lifestyle and complete activities that increases heart rate to 85% target heart rate at least 5 times per week for one hour.     

## 2021-01-21 ENCOUNTER — Other Ambulatory Visit: Payer: Self-pay | Admitting: Internal Medicine

## 2021-02-19 ENCOUNTER — Other Ambulatory Visit: Payer: Medicaid Other

## 2021-02-21 ENCOUNTER — Ambulatory Visit: Payer: Medicaid Other | Admitting: Oncology

## 2021-03-03 ENCOUNTER — Other Ambulatory Visit: Payer: Self-pay

## 2021-03-03 ENCOUNTER — Inpatient Hospital Stay: Payer: Medicaid Other | Attending: Oncology

## 2021-03-03 DIAGNOSIS — K7460 Unspecified cirrhosis of liver: Secondary | ICD-10-CM | POA: Diagnosis not present

## 2021-03-03 DIAGNOSIS — R161 Splenomegaly, not elsewhere classified: Secondary | ICD-10-CM | POA: Insufficient documentation

## 2021-03-03 DIAGNOSIS — D500 Iron deficiency anemia secondary to blood loss (chronic): Secondary | ICD-10-CM | POA: Diagnosis not present

## 2021-03-03 DIAGNOSIS — E538 Deficiency of other specified B group vitamins: Secondary | ICD-10-CM | POA: Insufficient documentation

## 2021-03-03 DIAGNOSIS — D696 Thrombocytopenia, unspecified: Secondary | ICD-10-CM | POA: Insufficient documentation

## 2021-03-03 DIAGNOSIS — R58 Hemorrhage, not elsewhere classified: Secondary | ICD-10-CM | POA: Diagnosis not present

## 2021-03-03 LAB — FOLATE: Folate: 17.8 ng/mL (ref >5.9)

## 2021-03-03 LAB — COMPREHENSIVE METABOLIC PANEL
ALT: 22 U/L (ref 0–44)
AST: 35 U/L (ref 15–41)
Albumin: 3.5 g/dL (ref 3.5–5.0)
Alkaline Phosphatase: 90 U/L (ref 38–126)
Anion gap: 9 (ref 5–15)
BUN: 10 mg/dL (ref 6–20)
CO2: 24 mmol/L (ref 22–32)
Calcium: 8.9 mg/dL (ref 8.9–10.3)
Chloride: 106 mmol/L (ref 98–111)
Creatinine, Ser: 0.49 mg/dL (ref 0.44–1.00)
GFR, Estimated: >60 mL/min (ref >60)
Glucose, Bld: 105 mg/dL — ABNORMAL HIGH (ref 70–99)
Potassium: 4.1 mmol/L (ref 3.5–5.1)
Sodium: 139 mmol/L (ref 135–145)
Total Bilirubin: 1.4 mg/dL — ABNORMAL HIGH (ref 0.3–1.2)
Total Protein: 6.8 g/dL (ref 6.5–8.1)

## 2021-03-03 LAB — CBC WITH DIFFERENTIAL/PLATELET
Abs Immature Granulocytes: 0 10*3/uL (ref 0.00–0.07)
Basophils Absolute: 0 10*3/uL (ref 0.0–0.1)
Basophils Relative: 1 %
Eosinophils Absolute: 0.1 10*3/uL (ref 0.0–0.5)
Eosinophils Relative: 3 %
HCT: 37.3 % (ref 36.0–46.0)
Hemoglobin: 13.1 g/dL (ref 12.0–15.0)
Immature Granulocytes: 0 %
Lymphocytes Relative: 35 %
Lymphs Abs: 0.7 10*3/uL (ref 0.7–4.0)
MCH: 31 pg (ref 26.0–34.0)
MCHC: 35.1 g/dL (ref 30.0–36.0)
MCV: 88.2 fL (ref 80.0–100.0)
Monocytes Absolute: 0.2 10*3/uL (ref 0.1–1.0)
Monocytes Relative: 8 %
Neutro Abs: 1 10*3/uL — ABNORMAL LOW (ref 1.7–7.7)
Neutrophils Relative %: 53 %
Platelets: 36 10*3/uL — ABNORMAL LOW (ref 150–400)
RBC: 4.23 MIL/uL (ref 3.87–5.11)
RDW: 13 % (ref 11.5–15.5)
WBC: 1.9 10*3/uL — ABNORMAL LOW (ref 4.0–10.5)
nRBC: 0 % (ref 0.0–0.2)

## 2021-03-03 LAB — SAMPLE TO BLOOD BANK

## 2021-03-03 LAB — VITAMIN B12: Vitamin B-12: 128 pg/mL — ABNORMAL LOW (ref 180–914)

## 2021-03-04 ENCOUNTER — Encounter: Payer: Self-pay | Admitting: Oncology

## 2021-03-04 ENCOUNTER — Inpatient Hospital Stay (HOSPITAL_BASED_OUTPATIENT_CLINIC_OR_DEPARTMENT_OTHER): Payer: Medicaid Other | Admitting: Oncology

## 2021-03-04 VITALS — BP 132/86 | HR 84 | Temp 97.9°F | Resp 18 | Wt 209.7 lb

## 2021-03-04 DIAGNOSIS — D696 Thrombocytopenia, unspecified: Secondary | ICD-10-CM | POA: Diagnosis not present

## 2021-03-04 DIAGNOSIS — D500 Iron deficiency anemia secondary to blood loss (chronic): Secondary | ICD-10-CM | POA: Diagnosis not present

## 2021-03-04 DIAGNOSIS — E538 Deficiency of other specified B group vitamins: Secondary | ICD-10-CM

## 2021-03-04 DIAGNOSIS — K7460 Unspecified cirrhosis of liver: Secondary | ICD-10-CM | POA: Diagnosis not present

## 2021-03-04 DIAGNOSIS — R161 Splenomegaly, not elsewhere classified: Secondary | ICD-10-CM

## 2021-03-04 DIAGNOSIS — R58 Hemorrhage, not elsewhere classified: Secondary | ICD-10-CM | POA: Diagnosis not present

## 2021-03-04 NOTE — Progress Notes (Signed)
Patient here for follow up. No new concerns voiced.  °

## 2021-03-04 NOTE — Progress Notes (Signed)
[facility] day:  03/04/2021  Chief Complaint: Brandi Bright is a 50 y.o. female follows up for management of iron deficiency anemia, thrombocytopenia, and leukopenia   PERTINENT HEMATOLOGY HISTORY Patient follows up with Dr. Mike Gip previously.  Establish care with me on 02/09/2019. Reviewed patient's previous medical records, labs, imaging results. # History of cirrhosis and hepatitis C.  She has a history of chronic anemia, thrombocytopenia and leukopenia dating back to 03/2012.  Platelet count has fluctuated between 54,000 - 56,000 since 12/28/2017 (previously 73,000 - 134,000).  Ferritin was 14 on 08/31/2018.    Work-up on 09/23/2018 revealed a hematocrit of 31.0, hemoglobin 10.1, MCV 87.6, platelets 46,000, WBC 1900 with an ANC of 1100.  Ferritin was 10 (low) with iron saturation 16% with a TIBC of 401.  Retic was 1.2%.  B12 was 315.  Normal studies included: folate, SPEP, and free light chain ratio.  Copper was 69 (72-166).  ANA was + with double stranded DNA antibody 13 (0-9).  TSH was 0.036 (0.35-4.5) with a free T4 1.05 (0.82-1.77).  Peripheral smear revealed variant lymphocytes.  Ferritin has been followed:  14 on 08/31/2018 and 10 on 09/23/2018.  Abdomen and pelvic CT on 07/14/2018 revealed cirrhosis with portal hypertension noted by prominent splenomegaly (18.5 x 13.8 x 8.1 cm; volume 1100 cm3), enlarged portal veins with recannulated umbilical vein, paraesophageal and perigastric varices.  There was mild thickening of the cecum and ascending colon.  EGD on 10/27/2018 revealed grade II esophageal varices.  There was erythematous mucosa in the antrum.  There was congested, erythematous, friable (with contact bleeding), granular and nodular mucosa in the gastric fundus and astric body.  There was a single gastric polyp (polypoid ulcerated antral type mucos with chronic active mucosal inflammation).  There was a normal duodenal bulb, second  portion of the duodenum and examined duodenum.  The patient's iron deficiency could be explained by her friable gastric mucosa (likely from portal hypertension).  There was no history of variceal bleeding and thus this is not a cause of her iron deficiency.  # admitted from 10/11/2019 to 10/12/2019 due to rectal bleeding EGD 10/12/2019 showed portal hypertensive gastropathy, treated with APC.  Nonbleeding large esophageal varices incompletely evaluated.  Banded. Patient follows with gastroenterology and had EGD on 11/20/2019. Banding was not done due to large amount of food in the stomach. There was plan for EGD on 11/23/2019.  Her gastroenterologist Dr. Bonna Gains contacted me to see if patient can get platelet transfusion to improve her platelet counts for banding on 11/23/2019.     INTERVAL HISTORY Brandi Bright is a 50 y.o. female who has above history reviewed by me today presents for follow up visit for management of thrombocytopenia, splenomegaly, leukopenia and anemia. Problems and complaints are listed below: Labs reviewed and discussed with patient.  Positive for easy bruising.  No acute bleeding. She has noticed A quarter size bruising area on her left calf.  Review of Systems  Constitutional: Positive for fatigue. Negative for appetite change, chills and fever.  HENT:   Negative for hearing loss and voice change.   Eyes: Negative for eye problems.  Respiratory: Negative for chest tightness and cough.   Cardiovascular: Negative for chest pain.  Gastrointestinal: Negative for abdominal distention, abdominal pain and blood in stool.  Endocrine: Negative for hot flashes.  Genitourinary: Negative for difficulty urinating and frequency.   Musculoskeletal: Negative for arthralgias.  Skin: Negative for itching and rash.  Neurological:  Negative for extremity weakness.  Hematological: Negative for adenopathy. Bruises/bleeds easily.  Psychiatric/Behavioral: Negative for confusion.   Past  Medical History:  Diagnosis Date  . Arthritis   . Back pain   . Gout   . Hypothyroidism   . IDA (iron deficiency anemia) 01/16/2020  . MI, old   . Thyroid disease     Past Surgical History:  Procedure Laterality Date  . CARDIAC CATHETERIZATION    . CESAREAN SECTION    . COLONOSCOPY WITH PROPOFOL N/A 03/16/2018   Procedure: COLONOSCOPY WITH PROPOFOL;  Surgeon: Virgel Manifold, MD;  Location: [facility] ENDOSCOPY;  Service: Endoscopy;  Laterality: N/A;  . ESOPHAGOGASTRODUODENOSCOPY N/A 10/11/2019   Procedure: ESOPHAGOGASTRODUODENOSCOPY (EGD);  Surgeon: Lin Landsman, MD;  Location: [facility] ENDOSCOPY;  Service: Gastroenterology;  Laterality: N/A;  . ESOPHAGOGASTRODUODENOSCOPY (EGD) WITH PROPOFOL N/A 10/27/2018   Procedure: ESOPHAGOGASTRODUODENOSCOPY (EGD) WITH PROPOFOL;  Surgeon: Virgel Manifold, MD;  Location: [facility] ENDOSCOPY;  Service: Endoscopy;  Laterality: N/A;  . ESOPHAGOGASTRODUODENOSCOPY (EGD) WITH PROPOFOL N/A 06/25/2019   Procedure: ESOPHAGOGASTRODUODENOSCOPY (EGD) WITH PROPOFOL;  Surgeon: Jonathon Bellows, MD;  Location: [facility] ENDOSCOPY;  Service: Gastroenterology;  Laterality: N/A;  . ESOPHAGOGASTRODUODENOSCOPY (EGD) WITH PROPOFOL N/A 09/12/2019   Procedure: ESOPHAGOGASTRODUODENOSCOPY (EGD) WITH PROPOFOL;  Surgeon: Lucilla Lame, MD;  Location: [facility] ENDOSCOPY;  Service: Endoscopy;  Laterality: N/A;  . ESOPHAGOGASTRODUODENOSCOPY (EGD) WITH PROPOFOL N/A 11/20/2019   Procedure: ESOPHAGOGASTRODUODENOSCOPY (EGD) WITH PROPOFOL;  Surgeon: Virgel Manifold, MD;  Location: [facility] ENDOSCOPY;  Service: Endoscopy;  Laterality: N/A;  . ESOPHAGOGASTRODUODENOSCOPY (EGD) WITH PROPOFOL N/A 11/23/2019   Procedure: ESOPHAGOGASTRODUODENOSCOPY (EGD) WITH PROPOFOL;  Surgeon: Virgel Manifold, MD;  Location: [facility] ENDOSCOPY;  Service: Endoscopy;  Laterality: N/A;  . ESOPHAGOGASTRODUODENOSCOPY (EGD) WITH PROPOFOL N/A 01/03/2020   Procedure: ESOPHAGOGASTRODUODENOSCOPY (EGD) WITH PROPOFOL;  Surgeon: Lin Landsman, MD;  Location: [facility] ENDOSCOPY;  Service: Endoscopy;  Laterality: N/A;  . TUBAL LIGATION      Family History  Problem Relation Age of Onset  . Hypertension Other   . CAD Father   . Colon cancer Father   . Lung cancer Father   . Pancreatic cancer Brother    Social History   Socioeconomic History  . Marital status: Single    Spouse name: Not on file  . Number of children: Not on file  . Years of education: Not on file  . Highest education level: Not on file  Occupational History  . Not on file  Tobacco Use  . Smoking status: Never Smoker  . Smokeless tobacco: Never Used  Vaping Use  . Vaping Use: Never used  Substance and Sexual Activity  . Alcohol use: Yes    Comment: rarely  . Drug use: Never  . Sexual activity: Not Currently  Other Topics Concern  . Not on file  Social History Narrative  . Not on file   Social Determinants of Health   Financial Resource Strain: Not on file  Food Insecurity: Not on file  Transportation Needs: Not on file  Physical Activity: Not on file  Stress: Not on file  Social Connections: Not on file  Intimate Partner Violence: Not on file    Allergies:  Allergies  Allergen Reactions  . Vicodin [Hydrocodone-Acetaminophen] Rash  . Amoxicillin Rash and Other (See Comments)    Has patient had a PCN reaction causing immediate rash, facial/tongue/throat swelling, SOB or lightheadedness with hypotension: Yes Has patient had a PCN reaction causing severe rash involving mucus membranes or skin necrosis: No Has patient had a PCN  reaction that required hospitalization: No Has patient had a PCN reaction occurring within the last 10 years: No If all of the above answers are "NO", then may proceed with Cephalosporin use.   . Gabapentin Rash    Current Medications: Current Outpatient Medications  Medication Sig Dispense Refill  . aspirin EC 81 MG EC tablet Take 1 tablet (81 mg total) by mouth daily. 90 tablet 0  . cyanocobalamin  ([employer] VITAMIN B12) 1000 MCG tablet Take 1 tablet (1,000 mcg total) by mouth daily. 90 tablet 1  . cyclobenzaprine (FLEXERIL) 10 MG tablet Take 10 mg by mouth daily as needed.    . docusate sodium (COLACE) 100 MG capsule Take 1 capsule (100 mg total) by mouth 2 (two) times daily. 60 capsule 2  . ferrous sulfate 325 (65 FE) MG EC tablet TAKE 1 TABLET BY MOUTH EVERY DAY WITH BREAKFAST 30 tablet 2  . hydrocortisone (ANUSOL-HC) 25 MG suppository Place 1 suppository (25 mg total) rectally every 12 (twelve) hours. 12 suppository 1  . Oxycodone HCl 10 MG TABS Take 10 mg by mouth 4 (four) times daily as needed.    Marland Kitchen SYNTHROID 150 MCG tablet TAKE 1 TABLET BY MOUTH DAILY, 30 MIN BEFORE MEALS. 30 tablet 1  . lactulose (CHRONULAC) 10 GM/15ML solution TAKE 15 MLS (10 G TOTAL) BY MOUTH DAILY FOR 30 DAYS. (Patient not taking: Reported on 03/04/2021) 473 mL 3  . nitroGLYCERIN (NITROLINGUAL) 0.4 MG/SPRAY spray Place 1 spray under the tongue every 5 (five) minutes x 3 doses as needed for chest pain. (Patient not taking: Reported on 03/04/2021) 12 g 2  . oxyCODONE-acetaminophen (PERCOCET/ROXICET) 5-325 MG tablet Take 1 tablet by mouth 4 (four) times daily. (Patient not taking: No sig reported)     No current facility-administered medications for this visit.     Physical Exam: Blood pressure 132/86, pulse 84, temperature 97.9 F (36.6 C), resp. rate 18, weight 209 lb 11.2 oz (95.1 kg).   Physical Exam Constitutional:      General: She is not in acute distress.    Appearance: She is not diaphoretic.  HENT:     Head: Normocephalic and atraumatic.     Nose: Nose normal.     Mouth/Throat:     Pharynx: No oropharyngeal exudate.  Eyes:     General: No scleral icterus.    Pupils: Pupils are equal, round, and reactive to light.  Cardiovascular:     Rate and Rhythm: Normal rate and regular rhythm.     Heart sounds: No murmur heard.   Pulmonary:     Effort: Pulmonary effort is normal. No respiratory distress.      Breath sounds: Normal breath sounds. No rales.  Chest:     Chest wall: No tenderness.  Abdominal:     General: Bowel sounds are normal. There is no distension.     Palpations: Abdomen is soft.     Tenderness: There is no abdominal tenderness.     Comments: Splenomegaly  Musculoskeletal:        General: Normal range of motion.     Cervical back: Normal range of motion and neck supple.  Skin:    General: Skin is warm and dry.     Findings: No erythema.  Neurological:     Mental Status: She is alert and oriented to person, place, and time.     Cranial Nerves: No cranial nerve deficit.     Motor: No abnormal muscle tone.     Coordination: Coordination normal.  Psychiatric:        Mood and Affect: Affect normal.     Appointment on 03/03/2021  Component Date Value Ref Range Status  . Blood Bank Specimen 03/03/2021 SAMPLE AVAILABLE FOR TESTING   Final  . Sample Expiration 03/03/2021    Final                   Value:03/06/2021,2359 Performed at [facility], [address], [place], [place] [address]   . Folate 03/03/2021 17.8  >5.9 ng/mL Final   Performed at [facility], [address] [place], [place] [address]  . Vitamin B-12 03/03/2021 128* 180 - 914 pg/mL Final   Comment: (NOTE) This assay is not validated for testing neonatal or myeloproliferative syndrome specimens for Vitamin B12 levels. Performed at [facility], [place] [address] [place], [place] [address]   . Sodium 03/03/2021 139  135 - 145 mmol/L Final  . Potassium 03/03/2021 4.1  3.5 - 5.1 mmol/L Final  . Chloride 03/03/2021 106  98 - 111 mmol/L Final  . CO2 03/03/2021 24  22 - 32 mmol/L Final  . Glucose, Bld 03/03/2021 105* 70 - 99 mg/dL Final   Glucose reference range applies only to samples taken after fasting for at least 8 hours.  . BUN 03/03/2021 10  6 - 20 mg/dL Final  . Creatinine, Ser 03/03/2021 0.49  0.44 - 1.00 mg/dL Final  . Calcium 03/03/2021 8.9  8.9 - 10.3 mg/dL Final  .  Total Protein 03/03/2021 6.8  6.5 - 8.1 g/dL Final  . Albumin 03/03/2021 3.5  3.5 - 5.0 g/dL Final  . AST 03/03/2021 35  15 - 41 U/L Final  . ALT 03/03/2021 22  0 - 44 U/L Final  . Alkaline Phosphatase 03/03/2021 90  38 - 126 U/L Final  . Total Bilirubin 03/03/2021 1.4* 0.3 - 1.2 mg/dL Final  . GFR, Estimated 03/03/2021 >60  >60 mL/min Final   Comment: (NOTE) Calculated using the CKD-EPI Creatinine Equation (2021)   . Anion gap 03/03/2021 9  5 - 15 Final   Performed at [facility], [address] [place],  [address]  . WBC 03/03/2021 1.9* 4.0 - 10.5 K/uL Final  . RBC 03/03/2021 4.23  3.87 - 5.11 MIL/uL Final  . Hemoglobin 03/03/2021 13.1  12.0 - 15.0 g/dL Final  . HCT 03/03/2021 37.3  36.0 - 46.0 % Final  . MCV 03/03/2021 88.2  80.0 - 100.0 fL Final  . MCH 03/03/2021 31.0  26.0 - 34.0 pg Final  . MCHC 03/03/2021 35.1  30.0 - 36.0 g/dL Final  . RDW 03/03/2021 13.0  11.5 - 15.5 % Final  . Platelets 03/03/2021 36* 150 - 400 K/uL Final   Comment: Immature Platelet Fraction may be clinically indicated, consider ordering this additional test [id number]   . nRBC 03/03/2021 0.0  0.0 - 0.2 % Final  . Neutrophils Relative % 03/03/2021 53  % Final  . Neutro Abs 03/03/2021 1.0* 1.7 - 7.7 K/uL Final  . Lymphocytes Relative 03/03/2021 35  % Final  . Lymphs Abs 03/03/2021 0.7  0.7 - 4.0 K/uL Final  . Monocytes Relative 03/03/2021 8  % Final  . Monocytes Absolute 03/03/2021 0.2  0.1 - 1.0 K/uL Final  . Eosinophils Relative 03/03/2021 3  % Final  . Eosinophils Absolute 03/03/2021 0.1  0.0 - 0.5 K/uL Final  . Basophils Relative 03/03/2021 1  % Final  . Basophils Absolute 03/03/2021 0.0  0.0 - 0.1 K/uL  Final  . Immature Granulocytes 03/03/2021 0  % Final  . Abs Immature Granulocytes 03/03/2021 0.00  0.00 - 0.07 K/uL Final   Performed at [facility], [address] [place], [place] [address]    RADIOGRAPHIC STUDIES: I have personally reviewed the radiological images as  listed and agreed with the findings in the report. No results found.  Assessment:  CHAVONNE SFORZA is a 50 y.o. female with history of liver cirrhosis, hepatitis C, splenomegaly, portal hypertension, chronic pancytopenia present for follow-up.  1. Iron deficiency anemia due to chronic blood loss   2. Thrombocytopenia (HCC)   3. Splenomegaly   4. Low vitamin B12 level    #History of iron deficiency anemia, hemoglobin has resolved to 13.1.  # Chronic neutropenia and thrombocytopenia Bone marrow biopsy work-up was negative previously. chronic neutropenia and thrombocytopenia are likely due to chronic cirrhosis/splenomegaly. For future elective procedures, can consider TPO for a week followed by elective procedures or platelet transfusion prior to the procedure.  Labs reviewed and discussed with patient.  Stable counts. Continue monitor.  #Vitamin B12 deficiency.  Patient has been on oral vitamin B12 supplementation. Intramuscular vitamin B12 injection can be considered however she is at risk of developing hematoma at injection sites. I discussed with patient and recommend patient to try over-the-counter sublingual vitamin B12 lozenges 2000 MCG daily.   #Cirrhosis/splenomegaly/history of varices I encourage patient to continue follow-up with gastroenterology for liver cirrhosis and [place] ultrasound surveillance every 6 months.  Patient voices understanding.  She informs me that she has upcoming appointment with gastroenterology.   Follow up  4 months We spent sufficient time to discuss many aspect of care, questions were answered to patient's satisfaction.   Earlie Server, MD, PhD Hematology Oncology [facility] at [facility] Pager- [phone] 03/04/2021

## 2021-03-18 DIAGNOSIS — Z79891 Long term (current) use of opiate analgesic: Secondary | ICD-10-CM | POA: Diagnosis not present

## 2021-03-18 DIAGNOSIS — Z79899 Other long term (current) drug therapy: Secondary | ICD-10-CM | POA: Diagnosis not present

## 2021-03-18 DIAGNOSIS — M542 Cervicalgia: Secondary | ICD-10-CM | POA: Diagnosis not present

## 2021-03-18 DIAGNOSIS — G894 Chronic pain syndrome: Secondary | ICD-10-CM | POA: Diagnosis not present

## 2021-03-18 DIAGNOSIS — M5412 Radiculopathy, cervical region: Secondary | ICD-10-CM | POA: Diagnosis not present

## 2021-04-02 ENCOUNTER — Other Ambulatory Visit: Payer: Self-pay | Admitting: Internal Medicine

## 2021-04-04 ENCOUNTER — Other Ambulatory Visit: Payer: Self-pay

## 2021-04-04 ENCOUNTER — Ambulatory Visit (INDEPENDENT_AMBULATORY_CARE_PROVIDER_SITE_OTHER): Payer: Medicaid Other | Admitting: Internal Medicine

## 2021-04-04 DIAGNOSIS — D709 Neutropenia, unspecified: Secondary | ICD-10-CM | POA: Diagnosis not present

## 2021-04-04 DIAGNOSIS — D696 Thrombocytopenia, unspecified: Secondary | ICD-10-CM

## 2021-04-04 DIAGNOSIS — K766 Portal hypertension: Secondary | ICD-10-CM | POA: Diagnosis not present

## 2021-04-04 DIAGNOSIS — I25118 Atherosclerotic heart disease of native coronary artery with other forms of angina pectoris: Secondary | ICD-10-CM | POA: Diagnosis not present

## 2021-04-04 DIAGNOSIS — F1920 Other psychoactive substance dependence, uncomplicated: Secondary | ICD-10-CM

## 2021-04-04 DIAGNOSIS — J329 Chronic sinusitis, unspecified: Secondary | ICD-10-CM

## 2021-04-04 DIAGNOSIS — G8929 Other chronic pain: Secondary | ICD-10-CM | POA: Diagnosis not present

## 2021-04-04 DIAGNOSIS — I10 Essential (primary) hypertension: Secondary | ICD-10-CM | POA: Diagnosis not present

## 2021-04-04 LAB — POC COVID19 BINAXNOW: SARS Coronavirus 2 Ag: NEGATIVE

## 2021-04-04 MED ORDER — AZITHROMYCIN 250 MG PO TABS: ORAL_TABLET | ORAL | 0 refills | Status: DC

## 2021-04-07 ENCOUNTER — Ambulatory Visit (INDEPENDENT_AMBULATORY_CARE_PROVIDER_SITE_OTHER): Payer: Medicaid Other | Admitting: Internal Medicine

## 2021-04-07 ENCOUNTER — Other Ambulatory Visit: Payer: Self-pay

## 2021-04-07 ENCOUNTER — Encounter: Payer: Self-pay | Admitting: Internal Medicine

## 2021-04-07 VITALS — BP 130/81 | HR 78 | Ht <= 58 in | Wt 205.6 lb

## 2021-04-07 DIAGNOSIS — E039 Hypothyroidism, unspecified: Secondary | ICD-10-CM | POA: Diagnosis not present

## 2021-04-07 DIAGNOSIS — R1312 Dysphagia, oropharyngeal phase: Secondary | ICD-10-CM

## 2021-04-07 DIAGNOSIS — J329 Chronic sinusitis, unspecified: Secondary | ICD-10-CM

## 2021-04-07 DIAGNOSIS — I10 Essential (primary) hypertension: Secondary | ICD-10-CM | POA: Diagnosis not present

## 2021-04-07 HISTORY — DX: Chronic sinusitis, unspecified: J32.9

## 2021-04-07 HISTORY — DX: Dysphagia, oropharyngeal phase: R13.12

## 2021-04-07 MED ORDER — DOXYCYCLINE HYCLATE 100 MG PO TABS: 100.0000 mg | ORAL_TABLET | Freq: Two times a day (BID) | ORAL | 0 refills | Status: DC

## 2021-04-07 NOTE — Assessment & Plan Note (Signed)
Patient blood pressure is normal patient denies any chest pain or shortness of breath there is no history of palpitation or paroxysmal nocturnal dyspnea   patient was advised to follow low-salt low-cholesterol diet  I reviewed the results of Sprint trial  ideally I want to keep systolic blood pressure below 130 mmHg, patient was asked to check blood pressure one times a week and give me a report on that.  Patient will be follow-up in 3 months  or earlier as needed, patient will call me back for any change in the cardiovascular symptoms

## 2021-04-07 NOTE — Addendum Note (Signed)
Addended by: Alois Cliche on: 04/07/2021 12:37 PM   Modules accepted: Orders

## 2021-04-07 NOTE — Progress Notes (Signed)
Established Patient Office Visit  Subjective:  Patient ID: Brandi Bright, female    DOB: Dec 22, 1970  Age: 50 y.o. MRN: [id number]  CC:  Chief Complaint  Patient presents with  . Sinusitis    HPI  Brandi Bright presents for sinusitis in the last 1 week.  She complains of wheezing and congestion.  Denies any history of fever or chills.  She has been on Z-Pak but is not helping her at all.  She is not coughing up any sputum.  Past Medical History:  Diagnosis Date  . Arthritis   . Back pain   . Gout   . Hypothyroidism   . IDA (iron deficiency anemia) 01/16/2020  . MI, old   . Thyroid disease     Past Surgical History:  Procedure Laterality Date  . CARDIAC CATHETERIZATION    . CESAREAN SECTION    . COLONOSCOPY WITH PROPOFOL N/A 03/16/2018   Procedure: COLONOSCOPY WITH PROPOFOL;  Surgeon: Virgel Manifold, MD;  Location: [facility] ENDOSCOPY;  Service: Endoscopy;  Laterality: N/A;  . ESOPHAGOGASTRODUODENOSCOPY N/A 10/11/2019   Procedure: ESOPHAGOGASTRODUODENOSCOPY (EGD);  Surgeon: Lin Landsman, MD;  Location: [facility] ENDOSCOPY;  Service: Gastroenterology;  Laterality: N/A;  . ESOPHAGOGASTRODUODENOSCOPY (EGD) WITH PROPOFOL N/A 10/27/2018   Procedure: ESOPHAGOGASTRODUODENOSCOPY (EGD) WITH PROPOFOL;  Surgeon: Virgel Manifold, MD;  Location: [facility] ENDOSCOPY;  Service: Endoscopy;  Laterality: N/A;  . ESOPHAGOGASTRODUODENOSCOPY (EGD) WITH PROPOFOL N/A 06/25/2019   Procedure: ESOPHAGOGASTRODUODENOSCOPY (EGD) WITH PROPOFOL;  Surgeon: Jonathon Bellows, MD;  Location: [facility] ENDOSCOPY;  Service: Gastroenterology;  Laterality: N/A;  . ESOPHAGOGASTRODUODENOSCOPY (EGD) WITH PROPOFOL N/A 09/12/2019   Procedure: ESOPHAGOGASTRODUODENOSCOPY (EGD) WITH PROPOFOL;  Surgeon: Lucilla Lame, MD;  Location: [facility] ENDOSCOPY;  Service: Endoscopy;  Laterality: N/A;  . ESOPHAGOGASTRODUODENOSCOPY (EGD) WITH PROPOFOL N/A 11/20/2019   Procedure: ESOPHAGOGASTRODUODENOSCOPY (EGD) WITH PROPOFOL;  Surgeon: Virgel Manifold, MD;  Location: [facility] ENDOSCOPY;  Service: Endoscopy;  Laterality: N/A;  . ESOPHAGOGASTRODUODENOSCOPY (EGD) WITH PROPOFOL N/A 11/23/2019   Procedure: ESOPHAGOGASTRODUODENOSCOPY (EGD) WITH PROPOFOL;  Surgeon: Virgel Manifold, MD;  Location: [facility] ENDOSCOPY;  Service: Endoscopy;  Laterality: N/A;  . ESOPHAGOGASTRODUODENOSCOPY (EGD) WITH PROPOFOL N/A 01/03/2020   Procedure: ESOPHAGOGASTRODUODENOSCOPY (EGD) WITH PROPOFOL;  Surgeon: Lin Landsman, MD;  Location: [facility] ENDOSCOPY;  Service: Endoscopy;  Laterality: N/A;  . TUBAL LIGATION      Family History  Problem Relation Age of Onset  . Hypertension Other   . CAD Father   . Colon cancer Father   . Lung cancer Father   . Pancreatic cancer Brother     Social History   Socioeconomic History  . Marital status: Single    Spouse name: Not on file  . Number of children: Not on file  . Years of education: Not on file  . Highest education level: Not on file  Occupational History  . Not on file  Tobacco Use  . Smoking status: Never Smoker  . Smokeless tobacco: Never Used  Vaping Use  . Vaping Use: Never used  Substance and Sexual Activity  . Alcohol use: Yes    Comment: rarely  . Drug use: Never  . Sexual activity: Not Currently  Other Topics Concern  . Not on file  Social History Narrative  . Not on file   Social Determinants of Health   Financial Resource Strain: Not on file  Food Insecurity: Not on file  Transportation Needs: Not on file  Physical Activity: Not on file  Stress: Not on file  Social Connections: Not  on file  Intimate Partner Violence: Not on file     Current Outpatient Medications:  .  aspirin EC 81 MG EC tablet, Take 1 tablet (81 mg total) by mouth daily., Disp: 90 tablet, Rfl: 0 .  azithromycin (ZITHROMAX) 250 MG tablet, Take 2 tablets on day 1, then 1 tablet daily on days 2 through 5, Disp: 6 tablet, Rfl: 0 .  cyanocobalamin ([employer] VITAMIN B12) 1000 MCG tablet, Take 1 tablet (1,000 mcg  total) by mouth daily., Disp: 90 tablet, Rfl: 1 .  cyclobenzaprine (FLEXERIL) 10 MG tablet, Take 10 mg by mouth daily as needed., Disp: , Rfl:  .  docusate sodium (COLACE) 100 MG capsule, Take 1 capsule (100 mg total) by mouth 2 (two) times daily., Disp: 60 capsule, Rfl: 2 .  doxycycline (VIBRA-TABS) 100 MG tablet, Take 1 tablet (100 mg total) by mouth 2 (two) times daily., Disp: 14 tablet, Rfl: 0 .  ferrous sulfate 325 (65 FE) MG EC tablet, TAKE 1 TABLET BY MOUTH EVERY DAY WITH BREAKFAST, Disp: 30 tablet, Rfl: 2 .  hydrocortisone (ANUSOL-HC) 25 MG suppository, Place 1 suppository (25 mg total) rectally every 12 (twelve) hours., Disp: 12 suppository, Rfl: 1 .  lactulose (CHRONULAC) 10 GM/15ML solution, TAKE 15 MLS (10 G TOTAL) BY MOUTH DAILY FOR 30 DAYS., Disp: 473 mL, Rfl: 3 .  nitroGLYCERIN (NITROLINGUAL) 0.4 MG/SPRAY spray, Place 1 spray under the tongue every 5 (five) minutes x 3 doses as needed for chest pain., Disp: 12 g, Rfl: 2 .  Oxycodone HCl 10 MG TABS, Take 10 mg by mouth 4 (four) times daily as needed., Disp: , Rfl:  .  oxyCODONE-acetaminophen (PERCOCET/ROXICET) 5-325 MG tablet, Take 1 tablet by mouth 4 (four) times daily., Disp: , Rfl:  .  SYNTHROID 150 MCG tablet, TAKE 1 TABLET BY MOUTH DAILY, 30 MINUTES BEFORE MEALS., Disp: 30 tablet, Rfl: 1   Allergies  Allergen Reactions  . Vicodin [Hydrocodone-Acetaminophen] Rash  . Amoxicillin Rash and Other (See Comments)    Has patient had a PCN reaction causing immediate rash, facial/tongue/throat swelling, SOB or lightheadedness with hypotension: Yes Has patient had a PCN reaction causing severe rash involving mucus membranes or skin necrosis: No Has patient had a PCN reaction that required hospitalization: No Has patient had a PCN reaction occurring within the last 10 years: No If all of the above answers are "NO", then may proceed with Cephalosporin use.   . Gabapentin Rash    ROS Review of Systems  Constitutional: Negative.    HENT: Positive for congestion, sinus pain and sneezing. Negative for mouth sores and rhinorrhea.   Eyes: Negative.   Respiratory: Negative.   Cardiovascular: Negative.   Gastrointestinal: Negative.   Endocrine: Negative.   Genitourinary: Negative.   Musculoskeletal: Negative.   Skin: Negative.   Allergic/Immunologic: Negative.   Neurological: Negative.   Hematological: Negative.   Psychiatric/Behavioral: Negative.   All other systems reviewed and are negative.     Objective:    Physical Exam Vitals reviewed.  Constitutional:      Appearance: Normal appearance.  HENT:     Mouth/Throat:     Mouth: Mucous membranes are moist.  Eyes:     Pupils: Pupils are equal, round, and reactive to light.  Neck:     Vascular: No carotid bruit.  Cardiovascular:     Rate and Rhythm: Normal rate and regular rhythm.     Pulses: Normal pulses.     Heart sounds: Normal heart sounds.  Pulmonary:  Effort: Pulmonary effort is normal.     Breath sounds: Normal breath sounds.  Abdominal:     General: Bowel sounds are normal.     Palpations: Abdomen is soft. There is no hepatomegaly, splenomegaly or mass.     Tenderness: There is no abdominal tenderness.     Hernia: No hernia is present.  Musculoskeletal:        General: No tenderness.     Cervical back: Neck supple.     Right lower leg: No edema.     Left lower leg: No edema.  Skin:    Findings: No rash.  Neurological:     Mental Status: She is alert and oriented to person, place, and time.     Motor: No weakness.  Psychiatric:        Mood and Affect: Mood and affect normal.        Behavior: Behavior normal.     BP 130/81   Pulse 78   Ht 4\' 9"  (1.448 m)   Wt 205 lb 9.6 oz (93.3 kg)   LMP  (LMP Unknown) Comment: last menstrual 9 years ago, tubal ligation  BMI 44.49 kg/m  Wt Readings from Last 3 Encounters:  04/07/21 205 lb 9.6 oz (93.3 kg)  03/04/21 209 lb 11.2 oz (95.1 kg)  12/24/20 204 lb 14.4 oz (92.9 kg)     Health  Maintenance Due  Topic Date Due  . COVID-19 Vaccine (1) Never done  . TETANUS/TDAP  Never done  . PAP SMEAR-Modifier  Never done  . MAMMOGRAM  Never done    There are no preventive care reminders to display for this patient.  Lab Results  Component Value Date   TSH 2.04 11/29/2020   Lab Results  Component Value Date   WBC 1.9 (L) 03/03/2021   HGB 13.1 03/03/2021   HCT 37.3 03/03/2021   MCV 88.2 03/03/2021   PLT 36 (L) 03/03/2021   Lab Results  Component Value Date   NA 139 03/03/2021   K 4.1 03/03/2021   CO2 24 03/03/2021   GLUCOSE 105 (H) 03/03/2021   BUN 10 03/03/2021   CREATININE 0.49 03/03/2021   BILITOT 1.4 (H) 03/03/2021   ALKPHOS 90 03/03/2021   AST 35 03/03/2021   ALT 22 03/03/2021   PROT 6.8 03/03/2021   ALBUMIN 3.5 03/03/2021   CALCIUM 8.9 03/03/2021   ANIONGAP 9 03/03/2021   Lab Results  Component Value Date   CHOL 107 12/29/2017   Lab Results  Component Value Date   HDL 21 (L) 08/25/2013   Lab Results  Component Value Date   LDLCALC 67 08/25/2013   Lab Results  Component Value Date   TRIG 107 08/25/2013   No results found for: CHOLHDL Lab Results  Component Value Date   HGBA1C 4.9 12/29/2017      Assessment & Plan:   Problem List Items Addressed This Visit      Cardiovascular and Mediastinum   Essential hypertension    Patient blood pressure is normal patient denies any chest pain or shortness of breath there is no history of palpitation or paroxysmal nocturnal dyspnea   patient was advised to follow low-salt low-cholesterol diet  I reviewed the results of Sprint trial  ideally I want to keep systolic blood pressure below 130 mmHg, patient was asked to check blood pressure one times a week and give me a report on that.  Patient will be follow-up in 3 months  or earlier as needed, patient will call  me back for any change in the cardiovascular symptoms           Respiratory   Sinusitis - Primary    Patient was started on  doxycycline.      Relevant Medications   doxycycline (VIBRA-TABS) 100 MG tablet   Oropharyngeal dysphagia    Stable at the present time.        Endocrine   Acquired hypothyroidism    Patient takes his medicines regularly.         Meds ordered this encounter  Medications  . doxycycline (VIBRA-TABS) 100 MG tablet    Sig: Take 1 tablet (100 mg total) by mouth 2 (two) times daily.    Dispense:  14 tablet    Refill:  0    Follow-up: No follow-ups on file.    Cletis Athens, MD

## 2021-04-07 NOTE — Assessment & Plan Note (Signed)
Patient takes his medicines regularly.

## 2021-04-07 NOTE — Assessment & Plan Note (Signed)
Patient was started on doxycycline.

## 2021-04-07 NOTE — Assessment & Plan Note (Signed)
Stable at the present time. 

## 2021-04-08 LAB — CBC WITH DIFFERENTIAL/PLATELET
Absolute Monocytes: 169 cells/uL — ABNORMAL LOW (ref 200–950)
Basophils Absolute: 31 cells/uL (ref 0–200)
Basophils Relative: 1.4 %
Eosinophils Absolute: 70 cells/uL (ref 15–500)
Eosinophils Relative: 3.2 %
HCT: 37 % (ref 35.0–45.0)
Hemoglobin: 12.6 g/dL (ref 11.7–15.5)
Lymphs Abs: 849 cells/uL — ABNORMAL LOW (ref 850–3900)
MCH: 31 pg (ref 27.0–33.0)
MCHC: 34.1 g/dL (ref 32.0–36.0)
MCV: 90.9 fL (ref 80.0–100.0)
MPV: 11.6 fL (ref 7.5–12.5)
Monocytes Relative: 7.7 %
Neutro Abs: 1080 cells/uL — ABNORMAL LOW (ref 1500–7800)
Neutrophils Relative %: 49.1 %
Platelets: 45 10*3/uL — ABNORMAL LOW (ref 140–400)
RBC: 4.07 10*6/uL (ref 3.80–5.10)
RDW: 12.7 % (ref 11.0–15.0)
Total Lymphocyte: 38.6 %
WBC: 2.2 10*3/uL — ABNORMAL LOW (ref 3.8–10.8)

## 2021-04-09 ENCOUNTER — Ambulatory Visit (INDEPENDENT_AMBULATORY_CARE_PROVIDER_SITE_OTHER): Payer: Medicaid Other | Admitting: Gastroenterology

## 2021-04-09 ENCOUNTER — Other Ambulatory Visit: Payer: Self-pay

## 2021-04-09 ENCOUNTER — Encounter: Payer: Self-pay | Admitting: Gastroenterology

## 2021-04-09 VITALS — BP 150/90 | HR 79 | Temp 98.1°F | Ht <= 58 in | Wt 205.0 lb

## 2021-04-09 DIAGNOSIS — K7469 Other cirrhosis of liver: Secondary | ICD-10-CM

## 2021-04-09 DIAGNOSIS — Z8619 Personal history of other infectious and parasitic diseases: Secondary | ICD-10-CM | POA: Diagnosis not present

## 2021-04-09 DIAGNOSIS — R1310 Dysphagia, unspecified: Secondary | ICD-10-CM

## 2021-04-09 MED ORDER — LACTULOSE 10 GM/15ML PO SOLN: ORAL | 0 refills | Status: DC

## 2021-04-09 NOTE — Progress Notes (Signed)
[name], MD [address]  [place]  [place], [place] [address]  Main: [phone]  Fax: [phone]   Primary Care Physician: Cletis Athens, MD  Chief complaint: Dysphagia   HPI: Brandi Bright is a 50 y.o. female with history of cirrhosis, varices, here for follow-up.  Last seen in clinic in November 2020 and has not followed up since then.  Does report recent recurrence of solid food dysphagia for the last 1 to 2 months.  Has been out of her lactulose and has noticed some constipation and intermittent bright red blood per rectum, with small blood streaks on toilet paper only over the last few weeks.  Previous history:  Last upper endoscopy in January 2021 by Dr. Marius Ditch with portal hypertensive gastropathy seen, small esophageal varices with no banding needed.  Repeat EGD was recommended in 3 months for surveillance.  Upper endoscopy Dec 2020, showed grade 2 esophageal varices, portal hypertensive gastropathy.  4 bands were placed.  Scarring from previous treatments seen.  History of Hep C - treated with Epclusa 2020    Current Outpatient Medications  Medication Sig Dispense Refill  . Biotin 10 MG CAPS Take 1 capsule by mouth daily.    . cyanocobalamin ([employer] VITAMIN B12) 1000 MCG tablet Take 1 tablet (1,000 mcg total) by mouth daily. 90 tablet 1  . cyclobenzaprine (FLEXERIL) 10 MG tablet Take 10 mg by mouth daily as needed.    . docusate sodium (COLACE) 100 MG capsule Take 1 capsule (100 mg total) by mouth 2 (two) times daily. 60 capsule 2  . ferrous sulfate 325 (65 FE) MG EC tablet TAKE 1 TABLET BY MOUTH EVERY DAY WITH BREAKFAST 30 tablet 2  . Oxycodone HCl 10 MG TABS Take 10 mg by mouth 4 (four) times daily as needed.    Marland Kitchen SYNTHROID 150 MCG tablet TAKE 1 TABLET BY MOUTH DAILY, 30 MINUTES BEFORE MEALS. 30 tablet 1  . hydrocortisone (ANUSOL-HC) 25 MG suppository Place 1 suppository (25 mg total) rectally every 12 (twelve) hours. (Patient not taking: Reported on  04/09/2021) 12 suppository 1  . hydrOXYzine (ATARAX/VISTARIL) 25 MG tablet Take 25 mg by mouth every 8 (eight) hours as needed. (Patient not taking: Reported on 04/09/2021)    . lactulose (CHRONULAC) 10 GM/15ML solution TAKE 15 MLS (10 G TOTAL) BY MOUTH DAILY FOR 30 DAYS. (Patient not taking: Reported on 04/09/2021) 473 mL 3  . nitroGLYCERIN (NITROLINGUAL) 0.4 MG/SPRAY spray Place 1 spray under the tongue every 5 (five) minutes x 3 doses as needed for chest pain. (Patient not taking: Reported on 04/09/2021) 12 g 2   No current facility-administered medications for this visit.    Allergies as of 04/09/2021 - Review Complete 04/07/2021  Allergen Reaction Noted  . Vicodin [hydrocodone-acetaminophen] Rash 01/11/2016  . Amoxicillin Rash and Other (See Comments) 01/11/2016  . Gabapentin Rash 01/11/2016    ROS:  General: Negative for anorexia, weight loss, fever, chills, fatigue, weakness. ENT: Negative for hoarseness, difficulty swallowing , nasal congestion. CV: Negative for chest pain, angina, palpitations, dyspnea on exertion, peripheral edema.  Respiratory: Negative for dyspnea at rest, dyspnea on exertion, cough, sputum, wheezing.  GI: See history of present illness. GU:  Negative for dysuria, hematuria, urinary incontinence, urinary frequency, nocturnal urination.  Endo: Negative for unusual weight change.    Physical Examination:   BP (!) 150/90   Pulse 79   Temp 98.1 F (36.7 C) (Oral)   Ht 4\' 9"  (1.448 m)   Wt 205  lb (93 kg)   LMP  (LMP Unknown) Comment: last menstrual 9 years ago, tubal ligation  BMI 44.36 kg/m   General: Well-nourished, well-developed in no acute distress.  Eyes: No icterus. Conjunctivae pink. Mouth: Oropharyngeal mucosa moist and pink , no lesions erythema or exudate. Neck: Supple, Trachea midline Abdomen: Bowel sounds are normal, nontender, nondistended, no hepatosplenomegaly or masses, no abdominal bruits or hernia , no rebound or guarding.    Extremities: No lower extremity edema. No clubbing or deformities. Neuro: Alert and oriented x 3.  Grossly intact. Skin: Warm and dry, no jaundice.   Psych: Alert and cooperative, normal mood and affect.   Labs: CMP     Component Value Date/Time   NA 139 03/03/2021 1113   NA 144 11/06/2019 1418   NA 137 08/25/2013 0617   K 4.1 03/03/2021 1113   K 4.0 08/25/2013 0617   CL 106 03/03/2021 1113   CL 104 08/25/2013 0617   CO2 24 03/03/2021 1113   CO2 29 08/25/2013 0617   GLUCOSE 105 (H) 03/03/2021 1113   GLUCOSE 97 08/25/2013 0617   BUN 10 03/03/2021 1113   BUN 8 11/06/2019 1418   BUN 9 08/25/2013 0617   CREATININE 0.49 03/03/2021 1113   CREATININE 0.52 08/26/2020 1252   CALCIUM 8.9 03/03/2021 1113   CALCIUM 8.8 08/25/2013 0617   PROT 6.8 03/03/2021 1113   PROT 7.2 11/06/2019 1418   PROT 8.8 (H) 05/30/2013 1242   ALBUMIN 3.5 03/03/2021 1113   ALBUMIN 3.7 (L) 11/06/2019 1418   ALBUMIN 4.4 05/30/2013 1242   AST 35 03/03/2021 1113   AST 163 (H) 05/30/2013 1242   ALT 22 03/03/2021 1113   ALT 144 (H) 05/30/2013 1242   ALKPHOS 90 03/03/2021 1113   ALKPHOS 89 05/30/2013 1242   BILITOT 1.4 (H) 03/03/2021 1113   BILITOT 1.1 11/06/2019 1418   BILITOT 0.8 05/30/2013 1242   GFRNONAA >60 03/03/2021 1113   GFRNONAA 111 08/26/2020 1252   GFRAA 129 08/26/2020 1252   Lab Results  Component Value Date   WBC 2.2 (L) 04/07/2021   HGB 12.6 04/07/2021   HCT 37.0 04/07/2021   MCV 90.9 04/07/2021   PLT 45 (L) 04/07/2021    Imaging Studies: No results found.  Assessment and Plan:   Brandi Bright is a 50 y.o. y/o female with hx of Hep C cirrhosis, varices, Portal hypertensive gastropathy here for follow up  Hep C treated with Epclusa in 2020. Check RNA  Repeat RUQ U/S  Repeat EGD for dysphagia and history of varices  Due to for polyp surveillance colonoscopy and assess for underlying hemorrhoids during the procedure  Refill lactulose     Dr [name]

## 2021-04-10 LAB — HCV RNA QUANT: Hepatitis C Quantitation: NOT DETECTED IU/mL

## 2021-04-10 NOTE — Progress Notes (Signed)
Established Patient Office Visit  Subjective:  Patient ID: Brandi Bright, female    DOB: 02/02/1971  Age: 50 y.o. MRN: [id number]  CC: No chief complaint on file.   HPI  [name] presents forcheck up  Past Medical History:  Diagnosis Date  . Arthritis   . Back pain   . Gout   . Hypothyroidism   . IDA (iron deficiency anemia) 01/16/2020  . MI, old   . Thyroid disease     Past Surgical History:  Procedure Laterality Date  . CARDIAC CATHETERIZATION    . CESAREAN SECTION    . COLONOSCOPY WITH PROPOFOL N/A 03/16/2018   Procedure: COLONOSCOPY WITH PROPOFOL;  Surgeon: Virgel Manifold, MD;  Location: [facility] ENDOSCOPY;  Service: Endoscopy;  Laterality: N/A;  . ESOPHAGOGASTRODUODENOSCOPY N/A 10/11/2019   Procedure: ESOPHAGOGASTRODUODENOSCOPY (EGD);  Surgeon: Lin Landsman, MD;  Location: [facility] ENDOSCOPY;  Service: Gastroenterology;  Laterality: N/A;  . ESOPHAGOGASTRODUODENOSCOPY (EGD) WITH PROPOFOL N/A 10/27/2018   Procedure: ESOPHAGOGASTRODUODENOSCOPY (EGD) WITH PROPOFOL;  Surgeon: Virgel Manifold, MD;  Location: [facility] ENDOSCOPY;  Service: Endoscopy;  Laterality: N/A;  . ESOPHAGOGASTRODUODENOSCOPY (EGD) WITH PROPOFOL N/A 06/25/2019   Procedure: ESOPHAGOGASTRODUODENOSCOPY (EGD) WITH PROPOFOL;  Surgeon: Jonathon Bellows, MD;  Location: [facility] ENDOSCOPY;  Service: Gastroenterology;  Laterality: N/A;  . ESOPHAGOGASTRODUODENOSCOPY (EGD) WITH PROPOFOL N/A 09/12/2019   Procedure: ESOPHAGOGASTRODUODENOSCOPY (EGD) WITH PROPOFOL;  Surgeon: Lucilla Lame, MD;  Location: [facility] ENDOSCOPY;  Service: Endoscopy;  Laterality: N/A;  . ESOPHAGOGASTRODUODENOSCOPY (EGD) WITH PROPOFOL N/A 11/20/2019   Procedure: ESOPHAGOGASTRODUODENOSCOPY (EGD) WITH PROPOFOL;  Surgeon: Virgel Manifold, MD;  Location: [facility] ENDOSCOPY;  Service: Endoscopy;  Laterality: N/A;  . ESOPHAGOGASTRODUODENOSCOPY (EGD) WITH PROPOFOL N/A 11/23/2019   Procedure: ESOPHAGOGASTRODUODENOSCOPY (EGD) WITH PROPOFOL;  Surgeon: Virgel Manifold, MD;  Location: [facility] ENDOSCOPY;  Service: Endoscopy;  Laterality: N/A;  . ESOPHAGOGASTRODUODENOSCOPY (EGD) WITH PROPOFOL N/A 01/03/2020   Procedure: ESOPHAGOGASTRODUODENOSCOPY (EGD) WITH PROPOFOL;  Surgeon: Lin Landsman, MD;  Location: [facility] ENDOSCOPY;  Service: Endoscopy;  Laterality: N/A;  . TUBAL LIGATION      Family History  Problem Relation Age of Onset  . Hypertension Other   . CAD Father   . Colon cancer Father   . Lung cancer Father   . Pancreatic cancer Brother     Social History   Socioeconomic History  . Marital status: Single    Spouse name: Not on file  . Number of children: Not on file  . Years of education: Not on file  . Highest education level: Not on file  Occupational History  . Not on file  Tobacco Use  . Smoking status: Never Smoker  . Smokeless tobacco: Never Used  Vaping Use  . Vaping Use: Never used  Substance and Sexual Activity  . Alcohol use: Yes    Comment: rarely  . Drug use: Never  . Sexual activity: Not Currently  Other Topics Concern  . Not on file  Social History Narrative  . Not on file   Social Determinants of Health   Financial Resource Strain: Not on file  Food Insecurity: Not on file  Transportation Needs: Not on file  Physical Activity: Not on file  Stress: Not on file  Social Connections: Not on file  Intimate Partner Violence: Not on file     Current Outpatient Medications:  .  Biotin 10 MG CAPS, Take 1 capsule by mouth daily., Disp: , Rfl:  .  cyanocobalamin ([employer] VITAMIN B12) 1000 MCG tablet, Take 1 tablet (1,000 mcg  total) by mouth daily., Disp: 90 tablet, Rfl: 1 .  cyclobenzaprine (FLEXERIL) 10 MG tablet, Take 10 mg by mouth daily as needed., Disp: , Rfl:  .  docusate sodium (COLACE) 100 MG capsule, Take 1 capsule (100 mg total) by mouth 2 (two) times daily., Disp: 60 capsule, Rfl: 2 .  ferrous sulfate 325 (65 FE) MG EC tablet, TAKE 1 TABLET BY MOUTH EVERY DAY WITH BREAKFAST, Disp: 30 tablet, Rfl: 2 .   hydrocortisone (ANUSOL-HC) 25 MG suppository, Place 1 suppository (25 mg total) rectally every 12 (twelve) hours. (Patient not taking: Reported on 04/09/2021), Disp: 12 suppository, Rfl: 1 .  hydrOXYzine (ATARAX/VISTARIL) 25 MG tablet, Take 25 mg by mouth every 8 (eight) hours as needed. (Patient not taking: Reported on 04/09/2021), Disp: , Rfl:  .  lactulose (CHRONULAC) 10 GM/15ML solution, TAKE 15 MLS (10 G TOTAL) BY MOUTH DAILY FOR 30 DAYS., Disp: 1892 mL, Rfl: 0 .  nitroGLYCERIN (NITROLINGUAL) 0.4 MG/SPRAY spray, Place 1 spray under the tongue every 5 (five) minutes x 3 doses as needed for chest pain. (Patient not taking: Reported on 04/09/2021), Disp: 12 g, Rfl: 2 .  Oxycodone HCl 10 MG TABS, Take 10 mg by mouth 4 (four) times daily as needed., Disp: , Rfl:  .  SYNTHROID 150 MCG tablet, TAKE 1 TABLET BY MOUTH DAILY, 30 MINUTES BEFORE MEALS., Disp: 30 tablet, Rfl: 1   Allergies  Allergen Reactions  . Vicodin [Hydrocodone-Acetaminophen] Rash  . Amoxicillin Rash and Other (See Comments)    Has patient had a PCN reaction causing immediate rash, facial/tongue/throat swelling, SOB or lightheadedness with hypotension: Yes Has patient had a PCN reaction causing severe rash involving mucus membranes or skin necrosis: No Has patient had a PCN reaction that required hospitalization: No Has patient had a PCN reaction occurring within the last 10 years: No If all of the above answers are "NO", then may proceed with Cephalosporin use.   . Gabapentin Rash    ROS Review of Systems  Constitutional: Negative.   HENT: Negative.   Eyes: Negative.   Respiratory: Negative.   Cardiovascular: Negative.   Gastrointestinal: Negative.   Endocrine: Negative.   Genitourinary: Negative.   Musculoskeletal: Negative.   Skin: Negative.   Allergic/Immunologic: Negative.   Neurological: Negative.   Hematological: Negative.   Psychiatric/Behavioral: Negative.   All other systems reviewed and are negative.      Objective:    Physical Exam Vitals reviewed.  Constitutional:      Appearance: Normal appearance.  HENT:     Mouth/Throat:     Mouth: Mucous membranes are moist.  Eyes:     Pupils: Pupils are equal, round, and reactive to light.  Neck:     Vascular: No carotid bruit.  Cardiovascular:     Rate and Rhythm: Normal rate and regular rhythm.     Pulses: Normal pulses.     Heart sounds: Normal heart sounds.  Pulmonary:     Effort: Pulmonary effort is normal.     Breath sounds: Normal breath sounds.  Abdominal:     General: Bowel sounds are normal.     Palpations: Abdomen is soft. There is no hepatomegaly, splenomegaly or mass.     Tenderness: There is no abdominal tenderness.     Hernia: No hernia is present.  Musculoskeletal:        General: No tenderness.     Cervical back: Neck supple.     Right lower leg: No edema.     Left lower  leg: No edema.  Skin:    Findings: No rash.  Neurological:     Mental Status: She is alert and oriented to person, place, and time.     Motor: No weakness.  Psychiatric:        Mood and Affect: Mood and affect normal.        Behavior: Behavior normal.     LMP  (LMP Unknown) Comment: last menstrual 9 years ago, tubal ligation Wt Readings from Last 3 Encounters:  04/09/21 205 lb (93 kg)  04/07/21 205 lb 9.6 oz (93.3 kg)  03/04/21 209 lb 11.2 oz (95.1 kg)     Health Maintenance Due  Topic Date Due  . COVID-19 Vaccine (1) Never done  . TETANUS/TDAP  Never done  . PAP SMEAR-Modifier  Never done  . MAMMOGRAM  Never done    There are no preventive care reminders to display for this patient.  Lab Results  Component Value Date   TSH 2.04 11/29/2020   Lab Results  Component Value Date   WBC 2.2 (L) 04/07/2021   HGB 12.6 04/07/2021   HCT 37.0 04/07/2021   MCV 90.9 04/07/2021   PLT 45 (L) 04/07/2021   Lab Results  Component Value Date   NA 139 03/03/2021   K 4.1 03/03/2021   CO2 24 03/03/2021   GLUCOSE 105 (H) 03/03/2021   BUN  10 03/03/2021   CREATININE 0.49 03/03/2021   BILITOT 1.4 (H) 03/03/2021   ALKPHOS 90 03/03/2021   AST 35 03/03/2021   ALT 22 03/03/2021   PROT 6.8 03/03/2021   ALBUMIN 3.5 03/03/2021   CALCIUM 8.9 03/03/2021   ANIONGAP 9 03/03/2021   Lab Results  Component Value Date   CHOL 107 12/29/2017   Lab Results  Component Value Date   HDL 21 (L) 08/25/2013   Lab Results  Component Value Date   LDLCALC 67 08/25/2013   Lab Results  Component Value Date   TRIG 107 08/25/2013   No results found for: [facility] Lab Results  Component Value Date   HGBA1C 4.9 12/29/2017      Assessment & Plan:   Problem List Items Addressed This Visit      Cardiovascular and Mediastinum   Coronary artery disease    Patient not having any angina at the present time.      Essential hypertension    Patient blood pressure is normal patient denies any chest pain or shortness of breath there is no history of palpitation or paroxysmal nocturnal dyspnea   patient was advised to follow low-salt low-cholesterol diet    ideally I want to keep systolic blood pressure below 130 mmHg, patient was asked to check blood pressure one times a week and give me a report on that.  Patient will be follow-up in 3 months  or earlier as needed, patient will call me back for any change in the cardiovascular symptoms         Portal hypertension ([place])    Patient is being followed up with GI specialist.        Respiratory   Sinusitis - Primary    Take Claritin 10 mg p.o. daily.      Relevant Orders   POC COVID-19 (Completed)     Other   Thrombocytopenia ([place])    For follow-up with hematologist, normal.  Her COVID is 45,000.  WBC count was 2.2 glucose 105      Leukopenia   Chronic pain with drug dependence ([place])  Meds ordered this encounter  Medications  . DISCONTD: azithromycin (ZITHROMAX) 250 MG tablet    Sig: Take 2 tablets on day 1, then 1 tablet daily on days 2 through 5    Dispense:  6 tablet     Refill:  0    Follow-up: No follow-ups on file.    Cletis Athens, MD

## 2021-04-11 ENCOUNTER — Encounter: Payer: Self-pay | Admitting: Internal Medicine

## 2021-04-11 NOTE — Assessment & Plan Note (Signed)
Patient not having any angina at the present time.

## 2021-04-11 NOTE — Assessment & Plan Note (Addendum)
For follow-up with hematologist, normal.  Her COVID is 45,000.  WBC count was 2.2 glucose 105

## 2021-04-11 NOTE — Assessment & Plan Note (Signed)
Patient is being followed up with GI specialist.

## 2021-04-11 NOTE — Assessment & Plan Note (Signed)
Take Claritin 10 mg p.o. daily 

## 2021-04-11 NOTE — Assessment & Plan Note (Signed)
Patient blood pressure is normal patient denies any chest pain or shortness of breath there is no history of palpitation or paroxysmal nocturnal dyspnea   patient was advised to follow low-salt low-cholesterol diet    ideally I want to keep systolic blood pressure below 130 mmHg, patient was asked to check blood pressure one times a week and give me a report on that.  Patient will be follow-up in 3 months  or earlier as needed, patient will call me back for any change in the cardiovascular symptoms    

## 2021-04-15 ENCOUNTER — Telehealth: Payer: Self-pay

## 2021-04-15 NOTE — Telephone Encounter (Signed)
Patient does not require prior auth bc [facility] is in network. Ref id [id number]

## 2021-04-21 ENCOUNTER — Other Ambulatory Visit: Payer: Self-pay | Admitting: Oncology

## 2021-05-01 ENCOUNTER — Ambulatory Visit
Admission: RE | Admit: 2021-05-01 | Discharge: 2021-05-01 | Disposition: A | Discharge status: Home / Self Care | Payer: Medicaid Other | Source: Ambulatory Visit | Attending: Gastroenterology | Admitting: Gastroenterology

## 2021-05-01 ENCOUNTER — Other Ambulatory Visit: Payer: Self-pay

## 2021-05-01 DIAGNOSIS — K7469 Other cirrhosis of liver: Secondary | ICD-10-CM | POA: Diagnosis not present

## 2021-06-12 ENCOUNTER — Telehealth: Payer: Self-pay | Admitting: Gastroenterology

## 2021-06-12 NOTE — Telephone Encounter (Signed)
Patient wants call back to schedule colonoscopy

## 2021-06-13 ENCOUNTER — Other Ambulatory Visit: Payer: Self-pay

## 2021-06-13 DIAGNOSIS — I8500 Esophageal varices without bleeding: Secondary | ICD-10-CM

## 2021-06-13 DIAGNOSIS — R1310 Dysphagia, unspecified: Secondary | ICD-10-CM

## 2021-06-13 MED ORDER — NA SULFATE-K SULFATE-MG SULF 17.5-3.13-1.6 GM/177ML PO SOLN: 1.0000 | Freq: Once | ORAL | 0 refills | Status: AC

## 2021-06-13 NOTE — Telephone Encounter (Signed)
EGD/colon scheduled for July 27 instructions mailed and Rx sent to the pharmacy

## 2021-06-17 DIAGNOSIS — M542 Cervicalgia: Secondary | ICD-10-CM | POA: Diagnosis not present

## 2021-06-17 DIAGNOSIS — Z79899 Other long term (current) drug therapy: Secondary | ICD-10-CM | POA: Diagnosis not present

## 2021-06-17 DIAGNOSIS — M5412 Radiculopathy, cervical region: Secondary | ICD-10-CM | POA: Diagnosis not present

## 2021-06-17 DIAGNOSIS — Z79891 Long term (current) use of opiate analgesic: Secondary | ICD-10-CM | POA: Diagnosis not present

## 2021-06-17 DIAGNOSIS — G894 Chronic pain syndrome: Secondary | ICD-10-CM | POA: Diagnosis not present

## 2021-06-30 ENCOUNTER — Inpatient Hospital Stay: Payer: Medicaid Other | Attending: Oncology

## 2021-06-30 ENCOUNTER — Other Ambulatory Visit: Payer: Self-pay

## 2021-06-30 DIAGNOSIS — D709 Neutropenia, unspecified: Secondary | ICD-10-CM | POA: Insufficient documentation

## 2021-06-30 DIAGNOSIS — R161 Splenomegaly, not elsewhere classified: Secondary | ICD-10-CM | POA: Insufficient documentation

## 2021-06-30 DIAGNOSIS — D696 Thrombocytopenia, unspecified: Secondary | ICD-10-CM | POA: Insufficient documentation

## 2021-06-30 DIAGNOSIS — K7460 Unspecified cirrhosis of liver: Secondary | ICD-10-CM | POA: Insufficient documentation

## 2021-06-30 DIAGNOSIS — E538 Deficiency of other specified B group vitamins: Secondary | ICD-10-CM | POA: Insufficient documentation

## 2021-06-30 DIAGNOSIS — D500 Iron deficiency anemia secondary to blood loss (chronic): Secondary | ICD-10-CM | POA: Diagnosis present

## 2021-06-30 LAB — VITAMIN B12: Vitamin B-12: 679 pg/mL (ref 180–914)

## 2021-06-30 LAB — CBC WITH DIFFERENTIAL/PLATELET
Abs Immature Granulocytes: 0 10*3/uL (ref 0.00–0.07)
Band Neutrophils: 0 %
Basophils Absolute: 0 10*3/uL (ref 0.0–0.1)
Basophils Relative: 0 %
Blasts: 0 %
Eosinophils Absolute: 0.1 10*3/uL (ref 0.0–0.5)
Eosinophils Relative: 3 %
HCT: 36.1 % (ref 36.0–46.0)
Hemoglobin: 12.6 g/dL (ref 12.0–15.0)
Lymphocytes Relative: 35 %
Lymphs Abs: 0.8 10*3/uL (ref 0.7–4.0)
MCH: 29.8 pg (ref 26.0–34.0)
MCHC: 34.9 g/dL (ref 30.0–36.0)
MCV: 85.3 fL (ref 80.0–100.0)
Metamyelocytes Relative: 0 %
Monocytes Absolute: 0.2 10*3/uL (ref 0.1–1.0)
Monocytes Relative: 7 %
Myelocytes: 0 %
Neutro Abs: 1.1 10*3/uL — ABNORMAL LOW (ref 1.7–7.7)
Neutrophils Relative %: 55 %
Other: 0 %
Platelets: 38 10*3/uL — ABNORMAL LOW (ref 150–400)
Promyelocytes Relative: 0 %
RBC: 4.23 MIL/uL (ref 3.87–5.11)
RDW: 13 % (ref 11.5–15.5)
WBC: 2.2 10*3/uL — ABNORMAL LOW (ref 4.0–10.5)
nRBC: 0 % (ref 0.0–0.2)
nRBC: 0 /100 WBC

## 2021-07-01 ENCOUNTER — Inpatient Hospital Stay (HOSPITAL_BASED_OUTPATIENT_CLINIC_OR_DEPARTMENT_OTHER): Payer: Medicaid Other | Admitting: Oncology

## 2021-07-01 ENCOUNTER — Other Ambulatory Visit: Payer: Self-pay | Admitting: Oncology

## 2021-07-01 ENCOUNTER — Encounter: Payer: Self-pay | Admitting: Oncology

## 2021-07-01 VITALS — BP 135/98 | HR 77 | Temp 97.0°F | Resp 18 | Wt 202.0 lb

## 2021-07-01 DIAGNOSIS — K7460 Unspecified cirrhosis of liver: Secondary | ICD-10-CM | POA: Diagnosis not present

## 2021-07-01 DIAGNOSIS — R161 Splenomegaly, not elsewhere classified: Secondary | ICD-10-CM | POA: Diagnosis not present

## 2021-07-01 DIAGNOSIS — E538 Deficiency of other specified B group vitamins: Secondary | ICD-10-CM

## 2021-07-01 DIAGNOSIS — D696 Thrombocytopenia, unspecified: Secondary | ICD-10-CM

## 2021-07-01 DIAGNOSIS — D500 Iron deficiency anemia secondary to blood loss (chronic): Secondary | ICD-10-CM

## 2021-07-01 LAB — AFP TUMOR MARKER: AFP, Serum, Tumor Marker: 1.2 ng/mL (ref 0.0–9.2)

## 2021-07-01 NOTE — Progress Notes (Signed)
[facility] day:  07/01/2021  Chief Complaint: Brandi Bright is a 50 y.o. female follows up for management of iron deficiency anemia, thrombocytopenia, and leukopenia   PERTINENT HEMATOLOGY HISTORY Patient follows up with Dr. Mike Gip previously.  Establish care with me on 02/09/2019. Reviewed patient's previous medical records, labs, imaging results. # History of cirrhosis and hepatitis C.  She has a history of chronic anemia, thrombocytopenia and leukopenia dating back to 03/2012.  Platelet count has fluctuated between 54,000 - 56,000 since 12/28/2017 (previously 73,000 - 134,000).  Ferritin was 14 on 08/31/2018.    Work-up on 09/23/2018 revealed a hematocrit of 31.0, hemoglobin 10.1, MCV 87.6, platelets 46,000, WBC 1900 with an ANC of 1100.  Ferritin was 10 (low) with iron saturation 16% with a TIBC of 401.  Retic was 1.2%.  B12 was 315.  Normal studies included: folate, SPEP, and free light chain ratio.  Copper was 69 (72-166).  ANA was + with double stranded DNA antibody 13 (0-9).  TSH was 0.036 (0.35-4.5) with a free T4 1.05 (0.82-1.77).  Peripheral smear revealed variant lymphocytes.  Ferritin has been followed:  14 on 08/31/2018 and 10 on 09/23/2018.  Abdomen and pelvic CT on 07/14/2018 revealed cirrhosis with portal hypertension noted by prominent splenomegaly (18.5 x 13.8 x 8.1 cm; volume 1100 cm3), enlarged portal veins with recannulated umbilical vein, paraesophageal and perigastric varices.  There was mild thickening of the cecum and ascending colon.  EGD on 10/27/2018 revealed grade II esophageal varices.  There was erythematous mucosa in the antrum.  There was congested, erythematous, friable (with contact bleeding), granular and nodular mucosa in the gastric fundus and astric body.  There was a single gastric polyp (polypoid ulcerated antral type mucos with chronic active mucosal inflammation).  There was a normal duodenal bulb, second  portion of the duodenum and examined duodenum.  The patient's iron deficiency could be explained by her friable gastric mucosa (likely from portal hypertension).  There was no history of variceal bleeding and thus this is not a cause of her iron deficiency.  # admitted from 10/11/2019 to 10/12/2019 due to rectal bleeding EGD 10/12/2019 showed portal hypertensive gastropathy, treated with APC.  Nonbleeding large esophageal varices incompletely evaluated.  Banded. Patient follows with gastroenterology and had EGD on 11/20/2019. Banding was not done due to large amount of food in the stomach. There was plan for EGD on 11/23/2019.  Her gastroenterologist Dr. Bonna Gains contacted me to see if patient can get platelet transfusion to improve her platelet counts for banding on 11/23/2019.     INTERVAL HISTORY Brandi Bright is a 50 y.o. female who has above history reviewed by me today presents for follow up visit for management of thrombocytopenia, splenomegaly, leukopenia and anemia. Problems and complaints are listed below: Patient reports doing well.  No black or bloody stool. She follows up with gastroenterology.  She has had an abdominal ultrasound done in May 2022. No active bleeding events. She is going to have EGD and colonoscopy surveillance next week.   Review of Systems  Constitutional:  Positive for fatigue. Negative for appetite change, chills and fever.  HENT:   Negative for hearing loss and voice change.   Eyes:  Negative for eye problems.  Respiratory:  Negative for chest tightness and cough.   Cardiovascular:  Negative for chest pain.  Gastrointestinal:  Negative for abdominal distention, abdominal pain and blood in stool.  Endocrine: Negative for hot flashes.  Genitourinary:  Negative for difficulty urinating and frequency.   Musculoskeletal:  Negative for arthralgias.  Skin:  Negative for itching and rash.  Neurological:  Negative for extremity weakness.  Hematological:   Negative for adenopathy. Bruises/bleeds easily.  Psychiatric/Behavioral:  Negative for confusion.   Past Medical History:  Diagnosis Date   Arthritis    Back pain    Gout    Hypothyroidism    IDA (iron deficiency anemia) 01/16/2020   MI, old    Thyroid disease     Past Surgical History:  Procedure Laterality Date   CARDIAC CATHETERIZATION     CESAREAN SECTION     COLONOSCOPY WITH PROPOFOL N/A 03/16/2018   Procedure: COLONOSCOPY WITH PROPOFOL;  Surgeon: Virgel Manifold, MD;  Location: [facility] ENDOSCOPY;  Service: Endoscopy;  Laterality: N/A;   ESOPHAGOGASTRODUODENOSCOPY N/A 10/11/2019   Procedure: ESOPHAGOGASTRODUODENOSCOPY (EGD);  Surgeon: Lin Landsman, MD;  Location: [facility] ENDOSCOPY;  Service: Gastroenterology;  Laterality: N/A;   ESOPHAGOGASTRODUODENOSCOPY (EGD) WITH PROPOFOL N/A 10/27/2018   Procedure: ESOPHAGOGASTRODUODENOSCOPY (EGD) WITH PROPOFOL;  Surgeon: Virgel Manifold, MD;  Location: [facility] ENDOSCOPY;  Service: Endoscopy;  Laterality: N/A;   ESOPHAGOGASTRODUODENOSCOPY (EGD) WITH PROPOFOL N/A 06/25/2019   Procedure: ESOPHAGOGASTRODUODENOSCOPY (EGD) WITH PROPOFOL;  Surgeon: Jonathon Bellows, MD;  Location[facility] END[facility]COPY;  Service: Gastroenterology;  Laterality: N/A;   ESOPHAGOGASTRODUODENOSCOPY (EGD) WITH PROPOFOL N/A 09/12/2019   Procedure: ESOPHAGOGASTRODUODENOSCOPY (EGD) WITH PROPOFOL;  Surgeon: Lucilla Lame, MD;  Location: [facility] ENDOSCOPY;  Service: Endoscopy;  Laterality: N/A;   ESOPHAGOGASTRODUODENOSCOPY (EGD) WITH PROPOFOL N/A 11/20/2019   Procedure: ESOPHAGOGASTRODUODENOSCOPY (EGD) WITH PROPOFOL;  Surgeon: Virgel Manifold, MD;  Location: [facility] ENDOSCOPY;  Service: Endoscopy;  Laterality: N/A;   ESOPHAGOGASTRODUODENOSCOPY (EGD) WITH PROPOFOL N/A 11/23/2019   Procedure: ESOPHAGOGASTRODUODENOSCOPY (EGD) WITH PROPOFOL;  Surgeon: Virgel Manifold, MD;  Location: [facility] ENDOSCOPY;  Service: Endoscopy;  Laterality: N/A;   ESOPHAGOGASTRODUODENOSCOPY (EGD) WITH PROPOFOL N/A  01/03/2020   Procedure: ESOPHAGOGASTRODUODENOSCOPY (EGD) WITH PROPOFOL;  Surgeon: Lin Landsman, MD;  Location: [facility];  Service: Endoscopy;  Laterality: N/A;   TUBAL LIGATION      Family History  Problem Relation Age of Onset   Hypertension Other    CAD Father    Colon cancer Father    Lung cancer Father    Pancreatic cancer Brother    Social History   Socioeconomic History   Marital status: Single    Spouse name: Not on file   Number of children: Not on file   Years of education: Not on file   Highest education level: Not on file  Occupational History   Not on file  Tobacco Use   Smoking status: Never   Smokeless tobacco: Never  Vaping Use   Vaping Use: Never used  Substance and Sexual Activity   Alcohol use: Yes    Comment: rarely   Drug use: Never   Sexual activity: Not Currently  Other Topics Concern   Not on file  Social History Narrative   Not on file   Social Determinants of Health   Financial Resource Strain: Not on file  Food Insecurity: Not on file  Transportation Needs: Not on file  Physical Activity: Not on file  Stress: Not on file  Social Connections: Not on file  Intimate Partner Violence: Not on file    Allergies:  Allergies  Allergen Reactions   Vicodin [Hydrocodone-Acetaminophen] Rash   Amoxicillin Rash and Other (See Comments)    Has patient had a PCN reaction causing immediate rash, facial/tongue/throat swelling, SOB or lightheadedness  with hypotension: Yes Has patient had a PCN reaction causing severe rash involving mucus membranes or skin necrosis: No Has patient had a PCN reaction that required hospitalization: No Has patient had a PCN reaction occurring within the last 10 years: No If all of the above answers are "NO", then may proceed with Cephalosporin use.    Gabapentin Rash    Current Medications: Current Outpatient Medications  Medication Sig Dispense Refill   cyanocobalamin ([employer] VITAMIN B12) 1000 MCG tablet  Take 1 tablet (1,000 mcg total) by mouth daily. 90 tablet 1   ferrous sulfate 325 (65 FE) MG EC tablet TAKE 1 TABLET BY MOUTH EVERY DAY WITH BREAKFAST 30 tablet 2   lactulose (CHRONULAC) 10 GM/15ML solution TAKE 15 MLS (10 G TOTAL) BY MOUTH DAILY FOR 30 DAYS. 1892 mL 0   Oxycodone HCl 10 MG TABS Take 10 mg by mouth 4 (four) times daily as needed.     SYNTHROID 150 MCG tablet TAKE 1 TABLET BY MOUTH DAILY, 30 MINUTES BEFORE MEALS. 30 tablet 1   XTAMPZA ER 9 MG C12A Take 1 capsule by mouth every 12 (twelve) hours.     Biotin 10 MG CAPS Take 1 capsule by mouth daily. (Patient not taking: Reported on 07/01/2021)     cyclobenzaprine (FLEXERIL) 10 MG tablet Take 10 mg by mouth daily as needed. (Patient not taking: Reported on 07/01/2021)     hydrOXYzine (ATARAX/VISTARIL) 25 MG tablet Take 25 mg by mouth every 8 (eight) hours as needed. (Patient not taking: No sig reported)     nitroGLYCERIN (NITROLINGUAL) 0.4 MG/SPRAY spray Place 1 spray under the tongue every 5 (five) minutes x 3 doses as needed for chest pain. (Patient not taking: No sig reported) 12 g 2   No current facility-administered medications for this visit.     Physical Exam: Blood pressure (!) 135/98, pulse 77, temperature (!) 97 F (36.1 C), temperature source Tympanic, resp. rate 18, weight 202 lb (91.6 kg), SpO2 98 %.   Physical Exam Constitutional:      General: She is not in acute distress.    Appearance: She is not diaphoretic.  HENT:     Head: Normocephalic and atraumatic.     Nose: Nose normal.     Mouth/Throat:     Pharynx: No oropharyngeal exudate.  Eyes:     General: No scleral icterus.    Pupils: Pupils are equal, round, and reactive to light.  Cardiovascular:     Rate and Rhythm: Normal rate and regular rhythm.     Heart sounds: No murmur heard. Pulmonary:     Effort: Pulmonary effort is normal. No respiratory distress.     Breath sounds: Normal breath sounds. No rales.  Chest:     Chest wall: No tenderness.   Abdominal:     General: Bowel sounds are normal. There is no distension.     Palpations: Abdomen is soft.     Tenderness: There is no abdominal tenderness.     Comments: Splenomegaly  Musculoskeletal:        General: Normal range of motion.     Cervical back: Normal range of motion and neck supple.  Skin:    General: Skin is warm and dry.     Findings: No erythema.  Neurological:     Mental Status: She is alert and oriented to person, place, and time.     Cranial Nerves: No cranial nerve deficit.     Motor: No abnormal muscle tone.  Coordination: Coordination normal.  Psychiatric:        Mood and Affect: Affect normal.    Appointment on 06/30/2021  Component Date Value Ref Range Status   AFP, Serum, Tumor Marker 06/30/2021 1.2  0.0 - 9.2 ng/mL Final   Comment: (NOTE) Roche Diagnostics Electrochemiluminescence Immunoassay (ECLIA) Values obtained with different assay methods or kits cannot be used interchangeably.  Results cannot be interpreted as absolute evidence of the presence or absence of malignant disease. This test is not interpretable in pregnant females. Performed At: [facility] [place], [place] [id number] Rush Farmer MD GG:[id number]    Vitamin B-12 06/30/2021 679  180 - 914 pg/mL Final   Comment: (NOTE) This assay is not validated for testing neonatal or myeloproliferative syndrome specimens for Vitamin B12 levels. Performed at [facility], [place] [address], [place], [place] [address]    WBC 06/30/2021 2.2 (A) 4.0 - 10.5 K/uL Final   RBC 06/30/2021 4.23  3.87 - 5.11 MIL/uL Final   Hemoglobin 06/30/2021 12.6  12.0 - 15.0 g/dL Final   HCT 06/30/2021 36.1  36.0 - 46.0 % Final   MCV 06/30/2021 85.3  80.0 - 100.0 fL Final   MCH 06/30/2021 29.8  26.0 - 34.0 pg Final   MCHC 06/30/2021 34.9  30.0 - 36.0 g/dL Final   RDW 06/30/2021 13.0  11.5 - 15.5 % Final   Platelets 06/30/2021 38 (A) 150 - 400 K/uL Final   nRBC 06/30/2021 0.0   0.0 - 0.2 % Final   Neutrophils Relative % 06/30/2021 55  % Final   Lymphocytes Relative 06/30/2021 35  % Final   Monocytes Relative 06/30/2021 7  % Final   Eosinophils Relative 06/30/2021 3  % Final   Basophils Relative 06/30/2021 0  % Final   Band Neutrophils 06/30/2021 0  % Final   Metamyelocytes Relative 06/30/2021 0  % Final   Myelocytes 06/30/2021 0  % Final   Promyelocytes Relative 06/30/2021 0  % Final   Blasts 06/30/2021 0  % Final   nRBC 06/30/2021 0  0 /100 WBC Final   Other 06/30/2021 0  % Final   Neutro Abs 06/30/2021 1.1 (A) 1.7 - 7.7 K/uL Final   Lymphs Abs 06/30/2021 0.8  0.7 - 4.0 K/uL Final   Monocytes Absolute 06/30/2021 0.2  0.1 - 1.0 K/uL Final   Eosinophils Absolute 06/30/2021 0.1  0.0 - 0.5 K/uL Final   Basophils Absolute 06/30/2021 0.0  0.0 - 0.1 K/uL Final   Abs Immature Granulocytes 06/30/2021 0.00  0.00 - 0.07 K/uL Final   Smear Review 06/30/2021 SPECIMEN CHECKED FOR CLOTS   Final   Performed at [facility], [address] [place], [place] [address]    RADIOGRAPHIC STUDIES: I have personally reviewed the radiological images as listed and agreed with the findings in the report. US ABDOMEN LIMITED RUQ (LIVER/GB)  Result Date: 05/02/2021 CLINICAL DATA:  Cirrhosis EXAM: ULTRASOUND ABDOMEN LIMITED RIGHT UPPER QUADRANT COMPARISON:  CT abdomen pelvis 01/10/2020 FINDINGS: Gallbladder: No gallstones or wall thickening visualized. No sonographic Murphy sign noted by sonographer. Common bile duct: Diameter: 4 mm Liver: Nodularity of hepatic contours with coarsened parenchymal echogenicity is consistent with cirrhosis. No focal hepatic lesion. Portal vein is dilated measuring up to 1.8 cm in diameter. Recanalized paraumbilical veins again seen. Portal vein is patent on color Doppler imaging with normal direction of blood flow towards the liver. Other: None. IMPRESSION: Cirrhotic liver morphology without discrete lesion. Portal hypertension with recanalized  paraumbilical veins. Electronically Signed   By: Miachel Roux M.D.   On: 05/02/2021 13:26    Assessment:  Brandi Bright is a 50 y.o. female with history of liver cirrhosis, hepatitis C, splenomegaly, portal hypertension, chronic pancytopenia present for follow-up.  1. Iron deficiency anemia due to chronic blood loss   2. Thrombocytopenia (HCC)   3. Splenomegaly   4. Low vitamin B12 level   5. Cirrhosis of liver without ascites, unspecified hepatic cirrhosis type ([place])    #History of iron deficiency anemia, hemoglobin remained stable at 12.6.  # Chronic neutropenia and thrombocytopenia Bone marrow biopsy work-up was negative previously. chronic neutropenia and thrombocytopenia are likely due to chronic cirrhosis/splenomegaly. She is going to have EGD/colonoscopy next week. Discussed with gastroenterology Dr.Tahiliani, will transfuse 1 unit of platelet 1 day prior to the procedure.   #Vitamin B12 deficiency.  B12 level has improved.  Not on IM B12 injection due to concern of hematoma at injection sites.  Continue sublingual vitamin B-12 lozenges 2000 MCG daily.  #Cirrhosis/splenomegaly/history of varices I encourage patient to continue follow-up with gastroenterology for liver cirrhosis and HCC  AFP has been followed up.  06/30/2021 normal AFP at 1.2.  Follow up   Lab CBC CMP, AFP -NP in 6 months Lab CBC CMP AFP-MD in 12 months  We spent sufficient time to discuss many aspect of care, questions were answered to patient's satisfaction.   Earlie Server, MD, PhD Hematology Oncology [facility] at [facility] Pager- [phone] 07/01/2021

## 2021-07-04 ENCOUNTER — Other Ambulatory Visit: Payer: Self-pay | Admitting: Internal Medicine

## 2021-07-08 ENCOUNTER — Encounter: Payer: Self-pay | Admitting: Gastroenterology

## 2021-07-08 ENCOUNTER — Inpatient Hospital Stay: Payer: Medicaid Other

## 2021-07-08 DIAGNOSIS — D696 Thrombocytopenia, unspecified: Secondary | ICD-10-CM

## 2021-07-08 DIAGNOSIS — D500 Iron deficiency anemia secondary to blood loss (chronic): Secondary | ICD-10-CM | POA: Diagnosis not present

## 2021-07-08 LAB — CBC WITH DIFFERENTIAL/PLATELET
Abs Immature Granulocytes: 0.01 10*3/uL (ref 0.00–0.07)
Basophils Absolute: 0 10*3/uL (ref 0.0–0.1)
Basophils Relative: 1 %
Eosinophils Absolute: 0.1 10*3/uL (ref 0.0–0.5)
Eosinophils Relative: 3 %
HCT: 35.1 % — ABNORMAL LOW (ref 36.0–46.0)
Hemoglobin: 12.2 g/dL (ref 12.0–15.0)
Immature Granulocytes: 1 %
Lymphocytes Relative: 40 %
Lymphs Abs: 0.7 10*3/uL (ref 0.7–4.0)
MCH: 30.4 pg (ref 26.0–34.0)
MCHC: 34.8 g/dL (ref 30.0–36.0)
MCV: 87.5 fL (ref 80.0–100.0)
Monocytes Absolute: 0.2 10*3/uL (ref 0.1–1.0)
Monocytes Relative: 10 %
Neutro Abs: 0.8 10*3/uL — ABNORMAL LOW (ref 1.7–7.7)
Neutrophils Relative %: 45 %
Platelets: 36 10*3/uL — ABNORMAL LOW (ref 150–400)
RBC: 4.01 MIL/uL (ref 3.87–5.11)
RDW: 12.8 % (ref 11.5–15.5)
Smear Review: NORMAL
WBC: 1.8 10*3/uL — ABNORMAL LOW (ref 4.0–10.5)
nRBC: 0 % (ref 0.0–0.2)

## 2021-07-08 LAB — COMPREHENSIVE METABOLIC PANEL
ALT: 20 U/L (ref 0–44)
AST: 32 U/L (ref 15–41)
Albumin: 3.3 g/dL — ABNORMAL LOW (ref 3.5–5.0)
Alkaline Phosphatase: 85 U/L (ref 38–126)
Anion gap: 7 (ref 5–15)
BUN: 11 mg/dL (ref 6–20)
CO2: 26 mmol/L (ref 22–32)
Calcium: 9 mg/dL (ref 8.9–10.3)
Chloride: 109 mmol/L (ref 98–111)
Creatinine, Ser: 0.4 mg/dL — ABNORMAL LOW (ref 0.44–1.00)
GFR, Estimated: >60 mL/min (ref >60)
Glucose, Bld: 107 mg/dL — ABNORMAL HIGH (ref 70–99)
Potassium: 4 mmol/L (ref 3.5–5.1)
Sodium: 142 mmol/L (ref 135–145)
Total Bilirubin: 1.1 mg/dL (ref 0.3–1.2)
Total Protein: 6 g/dL — ABNORMAL LOW (ref 6.5–8.1)

## 2021-07-08 LAB — SAMPLE TO BLOOD BANK

## 2021-07-08 MED ORDER — DIPHENHYDRAMINE HCL 25 MG PO CAPS
25.0000 mg | ORAL_CAPSULE | Freq: Once | ORAL | Status: AC
  Administered 2021-07-08: 25 mg via ORAL
  Filled 2021-07-08: qty 1

## 2021-07-08 MED ORDER — SODIUM CHLORIDE 0.9% IV SOLUTION
250.0000 mL | Freq: Once | INTRAVENOUS | Status: AC
  Administered 2021-07-08: 250 mL via INTRAVENOUS
  Filled 2021-07-08: qty 250

## 2021-07-08 MED ORDER — ACETAMINOPHEN 325 MG PO TABS
650.0000 mg | ORAL_TABLET | Freq: Once | ORAL | Status: AC
  Administered 2021-07-08: 650 mg via ORAL
  Filled 2021-07-08: qty 2

## 2021-07-08 NOTE — Patient Instructions (Signed)
[facility] ONCOLOGY  Discharge Instructions: Thank you for choosing [facility] to provide your oncology and hematology care.  If you have a lab appointment with the [facility], please go directly to the [facility] and check in at the registration area.  Wear comfortable clothing and clothing appropriate for easy access to any Portacath or PICC line.   We strive to give you quality time with your provider. You may need to reschedule your appointment if you arrive late (15 or more minutes).  Arriving late affects you and other patients whose appointments are after yours.  Also, if you miss three or more appointments without notifying the office, you may be dismissed from the clinic at the provider's discretion.      For prescription refill requests, have your pharmacy contact our office and allow 72 hours for refills to be completed.    Today you received the following : Platelet transfusion   To help prevent nausea and vomiting after your treatment, we encourage you to take your nausea medication as directed.  BELOW ARE SYMPTOMS THAT SHOULD BE REPORTED IMMEDIATELY: *FEVER GREATER THAN 100.4 F (38 C) OR HIGHER *CHILLS OR SWEATING *NAUSEA AND VOMITING THAT IS NOT CONTROLLED WITH YOUR NAUSEA MEDICATION *UNUSUAL SHORTNESS OF BREATH *UNUSUAL BRUISING OR BLEEDING *URINARY PROBLEMS (pain or burning when urinating, or frequent urination) *BOWEL PROBLEMS (unusual diarrhea, constipation, pain near the anus) TENDERNESS IN MOUTH AND THROAT WITH OR WITHOUT PRESENCE OF ULCERS (sore throat, sores in mouth, or a toothache) UNUSUAL RASH, SWELLING OR PAIN  UNUSUAL VAGINAL DISCHARGE OR ITCHING   Items with * indicate a potential emergency and should be followed up as soon as possible or go to the Emergency Department if any problems should occur.  Please show the CHEMOTHERAPY ALERT CARD or IMMUNOTHERAPY ALERT CARD at check-in to the Emergency Department and  triage nurse.  Should you have questions after your visit or need to cancel or reschedule your appointment, please contact [facility]  [phone] and follow the prompts.  Office hours are 8:00 a.m. to 4:30 p.m. Monday - Friday. Please note that voicemails left after 4:00 p.m. may not be returned until the following business day.  We are closed weekends and major holidays. You have access to a nurse at all times for urgent questions. Please call the main number to the clinic [phone] and follow the prompts.  For any non-urgent questions, you may also contact your provider using MyChart. We now offer e-Visits for anyone 41 and older to request care online for non-urgent symptoms. For details visit [email or web address].   Also download the MyChart app! Go to the app store, search "MyChart", open the app, select [facility], and log in with your MyChart username and password.  Due to Covid, a mask is required upon entering the hospital/clinic. If you do not have a mask, one will be given to you upon arrival. For doctor visits, patients may have 1 support person aged 21 or older with them. For treatment visits, patients cannot have anyone with them due to current Covid guidelines and our immunocompromised population.

## 2021-07-09 ENCOUNTER — Encounter
Admission: RE | Disposition: A | Discharge status: Home / Self Care | Payer: Self-pay | Source: Home / Self Care | Attending: Gastroenterology

## 2021-07-09 ENCOUNTER — Ambulatory Visit: Payer: Medicaid Other | Admitting: Anesthesiology

## 2021-07-09 ENCOUNTER — Telehealth: Payer: Self-pay

## 2021-07-09 ENCOUNTER — Ambulatory Visit: Payer: Medicaid Other | Admitting: Gastroenterology

## 2021-07-09 ENCOUNTER — Other Ambulatory Visit: Payer: Self-pay

## 2021-07-09 ENCOUNTER — Ambulatory Visit
Admission: RE | Admit: 2021-07-09 | Discharge: 2021-07-09 | Disposition: A | Discharge status: Home / Self Care | Payer: Medicaid Other | Attending: Gastroenterology | Admitting: Gastroenterology

## 2021-07-09 ENCOUNTER — Encounter: Payer: Self-pay | Admitting: Gastroenterology

## 2021-07-09 DIAGNOSIS — Z860100 Personal history of colon polyps, unspecified: Secondary | ICD-10-CM

## 2021-07-09 DIAGNOSIS — K644 Residual hemorrhoidal skin tags: Secondary | ICD-10-CM | POA: Diagnosis not present

## 2021-07-09 DIAGNOSIS — K635 Polyp of colon: Secondary | ICD-10-CM

## 2021-07-09 DIAGNOSIS — D123 Benign neoplasm of transverse colon: Secondary | ICD-10-CM | POA: Insufficient documentation

## 2021-07-09 DIAGNOSIS — Z885 Allergy status to narcotic agent status: Secondary | ICD-10-CM | POA: Diagnosis not present

## 2021-07-09 DIAGNOSIS — K649 Unspecified hemorrhoids: Secondary | ICD-10-CM | POA: Insufficient documentation

## 2021-07-09 DIAGNOSIS — K621 Rectal polyp: Secondary | ICD-10-CM | POA: Diagnosis not present

## 2021-07-09 DIAGNOSIS — Z79890 Hormone replacement therapy (postmenopausal): Secondary | ICD-10-CM | POA: Insufficient documentation

## 2021-07-09 DIAGNOSIS — Z79899 Other long term (current) drug therapy: Secondary | ICD-10-CM | POA: Diagnosis not present

## 2021-07-09 DIAGNOSIS — Z880 Allergy status to penicillin: Secondary | ICD-10-CM | POA: Insufficient documentation

## 2021-07-09 DIAGNOSIS — I8500 Esophageal varices without bleeding: Secondary | ICD-10-CM | POA: Insufficient documentation

## 2021-07-09 DIAGNOSIS — D120 Benign neoplasm of cecum: Secondary | ICD-10-CM | POA: Diagnosis not present

## 2021-07-09 DIAGNOSIS — Z888 Allergy status to other drugs, medicaments and biological substances status: Secondary | ICD-10-CM | POA: Diagnosis not present

## 2021-07-09 DIAGNOSIS — R1310 Dysphagia, unspecified: Secondary | ICD-10-CM | POA: Diagnosis not present

## 2021-07-09 DIAGNOSIS — K317 Polyp of stomach and duodenum: Secondary | ICD-10-CM | POA: Diagnosis not present

## 2021-07-09 DIAGNOSIS — Z1211 Encounter for screening for malignant neoplasm of colon: Secondary | ICD-10-CM | POA: Insufficient documentation

## 2021-07-09 HISTORY — PX: ESOPHAGOGASTRODUODENOSCOPY (EGD) WITH PROPOFOL: SHX5813

## 2021-07-09 HISTORY — PX: COLONOSCOPY WITH PROPOFOL: SHX5780

## 2021-07-09 LAB — BPAM PLATELET PHERESIS
Blood Product Expiration Date: 202207282359
ISSUE DATE / TIME: 202207261348
Unit Type and Rh: 5100

## 2021-07-09 LAB — PREPARE PLATELET PHERESIS: Unit division: 0

## 2021-07-09 LAB — AFP TUMOR MARKER: AFP, Serum, Tumor Marker: 1.4 ng/mL (ref 0.0–9.2)

## 2021-07-09 LAB — POCT PREGNANCY, URINE: Preg Test, Ur: NEGATIVE

## 2021-07-09 SURGERY — COLONOSCOPY WITH PROPOFOL
Anesthesia: General

## 2021-07-09 MED ORDER — LIDOCAINE 2% (20 MG/ML) 5 ML SYRINGE
INTRAMUSCULAR | Status: DC | PRN
  Administered 2021-07-09: 25 mg via INTRAVENOUS

## 2021-07-09 MED ORDER — GLYCOPYRROLATE 0.2 MG/ML IJ SOLN
INTRAMUSCULAR | Status: AC
  Filled 2021-07-09: qty 1

## 2021-07-09 MED ORDER — GLYCOPYRROLATE 0.2 MG/ML IJ SOLN
INTRAMUSCULAR | Status: DC | PRN
Start: 2021-07-09 — End: 2021-07-09
  Administered 2021-07-09: .2 mg via INTRAVENOUS

## 2021-07-09 MED ORDER — PROPOFOL 500 MG/50ML IV EMUL
INTRAVENOUS | Status: DC | PRN
  Administered 2021-07-09: 120 ug/kg/min via INTRAVENOUS

## 2021-07-09 MED ORDER — MIDAZOLAM HCL 2 MG/2ML IJ SOLN
INTRAMUSCULAR | Status: AC
  Filled 2021-07-09: qty 2

## 2021-07-09 MED ORDER — FENTANYL CITRATE (PF) 100 MCG/2ML IJ SOLN
INTRAMUSCULAR | Status: AC
  Filled 2021-07-09: qty 2

## 2021-07-09 MED ORDER — PROPOFOL 500 MG/50ML IV EMUL
INTRAVENOUS | Status: AC
  Filled 2021-07-09: qty 50

## 2021-07-09 MED ORDER — PROPOFOL 10 MG/ML IV BOLUS
INTRAVENOUS | Status: DC | PRN
  Administered 2021-07-09: 50 mg via INTRAVENOUS
  Administered 2021-07-09: 70 mg via INTRAVENOUS
  Administered 2021-07-09: 50 mg via INTRAVENOUS
  Administered 2021-07-09: 30 mg via INTRAVENOUS

## 2021-07-09 MED ORDER — SODIUM CHLORIDE 0.9 % IV SOLN: INTRAVENOUS | Status: DC

## 2021-07-09 MED ORDER — FENTANYL CITRATE (PF) 100 MCG/2ML IJ SOLN
INTRAMUSCULAR | Status: DC | PRN
  Administered 2021-07-09 (×2): 50 ug via INTRAVENOUS

## 2021-07-09 MED ORDER — MIDAZOLAM HCL 5 MG/5ML IJ SOLN
INTRAMUSCULAR | Status: DC | PRN
  Administered 2021-07-09: 2 mg via INTRAVENOUS

## 2021-07-09 NOTE — Telephone Encounter (Signed)
Patient was suppose to have a 3 month follow up appointment today but had a colonoscopy and EGD done this morning with Dr. Bonna Gains. Patient wants to know when the provider wants her to follow up with Dr. Bonna Gains

## 2021-07-09 NOTE — H&P (Signed)
[name], MD [address], [place], [place], [place], [address] [address], [place], [place], [place], [address] Phone: [phone]  Fax: [phone]  Primary Care Physician:  Cletis Athens, MD   Pre-Procedure History & Physical: HPI:  Brandi Bright is a 50 y.o. female is here for a colonoscopy and EGD.   Past Medical History:  Diagnosis Date   Arthritis    Back pain    Gout    Hypothyroidism    IDA (iron deficiency anemia) 01/16/2020   MI, old    Thyroid disease     Past Surgical History:  Procedure Laterality Date   CARDIAC CATHETERIZATION     CESAREAN SECTION     COLONOSCOPY WITH PROPOFOL N/A 03/16/2018   Procedure: COLONOSCOPY WITH PROPOFOL;  Surgeon: Virgel Manifold, MD;  Location: [facility] ENDOSCOPY;  Service: Endoscopy;  Laterality: N/A;   ESOPHAGOGASTRODUODENOSCOPY N/A 10/11/2019   Procedure: ESOPHAGOGASTRODUODENOSCOPY (EGD);  Surgeon: Lin Landsman, MD;  Location: [facility] ENDOSCOPY;  Service: Gastroenterology;  Laterality: N/A;   ESOPHAGOGASTRODUODENOSCOPY (EGD) WITH PROPOFOL N/A 10/27/2018   Procedure: ESOPHAGOGASTRODUODENOSCOPY (EGD) WITH PROPOFOL;  Surgeon: Virgel Manifold, MD;  Location: [facility] ENDOSCOPY;  Service: Endoscopy;  Laterality: N/A;   ESOPHAGOGASTRODUODENOSCOPY (EGD) WITH PROPOFOL N/A 06/25/2019   Procedure: ESOPHAGOGASTRODUODENOSCOPY (EGD) WITH PROPOFOL;  Surgeon: Jonathon Bellows, MD;  Location: [facility] ENDOSCOPY;  Service: Gastroenterology;  Laterality: N/A;   ESOPHAGOGASTRODUODENOSCOPY (EGD) WITH PROPOFOL N/A 09/12/2019   Procedure: ESOPHAGOGASTRODUODENOSCOPY (EGD) WITH PROPOFOL;  Surgeon: Lucilla Lame, MD;  Location: [facility] ENDOSCOPY;  Service: Endoscopy;  Laterality: N/A;   ESOPHAGOGASTRODUODENOSCOPY (EGD) WITH PROPOFOL N/A 11/20/2019   Procedure: ESOPHAGOGASTRODUODENOSCOPY (EGD) WITH PROPOFOL;  Surgeon: Virgel Manifold, MD;  Location: [facility] ENDOSCOPY;  Service: Endoscopy;  Laterality: N/A;   ESOPHAGOGASTRODUODENOSCOPY (EGD) WITH  PROPOFOL N/A 11/23/2019   Procedure: ESOPHAGOGASTRODUODENOSCOPY (EGD) WITH PROPOFOL;  Surgeon: Virgel Manifold, MD;  Location: [facility] ENDOSCOPY;  Service: Endoscopy;  Laterality: N/A;   ESOPHAGOGASTRODUODENOSCOPY (EGD) WITH PROPOFOL N/A 01/03/2020   Procedure: ESOPHAGOGASTRODUODENOSCOPY (EGD) WITH PROPOFOL;  Surgeon: Lin Landsman, MD;  Location: [facility];  Service: Endoscopy;  Laterality: N/A;   TUBAL LIGATION      Prior to Admission medications   Medication Sig Start Date End Date Taking? Authorizing Provider  hydrOXYzine (ATARAX/VISTARIL) 25 MG tablet Take 25 mg by mouth every 8 (eight) hours as needed. 03/19/21  Yes [provider]  levothyroxine (SYNTHROID) 150 MCG tablet TAKE 1 TABLET BY MOUTH DAILY, 30 MINUTES BEFORE A MEAL 07/04/21  Yes Masoud, Viann Shove, MD  Biotin 10 MG CAPS Take 1 capsule by mouth daily. Patient not taking: Reported on 07/01/2021    [provider]  cyanocobalamin ([employer] VITAMIN B12) 1000 MCG tablet Take 1 tablet (1,000 mcg total) by mouth daily. 10/22/20   Earlie Server, MD  cyclobenzaprine (FLEXERIL) 10 MG tablet Take 10 mg by mouth daily as needed. Patient not taking: Reported on 07/01/2021 10/09/20   [provider]  ferrous sulfate 325 (65 FE) MG EC tablet TAKE 1 TABLET BY MOUTH EVERY DAY WITH BREAKFAST 04/25/21   Earlie Server, MD  lactulose (CHRONULAC) 10 GM/15ML solution TAKE 15 MLS (10 G TOTAL) BY MOUTH DAILY FOR 30 DAYS. 04/09/21   Virgel Manifold, MD  nitroGLYCERIN (NITROLINGUAL) 0.4 MG/SPRAY spray Place 1 spray under the tongue every 5 (five) minutes x 3 doses as needed for chest pain. Patient not taking: No sig reported 04/24/20   Cletis Athens, MD  Oxycodone HCl 10 MG TABS Take 10 mg by mouth 4 (four)  times daily as needed. 10/14/20   [provider]  XTAMPZA ER 9 MG C12A Take 1 capsule by mouth every 12 (twelve) hours. 06/18/21   [provider]    Allergies as of 06/13/2021 - Review Complete 04/11/2021  Allergen  Reaction Noted   Vicodin [hydrocodone-acetaminophen] Rash 01/11/2016   Amoxicillin Rash and Other (See Comments) 01/11/2016   Gabapentin Rash 01/11/2016    Family History  Problem Relation Age of Onset   Hypertension Other    CAD Father    Colon cancer Father    Lung cancer Father    Pancreatic cancer Brother     Social History   Socioeconomic History   Marital status: Single    Spouse name: Not on file   Number of children: Not on file   Years of education: Not on file   Highest education level: Not on file  Occupational History   Not on file  Tobacco Use   Smoking status: Never   Smokeless tobacco: Never  Vaping Use   Vaping Use: Never used  Substance and Sexual Activity   Alcohol use: Yes    Comment: rarely   Drug use: Never   Sexual activity: Not Currently  Other Topics Concern   Not on file  Social History Narrative   Not on file   Social Determinants of Health   Financial Resource Strain: Not on file  Food Insecurity: Not on file  Transportation Needs: Not on file  Physical Activity: Not on file  Stress: Not on file  Social Connections: Not on file  Intimate Partner Violence: Not on file    Review of Systems: See HPI, otherwise negative ROS  Constitutional: General:   Alert,  Well-developed, well-nourished, pleasant and cooperative in NAD BP (!) 155/108   Pulse (!) 105   Temp 97.8 F (36.6 C) (Temporal)   Resp 18   Ht '4\' 9"'$  (1.448 m)   Wt 90.7 kg   LMP  (LMP Unknown) Comment: last menstrual 9 years ago, tubal ligation  SpO2 98%   BMI 43.28 kg/m   Head: Normocephalic, atraumatic.   Eyes:  Sclera clear, no icterus.   Conjunctiva pink.   Mouth:  No deformity or lesions, oropharynx pink & moist.  Neck:  Supple, trachea midline  Respiratory: Normal respiratory effort  Gastrointestinal:  Soft, non-tender and non-distended without masses, hepatosplenomegaly or hernias noted.  No guarding or rebound tenderness.     Cardiac: No clubbing or  edema.  No cyanosis. Normal posterior tibial pedal pulses noted.  Lymphatic:  No significant cervical adenopathy.  Psych:  Alert and cooperative. Normal mood and affect.  Musculoskeletal:   Symmetrical without gross deformities. 5/5 Lower extremity strength bilaterally.  Skin: Warm. Intact without significant lesions or rashes. No jaundice.  Neurologic:  Face symmetrical, tongue midline, Normal sensation to touch;  grossly normal neurologically.  Psych:  Alert and oriented x3, Alert and cooperative. Normal mood and affect.  Impression/Plan: [name] is here for a colonoscopy to be performed for history of polyps and EGD for dysphagia  Risks, benefits, limitations, and alternatives regarding the procedures have been reviewed with the patient.  Questions have been answered.  All parties agreeable.   Virgel Manifold, MD  07/09/2021, 8:13 AM

## 2021-07-09 NOTE — Anesthesia Preprocedure Evaluation (Signed)
Anesthesia Evaluation  Patient identified by MRN, date of birth, ID band Patient awake    Reviewed: Allergy & Precautions, NPO status , Patient's Chart, lab work & pertinent test results  Airway Mallampati: II  TM Distance: >3 FB Neck ROM: full    Dental no notable dental hx.    Pulmonary neg pulmonary ROS,    Pulmonary exam normal        Cardiovascular hypertension, + angina + CAD and + Past MI  Normal cardiovascular exam     Neuro/Psych PSYCHIATRIC DISORDERS Anxiety Depression negative neurological ROS     GI/Hepatic negative GI ROS, Neg liver ROS,   Endo/Other  Hypothyroidism   Renal/GU negative Renal ROS  negative genitourinary   Musculoskeletal   Abdominal (+) + obese,   Peds  Hematology  (+) Blood dyscrasia, anemia ,   Anesthesia Other Findings Past Medical History: No date: Arthritis No date: Back pain No date: Gout No date: Hypothyroidism 01/16/2020: IDA (iron deficiency anemia) No date: MI, old No date: Thyroid disease  Past Surgical History: No date: CARDIAC CATHETERIZATION No date: CESAREAN SECTION 03/16/2018: COLONOSCOPY WITH PROPOFOL; N/A     Comment:  Procedure: COLONOSCOPY WITH PROPOFOL;  Surgeon:               Virgel Manifold, MD;  Location: [facility] ENDOSCOPY;                Service: Endoscopy;  Laterality: N/A; 10/11/2019: ESOPHAGOGASTRODUODENOSCOPY; N/A     Comment:  Procedure: ESOPHAGOGASTRODUODENOSCOPY (EGD);  Surgeon:               Lin Landsman, MD;  Location: [facility] ENDOSCOPY;                Service: Gastroenterology;  Laterality: N/A; 10/27/2018: ESOPHAGOGASTRODUODENOSCOPY (EGD) WITH PROPOFOL; N/A     Comment:  Procedure: ESOPHAGOGASTRODUODENOSCOPY (EGD) WITH               PROPOFOL;  Surgeon: Virgel Manifold, MD;  Location:               [facility] ENDOSCOPY;  Service: Endoscopy;  Laterality: N/A; 06/25/2019: ESOPHAGOGASTRODUODENOSCOPY (EGD) WITH PROPOFOL; N/A     Comment:   Procedure: ESOPHAGOGASTRODUODENOSCOPY (EGD) WITH               PROPOFOL;  Surgeon: Jonathon Bellows, MD;  Location: [facility]               ENDOSCOPY;  Service: Gastroenterology;  Laterality: N/A; 09/12/2019: ESOPHAGOGASTRODUODENOSCOPY (EGD) WITH PROPOFOL; N/A     Comment:  Procedure: ESOPHAGOGASTRODUODENOSCOPY (EGD) WITH               PROPOFOL;  Surgeon: Lucilla Lame, MD;  Location: [facility]               ENDOSCOPY;  Service: Endoscopy;  Laterality: N/A; 11/20/2019: ESOPHAGOGASTRODUODENOSCOPY (EGD) WITH PROPOFOL; N/A     Comment:  Procedure: ESOPHAGOGASTRODUODENOSCOPY (EGD) WITH               PROPOFOL;  Surgeon: Virgel Manifold, MD;  Location:               [facility] ENDOSCOPY;  Service: Endoscopy;  Laterality: N/A; 11/23/2019: ESOPHAGOGASTRODUODENOSCOPY (EGD) WITH PROPOFOL; N/A     Comment:  Procedure: ESOPHAGOGASTRODUODENOSCOPY (EGD) WITH               PROPOFOL;  Surgeon: Virgel Manifold, MD;  Location:  [facility] ENDOSCOPY;  Service: Endoscopy;  Laterality: N/A; 01/03/2020: ESOPHAGOGASTRODUODENOSCOPY (EGD) WITH PROPOFOL; N/A     Comment:  Procedure: ESOPHAGOGASTRODUODENOSCOPY (EGD) WITH               PROPOFOL;  Surgeon: Lin Landsman, MD;  Location:               [facility] ENDOSCOPY;  Service: Endoscopy;  Laterality: N/A; No date: TUBAL LIGATION  BMI    Body Mass Index: 43.28 kg/m      Reproductive/Obstetrics negative OB ROS                             Anesthesia Physical Anesthesia Plan  ASA: 3  Anesthesia Plan: General   Post-op Pain Management:    Induction: Intravenous  PONV Risk Score and Plan: Propofol infusion and TIVA  Airway Management Planned: Natural Airway and Nasal Cannula  Additional Equipment:   Intra-op Plan:   Post-operative Plan:   Informed Consent: I have reviewed the patients History and Physical, chart, labs and discussed the procedure including the risks, benefits and alternatives for the proposed anesthesia with the  patient or authorized representative who has indicated his/her understanding and acceptance.     Dental Advisory Given  Plan Discussed with: Anesthesiologist, CRNA and Surgeon  Anesthesia Plan Comments: (Patient consented for risks of anesthesia including but not limited to:  - adverse reactions to medications - risk of airway placement if required - damage to eyes, teeth, lips or other oral mucosa - nerve damage due to positioning  - sore throat or hoarseness - Damage to heart, brain, nerves, lungs, other parts of body or loss of life  Patient voiced understanding.)        Anesthesia Quick Evaluation

## 2021-07-09 NOTE — Transfer of Care (Signed)
Immediate Anesthesia Transfer of Care Note  Patient: Brandi Bright  Procedure(s) Performed: COLONOSCOPY WITH PROPOFOL ESOPHAGOGASTRODUODENOSCOPY (EGD) WITH PROPOFOL  Patient Location: Endoscopy Unit  Anesthesia Type:General  Level of Consciousness: awake and alert   Airway & Oxygen Therapy: Patient Spontanous Breathing  Post-op Assessment: Report given to RN and Post -op Vital signs reviewed and stable  Post vital signs: Reviewed  Last Vitals:  Vitals Value Taken Time  BP 115/75 07/09/21 1020  Temp 36.6 C 07/09/21 1019  Pulse 90 07/09/21 1020  Resp 20 07/09/21 1020  SpO2 96 % 07/09/21 1020  Vitals shown include unvalidated device data.  Last Pain:  Vitals:   07/09/21 1019  TempSrc: Temporal  PainSc: Asleep         Complications: No notable events documented.

## 2021-07-09 NOTE — Op Note (Addendum)
[facility] Gastroenterology Patient Name: Brandi Bright Procedure Date: 07/09/2021 8:16 AM MRN: [id number] Account #: [id number] Date of Birth: 1971/05/12 Admit Type: Outpatient Age: 50 Room: [facility] ENDO ROOM 3 Gender: Female Note Status: Finalized Procedure:             Colonoscopy Indications:           High risk colon cancer surveillance: Personal history                         of colonic polyps Providers:             Chenell Lozon B. Maximino Greenland MD, MD Medicines:             Monitored Anesthesia Care Complications:         No immediate complications. Procedure:             Pre-Anesthesia Assessment:                        - ASA Grade Assessment: II - A patient with mild                         systemic disease.                        - Prior to the procedure, a History and Physical was                         performed, and patient medications, allergies and                         sensitivities were reviewed. The patient's tolerance                         of previous anesthesia was reviewed.                        - The risks and benefits of the procedure and the                         sedation options and risks were discussed with the                         patient. All questions were answered and informed                         consent was obtained.                        - Patient identification and proposed procedure were                         verified prior to the procedure by the physician, the                         nurse, the anesthesiologist, the anesthetist and the                         technician. The procedure was verified in the  procedure room.                        After obtaining informed consent, the colonoscope was                         passed under direct vision. Throughout the procedure,                         the patient's blood pressure, pulse, and oxygen                         saturations were monitored  continuously. The                         Colonoscope was introduced through the anus and                         advanced to the the cecum, identified by appendiceal                         orifice and ileocecal valve. The colonoscopy was                         performed with ease. The patient tolerated the                         procedure well. The quality of the bowel preparation                         was good. Findings:      Hemorrhoids were found on perianal exam.      A 3 mm polyp was found in the cecum. The polyp was sessile. The polyp       was removed with a cold biopsy forceps. Resection and retrieval were       complete.      A 5 mm polyp was found in the transverse colon. The polyp was sessile.       The polyp was removed with a cold snare. Resection and retrieval were       complete.      Two sessile polyps were found in the rectum and sigmoid colon. The       polyps were 3 to 4 mm in size. These polyps were removed with a cold       biopsy forceps. Resection and retrieval were complete.      The exam was otherwise without abnormality.      The rectum, sigmoid colon, descending colon, transverse colon, ascending       colon and cecum appeared normal.      The retroflexed view of the distal rectum and anal verge was normal and       showed no anal or rectal abnormalities. Impression:            - Hemorrhoids found on perianal exam.                        - One 3 mm polyp in the cecum, removed with a cold  biopsy forceps. Resected and retrieved.                        - One 5 mm polyp in the transverse colon, removed with                         a cold snare. Resected and retrieved.                        - Two 3 to 4 mm polyps in the rectum and in the                         sigmoid colon, removed with a cold biopsy forceps.                         Resected and retrieved.                        - The examination was otherwise normal.                         - The rectum, sigmoid colon, descending colon,                         transverse colon, ascending colon and cecum are normal.                        - The distal rectum and anal verge are normal on                         retroflexion view. Recommendation:        - Await pathology results.                        - Referral to General surgery due to large external                         hemorrhoids                        - Discharge patient to home (with escort).                        - Advance diet as tolerated.                        - Continue present medications.                        - Repeat colonoscopy date to be determined after                         pending pathology results are reviewed.                        - The findings and recommendations were discussed with                         the patient.                        -  The findings and recommendations were discussed with                         the patient's family.                        - Return to primary care physician as previously                         scheduled. Procedure Code(s):     --- Professional ---                        [phone], Colonoscopy, flexible; with removal of                         tumor(s), polyp(s), or other lesion(s) by snare                         technique                        [id number], 59, Colonoscopy, flexible; with biopsy, single                         or multiple Diagnosis Code(s):     --- Professional ---                        Z86.010, Personal history of colonic polyps                        K63.5, Polyp of colon                        K62.1, Rectal polyp CPT copyright 2019 American Medical Association. All rights reserved. The codes documented in this report are preliminary and upon coder review may  be revised to meet current compliance requirements.  [name], MD [name] MD, MD 07/09/2021 10:30:04 AM This report has been signed  electronically. Number of Addenda: 0 Note Initiated On: 07/09/2021 8:16 AM Scope Withdrawal Time: 0 hours 19 minutes 37 seconds  Total Procedure Duration: 0 hours 24 minutes 16 seconds  Estimated Blood Loss:  Estimated blood loss: none.      [facility]

## 2021-07-09 NOTE — Anesthesia Postprocedure Evaluation (Signed)
Anesthesia Post Note  Patient: Brandi Bright  Procedure(s) Performed: COLONOSCOPY WITH PROPOFOL ESOPHAGOGASTRODUODENOSCOPY (EGD) WITH PROPOFOL  Patient location during evaluation: Endoscopy Anesthesia Type: General Level of consciousness: awake and alert Pain management: pain level controlled Vital Signs Assessment: post-procedure vital signs reviewed and stable Respiratory status: spontaneous breathing, nonlabored ventilation, respiratory function stable and patient connected to nasal cannula oxygen Cardiovascular status: blood pressure returned to baseline and stable Postop Assessment: no apparent nausea or vomiting Anesthetic complications: no   No notable events documented.   Last Vitals:  Vitals:   07/09/21 1019 07/09/21 1029  BP: 115/75   Pulse:    Resp:  20  Temp: 36.6 C   SpO2:      Last Pain:  Vitals:   07/09/21 1039  TempSrc:   PainSc: 0-No pain                 Margaree Mackintosh

## 2021-07-09 NOTE — Op Note (Signed)
[facility] Gastroenterology Patient Name: Brandi Bright Procedure Date: 07/09/2021 8:16 AM MRN: [id number] Account #: [id number] Date of Birth: 12/06/1971 Admit Type: Outpatient Age: 50 Room: [facility] ENDO ROOM 3 Gender: Female Note Status: Finalized Procedure:             Upper GI endoscopy Indications:           Dysphagia Providers:             Daymion Nazaire B. Maximino Greenland MD, MD Medicines:             Monitored Anesthesia Care Complications:         No immediate complications. Procedure:             Pre-Anesthesia Assessment:                        - The risks and benefits of the procedure and the                         sedation options and risks were discussed with the                         patient. All questions were answered and informed                         consent was obtained.                        - Patient identification and proposed procedure were                         verified prior to the procedure.                        - ASA Grade Assessment: III - A patient with severe                         systemic disease.                        After obtaining informed consent, the endoscope was                         passed under direct vision. Throughout the procedure,                         the patient's blood pressure, pulse, and oxygen                         saturations were monitored continuously. The Endoscope                         was introduced through the mouth, and advanced to the                         second part of duodenum. The upper GI endoscopy was                         accomplished with ease. The patient tolerated the  procedure well. Findings:      Grade I varices with no bleeding and no stigmata of recent bleeding were       found in the distal esophagus,. No red wale signs were present. Scarring       from prior treatment was visible.      The exam of the esophagus was otherwise normal.      A single 15 mm  sessile polyp with no bleeding and no stigmata of recent       bleeding was found in the gastric antrum. Biopsies were taken with a       cold forceps for histology.      Multiple 4 to 5 mm sessile polyps with no bleeding and no stigmata of       recent bleeding were found in the gastric body. Biopsies were taken with       a cold forceps for histology.      The exam of the stomach was otherwise normal.      The examined duodenum was normal. Impression:            - Grade I esophageal varices with no bleeding and no                         stigmata of recent bleeding.                        - A single gastric polyp. Biopsied.                        - Multiple gastric polyps. Biopsied.                        - Normal examined duodenum. Recommendation:        - Await pathology results.                        - Continue present medications.                        - Return to my office as previously scheduled.                        - Return to primary care physician as previously                         scheduled.                        - The findings and recommendations were discussed with                         the patient.                        - The findings and recommendations were discussed with                         the patient's family. Procedure Code(s):     --- Professional ---                        [phone], Esophagogastroduodenoscopy, flexible,  transoral; with biopsy, single or multiple Diagnosis Code(s):     --- Professional ---                        I85.00, Esophageal varices without bleeding                        K31.7, Polyp of stomach and duodenum                        R13.10, Dysphagia, unspecified CPT copyright 2019 American Medical Association. All rights reserved. The codes documented in this report are preliminary and upon coder review may  be revised to meet current compliance requirements.  [name], MD [name]  MD, MD 07/09/2021 9:39:54 AM This report has been signed electronically. Number of Addenda: 0 Note Initiated On: 07/09/2021 8:16 AM Estimated Blood Loss:  Estimated blood loss: none.      [facility]

## 2021-07-10 ENCOUNTER — Encounter: Payer: Self-pay | Admitting: Gastroenterology

## 2021-07-11 LAB — SURGICAL PATHOLOGY

## 2021-07-15 ENCOUNTER — Telehealth: Payer: Self-pay

## 2021-07-15 ENCOUNTER — Other Ambulatory Visit: Payer: Self-pay | Admitting: Gastroenterology

## 2021-07-15 DIAGNOSIS — K317 Polyp of stomach and duodenum: Secondary | ICD-10-CM

## 2021-07-15 NOTE — Telephone Encounter (Signed)
Referral form, demographics, insurance card and colonoscopy report faxed

## 2021-07-17 DIAGNOSIS — M25561 Pain in right knee: Secondary | ICD-10-CM | POA: Diagnosis not present

## 2021-07-17 DIAGNOSIS — M25562 Pain in left knee: Secondary | ICD-10-CM | POA: Diagnosis not present

## 2021-07-22 ENCOUNTER — Ambulatory Visit (INDEPENDENT_AMBULATORY_CARE_PROVIDER_SITE_OTHER): Payer: Medicaid Other | Admitting: Internal Medicine

## 2021-07-22 ENCOUNTER — Encounter: Payer: Self-pay | Admitting: Internal Medicine

## 2021-07-22 ENCOUNTER — Other Ambulatory Visit: Payer: Self-pay

## 2021-07-22 VITALS — BP 145/85 | HR 76 | Ht <= 58 in | Wt 199.4 lb

## 2021-07-22 DIAGNOSIS — H60313 Diffuse otitis externa, bilateral: Secondary | ICD-10-CM

## 2021-07-22 DIAGNOSIS — E782 Mixed hyperlipidemia: Secondary | ICD-10-CM

## 2021-07-22 DIAGNOSIS — I10 Essential (primary) hypertension: Secondary | ICD-10-CM

## 2021-07-22 DIAGNOSIS — I208 Other forms of angina pectoris: Secondary | ICD-10-CM | POA: Diagnosis not present

## 2021-07-22 DIAGNOSIS — M47812 Spondylosis without myelopathy or radiculopathy, cervical region: Secondary | ICD-10-CM | POA: Diagnosis not present

## 2021-07-22 DIAGNOSIS — R1312 Dysphagia, oropharyngeal phase: Secondary | ICD-10-CM | POA: Diagnosis not present

## 2021-07-22 DIAGNOSIS — E669 Obesity, unspecified: Secondary | ICD-10-CM

## 2021-07-22 DIAGNOSIS — D696 Thrombocytopenia, unspecified: Secondary | ICD-10-CM

## 2021-07-22 DIAGNOSIS — I2089 Other forms of angina pectoris: Secondary | ICD-10-CM

## 2021-07-22 MED ORDER — CIPROFLOXACIN-DEXAMETHASONE 0.3-0.1 % OT SUSP: 4.0000 [drp] | Freq: Two times a day (BID) | OTIC | 0 refills | Status: DC

## 2021-07-22 MED ORDER — ROSUVASTATIN CALCIUM 10 MG PO TABS: 10.0000 mg | ORAL_TABLET | Freq: Every day | ORAL | 3 refills | Status: DC

## 2021-07-22 NOTE — Assessment & Plan Note (Signed)
Left ear is inflamed with otitis external I started the patient on Ciprodex she will come back to see me in 10 days

## 2021-07-22 NOTE — Assessment & Plan Note (Signed)
Anxiety stable I started the patient on cholesterol medicine

## 2021-07-22 NOTE — Assessment & Plan Note (Signed)
Stable

## 2021-07-22 NOTE — Assessment & Plan Note (Signed)

## 2021-07-22 NOTE — Progress Notes (Signed)
Established Patient Office Visit  Subjective:  Patient ID: Brandi Bright, female    DOB: 1971/07/19  Age: 50 y.o. MRN: [id number]  CC:  Chief Complaint  Patient presents with   Ear Pain    Patient having left ear pain x 1 week    HPI  Brandi Bright presents for lt ear pain  Past Medical History:  Diagnosis Date   Arthritis    Back pain    Gout    Hypothyroidism    IDA (iron deficiency anemia) 01/16/2020   MI, old    Thyroid disease     Past Surgical History:  Procedure Laterality Date   CARDIAC CATHETERIZATION     CESAREAN SECTION     COLONOSCOPY WITH PROPOFOL N/A 03/16/2018   Procedure: COLONOSCOPY WITH PROPOFOL;  Surgeon: Virgel Manifold, MD;  Location: [facility] ENDOSCOPY;  Service: Endoscopy;  Laterality: N/A;   COLONOSCOPY WITH PROPOFOL N/A 07/09/2021   Procedure: COLONOSCOPY WITH PROPOFOL;  Surgeon: Virgel Manifold, MD;  Location: [facility] ENDOSCOPY;  Service: Endoscopy;  Laterality: N/A;   ESOPHAGOGASTRODUODENOSCOPY N/A 10/11/2019   Procedure: ESOPHAGOGASTRODUODENOSCOPY (EGD);  Surgeon: Lin Landsman, MD;  Location: [facility] ENDOSCOPY;  Service: Gastroenterology;  Laterality: N/A;   ESOPHAGOGASTRODUODENOSCOPY (EGD) WITH PROPOFOL N/A 10/27/2018   Procedure: ESOPHAGOGASTRODUODENOSCOPY (EGD) WITH PROPOFOL;  Surgeon: Virgel Manifold, MD;  Location: [facility] ENDOSCOPY;  Service: Endoscopy;  Laterality: N/A;   ESOPHAGOGASTRODUODENOSCOPY (EGD) WITH PROPOFOL N/A 06/25/2019   Procedure: ESOPHAGOGASTRODUODENOSCOPY (EGD) WITH PROPOFOL;  Surgeon: Jonathon Bellows, MD;  Location: [facility] ENDOSCOPY;  Service: Gastroenterology;  Laterality: N/A;   ESOPHAGOGASTRODUODENOSCOPY (EGD) WITH PROPOFOL N/A 09/12/2019   Procedure: ESOPHAGOGASTRODUODENOSCOPY (EGD) WITH PROPOFOL;  Surgeon: Lucilla Lame, MD;  Location: [facility] ENDOSCOPY;  Service: Endoscopy;  Laterality: N/A;   ESOPHAGOGASTRODUODENOSCOPY (EGD) WITH PROPOFOL N/A 11/20/2019   Procedure: ESOPHAGOGASTRODUODENOSCOPY (EGD) WITH PROPOFOL;   Surgeon: Virgel Manifold, MD;  Location: [facility] ENDOSCOPY;  Service: Endoscopy;  Laterality: N/A;   ESOPHAGOGASTRODUODENOSCOPY (EGD) WITH PROPOFOL N/A 11/23/2019   Procedure: ESOPHAGOGASTRODUODENOSCOPY (EGD) WITH PROPOFOL;  Surgeon: Virgel Manifold, MD;  Location: [facility] ENDOSCOPY;  Service: Endoscopy;  Laterality: N/A;   ESOPHAGOGASTRODUODENOSCOPY (EGD) WITH PROPOFOL N/A 01/03/2020   Procedure: ESOPHAGOGASTRODUODENOSCOPY (EGD) WITH PROPOFOL;  Surgeon: Lin Landsman, MD;  Location: [facility];  Service: Endoscopy;  Laterality: N/A;   ESOPHAGOGASTRODUODENOSCOPY (EGD) WITH PROPOFOL N/A 07/09/2021   Procedure: ESOPHAGOGASTRODUODENOSCOPY (EGD) WITH PROPOFOL;  Surgeon: Virgel Manifold, MD;  Location: [facility] ENDOSCOPY;  Service: Endoscopy;  Laterality: N/A;   TUBAL LIGATION      Family History  Problem Relation Age of Onset   Hypertension Other    CAD Father    Colon cancer Father    Lung cancer Father    Pancreatic cancer Brother     Social History   Socioeconomic History   Marital status: Single    Spouse name: Not on file   Number of children: Not on file   Years of education: Not on file   Highest education level: Not on file  Occupational History   Not on file  Tobacco Use   Smoking status: Never   Smokeless tobacco: Never  Vaping Use   Vaping Use: Never used  Substance and Sexual Activity   Alcohol use: Yes    Comment: rarely   Drug use: Never   Sexual activity: Not Currently  Other Topics Concern   Not on file  Social History Narrative   Not on file   Social Determinants of Health   Financial Resource  Strain: Not on file  Food Insecurity: Not on file  Transportation Needs: Not on file  Physical Activity: Not on file  Stress: Not on file  Social Connections: Not on file  Intimate Partner Violence: Not on file     Current Outpatient Medications:    ciprofloxacin-dexamethasone (CIPRODEX) OTIC suspension, Place 4 drops into both ears 2 (two)  times daily., Disp: 7.5 mL, Rfl: 0   rosuvastatin (CRESTOR) 10 MG tablet, Take 1 tablet (10 mg total) by mouth daily., Disp: 90 tablet, Rfl: 3   Biotin 10 MG CAPS, Take 1 capsule by mouth daily. (Patient not taking: Reported on 07/01/2021), Disp: , Rfl:    cyanocobalamin ([employer] VITAMIN B12) 1000 MCG tablet, Take 1 tablet (1,000 mcg total) by mouth daily., Disp: 90 tablet, Rfl: 1   cyclobenzaprine (FLEXERIL) 10 MG tablet, Take 10 mg by mouth daily as needed. (Patient not taking: Reported on 07/01/2021), Disp: , Rfl:    ferrous sulfate 325 (65 FE) MG EC tablet, TAKE 1 TABLET BY MOUTH EVERY DAY WITH BREAKFAST, Disp: 30 tablet, Rfl: 2   hydrOXYzine (ATARAX/VISTARIL) 25 MG tablet, Take 25 mg by mouth every 8 (eight) hours as needed., Disp: , Rfl:    lactulose (CHRONULAC) 10 GM/15ML solution, TAKE 15 MLS (10 G TOTAL) BY MOUTH DAILY FOR 30 DAYS., Disp: 1892 mL, Rfl: 0   levothyroxine (SYNTHROID) 150 MCG tablet, TAKE 1 TABLET BY MOUTH DAILY, 30 MINUTES BEFORE A MEAL, Disp: 30 tablet, Rfl: 1   nitroGLYCERIN (NITROLINGUAL) 0.4 MG/SPRAY spray, Place 1 spray under the tongue every 5 (five) minutes x 3 doses as needed for chest pain. (Patient not taking: No sig reported), Disp: 12 g, Rfl: 2   Oxycodone HCl 10 MG TABS, Take 10 mg by mouth 4 (four) times daily as needed., Disp: , Rfl:    XTAMPZA ER 9 MG C12A, Take 1 capsule by mouth every 12 (twelve) hours., Disp: , Rfl:    Allergies  Allergen Reactions   Vicodin [Hydrocodone-Acetaminophen] Rash   Amoxicillin Rash and Other (See Comments)    Has patient had a PCN reaction causing immediate rash, facial/tongue/throat swelling, SOB or lightheadedness with hypotension: Yes Has patient had a PCN reaction causing severe rash involving mucus membranes or skin necrosis: No Has patient had a PCN reaction that required hospitalization: No Has patient had a PCN reaction occurring within the last 10 years: No If all of the above answers are "NO", then may proceed with  Cephalosporin use.    Gabapentin Rash    ROS Review of Systems  Constitutional: Negative.   HENT:  Positive for ear pain.   Eyes: Negative.   Respiratory: Negative.    Cardiovascular: Negative.   Gastrointestinal: Negative.   Endocrine: Negative.   Genitourinary: Negative.   Musculoskeletal: Negative.   Skin: Negative.   Allergic/Immunologic: Negative.   Neurological: Negative.   Hematological: Negative.   Psychiatric/Behavioral: Negative.    All other systems reviewed and are negative.    Objective:    Physical Exam Vitals reviewed.  Constitutional:      Appearance: Normal appearance. She is obese. She is not ill-appearing or diaphoretic.  HENT:     Ears:     Comments: Otitis externa lt ear    Mouth/Throat:     Mouth: Mucous membranes are moist.  Eyes:     Pupils: Pupils are equal, round, and reactive to light.  Neck:     Vascular: No carotid bruit.  Cardiovascular:     Rate and Rhythm:  Normal rate and regular rhythm.     Pulses: Normal pulses.     Heart sounds: Normal heart sounds.  Pulmonary:     Effort: Pulmonary effort is normal.     Breath sounds: Normal breath sounds.  Abdominal:     General: Bowel sounds are normal.     Palpations: Abdomen is soft. There is no hepatomegaly, splenomegaly or mass.     Tenderness: There is no abdominal tenderness.     Hernia: No hernia is present.  Musculoskeletal:        General: No tenderness.     Cervical back: Neck supple.     Right lower leg: No edema.     Left lower leg: No edema.  Skin:    Findings: No rash.  Neurological:     Mental Status: She is alert and oriented to person, place, and time.     Motor: No weakness.  Psychiatric:        Mood and Affect: Mood and affect normal.        Behavior: Behavior normal.    BP (!) 145/85   Pulse 76   Ht '4\' 9"'$  (1.448 m)   Wt 199 lb 6.4 oz (90.4 kg)   LMP  (LMP Unknown) Comment: last menstrual 9 years ago, tubal ligation  BMI 43.15 kg/m  Wt Readings from Last  3 Encounters:  07/22/21 199 lb 6.4 oz (90.4 kg)  07/09/21 200 lb (90.7 kg)  07/01/21 202 lb (91.6 kg)     Health Maintenance Due  Topic Date Due   COVID-19 Vaccine (1) Never done   Pneumococcal Vaccine 18-71 Years old (1 - PCV) Never done   TETANUS/TDAP  Never done   Zoster Vaccines- Shingrix (1 of 2) Never done   PAP SMEAR-Modifier  Never done   MAMMOGRAM  Never done   INFLUENZA VACCINE  07/14/2021    There are no preventive care reminders to display for this patient.  Lab Results  Component Value Date   TSH 2.04 11/29/2020   Lab Results  Component Value Date   WBC 1.8 (L) 07/08/2021   HGB 12.2 07/08/2021   HCT 35.1 (L) 07/08/2021   MCV 87.5 07/08/2021   PLT 36 (L) 07/08/2021   Lab Results  Component Value Date   NA 142 07/08/2021   K 4.0 07/08/2021   CO2 26 07/08/2021   GLUCOSE 107 (H) 07/08/2021   BUN 11 07/08/2021   CREATININE 0.40 (L) 07/08/2021   BILITOT 1.1 07/08/2021   ALKPHOS 85 07/08/2021   AST 32 07/08/2021   ALT 20 07/08/2021   PROT 6.0 (L) 07/08/2021   ALBUMIN 3.3 (L) 07/08/2021   CALCIUM 9.0 07/08/2021   ANIONGAP 7 07/08/2021   Lab Results  Component Value Date   CHOL 107 12/29/2017   Lab Results  Component Value Date   HDL 21 (L) 08/25/2013   Lab Results  Component Value Date   LDLCALC 67 08/25/2013   Lab Results  Component Value Date   TRIG 107 08/25/2013   No results found for: [facility] Lab Results  Component Value Date   HGBA1C 4.9 12/29/2017      Assessment & Plan:   Problem List Items Addressed This Visit       Cardiovascular and Mediastinum   Essential hypertension     Patient denies any chest pain or shortness of breath there is no history of palpitation or paroxysmal nocturnal dyspnea   patient was advised to follow low-salt low-cholesterol diet  ideally I want to keep systolic blood pressure below 130 mmHg, patient was asked to check blood pressure one times a week and give me a report on that.  Patient will be  follow-up in 3 months  or earlier as needed, patient will call me back for any change in the cardiovascular symptoms Patient was advised to buy a book from local bookstore concerning blood pressure and read several chapters  every day.  This will be supplemented by some of the material we will give him from the office.  Patient should also utilize other resources like YouTube and Internet to learn more about the blood pressure and the diet.       Relevant Medications   rosuvastatin (CRESTOR) 10 MG tablet   Angina of effort (HCC)    Anxiety stable I started the patient on cholesterol medicine       Relevant Medications   rosuvastatin (CRESTOR) 10 MG tablet     Respiratory   Oropharyngeal dysphagia     Nervous and Auditory   Acute otitis externa of both ears - Primary    Left ear is inflamed with otitis external I started the patient on Ciprodex she will come back to see me in 10 days       Relevant Medications   ciprofloxacin-dexamethasone (CIPRODEX) OTIC suspension     Musculoskeletal and Integument   Neck arthropathy    Stable         Other   Thrombocytopenia (HCC)    Stable at the present time.       Hyperlipidemia   Relevant Medications   rosuvastatin (CRESTOR) 10 MG tablet   Obesity (BMI 35.0-39.9 without comorbidity)    - I encouraged the patient to lose weight.  - I educated them on making healthy dietary choices including eating more fruits and vegetables and less fried foods. - I encouraged the patient to exercise more, and educated on the benefits of exercise including weight loss, diabetes prevention, and hypertension prevention.   Dietary counseling with a registered dietician  Referral to a weight management support group (e.g. Weight Watchers, Overeaters Anonymous)  If your BMI is greater than 29 or you have gained more than 15 pounds you should work on weight loss.  Attend a healthy cooking class         Meds ordered this encounter  Medications    ciprofloxacin-dexamethasone (CIPRODEX) OTIC suspension    Sig: Place 4 drops into both ears 2 (two) times daily.    Dispense:  7.5 mL    Refill:  0   rosuvastatin (CRESTOR) 10 MG tablet    Sig: Take 1 tablet (10 mg total) by mouth daily.    Dispense:  90 tablet    Refill:  3    Follow-up: No follow-ups on file.    Cletis Athens, MD

## 2021-07-22 NOTE — Assessment & Plan Note (Signed)
Stable at the present time. 

## 2021-07-22 NOTE — Assessment & Plan Note (Signed)

## 2021-07-23 ENCOUNTER — Telehealth: Payer: Self-pay

## 2021-07-23 NOTE — Telephone Encounter (Signed)
Called [facility] to check on the referral and they state they have the referral but the patient has not been called yet. They should be calling her soon but the patient can call them at [phone]. Called patient and left a detail message and sent mychart message

## 2021-08-05 ENCOUNTER — Ambulatory Visit (INDEPENDENT_AMBULATORY_CARE_PROVIDER_SITE_OTHER): Payer: Medicaid Other | Admitting: Internal Medicine

## 2021-08-18 ENCOUNTER — Other Ambulatory Visit: Payer: Self-pay | Admitting: Oncology

## 2021-08-25 DIAGNOSIS — Z8619 Personal history of other infectious and parasitic diseases: Secondary | ICD-10-CM | POA: Diagnosis not present

## 2021-08-25 DIAGNOSIS — K760 Fatty (change of) liver, not elsewhere classified: Secondary | ICD-10-CM | POA: Insufficient documentation

## 2021-08-25 DIAGNOSIS — I8501 Esophageal varices with bleeding: Secondary | ICD-10-CM | POA: Diagnosis not present

## 2021-08-25 DIAGNOSIS — K317 Polyp of stomach and duodenum: Secondary | ICD-10-CM | POA: Diagnosis not present

## 2021-09-05 ENCOUNTER — Other Ambulatory Visit: Payer: Self-pay | Admitting: Internal Medicine

## 2021-09-08 ENCOUNTER — Telehealth: Payer: Self-pay | Admitting: Gastroenterology

## 2021-09-08 DIAGNOSIS — K317 Polyp of stomach and duodenum: Secondary | ICD-10-CM

## 2021-09-08 NOTE — Telephone Encounter (Signed)
Pt returned call and was transferred to nurse extension.

## 2021-09-08 NOTE — Telephone Encounter (Signed)
The pt has been advised and will keep appt as planned. She will come in for labs prior.  She was given the address as well.  The pt has been advised of the information and verbalized understanding.

## 2021-09-08 NOTE — Telephone Encounter (Signed)
10/10/21 at 830 am appt with DJ. Labs entered to be completed a day or two prior to appt.    Left message on machine to call back

## 2021-09-08 NOTE — Telephone Encounter (Signed)
I spoke with Dr. Harlow Asa.  Brandi Bright, can you offer her my first available NGI appt (not with extender) to discuss gastric polyp.  Would be great to get CBC, cmet, INR a day or two prior.  Dr. Gala Lewandowsky office will be giving her a heads up abou this as well.  Thanks

## 2021-09-15 ENCOUNTER — Other Ambulatory Visit: Payer: Self-pay | Admitting: *Deleted

## 2021-09-15 MED ORDER — LEVOTHYROXINE SODIUM 150 MCG PO TABS: ORAL_TABLET | ORAL | 6 refills | Status: DC

## 2021-09-16 DIAGNOSIS — Z79891 Long term (current) use of opiate analgesic: Secondary | ICD-10-CM | POA: Diagnosis not present

## 2021-09-16 DIAGNOSIS — M542 Cervicalgia: Secondary | ICD-10-CM | POA: Diagnosis not present

## 2021-09-16 DIAGNOSIS — Z79899 Other long term (current) drug therapy: Secondary | ICD-10-CM | POA: Diagnosis not present

## 2021-09-16 DIAGNOSIS — G894 Chronic pain syndrome: Secondary | ICD-10-CM | POA: Diagnosis not present

## 2021-09-16 DIAGNOSIS — M5412 Radiculopathy, cervical region: Secondary | ICD-10-CM | POA: Diagnosis not present

## 2021-10-07 ENCOUNTER — Other Ambulatory Visit: Payer: Self-pay

## 2021-10-07 ENCOUNTER — Encounter: Payer: Self-pay | Admitting: Internal Medicine

## 2021-10-07 ENCOUNTER — Ambulatory Visit (INDEPENDENT_AMBULATORY_CARE_PROVIDER_SITE_OTHER): Payer: Medicaid Other | Admitting: Internal Medicine

## 2021-10-07 VITALS — BP 124/87 | HR 83 | Ht <= 58 in | Wt 200.2 lb

## 2021-10-07 DIAGNOSIS — F32A Depression, unspecified: Secondary | ICD-10-CM | POA: Diagnosis not present

## 2021-10-07 DIAGNOSIS — I208 Other forms of angina pectoris: Secondary | ICD-10-CM | POA: Diagnosis not present

## 2021-10-07 DIAGNOSIS — I10 Essential (primary) hypertension: Secondary | ICD-10-CM | POA: Diagnosis not present

## 2021-10-07 DIAGNOSIS — F419 Anxiety disorder, unspecified: Secondary | ICD-10-CM

## 2021-10-07 DIAGNOSIS — K766 Portal hypertension: Secondary | ICD-10-CM | POA: Diagnosis not present

## 2021-10-07 DIAGNOSIS — E039 Hypothyroidism, unspecified: Secondary | ICD-10-CM

## 2021-10-07 DIAGNOSIS — I8500 Esophageal varices without bleeding: Secondary | ICD-10-CM

## 2021-10-07 NOTE — Assessment & Plan Note (Signed)
Stable at the present time. 

## 2021-10-07 NOTE — Assessment & Plan Note (Signed)
-   Patient experiencing high levels of anxiety.  - Encouraged patient to engage in relaxing activities like yoga, meditation, journaling, going for a walk, or participating in a hobby.  - Encouraged patient to reach out to trusted friends or family members about recent struggles, Patient was advised to read A book, how to stop worrying and start living, it is good book to read to control  the stress  

## 2021-10-07 NOTE — Assessment & Plan Note (Signed)

## 2021-10-07 NOTE — Assessment & Plan Note (Signed)
Denies any history of chest pain at rest or on regular activity

## 2021-10-07 NOTE — Assessment & Plan Note (Signed)
There is no heartburn or any more bleeding

## 2021-10-07 NOTE — Progress Notes (Signed)
Established Patient Office Visit  Subjective:  Patient ID: Brandi Bright, female    DOB: 11-25-1971  Age: 50 y.o. MRN: [id number]  CC:  Chief Complaint  Patient presents with   Headache    Patient complains of a headache every morning till noon. She also is having a hard time sleeping at night about 3x a week.       Brandi Bright presents for recurrent headache Past Medical History:  Diagnosis Date   Arthritis    Back pain    Gout    Hypothyroidism    IDA (iron deficiency anemia) 01/16/2020   MI, old    Thyroid disease     Past Surgical History:  Procedure Laterality Date   CARDIAC CATHETERIZATION     CESAREAN SECTION     COLONOSCOPY WITH PROPOFOL N/A 03/16/2018   Procedure: COLONOSCOPY WITH PROPOFOL;  Surgeon: Virgel Manifold, MD;  Location: [facility] ENDOSCOPY;  Service: Endoscopy;  Laterality: N/A;   COLONOSCOPY WITH PROPOFOL N/A 07/09/2021   Procedure: COLONOSCOPY WITH PROPOFOL;  Surgeon: Virgel Manifold, MD;  Location: [facility] ENDOSCOPY;  Service: Endoscopy;  Laterality: N/A;   ESOPHAGOGASTRODUODENOSCOPY N/A 10/11/2019   Procedure: ESOPHAGOGASTRODUODENOSCOPY (EGD);  Surgeon: Lin Landsman, MD;  Location: [facility] ENDOSCOPY;  Service: Gastroenterology;  Laterality: N/A;   ESOPHAGOGASTRODUODENOSCOPY (EGD) WITH PROPOFOL N/A 10/27/2018   Procedure: ESOPHAGOGASTRODUODENOSCOPY (EGD) WITH PROPOFOL;  Surgeon: Virgel Manifold, MD;  Location: [facility] ENDOSCOPY;  Service: Endoscopy;  Laterality: N/A;   ESOPHAGOGASTRODUODENOSCOPY (EGD) WITH PROPOFOL N/A 06/25/2019   Procedure: ESOPHAGOGASTRODUODENOSCOPY (EGD) WITH PROPOFOL;  Surgeon: Jonathon Bellows, MD;  Location: [facility] ENDOSCOPY;  Service: Gastroenterology;  Laterality: N/A;   ESOPHAGOGASTRODUODENOSCOPY (EGD) WITH PROPOFOL N/A 09/12/2019   Procedure: ESOPHAGOGASTRODUODENOSCOPY (EGD) WITH PROPOFOL;  Surgeon: Lucilla Lame, MD;  Location: [facility] ENDOSCOPY;  Service: Endoscopy;  Laterality: N/A;   ESOPHAGOGASTRODUODENOSCOPY (EGD) WITH  PROPOFOL N/A 11/20/2019   Procedure: ESOPHAGOGASTRODUODENOSCOPY (EGD) WITH PROPOFOL;  Surgeon: Virgel Manifold, MD;  Location: [facility] ENDOSCOPY;  Service: Endoscopy;  Laterality: N/A;   ESOPHAGOGASTRODUODENOSCOPY (EGD) WITH PROPOFOL N/A 11/23/2019   Procedure: ESOPHAGOGASTRODUODENOSCOPY (EGD) WITH PROPOFOL;  Surgeon: Virgel Manifold, MD;  Location: [facility] ENDOSCOPY;  Service: Endoscopy;  Laterality: N/A;   ESOPHAGOGASTRODUODENOSCOPY (EGD) WITH PROPOFOL N/A 01/03/2020   Procedure: ESOPHAGOGASTRODUODENOSCOPY (EGD) WITH PROPOFOL;  Surgeon: Lin Landsman, MD;  Location: [place];  Service: Endoscopy;  Laterality: N/A;   ESOPHAGOGASTRODUODENOSCOPY (EGD) WITH PROPOFOL N/A 07/09/2021   Procedure: ESOPHAGOGASTRODUODENOSCOPY (EGD) WITH PROPOFOL;  Surgeon: Virgel Manifold, MD;  Location: [facility] ENDOSCOPY;  Service: Endoscopy;  Laterality: N/A;   TUBAL LIGATION      Family History  Problem Relation Age of Onset   Hypertension Other    CAD Father    Colon cancer Father    Lung cancer Father    Pancreatic cancer Brother     Social History   Socioeconomic History   Marital status: Single    Spouse name: Not on file   Number of children: Not on file   Years of education: Not on file   Highest education level: Not on file  Occupational History   Not on file  Tobacco Use   Smoking status: Never   Smokeless tobacco: Never  Vaping Use   Vaping Use: Never used  Substance and Sexual Activity   Alcohol use: Yes    Comment: rarely   Drug use: Never   Sexual activity: Not Currently  Other Topics Concern   Not on file  Social History Narrative  Not on file   Social Determinants of Health   Financial Resource Strain: Not on file  Food Insecurity: Not on file  Transportation Needs: Not on file  Physical Activity: Not on file  Stress: Not on file  Social Connections: Not on file  Intimate Partner Violence: Not on file     Current Outpatient Medications:    Biotin 10  MG CAPS, Take 1 capsule by mouth daily. (Patient not taking: Reported on 07/01/2021), Disp: , Rfl:    ciprofloxacin-dexamethasone (CIPRODEX) OTIC suspension, Place 4 drops into both ears 2 (two) times daily., Disp: 7.5 mL, Rfl: 0   cyanocobalamin ([employer] VITAMIN B12) 1000 MCG tablet, Take 1 tablet (1,000 mcg total) by mouth daily., Disp: 90 tablet, Rfl: 1   ferrous sulfate 325 (65 FE) MG EC tablet, TAKE 1 TABLET BY MOUTH EVERY DAY WITH BREAKFAST, Disp: 30 tablet, Rfl: 2   lactulose (CHRONULAC) 10 GM/15ML solution, TAKE 15 MLS (10 G TOTAL) BY MOUTH DAILY FOR 30 DAYS., Disp: 1892 mL, Rfl: 0   levothyroxine (SYNTHROID) 150 MCG tablet, TAKE 1 TABLET BY MOUTH DAILY, 30 MINUTES BEFORE A MEAL, Disp: 30 tablet, Rfl: 6   nitroGLYCERIN (NITROLINGUAL) 0.4 MG/SPRAY spray, Place 1 spray under the tongue every 5 (five) minutes x 3 doses as needed for chest pain. (Patient not taking: No sig reported), Disp: 12 g, Rfl: 2   Oxycodone HCl 10 MG TABS, Take 10 mg by mouth 4 (four) times daily as needed., Disp: , Rfl:    rosuvastatin (CRESTOR) 10 MG tablet, Take 1 tablet (10 mg total) by mouth daily., Disp: 90 tablet, Rfl: 3   XTAMPZA ER 9 MG C12A, Take 1 capsule by mouth every 12 (twelve) hours., Disp: , Rfl:    Allergies  Allergen Reactions   Vicodin [Hydrocodone-Acetaminophen] Rash   Amoxicillin Rash and Other (See Comments)    Has patient had a PCN reaction causing immediate rash, facial/tongue/throat swelling, SOB or lightheadedness with hypotension: Yes Has patient had a PCN reaction causing severe rash involving mucus membranes or skin necrosis: No Has patient had a PCN reaction that required hospitalization: No Has patient had a PCN reaction occurring within the last 10 years: No If all of the above answers are "NO", then may proceed with Cephalosporin use.    Gabapentin Rash    ROS Review of Systems  Constitutional: Negative.   HENT: Negative.    Eyes: Negative.   Respiratory: Negative.     Cardiovascular: Negative.   Gastrointestinal: Negative.   Endocrine: Negative.   Genitourinary: Negative.   Musculoskeletal: Negative.   Skin: Negative.   Allergic/Immunologic: Negative.   Neurological:  Positive for headaches.  Hematological: Negative.   Psychiatric/Behavioral: Negative.    All other systems reviewed and are negative.    Objective:    Physical Exam Vitals reviewed.  Constitutional:      Appearance: Normal appearance.  HENT:     Mouth/Throat:     Mouth: Mucous membranes are moist.  Eyes:     Pupils: Pupils are equal, round, and reactive to light.  Neck:     Vascular: No carotid bruit.  Cardiovascular:     Rate and Rhythm: Normal rate and regular rhythm.     Pulses: Normal pulses.     Heart sounds: Normal heart sounds.  Pulmonary:     Effort: Pulmonary effort is normal.     Breath sounds: Normal breath sounds.  Abdominal:     General: Bowel sounds are normal.     Palpations:  Abdomen is soft. There is no hepatomegaly, splenomegaly or mass.     Tenderness: There is no abdominal tenderness.     Hernia: No hernia is present.  Musculoskeletal:        General: No tenderness.     Cervical back: Neck supple.     Right lower leg: No edema.     Left lower leg: No edema.  Skin:    Findings: No rash.  Neurological:     Mental Status: She is alert and oriented to person, place, and time.     Motor: No weakness.  Psychiatric:        Mood and Affect: Mood and affect normal.        Behavior: Behavior normal.    BP 124/87   Pulse 83   Ht 4\' 9"  (1.448 m)   Wt 200 lb 3.2 oz (90.8 kg)   BMI 43.32 kg/m  Wt Readings from Last 3 Encounters:  10/07/21 200 lb 3.2 oz (90.8 kg)  07/22/21 199 lb 6.4 oz (90.4 kg)  07/09/21 200 lb (90.7 kg)     Health Maintenance Due  Topic Date Due   COVID-19 Vaccine (1) Never done   Pneumococcal Vaccine 41-56 Years old (1 - PCV) Never done   TETANUS/TDAP  Never done   PAP SMEAR-Modifier  Never done   MAMMOGRAM  Never done    Zoster Vaccines- Shingrix (1 of 2) Never done   INFLUENZA VACCINE  Never done    There are no preventive care reminders to display for this patient.  Lab Results  Component Value Date   TSH 2.04 11/29/2020   Lab Results  Component Value Date   WBC 1.8 (L) 07/08/2021   HGB 12.2 07/08/2021   HCT 35.1 (L) 07/08/2021   MCV 87.5 07/08/2021   PLT 36 (L) 07/08/2021   Lab Results  Component Value Date   NA 142 07/08/2021   K 4.0 07/08/2021   CO2 26 07/08/2021   GLUCOSE 107 (H) 07/08/2021   BUN 11 07/08/2021   CREATININE 0.40 (L) 07/08/2021   BILITOT 1.1 07/08/2021   ALKPHOS 85 07/08/2021   AST 32 07/08/2021   ALT 20 07/08/2021   PROT 6.0 (L) 07/08/2021   ALBUMIN 3.3 (L) 07/08/2021   CALCIUM 9.0 07/08/2021   ANIONGAP 7 07/08/2021   Lab Results  Component Value Date   CHOL 107 12/29/2017   Lab Results  Component Value Date   HDL 21 (L) 08/25/2013   Lab Results  Component Value Date   LDLCALC 67 08/25/2013   Lab Results  Component Value Date   TRIG 107 08/25/2013   No results found for: [facility] Lab Results  Component Value Date   HGBA1C 4.9 12/29/2017      Assessment & Plan:   Problem List Items Addressed This Visit       Cardiovascular and Mediastinum   Esophageal varices without bleeding (HCC)    There is no heartburn or any more bleeding      Essential hypertension - Primary    The following hypertensive lifestyle modification were recommended and discussed:  1. Limiting alcohol intake to less than 1 oz/day of ethanol:(24 oz of beer or 8 oz of wine or 2 oz of 100-proof whiskey). 2. Take baby ASA 81 mg daily. 3. Importance of regular aerobic exercise and losing weight. 4. Reduce dietary saturated fat and cholesterol intake for overall cardiovascular health. 5. Maintaining adequate dietary potassium, calcium, and magnesium intake. 6. Regular monitoring of the blood  pressure. 7. Reduce sodium intake to less than 100 mmol/day (less than 2.3 gm of  sodium or less than 6 gm of sodium choride)       Portal hypertension (HCC)    Stable at the present time      Angina of effort [facility])    Denies any history of chest pain at rest or on regular activity        Endocrine   Acquired hypothyroidism    Stable at the present time        Other   Anxiety and depression    - Patient experiencing high levels of anxiety.  - Encouraged patient to engage in relaxing activities like yoga, meditation, journaling, going for a walk, or participating in a hobby.  - Encouraged patient to reach out to trusted friends or family members about recent struggles, Patient was advised to read A book, how to stop worrying and start living, it is good book to read to control  the stress      Headache is under control  No orders of the defined types were placed in this encounter.   Follow-up: No follow-ups on file.    Cletis Athens, MD

## 2021-10-10 ENCOUNTER — Ambulatory Visit: Payer: Medicaid Other | Admitting: Gastroenterology

## 2021-10-15 ENCOUNTER — Encounter: Payer: Self-pay | Admitting: Gastroenterology

## 2021-10-15 ENCOUNTER — Other Ambulatory Visit: Payer: Self-pay

## 2021-10-15 ENCOUNTER — Ambulatory Visit: Payer: Medicaid Other | Admitting: Gastroenterology

## 2021-10-15 VITALS — BP 134/89 | HR 66 | Temp 98.4°F | Wt 199.4 lb

## 2021-10-15 DIAGNOSIS — K7460 Unspecified cirrhosis of liver: Secondary | ICD-10-CM | POA: Diagnosis not present

## 2021-10-15 DIAGNOSIS — K644 Residual hemorrhoidal skin tags: Secondary | ICD-10-CM

## 2021-10-15 NOTE — Progress Notes (Signed)
Brandi Antigua, MD [address]  [place]  [place], [place] [address]  Main: [phone]  Fax: [phone]   Primary Care Physician: Cletis Athens, MD   Chief complaint: Cirrhosis  HPI: Brandi Bright is a 50 y.o. female here for follow-up of cirrhosis.  Denies any confusion.  Reports intermittent bright red blood per rectum from her external hemorrhoids, on tissue paper only.  No nausea or vomiting.  Patient has an upcoming appointment at [facility] for removal of large gastric hyperplastic polyp.  She was seen at [facility] surgery, and was referred there for her external hemorrhoids.  However, she states, they only talked to her about her gastric polyp and not her external hemorrhoids.  Upper endoscopy July 2022 Impression:            - Grade I esophageal varices with no bleeding and no                         stigmata of recent bleeding.                        - A single gastric polyp. Biopsied.                        - Multiple gastric polyps. Biopsied.                        - Normal examined duodenum.  Colonoscopy Impression:            - Hemorrhoids found on perianal exam.                        - One 3 mm polyp in the cecum, removed with a cold                         biopsy forceps. Resected and retrieved.                        - One 5 mm polyp in the transverse colon, removed with                         a cold snare. Resected and retrieved.                        - Two 3 to 4 mm polyps in the rectum and in the                         sigmoid colon, removed with a cold biopsy forceps.                         Resected and retrieved.                        - The examination was otherwise normal.                        - The rectum, sigmoid colon, descending colon,                         transverse colon, ascending colon and cecum are normal.                        -  The distal rectum and anal verge are normal on                         retroflexion  view.  DIAGNOSIS:  A. STOMACH POLYP, ANTRUM; COLD BIOPSY:  - HYPERPLASTIC POLYP, ACUTELY INFLAMED.  - NEGATIVE FOR DYSPLASIA AND MALIGNANCY.   Comment:  Immunohistochemical stain for H pylori will be reported in an addendum.   B. STOMACH POLYP; COLD BIOPSY:  - HYPERPLASTIC POLYP.  - NEGATIVE FOR INTESTINAL METAPLASIA, DYSPLASIA, AND MALIGNANCY.   C. COLON POLYP, CECUM; COLD BIOPSY:  - TUBULAR ADENOMA.  - NEGATIVE FOR HIGH GRADE DYSPLASIA AND MALIGNANCY.   D. COLON POLYP, TRANSVERSE; COLD SNARE:  - TUBULAR ADENOMA.  - NEGATIVE FOR HIGH GRADE DYSPLASIA AND MALIGNANCY.   E. COLON POLYP X2, SIGMOID AND RECTUM; COLD BIOPSY:  - HYPERPLASTIC POLYP, TWO FRAGMENTS.  - NEGATIVE FOR DYSPLASIA AND MALIGNANCY.    Previous history: upper endoscopy in January 2021 by Dr. Marius Ditch with portal hypertensive gastropathy seen, small esophageal varices with no banding needed.  Repeat EGD was recommended in 3 months for surveillance.  Upper endoscopy Dec 2020, showed grade 2 esophageal varices, portal hypertensive gastropathy.  4 bands were placed.  Scarring from previous treatments seen.  History of Hep C - treated with Epclusa 2020    ROS: All ROS reviewed and negative except as per HPI   Past Medical History:  Diagnosis Date   Arthritis    Back pain    Gout    Hypothyroidism    IDA (iron deficiency anemia) 01/16/2020   MI, old    Thyroid disease     Past Surgical History:  Procedure Laterality Date   CARDIAC CATHETERIZATION     CESAREAN SECTION     COLONOSCOPY WITH PROPOFOL N/A 03/16/2018   Procedure: COLONOSCOPY WITH PROPOFOL;  Surgeon: Virgel Manifold, MD;  Location: [facility] ENDOSCOPY;  Service: Endoscopy;  Laterality: N/A;   COLONOSCOPY WITH PROPOFOL N/A 07/09/2021   Procedure: COLONOSCOPY WITH PROPOFOL;  Surgeon: Virgel Manifold, MD;  Location: [facility] ENDOSCOPY;  Service: Endoscopy;  Laterality: N/A;   ESOPHAGOGASTRODUODENOSCOPY N/A 10/11/2019   Procedure:  ESOPHAGOGASTRODUODENOSCOPY (EGD);  Surgeon: Lin Landsman, MD;  Location: [facility] ENDOSCOPY;  Service: Gastroenterology;  Laterality: N/A;   ESOPHAGOGASTRODUODENOSCOPY (EGD) WITH PROPOFOL N/A 10/27/2018   Procedure: ESOPHAGOGASTRODUODENOSCOPY (EGD) WITH PROPOFOL;  Surgeon: Virgel Manifold, MD;  Location: [facility] ENDOSCOPY;  Service: Endoscopy;  Laterality: N/A;   ESOPHAGOGASTRODUODENOSCOPY (EGD) WITH PROPOFOL N/A 06/25/2019   Procedure: ESOPHAGOGASTRODUODENOSCOPY (EGD) WITH PROPOFOL;  Surgeon: Jonathon Bellows, MD;  Location[facility] ENDOSCOPY;  Service: Gastroenterology;  Laterality: N/A;   ESOPHAGOGASTRODUODENOSCOPY (EGD) WITH PROPOFOL N/A 09/12/2019   Procedure: ESOPHAGOGASTRODUODENOSCOPY (EGD) WITH PROPOFOL;  Surgeon: Lucilla Lame, MD;  Location: [facility] ENDOSCOPY;  Service: Endoscopy;  Laterality: N/A;   ESOPHAGOGASTRODUODENOSCOPY (EGD) WITH PROPOFOL N/A 11/20/2019   Procedure: ESOPHAGOGASTRODUODENOSCOPY (EGD) WITH PROPOFOL;  Surgeon: Virgel Manifold, MD;  Location: [facility] ENDOSCOPY;  Service: Endoscopy;  Laterality: N/A;   ESOPHAGOGASTRODUODENOSCOPY (EGD) WITH PROPOFOL N/A 11/23/2019   Procedure: ESOPHAGOGASTRODUODENOSCOPY (EGD) WITH PROPOFOL;  Surgeon: Virgel Manifold, MD;  Location: [facility] ENDOSCOPY;  Service: Endoscopy;  Laterality: N/A;   ESOPHAGOGASTRODUODENOSCOPY (EGD) WITH PROPOFOL N/A 01/03/2020   Procedure: ESOPHAGOGASTRODUODENOSCOPY (EGD) WITH PROPOFOL;  Surgeon: Lin Landsman, MD;  Location: [place];  Service: Endoscopy;  Laterality: N/A;   ESOPHAGOGASTRODUODENOSCOPY (EGD) WITH PROPOFOL N/A 07/09/2021   Procedure: ESOPHAGOGASTRODUODENOSCOPY (EGD) WITH PROPOFOL;  Surgeon: Virgel Manifold, MD;  Location: [facility] ENDOSCOPY;  Service: Endoscopy;  Laterality: N/A;   TUBAL LIGATION      Prior to Admission medications   Medication Sig Start Date End Date Taking? Authorizing Provider  Biotin 10 MG CAPS Take 1 capsule by mouth daily.   Yes [provider]   cyanocobalamin ([employer] VITAMIN B12) 1000 MCG tablet Take 1 tablet (1,000 mcg total) by mouth daily. 10/22/20  Yes Earlie Server, MD  ferrous sulfate 325 (65 FE) MG EC tablet TAKE 1 TABLET BY MOUTH EVERY DAY WITH BREAKFAST 08/18/21  Yes Earlie Server, MD  lactulose (CHRONULAC) 10 GM/15ML solution TAKE 15 MLS (10 G TOTAL) BY MOUTH DAILY FOR 30 DAYS. 04/09/21  Yes Virgel Manifold, MD  levothyroxine (SYNTHROID) 150 MCG tablet TAKE 1 TABLET BY MOUTH DAILY, 30 MINUTES BEFORE A MEAL 09/15/21  Yes Masoud, Viann Shove, MD  nitroGLYCERIN (NITROLINGUAL) 0.4 MG/SPRAY spray Place 1 spray under the tongue every 5 (five) minutes x 3 doses as needed for chest pain. 04/24/20  Yes Masoud, Viann Shove, MD  Oxycodone HCl 10 MG TABS Take 10 mg by mouth 4 (four) times daily as needed. 10/14/20  Yes [provider]  XTAMPZA ER 9 MG C12A Take 1 capsule by mouth every 12 (twelve) hours. 06/18/21  Yes [provider]  rosuvastatin (CRESTOR) 10 MG tablet Take 1 tablet (10 mg total) by mouth daily. Patient not taking: Reported on 10/15/2021 07/22/21   Cletis Athens, MD    Family History  Problem Relation Age of Onset   Hypertension Other    CAD Father    Colon cancer Father    Lung cancer Father    Pancreatic cancer Brother      Social History   Tobacco Use   Smoking status: Never   Smokeless tobacco: Never  Vaping Use   Vaping Use: Never used  Substance Use Topics   Alcohol use: Yes    Comment: rarely   Drug use: Never    Allergies as of 10/15/2021 - Review Complete 10/15/2021  Allergen Reaction Noted   Vicodin [hydrocodone-acetaminophen] Rash 01/11/2016   Amoxicillin Rash and Other (See Comments) 01/11/2016   Gabapentin Rash 01/11/2016    Physical Examination:  Constitutional: General:   Alert,  Well-developed, well-nourished, pleasant and cooperative in NAD BP 134/89   Pulse 66   Temp 98.4 F (36.9 C) (Oral)   Wt 199 lb 6.4 oz (90.4 kg)   BMI 43.15 kg/m   Respiratory: Normal respiratory  effort  Gastrointestinal:  Soft, non-tender and non-distended without masses, hepatosplenomegaly or hernias noted.  No guarding or rebound tenderness.     Cardiac: No clubbing or edema.  No cyanosis. Normal posterior tibial pedal pulses noted.  Psych:  Alert and cooperative. Normal mood and affect.  Musculoskeletal:  Normal gait. Head normocephalic, atraumatic. Symmetrical without gross deformities. 5/5 Lower extremity strength bilaterally.  Skin: Warm. Intact without significant lesions or rashes. No jaundice.  Neck: Supple, trachea midline  Lymph: No cervical lymphadenopathy  Psych:  Alert and oriented x3, Alert and cooperative. Normal mood and affect.  Labs: CMP     Component Value Date/Time   NA 142 07/08/2021 0854   NA 144 11/06/2019 1418   NA 137 08/25/2013 0617   K 4.0 07/08/2021 0854   K 4.0 08/25/2013 0617   CL 109 07/08/2021 0854   CL 104 08/25/2013 0617   CO2 26 07/08/2021 0854   CO2 29 08/25/2013 0617   GLUCOSE 107 (H) 07/08/2021 0854   GLUCOSE 97 08/25/2013 0617   BUN 11 07/08/2021  0854   BUN 8 11/06/2019 1418   BUN 9 08/25/2013 0617   CREATININE 0.40 (L) 07/08/2021 0854   CREATININE 0.52 08/26/2020 1252   CALCIUM 9.0 07/08/2021 0854   CALCIUM 8.8 08/25/2013 0617   PROT 6.0 (L) 07/08/2021 0854   PROT 7.2 11/06/2019 1418   PROT 8.8 (H) 05/30/2013 1242   ALBUMIN 3.3 (L) 07/08/2021 0854   ALBUMIN 3.7 (L) 11/06/2019 1418   ALBUMIN 4.4 05/30/2013 1242   AST 32 07/08/2021 0854   AST 163 (H) 05/30/2013 1242   ALT 20 07/08/2021 0854   ALT 144 (H) 05/30/2013 1242   ALKPHOS 85 07/08/2021 0854   ALKPHOS 89 05/30/2013 1242   BILITOT 1.1 07/08/2021 0854   BILITOT 1.1 11/06/2019 1418   BILITOT 0.8 05/30/2013 1242   GFRNONAA >60 07/08/2021 0854   GFRNONAA 111 08/26/2020 1252   GFRAA 129 08/26/2020 1252   Lab Results  Component Value Date   WBC 1.8 (L) 07/08/2021   HGB 12.2 07/08/2021   HCT 35.1 (L) 07/08/2021   MCV 87.5 07/08/2021   PLT 36 (L) 07/08/2021     Imaging Studies:   Assessment and Plan:   MAKINZY CLEERE is a 50 y.o. y/o female here for follow-up of cirrhosis from hep C, varices, portal hypertensive gastropathy  Obtain right upper quadrant ultrasound at this time for [facility] screening  I have reached out to Dr. Juleen Starr surgeon, to discuss if they can see her for her external hemorrhoids.  External hemorrhoids were noted to be large, see image on colonoscopy report.  Patient is reporting intermittent bright red blood per rectum for this.  Under Media, a scanned referral from 07/24/21 shows that the reason for referral to [facility] was for "External hemorrhoids". The note by [place] talks about the gastric polyp and recommended endoscopic removal and this was already my plan for this pt, hence the referral to [facility] previously made by [place] on 07/15/2021.  I messaged Dr. Harlow Asa via epic to clarify and see if they would be addressing patient's external hemorrhoids and am waiting to hear back.    Dr Brandi Bright

## 2021-10-15 NOTE — Patient Instructions (Addendum)
Your ultrasound has been scheduled at [facility] entrance, arrive at 7:45am on October 22, 2021.  You cannot have anything to eat after 12 midnight the day before.

## 2021-10-20 ENCOUNTER — Encounter: Payer: Self-pay | Admitting: Gastroenterology

## 2021-10-20 ENCOUNTER — Ambulatory Visit (INDEPENDENT_AMBULATORY_CARE_PROVIDER_SITE_OTHER): Payer: Medicaid Other | Admitting: Gastroenterology

## 2021-10-20 ENCOUNTER — Other Ambulatory Visit (INDEPENDENT_AMBULATORY_CARE_PROVIDER_SITE_OTHER): Payer: Medicaid Other

## 2021-10-20 VITALS — BP 124/80 | HR 84 | Ht <= 58 in | Wt 194.0 lb

## 2021-10-20 DIAGNOSIS — K317 Polyp of stomach and duodenum: Secondary | ICD-10-CM

## 2021-10-20 LAB — CBC WITH DIFFERENTIAL/PLATELET
Basophils Absolute: 0 10*3/uL (ref 0.0–0.1)
Basophils Relative: 0.3 % (ref 0.0–3.0)
Eosinophils Absolute: 0 10*3/uL (ref 0.0–0.7)
Eosinophils Relative: 1.9 % (ref 0.0–5.0)
HCT: 36.6 % (ref 36.0–46.0)
Hemoglobin: 12.9 g/dL (ref 12.0–15.0)
Lymphocytes Relative: 35.9 % (ref 12.0–46.0)
Lymphs Abs: 0.8 10*3/uL (ref 0.7–4.0)
MCHC: 35.3 g/dL (ref 30.0–36.0)
MCV: 85.7 fl (ref 78.0–100.0)
Monocytes Absolute: 0.2 10*3/uL (ref 0.1–1.0)
Monocytes Relative: 6.6 % (ref 3.0–12.0)
Neutro Abs: 1.3 10*3/uL — ABNORMAL LOW (ref 1.4–7.7)
Neutrophils Relative %: 55.3 % (ref 43.0–77.0)
Platelets: 44 10*3/uL — CL (ref 150.0–400.0)
RBC: 4.27 Mil/uL (ref 3.87–5.11)
RDW: 13.5 % (ref 11.5–15.5)
WBC: 2.4 10*3/uL — ABNORMAL LOW (ref 4.0–10.5)

## 2021-10-20 LAB — COMPREHENSIVE METABOLIC PANEL
ALT: 17 U/L (ref 0–35)
AST: 26 U/L (ref 0–37)
Albumin: 3.6 g/dL (ref 3.5–5.2)
Alkaline Phosphatase: 116 U/L (ref 39–117)
BUN: 11 mg/dL (ref 6–23)
CO2: 25 mEq/L (ref 19–32)
Calcium: 9.1 mg/dL (ref 8.4–10.5)
Chloride: 111 mEq/L (ref 96–112)
Creatinine, Ser: 0.38 mg/dL — ABNORMAL LOW (ref 0.40–1.20)
GFR: 115.91 mL/min (ref >60.00)
Glucose, Bld: 94 mg/dL (ref 70–99)
Potassium: 3.9 mEq/L (ref 3.5–5.1)
Sodium: 141 mEq/L (ref 135–145)
Total Bilirubin: 1.2 mg/dL (ref 0.2–1.2)
Total Protein: 6.4 g/dL (ref 6.0–8.3)

## 2021-10-20 LAB — PROTIME-INR
INR: 1.3 ratio — ABNORMAL HIGH (ref 0.8–1.0)
Prothrombin Time: 14.5 s — ABNORMAL HIGH (ref 9.6–13.1)

## 2021-10-20 MED ORDER — OMEPRAZOLE 20 MG PO CPDR: 20.0000 mg | DELAYED_RELEASE_CAPSULE | Freq: Every day | ORAL | 3 refills | Status: DC

## 2021-10-20 NOTE — Progress Notes (Signed)
HPI: This is a very pleasant 50 year old woman who was referred to me by Dr. Bonna Gains and Dr Harlow Asa to evaluate distal gastric polyp.    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  She gets upper endoscopies periodically by her primary gastroenterology team in [place].  She has had esophageal varices banded numerous times.  On her most recent upper endoscopy she was found to have a polypoid lesion in her distal stomach.  This was biopsies and the biopsies showed it to be an "acutely inflamed hyperplastic polyp".  The gastroenterologist measured this to be about 1.5 cm.  She was referred here to get my opinion about removing the polyp.  The last blood work I can find in our system is from July 2022, her white count was 1.8, hemoglobin was 12.2105 AGA guidelines on incidental pancreatic cysts. These differ from the radiology 'white paper' that is referenced in the MRI impression.  The AGA guidelines lists three concerning criteria (lesion >3cm, abnormal associated main PD, and enhancing (solid) components to the lesion) and note that 2 or 3 of these criteria need to be met to warrant EUS or surgery.  If zero or 1 criteria is met then surveillance MR in 1 year is reasonable,  Her platelets were 36,000.  Her alpha-fetoprotein was 1.4, her creatinine was 0.4, her LFTs were all completely normal.  I do not see coags checked in about 2 years.  She just had blood work done prior to this visit (CBC, complete metabolic profile and coags). None of those results are back yet.  Most recent imaging of her liver was by ultrasound May 2022 and this showed "cirrhotic liver.  Portal hypertension with recanalized paraumbilical veins"  She was referred by her primary gastroenterologist in [place] Dr. Bonna Gains as well as [facility] surgery Dr. Harlow Asa (whom her GI doc sent her to).  I think there was some miscommunication about that  referral actually.  The patient and the gastroenterologist thought that he was going to be addressing her large external bleeding hemorrhoids however he felt he was being asked to address her distal gastric polyp.  She does not have overt GI bleeding.  She did spit up some blood many years ago and this was attributed to her esophageal varices.  She is on oral iron therapy.  She has not been on a proton pump inhibitor or any antiacid medicines  She tells me she gets a colonoscopy about every 3 to 5 years because of family history of colon cancer   Review of systems: Pertinent positive and negative review of systems were noted in the above HPI section. All other review negative.   Past Medical History:  Diagnosis Date   Arthritis    Back pain    Cirrhosis ([place])    Gout    Hypothyroidism    IDA (iron deficiency anemia) 01/16/2020   MI, old    Thyroid disease     Past Surgical History:  Procedure Laterality Date   CARDIAC CATHETERIZATION     CESAREAN SECTION     COLONOSCOPY WITH PROPOFOL N/A 03/16/2018   Procedure: COLONOSCOPY WITH PROPOFOL;  Surgeon: Virgel Manifold, MD;  Location: [facility] ENDOSCOPY;  Service: Endoscopy;  Laterality: N/A;   COLONOSCOPY WITH PROPOFOL N/A 07/09/2021   Procedure: COLONOSCOPY WITH PROPOFOL;  Surgeon: Virgel Manifold, MD;  Location: [facility] ENDOSCOPY;  Service: Endoscopy;  Laterality: N/A;   ESOPHAGOGASTRODUODENOSCOPY N/A 10/11/2019   Procedure: ESOPHAGOGASTRODUODENOSCOPY (  EGD);  Surgeon: Lin Landsman, MD;  Location: [facility] ENDOSCOPY;  Service: Gastroenterology;  Laterality: N/A;   ESOPHAGOGASTRODUODENOSCOPY (EGD) WITH PROPOFOL N/A 10/27/2018   Procedure: ESOPHAGOGASTRODUODENOSCOPY (EGD) WITH PROPOFOL;  Surgeon: Virgel Manifold, MD;  Location: [facility] ENDOSCOPY;  Service: Endoscopy;  Laterality: N/A;   ESOPHAGOGASTRODUODENOSCOPY (EGD) WITH PROPOFOL N/A 06/25/2019   Procedure: ESOPHAGOGASTRODUODENOSCOPY (EGD) WITH PROPOFOL;  Surgeon: Jonathon Bellows,  MD;  Location: [facility] ENDOSCOPY;  Service: Gastroenterology;  Laterality: N/A;   ESOPHAGOGASTRODUODENOSCOPY (EGD) WITH PROPOFOL N/A 09/12/2019   Procedure: ESOPHAGOGASTRODUODENOSCOPY (EGD) WITH PROPOFOL;  Surgeon: Lucilla Lame, MD;  Location: [facility] ENDOSCOPY;  Service: Endoscopy;  Laterality: N/A;   ESOPHAGOGASTRODUODENOSCOPY (EGD) WITH PROPOFOL N/A 11/20/2019   Procedure: ESOPHAGOGASTRODUODENOSCOPY (EGD) WITH PROPOFOL;  Surgeon: Virgel Manifold, MD;  Location: [facility] ENDOSCOPY;  Service: Endoscopy;  Laterality: N/A;   ESOPHAGOGASTRODUODENOSCOPY (EGD) WITH PROPOFOL N/A 11/23/2019   Procedure: ESOPHAGOGASTRODUODENOSCOPY (EGD) WITH PROPOFOL;  Surgeon: Virgel Manifold, MD;  Location: [facility] ENDOSCOPY;  Service: Endoscopy;  Laterality: N/A;   ESOPHAGOGASTRODUODENOSCOPY (EGD) WITH PROPOFOL N/A 01/03/2020   Procedure: ESOPHAGOGASTRODUODENOSCOPY (EGD) WITH PROPOFOL;  Surgeon: Lin Landsman, MD;  Location: [place];  Service: Endoscopy;  Laterality: N/A;   ESOPHAGOGASTRODUODENOSCOPY (EGD) WITH PROPOFOL N/A 07/09/2021   Procedure: ESOPHAGOGASTRODUODENOSCOPY (EGD) WITH PROPOFOL;  Surgeon: Virgel Manifold, MD;  Location: [facility] ENDOSCOPY;  Service: Endoscopy;  Laterality: N/A;   TUBAL LIGATION      Current Outpatient Medications  Medication Sig Dispense Refill   Biotin 10 MG CAPS Take 1 capsule by mouth daily.     cyanocobalamin ([employer] VITAMIN B12) 1000 MCG tablet Take 1 tablet (1,000 mcg total) by mouth daily. 90 tablet 1   ferrous sulfate 325 (65 FE) MG EC tablet TAKE 1 TABLET BY MOUTH EVERY DAY WITH BREAKFAST 30 tablet 2   lactulose (CHRONULAC) 10 GM/15ML solution TAKE 15 MLS (10 G TOTAL) BY MOUTH DAILY FOR 30 DAYS. 1892 mL 0   levothyroxine (SYNTHROID) 150 MCG tablet TAKE 1 TABLET BY MOUTH DAILY, 30 MINUTES BEFORE A MEAL 30 tablet 6   nitroGLYCERIN (NITROLINGUAL) 0.4 MG/SPRAY spray Place 1 spray under the tongue every 5 (five) minutes x 3 doses as needed for chest pain. 12 g 2   Oxycodone  HCl 10 MG TABS Take 10 mg by mouth 4 (four) times daily as needed.     No current facility-administered medications for this visit.    Allergies as of 10/20/2021 - Review Complete 10/20/2021  Allergen Reaction Noted   Vicodin [hydrocodone-acetaminophen] Rash 01/11/2016   Amoxicillin Rash and Other (See Comments) 01/11/2016   Gabapentin Rash 01/11/2016    Family History  Problem Relation Age of Onset   Hypertension Other    CAD Father    Colon cancer Father    Lung cancer Father    Pancreatic cancer Brother     Social History   Socioeconomic History   Marital status: Single    Spouse name: Not on file   Number of children: Not on file   Years of education: Not on file   Highest education level: Not on file  Occupational History   Not on file  Tobacco Use   Smoking status: Never   Smokeless tobacco: Never  Vaping Use   Vaping Use: Never used  Substance and Sexual Activity   Alcohol use: Yes    Comment: rarely   Drug use: Never   Sexual activity: Not Currently  Other Topics Concern   Not on file  Social History Narrative   Not  on file   Social Determinants of Health   Financial Resource Strain: Not on file  Food Insecurity: Not on file  Transportation Needs: Not on file  Physical Activity: Not on file  Stress: Not on file  Social Connections: Not on file  Intimate Partner Violence: Not on file     Physical Exam: BP 124/80   Pulse 84   Ht _0  (1.448 m)   Wt 194 lb (88 kg)   SpO2 98%   BMI 41.98 kg/m  Constitutional: generally well-appearing except for morbid obesity Psychiatric: alert and oriented x3 Eyes: extraocular movements intact Mouth: oral pharynx moist, no lesions Neck: supple no lymphadenopathy Cardiovascular: heart regular rate and rhythm Lungs: clear to auscultation bilaterally Abdomen: soft, nontender, nondistended, no obvious ascites, no peritoneal signs, normal bowel sounds Extremities: no lower extremity edema bilaterally Skin:  no lesions on visible extremities   Assessment and plan: 50 y.o. female with an inflamed distal gastric hyperplastic polyp, cirrhosis, portal hypertension, morbid obesity  She has cirrhosis, portal hypertension, esophageal varices, platelets generally around 30-40K  She has not had INR checked in 2 years but that was sent just prior to her appointment today.  Also pending our CBC and complete metabolic profile.    I explained to her that hyperplastic polyps only rarely transform into anything such as a gastric cancer.  I think the risks of removing this gastric polyp exceed the potential benefits.  I recommend instead that her primary gastroenterology team simply observe this region of her stomach when they would normally be performing upper endoscopy for her known esophageal varices.  If there is a high suspicion that it is causing problems such as bleeding or transforming into something more ominous then at that point the benefits of removing it may outweigh the risks of removing it.  It would be a high risk procedure and I think she would probably be best suited to be seen at a tertiary gastroenterology center if that ever becomes necessary.  She is not on any antiacid medicines and given the acute inflammation component of this hyperplastic polyp I certainly think it is reasonable to start on proton pump inhibitor 20 mg omeprazole 1 pill once daily.  This might indeed resolve some of the acute inflammation and it would not be surprising if the hyperplastic polyp is smaller on subsequent EGDs by her primary gastroenterologist   Please see the "Patient Instructions" section for addition details about the plan.   Owens Loffler, MD [facility] 10/20/2021, 2:44 PM  Cc: Cletis Athens, MD  Total time on date of encounter was 45 minutes (this included time spent preparing to see the patient reviewing records; obtaining and/or reviewing separately obtained history; performing a  medically appropriate exam and/or evaluation; counseling and educating the patient and family if present; ordering medications, tests or procedures if applicable; and documenting clinical information in the health record).

## 2021-10-20 NOTE — Patient Instructions (Signed)
If you are age 51 or younger, your body mass index should be between 19-25. Your Body mass index is 41.98 kg/m. If this is out of the aformentioned range listed, please consider follow up with your Primary Care Provider.  ________________________________________________________  The [facility] providers would like to encourage you to use [facility] to communicate with providers for non-urgent requests or questions.  Due to long hold times on the telephone, sending your provider a message by [facility] may be a faster and more efficient way to get a response.  Please allow 48 business hours for a response.  Please remember that this is for non-urgent requests.  _______________________________________________________  We have sent the following medications to your pharmacy for you to pick up at your convenience:  START: Omeprazole 20mg  one capsule 20 to 30 minutes prior to breakfast meal each day.  Thank you for entrusting me with your care and choosing [facility].  Dr Ardis Hughs

## 2021-10-22 ENCOUNTER — Ambulatory Visit
Admission: RE | Admit: 2021-10-22 | Discharge: 2021-10-22 | Disposition: A | Discharge status: Home / Self Care | Payer: Medicaid Other | Source: Ambulatory Visit | Attending: Gastroenterology | Admitting: Gastroenterology

## 2021-10-22 DIAGNOSIS — K7460 Unspecified cirrhosis of liver: Secondary | ICD-10-CM | POA: Insufficient documentation

## 2021-11-17 DIAGNOSIS — K641 Second degree hemorrhoids: Secondary | ICD-10-CM | POA: Diagnosis not present

## 2021-11-28 ENCOUNTER — Ambulatory Visit: Payer: Medicaid Other | Admitting: Internal Medicine

## 2021-12-17 ENCOUNTER — Ambulatory Visit: Payer: Medicaid Other | Admitting: Internal Medicine

## 2021-12-25 ENCOUNTER — Other Ambulatory Visit: Payer: Self-pay | Admitting: *Deleted

## 2021-12-25 DIAGNOSIS — D500 Iron deficiency anemia secondary to blood loss (chronic): Secondary | ICD-10-CM

## 2021-12-25 DIAGNOSIS — D696 Thrombocytopenia, unspecified: Secondary | ICD-10-CM

## 2021-12-31 ENCOUNTER — Inpatient Hospital Stay: Payer: Medicaid Other | Attending: Oncology

## 2021-12-31 DIAGNOSIS — K317 Polyp of stomach and duodenum: Secondary | ICD-10-CM | POA: Insufficient documentation

## 2021-12-31 DIAGNOSIS — K7460 Unspecified cirrhosis of liver: Secondary | ICD-10-CM | POA: Insufficient documentation

## 2021-12-31 DIAGNOSIS — D696 Thrombocytopenia, unspecified: Secondary | ICD-10-CM | POA: Insufficient documentation

## 2021-12-31 DIAGNOSIS — D500 Iron deficiency anemia secondary to blood loss (chronic): Secondary | ICD-10-CM | POA: Insufficient documentation

## 2021-12-31 DIAGNOSIS — K648 Other hemorrhoids: Secondary | ICD-10-CM | POA: Insufficient documentation

## 2021-12-31 DIAGNOSIS — K625 Hemorrhage of anus and rectum: Secondary | ICD-10-CM | POA: Insufficient documentation

## 2021-12-31 DIAGNOSIS — R161 Splenomegaly, not elsewhere classified: Secondary | ICD-10-CM | POA: Insufficient documentation

## 2021-12-31 DIAGNOSIS — E538 Deficiency of other specified B group vitamins: Secondary | ICD-10-CM | POA: Insufficient documentation

## 2022-01-01 ENCOUNTER — Inpatient Hospital Stay: Payer: Medicaid Other | Admitting: Oncology

## 2022-01-01 DIAGNOSIS — Z79891 Long term (current) use of opiate analgesic: Secondary | ICD-10-CM | POA: Diagnosis not present

## 2022-01-01 DIAGNOSIS — Z79899 Other long term (current) drug therapy: Secondary | ICD-10-CM | POA: Diagnosis not present

## 2022-01-02 ENCOUNTER — Ambulatory Visit: Payer: Medicaid Other | Admitting: Oncology

## 2022-01-12 ENCOUNTER — Inpatient Hospital Stay: Payer: Medicaid Other

## 2022-01-12 ENCOUNTER — Other Ambulatory Visit: Payer: Self-pay

## 2022-01-12 DIAGNOSIS — K648 Other hemorrhoids: Secondary | ICD-10-CM | POA: Diagnosis not present

## 2022-01-12 DIAGNOSIS — K317 Polyp of stomach and duodenum: Secondary | ICD-10-CM | POA: Diagnosis not present

## 2022-01-12 DIAGNOSIS — D696 Thrombocytopenia, unspecified: Secondary | ICD-10-CM

## 2022-01-12 DIAGNOSIS — K625 Hemorrhage of anus and rectum: Secondary | ICD-10-CM | POA: Diagnosis not present

## 2022-01-12 DIAGNOSIS — D500 Iron deficiency anemia secondary to blood loss (chronic): Secondary | ICD-10-CM | POA: Diagnosis not present

## 2022-01-12 DIAGNOSIS — R161 Splenomegaly, not elsewhere classified: Secondary | ICD-10-CM | POA: Diagnosis not present

## 2022-01-12 DIAGNOSIS — E538 Deficiency of other specified B group vitamins: Secondary | ICD-10-CM | POA: Diagnosis not present

## 2022-01-12 DIAGNOSIS — K7460 Unspecified cirrhosis of liver: Secondary | ICD-10-CM | POA: Diagnosis not present

## 2022-01-12 LAB — CBC WITH DIFFERENTIAL/PLATELET
Abs Immature Granulocytes: 0 10*3/uL (ref 0.00–0.07)
Basophils Absolute: 0 10*3/uL (ref 0.0–0.1)
Basophils Relative: 1 %
Eosinophils Absolute: 0.1 10*3/uL (ref 0.0–0.5)
Eosinophils Relative: 3 %
HCT: 33.8 % — ABNORMAL LOW (ref 36.0–46.0)
Hemoglobin: 11.7 g/dL — ABNORMAL LOW (ref 12.0–15.0)
Immature Granulocytes: 0 %
Lymphocytes Relative: 35 %
Lymphs Abs: 0.7 10*3/uL (ref 0.7–4.0)
MCH: 30 pg (ref 26.0–34.0)
MCHC: 34.6 g/dL (ref 30.0–36.0)
MCV: 86.7 fL (ref 80.0–100.0)
Monocytes Absolute: 0.2 10*3/uL (ref 0.1–1.0)
Monocytes Relative: 9 %
Neutro Abs: 1.1 10*3/uL — ABNORMAL LOW (ref 1.7–7.7)
Neutrophils Relative %: 52 %
Platelets: 39 10*3/uL — ABNORMAL LOW (ref 150–400)
RBC: 3.9 MIL/uL (ref 3.87–5.11)
RDW: 12.6 % (ref 11.5–15.5)
WBC: 2 10*3/uL — ABNORMAL LOW (ref 4.0–10.5)
nRBC: 0 % (ref 0.0–0.2)

## 2022-01-13 ENCOUNTER — Ambulatory Visit
Admission: RE | Admit: 2022-01-13 | Discharge: 2022-01-13 | Disposition: A | Discharge status: Home / Self Care | Payer: Medicaid Other | Source: Ambulatory Visit | Attending: Internal Medicine | Admitting: Internal Medicine

## 2022-01-13 ENCOUNTER — Ambulatory Visit (INDEPENDENT_AMBULATORY_CARE_PROVIDER_SITE_OTHER): Payer: Medicaid Other | Admitting: Internal Medicine

## 2022-01-13 ENCOUNTER — Ambulatory Visit
Admission: RE | Admit: 2022-01-13 | Discharge: 2022-01-13 | Disposition: A | Discharge status: Home / Self Care | Payer: Medicaid Other | Attending: Internal Medicine | Admitting: Internal Medicine

## 2022-01-13 ENCOUNTER — Inpatient Hospital Stay: Payer: Medicaid Other | Admitting: Oncology

## 2022-01-13 ENCOUNTER — Encounter: Payer: Self-pay | Admitting: Internal Medicine

## 2022-01-13 ENCOUNTER — Encounter: Payer: Self-pay | Admitting: Oncology

## 2022-01-13 VITALS — BP 142/92 | HR 87 | Temp 98.7°F | Resp 20 | Wt 200.5 lb

## 2022-01-13 VITALS — BP 132/90 | HR 71 | Ht <= 58 in | Wt 199.0 lb

## 2022-01-13 DIAGNOSIS — R091 Pleurisy: Secondary | ICD-10-CM | POA: Insufficient documentation

## 2022-01-13 DIAGNOSIS — D696 Thrombocytopenia, unspecified: Secondary | ICD-10-CM

## 2022-01-13 DIAGNOSIS — D500 Iron deficiency anemia secondary to blood loss (chronic): Secondary | ICD-10-CM | POA: Diagnosis not present

## 2022-01-13 DIAGNOSIS — K625 Hemorrhage of anus and rectum: Secondary | ICD-10-CM | POA: Diagnosis not present

## 2022-01-13 DIAGNOSIS — M47814 Spondylosis without myelopathy or radiculopathy, thoracic region: Secondary | ICD-10-CM | POA: Diagnosis not present

## 2022-01-13 DIAGNOSIS — K7469 Other cirrhosis of liver: Secondary | ICD-10-CM

## 2022-01-13 DIAGNOSIS — K648 Other hemorrhoids: Secondary | ICD-10-CM | POA: Diagnosis not present

## 2022-01-13 DIAGNOSIS — Z1231 Encounter for screening mammogram for malignant neoplasm of breast: Secondary | ICD-10-CM | POA: Diagnosis not present

## 2022-01-13 DIAGNOSIS — R079 Chest pain, unspecified: Secondary | ICD-10-CM | POA: Diagnosis not present

## 2022-01-13 DIAGNOSIS — R161 Splenomegaly, not elsewhere classified: Secondary | ICD-10-CM | POA: Diagnosis not present

## 2022-01-13 DIAGNOSIS — I8500 Esophageal varices without bleeding: Secondary | ICD-10-CM | POA: Diagnosis not present

## 2022-01-13 DIAGNOSIS — R0781 Pleurodynia: Secondary | ICD-10-CM | POA: Diagnosis not present

## 2022-01-13 DIAGNOSIS — N644 Mastodynia: Secondary | ICD-10-CM | POA: Diagnosis not present

## 2022-01-13 DIAGNOSIS — K317 Polyp of stomach and duodenum: Secondary | ICD-10-CM | POA: Diagnosis not present

## 2022-01-13 DIAGNOSIS — I25118 Atherosclerotic heart disease of native coronary artery with other forms of angina pectoris: Secondary | ICD-10-CM | POA: Diagnosis not present

## 2022-01-13 DIAGNOSIS — K7460 Unspecified cirrhosis of liver: Secondary | ICD-10-CM | POA: Diagnosis not present

## 2022-01-13 DIAGNOSIS — E538 Deficiency of other specified B group vitamins: Secondary | ICD-10-CM | POA: Diagnosis not present

## 2022-01-13 HISTORY — DX: Pleurisy: R09.1

## 2022-01-13 LAB — AFP TUMOR MARKER: AFP, Serum, Tumor Marker: <1.8 ng/mL (ref 0.0–9.2)

## 2022-01-13 NOTE — Assessment & Plan Note (Signed)
Chest pain in the lt side,  Chest x-ray #2 no rash was noted

## 2022-01-13 NOTE — Assessment & Plan Note (Signed)
Stable at the present time. 

## 2022-01-13 NOTE — Assessment & Plan Note (Signed)
Patient denies any history of angina

## 2022-01-13 NOTE — Assessment & Plan Note (Signed)
Patient is under the care of GI specialist

## 2022-01-13 NOTE — Assessment & Plan Note (Addendum)
Platelet count is 39 thousand

## 2022-01-13 NOTE — Progress Notes (Signed)
[facility] day:  01/13/2022  Chief Complaint: Brandi Bright is a 51 y.o. female follows up for management of iron deficiency anemia, thrombocytopenia, and leukopenia   PERTINENT HEMATOLOGY HISTORY Patient follows up with Dr. Mike Gip previously.  Establish care with me on 02/09/2019. Reviewed patient's previous medical records, labs, imaging results. # History of cirrhosis and hepatitis C.  She has a history of chronic anemia, thrombocytopenia and leukopenia dating back to 03/2012.  Platelet count has fluctuated between 54,000 - 56,000 since 12/28/2017 (previously 73,000 - 134,000).  Ferritin was 14 on 08/31/2018.    Work-up on 09/23/2018 revealed a hematocrit of 31.0, hemoglobin 10.1, MCV 87.6, platelets 46,000, WBC 1900 with an ANC of 1100.  Ferritin was 10 (low) with iron saturation 16% with a TIBC of 401.  Retic was 1.2%.  B12 was 315.  Normal studies included: folate, SPEP, and free light chain ratio.  Copper was 69 (72-166).  ANA was + with double stranded DNA antibody 13 (0-9).  TSH was 0.036 (0.35-4.5) with a free T4 1.05 (0.82-1.77).  Peripheral smear revealed variant lymphocytes.  Ferritin has been followed:  14 on 08/31/2018 and 10 on 09/23/2018.  Abdomen and pelvic CT on 07/14/2018 revealed cirrhosis with portal hypertension noted by prominent splenomegaly (18.5 x 13.8 x 8.1 cm; volume 1100 cm3), enlarged portal veins with recannulated umbilical vein, paraesophageal and perigastric varices.  There was mild thickening of the cecum and ascending colon.  EGD on 10/27/2018 revealed grade II esophageal varices.  There was erythematous mucosa in the antrum.  There was congested, erythematous, friable (with contact bleeding), granular and nodular mucosa in the gastric fundus and astric body.  There was a single gastric polyp (polypoid ulcerated antral type mucos with chronic active mucosal inflammation).  There was a normal duodenal bulb, second  portion of the duodenum and examined duodenum.  The patient's iron deficiency could be explained by her friable gastric mucosa (likely from portal hypertension).  There was no history of variceal bleeding and thus this is not a cause of her iron deficiency.  # admitted from 10/11/2019 to 10/12/2019 due to rectal bleeding EGD 10/12/2019 showed portal hypertensive gastropathy, treated with APC.  Nonbleeding large esophageal varices incompletely evaluated.  Banded. Patient follows with gastroenterology and had EGD on 11/20/2019. Banding was not done due to large amount of food in the stomach. There was plan for EGD on 11/23/2019.  Her gastroenterologist Dr. Bonna Gains contacted me to see if patient can get platelet transfusion to improve her platelet counts for banding on 11/23/2019.   INTERVAL HISTORY Brandi Bright is a 51 year old female who is followed Dr. Tasia Catchings for thrombocytopenia, splenomegaly, leukopenia and anemia.  In the interim she was seen by GI for cirrhosis.  Had recent EGD and colonoscopy in July 2022 which showed grade 1 esophageal varices with no bleeding and several gastric polyps.  Duodenum was normal.  Had hemorrhoids and several cecum, transverse colon and rectal polyps.  She had an ultrasound of her liver and gallbladder on 10/22/2021 which showed stable cirrhosis with no acute findings.  She was seen by her PCP this morning for breast pain on the left side.  A chest x-ray was ordered for evaluation and she will have this completed after our visit today.  Reports possibly injuring herself after pulling a drawer from her refrigerator on Sunday.  States it is gradually worsened since then.  Denies an actual mass or lump in her breasts more of a  pulling sensation.  States she is unable to lift the arm above shoulder height.  PCP thought it was either a musculoskeletal sprain or developing shingles.  Reports occasional rectal bleeding when she strains to have a bowel movement.  Has to wear a pad.   Reports seeing a hemorrhoid specialist in [place] who stated hemorrhoids were too small for surgery.  Reports bleeding has improved some although it happens several times per week.  Reports some constipation due to iron supplements.   Review of Systems  Constitutional:  Positive for fatigue. Negative for appetite change, fever and unexpected weight change.  HENT:   Negative for nosebleeds, sore throat and trouble swallowing.   Eyes: Negative.   Respiratory: Negative.  Negative for cough, shortness of breath and wheezing.   Cardiovascular: Negative.  Negative for chest pain and leg swelling.  Gastrointestinal:  Positive for blood in stool and constipation. Negative for abdominal pain, diarrhea, nausea and vomiting.  Endocrine: Negative.   Genitourinary: Negative.  Negative for bladder incontinence, hematuria and nocturia.   Musculoskeletal:  Positive for myalgias. Negative for back pain and flank pain.       Left sided breast/ rib pain  Skin: Negative.   Neurological: Negative.  Negative for dizziness, headaches, light-headedness and numbness.  Hematological: Negative.   Psychiatric/Behavioral: Negative.  Negative for confusion. The patient is not nervous/anxious.   Past Medical History:  Diagnosis Date   Arthritis    Back pain    Cirrhosis ([place])    Gout    Hypothyroidism    IDA (iron deficiency anemia) 01/16/2020   MI, old    Thyroid disease     Past Surgical History:  Procedure Laterality Date   CARDIAC CATHETERIZATION     CESAREAN SECTION     COLONOSCOPY WITH PROPOFOL N/A 03/16/2018   Procedure: COLONOSCOPY WITH PROPOFOL;  Surgeon: Virgel Manifold, MD;  Location: [facility] ENDOSCOPY;  Service: Endoscopy;  Laterality: N/A;   COLONOSCOPY WITH PROPOFOL N/A 07/09/2021   Procedure: COLONOSCOPY WITH PROPOFOL;  Surgeon: Virgel Manifold, MD;  Location: [facility] ENDOSCOPY;  Service: Endoscopy;  Laterality: N/A;   ESOPHAGOGASTRODUODENOSCOPY N/A 10/11/2019   Procedure:  ESOPHAGOGASTRODUODENOSCOPY (EGD);  Surgeon: Lin Landsman, MD;  Location: [facility] ENDOSCOPY;  Service: Gastroenterology;  Laterality: N/A;   ESOPHAGOGASTRODUODENOSCOPY (EGD) WITH PROPOFOL N/A 10/27/2018   Procedure: ESOPHAGOGASTRODUODENOSCOPY (EGD) WITH PROPOFOL;  Surgeon: Virgel Manifold, MD;  Location: [facility] ENDOSCOPY;  Service: Endoscopy;  Laterality: N/A;   ESOPHAGOGASTRODUODENOSCOPY (EGD) WITH PROPOFOL N/A 06/25/2019   Procedure: ESOPHAGOGASTRODUODENOSCOPY (EGD) WITH PROPOFOL;  Surgeon: Jonathon Bellows, MD;  Location: [facility] ENDOSCOPY;  Service: Gastroenterology;  Laterality: N/A;   ESOPHAGOGASTRODUODENOSCOPY (EGD) WITH PROPOFOL N/A 09/12/2019   Procedure: ESOPHAGOGASTRODUODENOSCOPY (EGD) WITH PROPOFOL;  Surgeon: Lucilla Lame, MD;  Location: [facility] ENDOSCOPY;  Service: Endoscopy;  Laterality: N/A;   ESOPHAGOGASTRODUODENOSCOPY (EGD) WITH PROPOFOL N/A 11/20/2019   Procedure: ESOPHAGOGASTRODUODENOSCOPY (EGD) WITH PROPOFOL;  Surgeon: Virgel Manifold, MD;  Location: [facility] ENDOSCOPY;  Service: Endoscopy;  Laterality: N/A;   ESOPHAGOGASTRODUODENOSCOPY (EGD) WITH PROPOFOL N/A 11/23/2019   Procedure: ESOPHAGOGASTRODUODENOSCOPY (EGD) WITH PROPOFOL;  Surgeon: Virgel Manifold, MD;  Location: [facility] ENDOSCOPY;  Service: Endoscopy;  Laterality: N/A;   ESOPHAGOGASTRODUODENOSCOPY (EGD) WITH PROPOFOL N/A 01/03/2020   Procedure: ESOPHAGOGASTRODUODENOSCOPY (EGD) WITH PROPOFOL;  Surgeon: Lin Landsman, MD;  Location: [place];  Service: Endoscopy;  Laterality: N/A;   ESOPHAGOGASTRODUODENOSCOPY (EGD) WITH PROPOFOL N/A 07/09/2021   Procedure: ESOPHAGOGASTRODUODENOSCOPY (EGD) WITH PROPOFOL;  Surgeon: Virgel Manifold, MD;  Location: [facility] ENDOSCOPY;  Service: Endoscopy;  Laterality: N/A;   TUBAL LIGATION      Family History  Problem Relation Age of Onset   Hypertension Other    CAD Father    Colon cancer Father    Lung cancer Father    Pancreatic cancer Brother    Social History    Socioeconomic History   Marital status: Single    Spouse name: Not on file   Number of children: Not on file   Years of education: Not on file   Highest education level: Not on file  Occupational History   Not on file  Tobacco Use   Smoking status: Never   Smokeless tobacco: Never  Vaping Use   Vaping Use: Never used  Substance and Sexual Activity   Alcohol use: Yes    Comment: rarely   Drug use: Never   Sexual activity: Not Currently  Other Topics Concern   Not on file  Social History Narrative   Not on file   Social Determinants of Health   Financial Resource Strain: Not on file  Food Insecurity: Not on file  Transportation Needs: Not on file  Physical Activity: Not on file  Stress: Not on file  Social Connections: Not on file  Intimate Partner Violence: Not on file    Allergies:  Allergies  Allergen Reactions   Vicodin [Hydrocodone-Acetaminophen] Rash   Amoxicillin Rash and Other (See Comments)    Has patient had a PCN reaction causing immediate rash, facial/tongue/throat swelling, SOB or lightheadedness with hypotension: Yes Has patient had a PCN reaction causing severe rash involving mucus membranes or skin necrosis: No Has patient had a PCN reaction that required hospitalization: No Has patient had a PCN reaction occurring within the last 10 years: No If all of the above answers are "NO", then may proceed with Cephalosporin use.    Gabapentin Rash    Current Medications: Current Outpatient Medications  Medication Sig Dispense Refill   Biotin 10 MG CAPS Take 1 capsule by mouth daily.     cyanocobalamin ([employer] VITAMIN B12) 1000 MCG tablet Take 1 tablet (1,000 mcg total) by mouth daily. 90 tablet 1   ferrous sulfate 325 (65 FE) MG EC tablet TAKE 1 TABLET BY MOUTH EVERY DAY WITH BREAKFAST 30 tablet 2   lactulose (CHRONULAC) 10 GM/15ML solution TAKE 15 MLS (10 G TOTAL) BY MOUTH DAILY FOR 30 DAYS. 1892 mL 0   levothyroxine (SYNTHROID) 150 MCG tablet TAKE 1  TABLET BY MOUTH DAILY, 30 MINUTES BEFORE A MEAL 30 tablet 6   nitroGLYCERIN (NITROLINGUAL) 0.4 MG/SPRAY spray Place 1 spray under the tongue every 5 (five) minutes x 3 doses as needed for chest pain. 12 g 2   omeprazole (PRILOSEC) 20 MG capsule Take 1 capsule (20 mg total) by mouth daily. 90 capsule 3   Oxycodone HCl 10 MG TABS Take 10 mg by mouth 4 (four) times daily as needed.     No current facility-administered medications for this visit.     Physical Exam: There were no vitals taken for this visit.   Physical Exam Constitutional:      Appearance: Normal appearance.  HENT:     Head: Normocephalic and atraumatic.  Eyes:     Pupils: Pupils are equal, round, and reactive to light.  Cardiovascular:     Rate and Rhythm: Normal rate and regular rhythm.     Heart sounds: Normal heart sounds. No murmur heard. Pulmonary:     Effort: Pulmonary effort is normal.     Breath  sounds: Normal breath sounds. No wheezing.  Abdominal:     General: Bowel sounds are normal. There is no distension.     Palpations: Abdomen is soft.     Tenderness: There is no abdominal tenderness.  Musculoskeletal:        General: Normal range of motion.     Cervical back: Normal range of motion.  Skin:    General: Skin is warm and dry.     Findings: No rash.  Neurological:     Mental Status: She is alert and oriented to person, place, and time.     Gait: Gait is intact.  Psychiatric:        Mood and Affect: Mood and affect normal.        Cognition and Memory: Memory normal.        Judgment: Judgment normal.    Appointment on 01/12/2022  Component Date Value Ref Range Status   AFP, Serum, Tumor Marker 01/12/2022 <1.8  0.0 - 9.2 ng/mL Final   Comment: (NOTE) [employer] Electrochemiluminescence Immunoassay (ECLIA) Values obtained with different assay methods or kits cannot be used interchangeably.  Results cannot be interpreted as absolute evidence of the presence or absence of malignant  disease. This test is not interpretable in pregnant females. Performed At: [facility] [address] [place], [place] [id number] Rush Farmer MD [id number]    WBC 01/12/2022 2.0 (L)  4.0 - 10.5 K/uL Final   RBC 01/12/2022 3.90  3.87 - 5.11 MIL/uL Final   Hemoglobin 01/12/2022 11.7 (L)  12.0 - 15.0 g/dL Final   HCT 01/12/2022 33.8 (L)  36.0 - 46.0 % Final   MCV 01/12/2022 86.7  80.0 - 100.0 fL Final   MCH 01/12/2022 30.0  26.0 - 34.0 pg Final   MCHC 01/12/2022 34.6  30.0 - 36.0 g/dL Final   RDW 01/12/2022 12.6  11.5 - 15.5 % Final   Platelets 01/12/2022 39 (L)  150 - 400 K/uL Final   Comment: Immature Platelet Fraction may be clinically indicated, consider ordering this additional test [id number]    nRBC 01/12/2022 0.0  0.0 - 0.2 % Final   Neutrophils Relative % 01/12/2022 52  % Final   Neutro Abs 01/12/2022 1.1 (L)  1.7 - 7.7 K/uL Final   Lymphocytes Relative 01/12/2022 35  % Final   Lymphs Abs 01/12/2022 0.7  0.7 - 4.0 K/uL Final   Monocytes Relative 01/12/2022 9  % Final   Monocytes Absolute 01/12/2022 0.2  0.1 - 1.0 K/uL Final   Eosinophils Relative 01/12/2022 3  % Final   Eosinophils Absolute 01/12/2022 0.1  0.0 - 0.5 K/uL Final   Basophils Relative 01/12/2022 1  % Final   Basophils Absolute 01/12/2022 0.0  0.0 - 0.1 K/uL Final   Immature Granulocytes 01/12/2022 0  % Final   Abs Immature Granulocytes 01/12/2022 0.00  0.00 - 0.07 K/uL Final   Performed at [facility], [address] [place], [place] [address]    RADIOGRAPHIC STUDIES: I have personally reviewed the radiological images as listed and agreed with the findings in the report. US ABDOMEN LIMITED RUQ (LIVER/GB)  Result Date: 10/23/2021 CLINICAL DATA:  Cirrhosis without evidence of ascites. EXAM: ULTRASOUND ABDOMEN LIMITED RIGHT UPPER QUADRANT COMPARISON:  05/01/2021 FINDINGS: Gallbladder: No evidence of gallstones or gallbladder wall thickening. Negative sonographic Murphy sign. No adjacent  free fluid. Common bile duct: Diameter: 4 mm. Liver: Nodular contour with slight increased parenchymal echogenicity compatible with a degree of steatosis/cirrhosis. No focal liver mass.  Portal vein is patent on color Doppler imaging with normal direction of blood flow towards the liver. Evidence of recanalized paraumbilical veins. Other: No evidence of ascites. IMPRESSION: 1.  No acute findings. 2.  Stable morphologic changes of cirrhosis.  No focal mass. Electronically Signed   By: Marin Olp M.D.   On: 10/23/2021 14:44    Assessment:  Brandi Bright is a 51 y.o. female with history of liver cirrhosis, hepatitis C, splenomegaly, portal hypertension, chronic pancytopenia present for follow-up.  No diagnosis found.  Iron deficiency anemia-hemoglobin has dropped some to 11.7 today.  Reports rectal bleeding secondary to hemorrhoids.  Has been evaluated in the past by GI and hemorrhoids appear to be too small for surgery.  Discussed cutting back on iron supplements that are likely contributing to constipation causing her to strain irritating her hemorrhoids.  Recommend she return in 3 months for lab work only to make sure her counts do not continue to drop.  Neutropenia-ANC 1100.  This is stable.  No recent infections.  Thrombocytopenia-secondary to cirrhosis.  Platelets are 39,000.  Baseline.  Gastric polyp-met with specialist in [place] who recommended she wait indicating the risk of removing the gastric polyp exceeds the potential benefits.  She was placed on Protonix.  B12 deficiency-currently takes vitamin B12 lozenges.  We will recheck labs at next visit.  Cirrhosis-AFP normal less than 1.8.  Continue to follow with GI.  Left-sided breast pain-follow-up with Dr. Rebecka Apley.  Imaging today.  Disposition- Follow-up in 3 months for lab work only and in 6 months to see Dr. Tasia Catchings.  I spent 25 minutes dedicated to the care of this patient (face-to-face and non-face-to-face) on the date of the  encounter to include what is described in the assessment and plan.  We spent sufficient time to discuss many aspect of care, questions were answered to patient's satisfaction.  Faythe Casa, NP 01/15/2022 3:04 PM

## 2022-01-13 NOTE — Progress Notes (Signed)
Established Patient Office Visit  Subjective:  Patient ID: Brandi Bright, female    DOB: 17-Jan-1971  Age: 51 y.o. MRN: [id number]  CC:  Chief Complaint  Patient presents with   Breast Pain    Patient complains of pain on the left side. Patient states the pain started 1 week ago after she was getting something out of a drawer and had to pull hard.    Chest Pain  This is a new problem. The current episode started in the past 7 days. The onset quality is sudden. The problem has been gradually worsening. The pain is present in the lateral region. The pain is at a severity of 8/10. The pain is moderate. The pain does not radiate. Pertinent negatives include no abdominal pain, leg pain or lower extremity edema.  Pertinent negatives for past medical history include no anxiety/panic attacks, no connective tissue disease, no COPD, no CHF and no diabetes.   Brandi Bright presents for hurt on lt chest  Past Medical History:  Diagnosis Date   Arthritis    Back pain    Cirrhosis ([place])    Gout    Hypothyroidism    IDA (iron deficiency anemia) 01/16/2020   MI, old    Thyroid disease     Past Surgical History:  Procedure Laterality Date   CARDIAC CATHETERIZATION     CESAREAN SECTION     COLONOSCOPY WITH PROPOFOL N/A 03/16/2018   Procedure: COLONOSCOPY WITH PROPOFOL;  Surgeon: Virgel Manifold, MD;  Location: [facility] ENDOSCOPY;  Service: Endoscopy;  Laterality: N/A;   COLONOSCOPY WITH PROPOFOL N/A 07/09/2021   Procedure: COLONOSCOPY WITH PROPOFOL;  Surgeon: Virgel Manifold, MD;  Location: [facility] ENDOSCOPY;  Service: Endoscopy;  Laterality: N/A;   ESOPHAGOGASTRODUODENOSCOPY N/A 10/11/2019   Procedure: ESOPHAGOGASTRODUODENOSCOPY (EGD);  Surgeon: Lin Landsman, MD;  Location: [facility] ENDOSCOPY;  Service: Gastroenterology;  Laterality: N/A;   ESOPHAGOGASTRODUODENOSCOPY (EGD) WITH PROPOFOL N/A 10/27/2018   Procedure: ESOPHAGOGASTRODUODENOSCOPY (EGD) WITH PROPOFOL;  Surgeon: Virgel Manifold, MD;  Location: [facility] ENDOSCOPY;  Service: Endoscopy;  Laterality: N/A;   ESOPHAGOGASTRODUODENOSCOPY (EGD) WITH PROPOFOL N/A 06/25/2019   Procedure: ESOPHAGOGASTRODUODENOSCOPY (EGD) WITH PROPOFOL;  Surgeon: Jonathon Bellows, MD;  Location: [facility] ENDOSCOPY;  Service: Gastroenterology;  Laterality: N/A;   ESOPHAGOGASTRODUODENOSCOPY (EGD) WITH PROPOFOL N/A 09/12/2019   Procedure: ESOPHAGOGASTRODUODENOSCOPY (EGD) WITH PROPOFOL;  Surgeon: Lucilla Lame, MD;  Location: [facility] ENDOSCOPY;  Service: Endoscopy;  Laterality: N/A;   ESOPHAGOGASTRODUODENOSCOPY (EGD) WITH PROPOFOL N/A 11/20/2019   Procedure: ESOPHAGOGASTRODUODENOSCOPY (EGD) WITH PROPOFOL;  Surgeon: Virgel Manifold, MD;  Location: [facility] ENDOSCOPY;  Service: Endoscopy;  Laterality: N/A;   ESOPHAGOGASTRODUODENOSCOPY (EGD) WITH PROPOFOL N/A 11/23/2019   Procedure: ESOPHAGOGASTRODUODENOSCOPY (EGD) WITH PROPOFOL;  Surgeon: Virgel Manifold, MD;  Location: [facility] ENDOSCOPY;  Service: Endoscopy;  Laterality: N/A;   ESOPHAGOGASTRODUODENOSCOPY (EGD) WITH PROPOFOL N/A 01/03/2020   Procedure: ESOPHAGOGASTRODUODENOSCOPY (EGD) WITH PROPOFOL;  Surgeon: Lin Landsman, MD;  Location: [place];  Service: Endoscopy;  Laterality: N/A;   ESOPHAGOGASTRODUODENOSCOPY (EGD) WITH PROPOFOL N/A 07/09/2021   Procedure: ESOPHAGOGASTRODUODENOSCOPY (EGD) WITH PROPOFOL;  Surgeon: Virgel Manifold, MD;  Location: [facility] ENDOSCOPY;  Service: Endoscopy;  Laterality: N/A;   TUBAL LIGATION      Family History  Problem Relation Age of Onset   Hypertension Other    CAD Father    Colon cancer Father    Lung cancer Father    Pancreatic cancer Brother     Social History   Socioeconomic History   Marital status: Single  Spouse name: Not on file   Number of children: Not on file   Years of education: Not on file   Highest education level: Not on file  Occupational History   Not on file  Tobacco Use   Smoking status: Never   Smokeless tobacco: Never   Vaping Use   Vaping Use: Never used  Substance and Sexual Activity   Alcohol use: Yes    Comment: rarely   Drug use: Never   Sexual activity: Not Currently  Other Topics Concern   Not on file  Social History Narrative   Not on file   Social Determinants of Health   Financial Resource Strain: Not on file  Food Insecurity: Not on file  Transportation Needs: Not on file  Physical Activity: Not on file  Stress: Not on file  Social Connections: Not on file  Intimate Partner Violence: Not on file     Current Outpatient Medications:    Biotin 10 MG CAPS, Take 1 capsule by mouth daily., Disp: , Rfl:    cyanocobalamin ([employer] VITAMIN B12) 1000 MCG tablet, Take 1 tablet (1,000 mcg total) by mouth daily., Disp: 90 tablet, Rfl: 1   ferrous sulfate 325 (65 FE) MG EC tablet, TAKE 1 TABLET BY MOUTH EVERY DAY WITH BREAKFAST, Disp: 30 tablet, Rfl: 2   lactulose (CHRONULAC) 10 GM/15ML solution, TAKE 15 MLS (10 G TOTAL) BY MOUTH DAILY FOR 30 DAYS., Disp: 1892 mL, Rfl: 0   levothyroxine (SYNTHROID) 150 MCG tablet, TAKE 1 TABLET BY MOUTH DAILY, 30 MINUTES BEFORE A MEAL, Disp: 30 tablet, Rfl: 6   nitroGLYCERIN (NITROLINGUAL) 0.4 MG/SPRAY spray, Place 1 spray under the tongue every 5 (five) minutes x 3 doses as needed for chest pain., Disp: 12 g, Rfl: 2   omeprazole (PRILOSEC) 20 MG capsule, Take 1 capsule (20 mg total) by mouth daily., Disp: 90 capsule, Rfl: 3   Oxycodone HCl 10 MG TABS, Take 10 mg by mouth 4 (four) times daily as needed., Disp: , Rfl:    Allergies  Allergen Reactions   Vicodin [Hydrocodone-Acetaminophen] Rash   Amoxicillin Rash and Other (See Comments)    Has patient had a PCN reaction causing immediate rash, facial/tongue/throat swelling, SOB or lightheadedness with hypotension: Yes Has patient had a PCN reaction causing severe rash involving mucus membranes or skin necrosis: No Has patient had a PCN reaction that required hospitalization: No Has patient had a PCN reaction  occurring within the last 10 years: No If all of the above answers are "NO", then may proceed with Cephalosporin use.    Gabapentin Rash    ROS Review of Systems  Cardiovascular:  Positive for chest pain.  Gastrointestinal:  Negative for abdominal pain.     Objective:    Physical Exam Vitals reviewed.  Constitutional:      Appearance: Normal appearance.  HENT:     Mouth/Throat:     Mouth: Mucous membranes are moist.  Eyes:     Pupils: Pupils are equal, round, and reactive to light.  Neck:     Vascular: No carotid bruit.  Cardiovascular:     Rate and Rhythm: Normal rate and regular rhythm.     Pulses: Normal pulses.     Heart sounds: Normal heart sounds.  Pulmonary:     Effort: Pulmonary effort is normal.     Breath sounds: Normal breath sounds.  Abdominal:     General: Bowel sounds are normal.     Palpations: Abdomen is soft. There is no hepatomegaly,  splenomegaly or mass.     Tenderness: There is no abdominal tenderness.     Hernia: No hernia is present.  Musculoskeletal:        General: No tenderness.     Cervical back: Neck supple.     Right lower leg: No edema.     Left lower leg: No edema.     Comments: Tenderness of lt side of chest, no rash  Skin:    Findings: No rash.  Neurological:     Mental Status: She is alert and oriented to person, place, and time.     Motor: No weakness.  Psychiatric:        Mood and Affect: Mood and affect normal.        Behavior: Behavior normal.    BP 132/90    Pulse 71    Ht 4\' 9"  (1.448 m)    Wt 199 lb (90.3 kg)    BMI 43.06 kg/m  Wt Readings from Last 3 Encounters:  01/13/22 199 lb (90.3 kg)  10/20/21 194 lb (88 kg)  10/15/21 199 lb 6.4 oz (90.4 kg)     Health Maintenance Due  Topic Date Due   TETANUS/TDAP  Never done   Zoster Vaccines- Shingrix (1 of 2) Never done   MAMMOGRAM  Never done    There are no preventive care reminders to display for this patient.  Lab Results  Component Value Date   TSH 2.04  11/29/2020   Lab Results  Component Value Date   WBC 2.0 (L) 01/12/2022   HGB 11.7 (L) 01/12/2022   HCT 33.8 (L) 01/12/2022   MCV 86.7 01/12/2022   PLT 39 (L) 01/12/2022   Lab Results  Component Value Date   NA 141 10/20/2021   K 3.9 10/20/2021   CO2 25 10/20/2021   GLUCOSE 94 10/20/2021   BUN 11 10/20/2021   CREATININE 0.38 (L) 10/20/2021   BILITOT 1.2 10/20/2021   ALKPHOS 116 10/20/2021   AST 26 10/20/2021   ALT 17 10/20/2021   PROT 6.4 10/20/2021   ALBUMIN 3.6 10/20/2021   CALCIUM 9.1 10/20/2021   ANIONGAP 7 07/08/2021   GFR 115.91 10/20/2021   Lab Results  Component Value Date   CHOL 107 12/29/2017   Lab Results  Component Value Date   HDL 21 (L) 08/25/2013   Lab Results  Component Value Date   LDLCALC 67 08/25/2013   Lab Results  Component Value Date   TRIG 107 08/25/2013   No results found for: [facility] Lab Results  Component Value Date   HGBA1C 4.9 12/29/2017      Assessment & Plan:   Problem List Items Addressed This Visit       Cardiovascular and Mediastinum   Esophageal varices without bleeding (HCC)    Stable at the present time      Coronary artery disease    Patient denies any history of angina        Respiratory   Pleurisy     Chest pain in the lt side,  Chest x-ray #2 no rash was noted         Digestive   Other cirrhosis of liver ([place])    Patient is under the care of GI specialist        Hematopoietic and Hemostatic   Thrombocytopenia ([place])    Platelet count is 39 thousand        Other   Encounter for screening for malignant neoplasm of breast - Primary  Relevant Orders   MM 3D SCREEN BREAST BILATERAL    No orders of the defined types were placed in this encounter.   Follow-up: No follow-ups on file.    Cletis Athens, MD

## 2022-01-15 ENCOUNTER — Encounter: Payer: Self-pay | Admitting: Oncology

## 2022-01-28 IMAGING — CR DG KNEE COMPLETE 4+V*R*
1 series · 4 of 4 positions shown · non-contrast
Comparison: Plain films of the left knee 02/22/2018.

CLINICAL DATA: Chronic bilateral knee pain.

EXAM:
LEFT KNEE - COMPLETE 4+ VIEW; RIGHT KNEE - COMPLETE 4+ VIEW

[Series 1: dg knee complete 4 views right · 0.14mm/px · 4 of 4 slices shown]
[im 1/4]
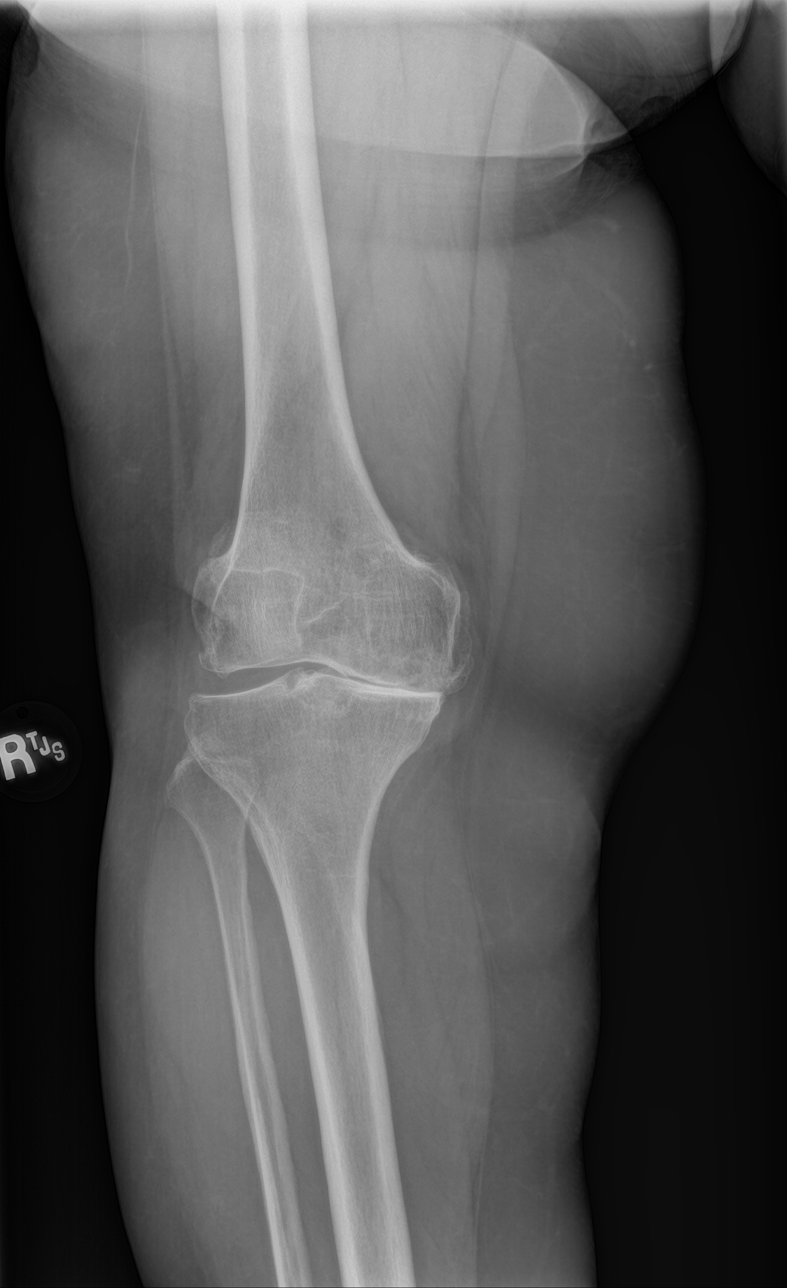
[im 2/4]
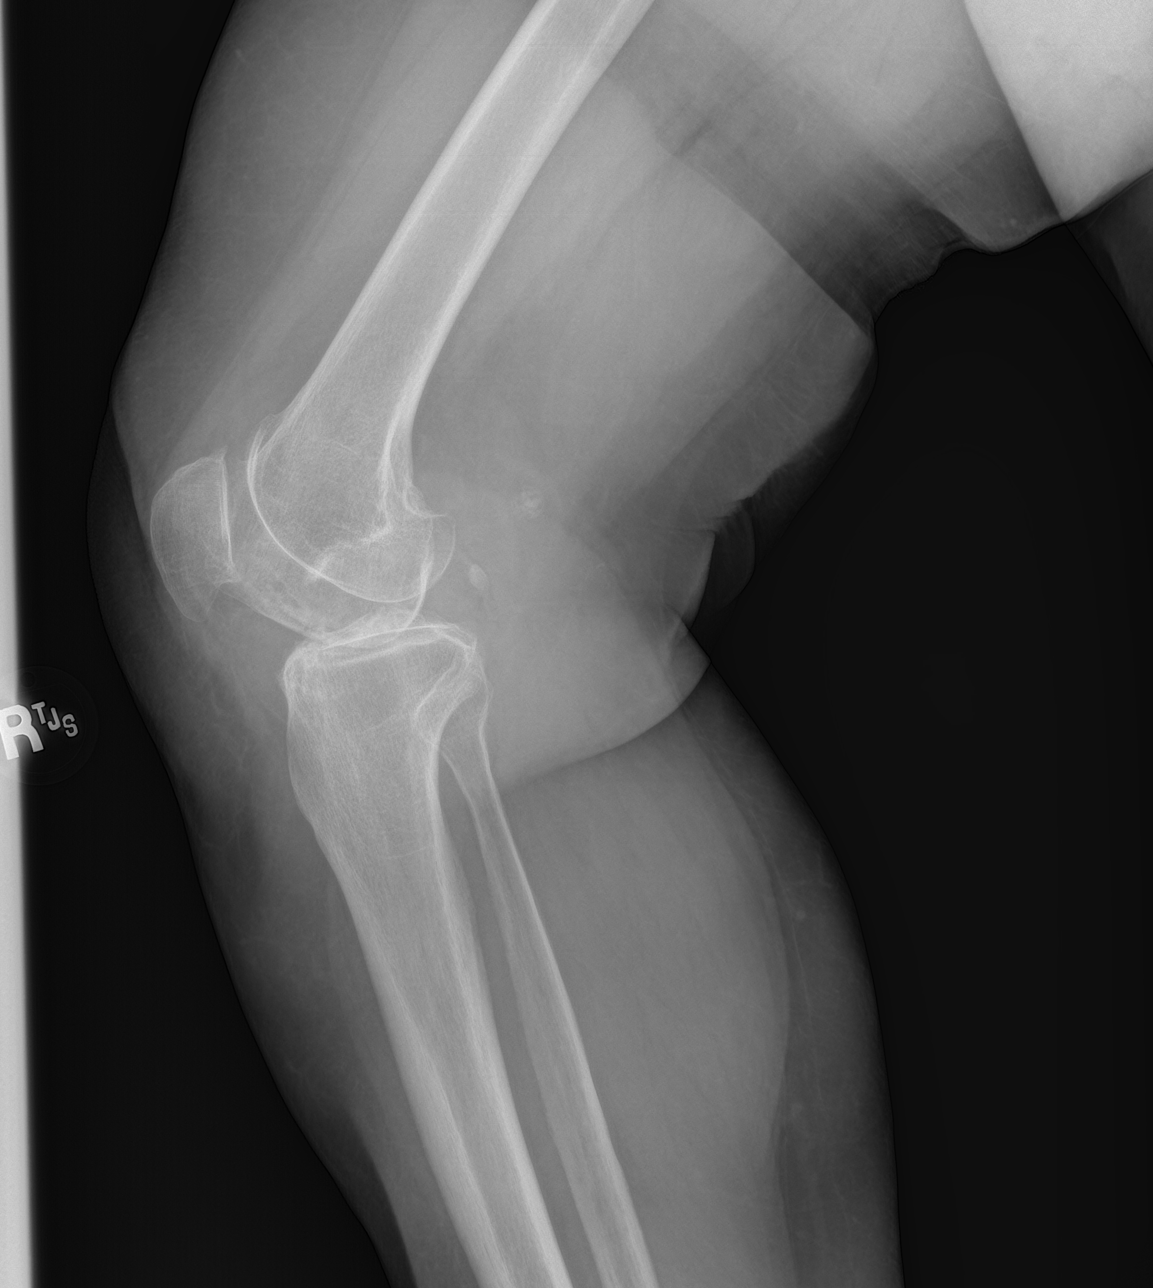
[im 3/4]
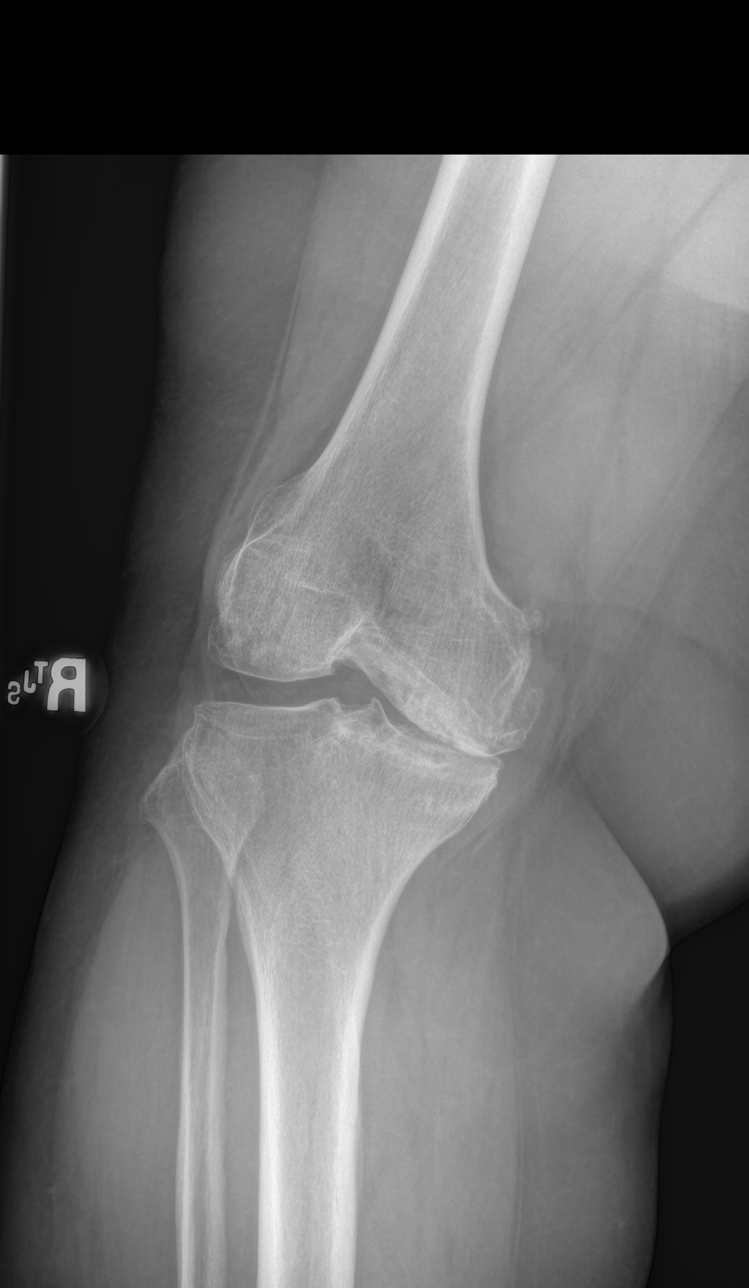
[im 4/4]
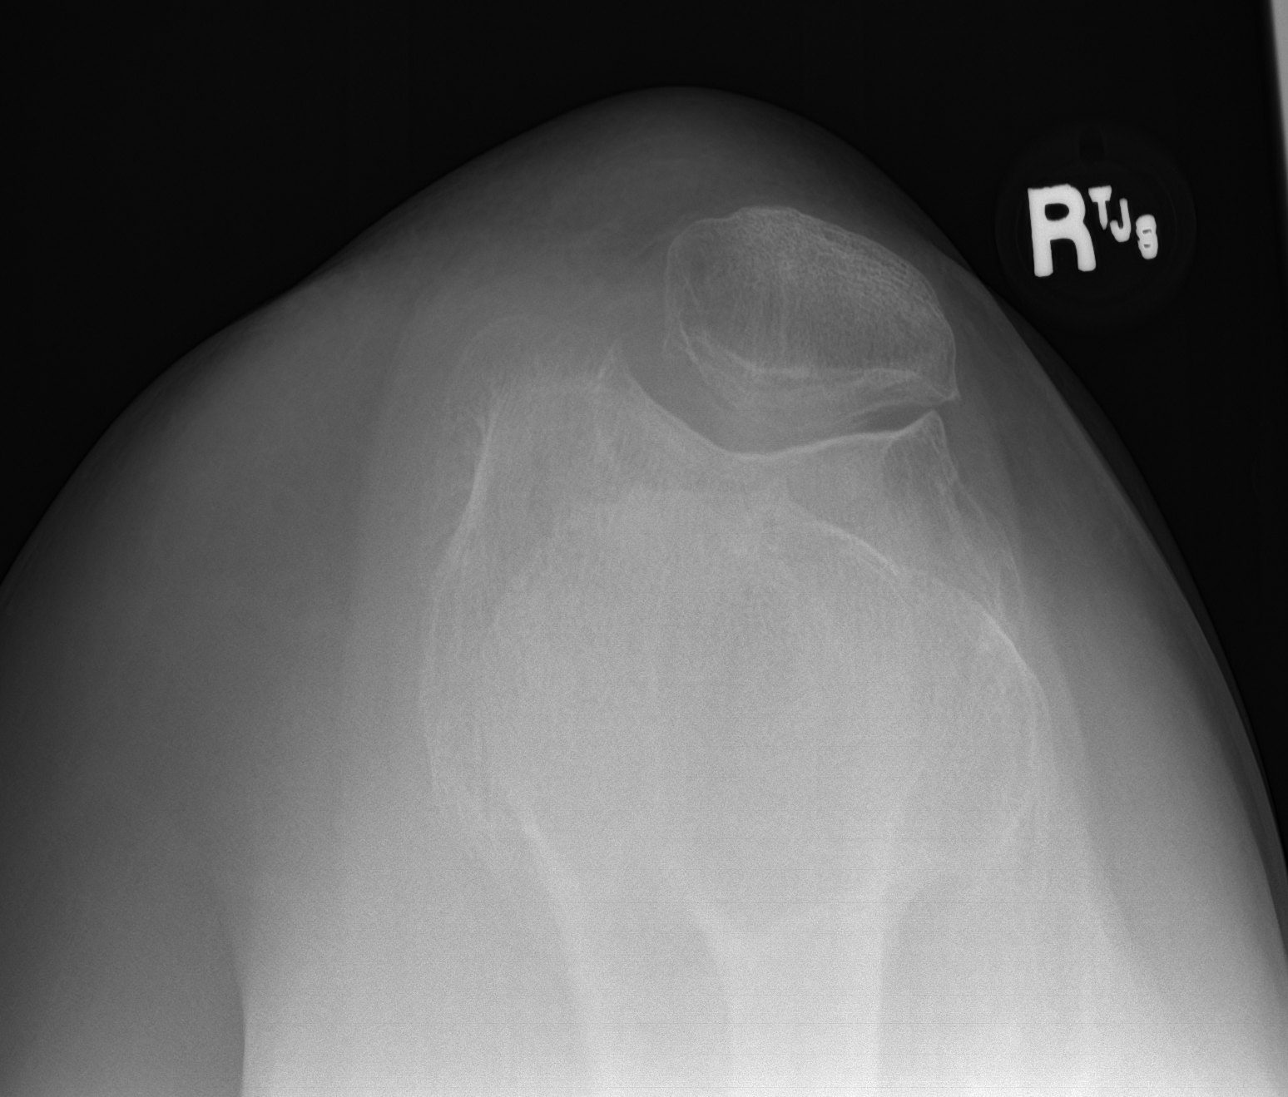

[4 of 4 positions shown; findings below may reference images not displayed]

FINDINGS: The patient has advanced bilateral tricompartmental osteoarthritis
which is worst in the medial compartments. Joint space narrowing,
subchondral sclerosis and osteophytosis about the medial compartment
of the left knee have worsened since the prior examination. Again
seen is a 1.3 cm loose body in the central aspect of the joint on
the left. No comparison for the right knee is available but there is
bone-on-bone medial compartment joint space narrowing and
tricompartmental osteophytosis.

There is no acute bony or joint abnormality the right or left. No
chondrocalcinosis or joint effusion. No erosion.
IMPRESSION: Advanced bilateral knee osteoarthritis is worst in the medial
compartments. Osteoarthritis of the left knee has worsened since the
prior exam. Loose body in the left knee is unchanged. No comparison
for the right knee is available.

## 2022-01-28 IMAGING — CR DG LUMBAR SPINE COMPLETE 4+V
1 series · 6 of 6 positions shown · non-contrast
Comparison: None.

CLINICAL DATA: Chronic low back pain.  No known injury.

EXAM:
LUMBAR SPINE - COMPLETE 4+ VIEW

[Series 1: dg lumbar spine complete 4 +v · 0.14mm/px · 6 of 6 slices shown]
[im 1/6]
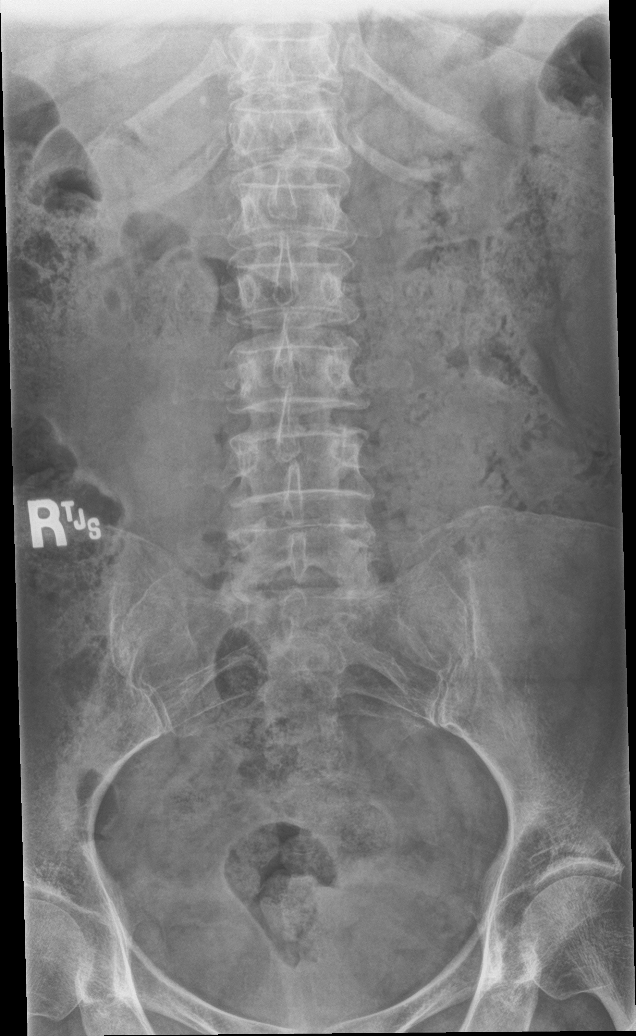
[im 2/6]
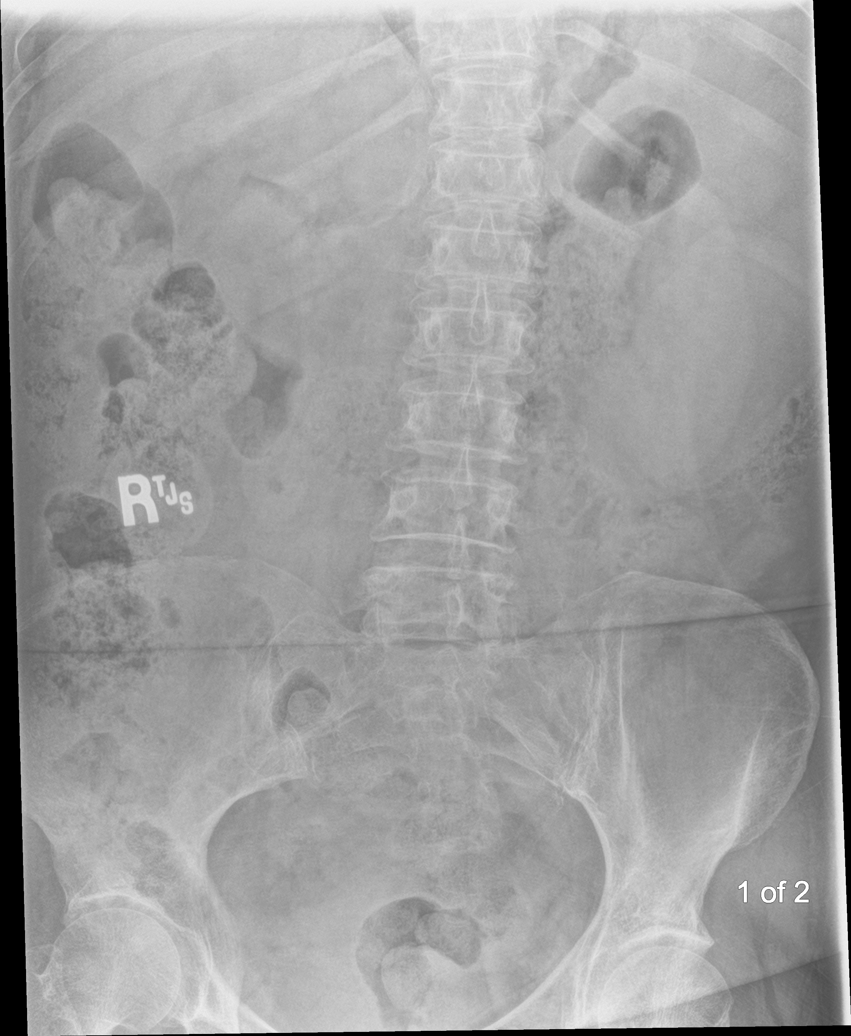
[im 3/6]
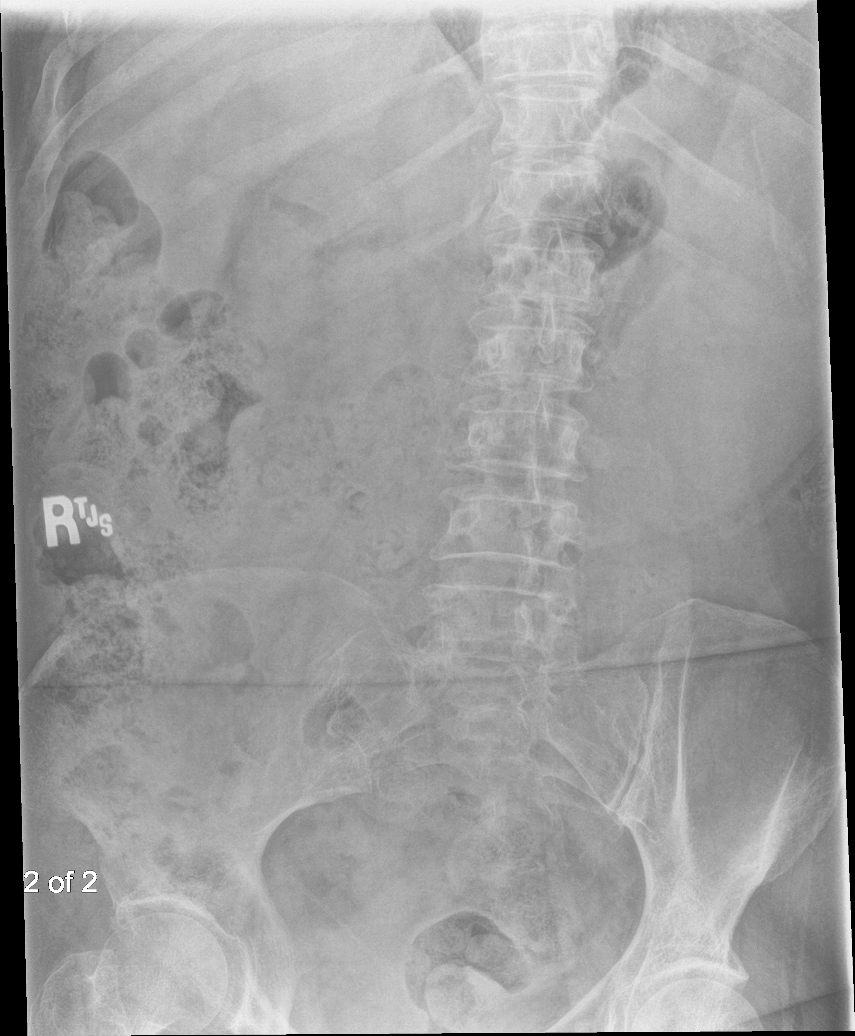
[im 4/6]
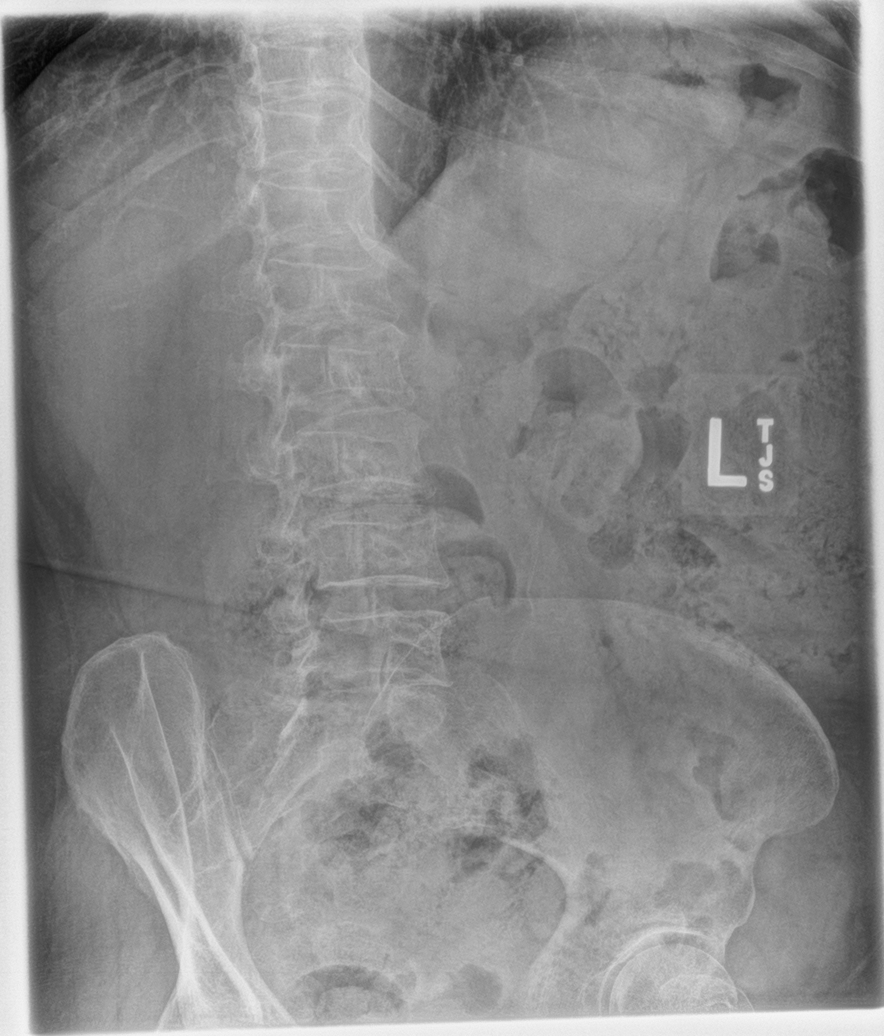
[im 5/6]
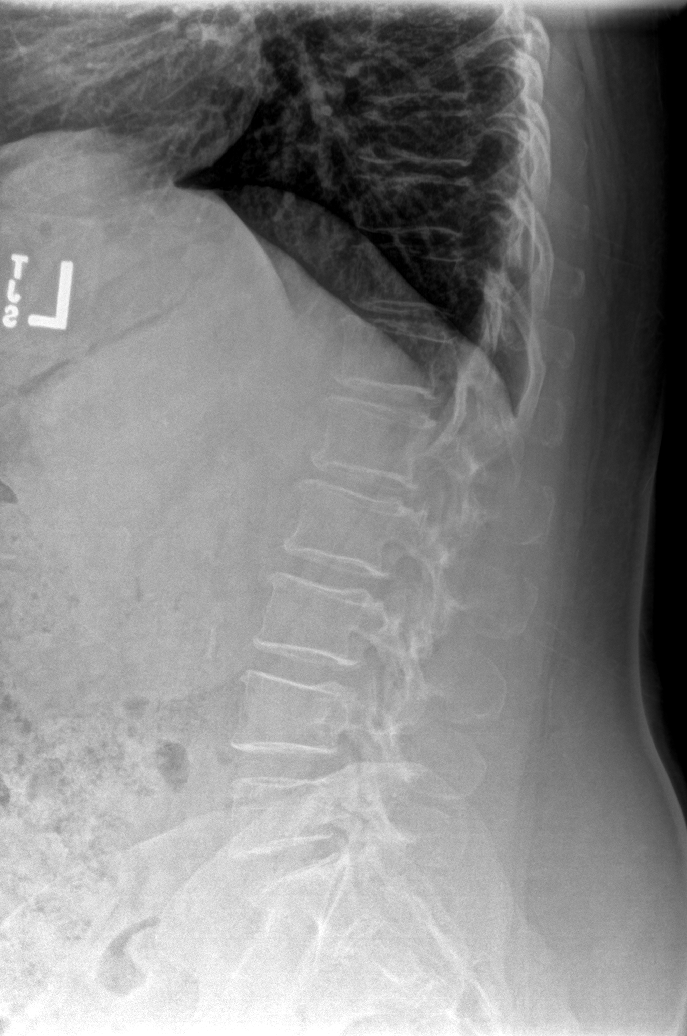
[im 6/6]
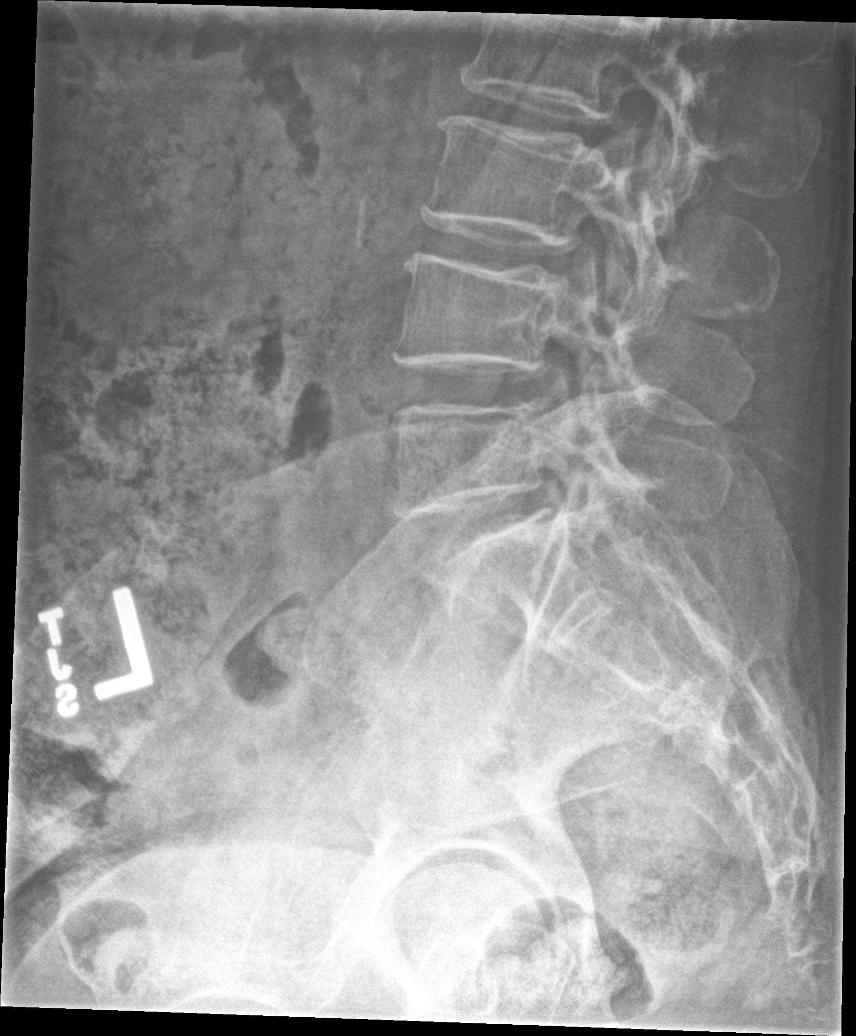

[6 of 6 positions shown; findings below may reference images not displayed]

FINDINGS: There is no evidence of lumbar spine fracture. Alignment is normal.
Intervertebral disc spaces are maintained. Very large volume of
stool throughout the colon noted.
IMPRESSION: Normal appearing lumbar spine.

Large colonic stool burden.

## 2022-03-10 ENCOUNTER — Other Ambulatory Visit: Payer: Self-pay | Admitting: Oncology

## 2022-03-11 ENCOUNTER — Encounter: Payer: Self-pay | Admitting: Oncology

## 2022-04-09 DIAGNOSIS — M542 Cervicalgia: Secondary | ICD-10-CM | POA: Diagnosis not present

## 2022-04-09 DIAGNOSIS — Z79899 Other long term (current) drug therapy: Secondary | ICD-10-CM | POA: Diagnosis not present

## 2022-04-09 DIAGNOSIS — G894 Chronic pain syndrome: Secondary | ICD-10-CM | POA: Diagnosis not present

## 2022-04-09 DIAGNOSIS — M5412 Radiculopathy, cervical region: Secondary | ICD-10-CM | POA: Diagnosis not present

## 2022-04-09 DIAGNOSIS — Z79891 Long term (current) use of opiate analgesic: Secondary | ICD-10-CM | POA: Diagnosis not present

## 2022-04-13 ENCOUNTER — Inpatient Hospital Stay: Payer: Medicaid Other | Attending: Oncology

## 2022-04-13 DIAGNOSIS — E538 Deficiency of other specified B group vitamins: Secondary | ICD-10-CM | POA: Insufficient documentation

## 2022-04-13 DIAGNOSIS — D509 Iron deficiency anemia, unspecified: Secondary | ICD-10-CM | POA: Insufficient documentation

## 2022-04-13 DIAGNOSIS — D696 Thrombocytopenia, unspecified: Secondary | ICD-10-CM

## 2022-04-13 DIAGNOSIS — D500 Iron deficiency anemia secondary to blood loss (chronic): Secondary | ICD-10-CM

## 2022-04-13 LAB — CBC WITH DIFFERENTIAL/PLATELET
Abs Immature Granulocytes: 0 10*3/uL (ref 0.00–0.07)
Basophils Absolute: 0 10*3/uL (ref 0.0–0.1)
Basophils Relative: 1 %
Eosinophils Absolute: 0.1 10*3/uL (ref 0.0–0.5)
Eosinophils Relative: 2 %
HCT: 35.5 % — ABNORMAL LOW (ref 36.0–46.0)
Hemoglobin: 12.5 g/dL (ref 12.0–15.0)
Immature Granulocytes: 0 %
Lymphocytes Relative: 34 %
Lymphs Abs: 0.7 10*3/uL (ref 0.7–4.0)
MCH: 29.6 pg (ref 26.0–34.0)
MCHC: 35.2 g/dL (ref 30.0–36.0)
MCV: 84.1 fL (ref 80.0–100.0)
Monocytes Absolute: 0.2 10*3/uL (ref 0.1–1.0)
Monocytes Relative: 7 %
Neutro Abs: 1.2 10*3/uL — ABNORMAL LOW (ref 1.7–7.7)
Neutrophils Relative %: 56 %
Platelets: 41 10*3/uL — ABNORMAL LOW (ref 150–400)
RBC: 4.22 MIL/uL (ref 3.87–5.11)
RDW: 12.7 % (ref 11.5–15.5)
WBC: 2.1 10*3/uL — ABNORMAL LOW (ref 4.0–10.5)
nRBC: 0 % (ref 0.0–0.2)

## 2022-04-13 LAB — IRON AND TIBC
Iron: 59 ug/dL (ref 28–170)
Saturation Ratios: 14 % (ref 10.4–31.8)
TIBC: 410 ug/dL (ref 250–450)
UIBC: 351 ug/dL

## 2022-04-13 LAB — VITAMIN B12: Vitamin B-12: 826 pg/mL (ref 180–914)

## 2022-04-13 LAB — FERRITIN: Ferritin: 10 ng/mL — ABNORMAL LOW (ref 11–307)

## 2022-05-04 DIAGNOSIS — M170 Bilateral primary osteoarthritis of knee: Secondary | ICD-10-CM | POA: Diagnosis not present

## 2022-05-12 ENCOUNTER — Other Ambulatory Visit: Payer: Self-pay | Admitting: Oncology

## 2022-05-14 ENCOUNTER — Encounter: Payer: Self-pay | Admitting: Oncology

## 2022-06-10 ENCOUNTER — Encounter: Payer: Self-pay | Admitting: Intensive Care

## 2022-06-10 ENCOUNTER — Emergency Department
Admission: EM | Admit: 2022-06-10 | Discharge: 2022-06-10 | Disposition: A | Discharge status: Home / Self Care | Payer: Medicaid Other | Attending: Emergency Medicine | Admitting: Emergency Medicine

## 2022-06-10 ENCOUNTER — Other Ambulatory Visit: Payer: Self-pay

## 2022-06-10 ENCOUNTER — Emergency Department: Payer: Medicaid Other

## 2022-06-10 DIAGNOSIS — M25571 Pain in right ankle and joints of right foot: Secondary | ICD-10-CM | POA: Diagnosis not present

## 2022-06-10 DIAGNOSIS — S93402A Sprain of unspecified ligament of left ankle, initial encounter: Secondary | ICD-10-CM | POA: Diagnosis not present

## 2022-06-10 DIAGNOSIS — W010XXA Fall on same level from slipping, tripping and stumbling without subsequent striking against object, initial encounter: Secondary | ICD-10-CM | POA: Diagnosis not present

## 2022-06-10 DIAGNOSIS — W19XXXA Unspecified fall, initial encounter: Secondary | ICD-10-CM

## 2022-06-10 DIAGNOSIS — S93401A Sprain of unspecified ligament of right ankle, initial encounter: Secondary | ICD-10-CM | POA: Insufficient documentation

## 2022-06-10 DIAGNOSIS — S99911A Unspecified injury of right ankle, initial encounter: Secondary | ICD-10-CM | POA: Diagnosis present

## 2022-06-10 DIAGNOSIS — M7989 Other specified soft tissue disorders: Secondary | ICD-10-CM | POA: Diagnosis not present

## 2022-06-10 DIAGNOSIS — S60221A Contusion of right hand, initial encounter: Secondary | ICD-10-CM | POA: Diagnosis not present

## 2022-06-10 DIAGNOSIS — S60222A Contusion of left hand, initial encounter: Secondary | ICD-10-CM | POA: Diagnosis not present

## 2022-06-10 DIAGNOSIS — M19042 Primary osteoarthritis, left hand: Secondary | ICD-10-CM | POA: Diagnosis not present

## 2022-06-10 HISTORY — DX: Other chronic pain: G89.29

## 2022-06-10 NOTE — Discharge Instructions (Addendum)
Please wear ankle brace for 4 weeks.  You may remove to shower and okay to remove while inside the house on flat even surfaces.  Follow-up with orthopedics in 4 weeks if continued pain or discomfort.  You may take ibuprofen as needed for pain.  Return to the ER for any worsening symptoms or urgent changes in your health

## 2022-06-10 NOTE — ED Triage Notes (Signed)
Patient c/o fall 1.5 weeks ago. Pain in right ankle/foot and swelling noted. Also c/o pain in left arm and fingers

## 2022-06-10 NOTE — ED Provider Notes (Signed)
[place] EMERGENCY DEPARTMENT Provider Note   CSN: [id number] Arrival date & time: 06/10/22  1707     History  Chief Complaint  Patient presents with   Brandi Bright is a 51 y.o. female presents to the emergency department for evaluation of a fall that occurred 10 days ago.  Patient states she may have tripped over elevation in her concrete, she rolled her right ankle and went down on her left hand.  She is having right lateral ankle swelling along the ATFL, she has been able to continue bearing weight on the right lower extremity with only mild pain and swelling.  She also has pain along the left fifth metacarpal that radiates into the ulnar aspect of the wrist.  She denies any numbness or tingling.  No head injury, LOC, nausea or vomiting.  No groin or thigh hip back or knee pain  HPI     Home Medications Prior to Admission medications   Medication Sig Start Date End Date Taking? Authorizing Provider  Biotin 10 MG CAPS Take 1 capsule by mouth daily.    [provider]  cyanocobalamin ([employer] VITAMIN B12) 1000 MCG tablet Take 1 tablet (1,000 mcg total) by mouth daily. 10/22/20   Earlie Server, MD  ferrous sulfate 325 (65 FE) MG EC tablet TAKE 1 TABLET BY MOUTH EVERY DAY WITH BREAKFAST 05/14/22   Earlie Server, MD  lactulose (CHRONULAC) 10 GM/15ML solution TAKE 15 MLS (10 G TOTAL) BY MOUTH DAILY FOR 30 DAYS. 04/09/21   Virgel Manifold, MD  levothyroxine (SYNTHROID) 150 MCG tablet TAKE 1 TABLET BY MOUTH DAILY, 30 MINUTES BEFORE A MEAL 09/15/21   Cletis Athens, MD  nitroGLYCERIN (NITROLINGUAL) 0.4 MG/SPRAY spray Place 1 spray under the tongue every 5 (five) minutes x 3 doses as needed for chest pain. 04/24/20   Cletis Athens, MD  omeprazole (PRILOSEC) 20 MG capsule Take 1 capsule (20 mg total) by mouth daily. 10/20/21   Milus Banister, MD  Oxycodone HCl 10 MG TABS Take 10 mg by mouth 4 (four) times daily as needed. 10/14/20   [provider]       Allergies    Vicodin [hydrocodone-acetaminophen], Amoxicillin, and Gabapentin    Review of Systems   Review of Systems  Physical Exam Updated Vital Signs BP 140/85 (BP Location: Left Arm)   Pulse 92   Temp 98.8 F (37.1 C) (Oral)   Resp 16   Ht '4\' 9"'$  (1.448 m)   Wt 86.2 kg   LMP  (LMP Unknown) Comment: last menstrual 9 years ago, tubal ligation  SpO2 96%   BMI 41.12 kg/m  Physical Exam Constitutional:      Appearance: She is well-developed.  HENT:     Head: Normocephalic and atraumatic.     Right Ear: External ear normal.     Left Ear: External ear normal.     Nose: Nose normal.  Eyes:     Conjunctiva/sclera: Conjunctivae normal.     Pupils: Pupils are equal, round, and reactive to light.  Cardiovascular:     Rate and Rhythm: Normal rate.  Pulmonary:     Effort: Pulmonary effort is normal. No respiratory distress.     Breath sounds: Normal breath sounds.  Abdominal:     Palpations: Abdomen is soft.     Tenderness: There is no abdominal tenderness.  Musculoskeletal:        General: No deformity. Normal range of motion.  Cervical back: Normal range of motion.     Comments: Patient ambulates with no assistive devices.  She has tenderness along the left fifth metacarpal shaft, no significant swelling or skin breakdown noted.  No angulation rotational deformity.  Full active flexion extension of the digits.  For right ankle is tender along the lateral aspect with swelling, no warmth or redness.  Pain with inversion, no pain with eversion.  She has good ankle plantarflexion dorsiflexion.  Nontender along the calcaneus, tarsals or metatarsals.  Skin:    General: Skin is warm and dry.     Findings: No rash.  Neurological:     Mental Status: She is alert and oriented to person, place, and time.     Cranial Nerves: No cranial nerve deficit.     Coordination: Coordination normal.  Psychiatric:        Behavior: Behavior normal.     ED Results / Procedures / Treatments    Labs (all labs ordered are listed, but only abnormal results are displayed) Labs Reviewed - No data to display  EKG None  Radiology DG Ankle Complete Right  Result Date: 06/10/2022 CLINICAL DATA:  Fall, ankle pain EXAM: RIGHT ANKLE - COMPLETE 3+ VIEW COMPARISON:  None Available. FINDINGS: Lateral soft tissue swelling. No acute bony abnormality. Specifically, no fracture, subluxation, or dislocation. Joint spaces maintained. IMPRESSION: No acute bony abnormality. Electronically Signed   By: Rolm Baptise M.D.   On: 06/10/2022 18:29   DG Hand Complete Left  Result Date: 06/10/2022 CLINICAL DATA:  Fall, 5th metacarpal pain EXAM: LEFT HAND - COMPLETE 3+ VIEW COMPARISON:  None Available. FINDINGS: Degenerative changes throughout the IP joints with joint space narrowing and spurring. No acute bony abnormality. Specifically, no fracture, subluxation, or dislocation. Soft tissues are intact. IMPRESSION: No acute bony abnormality. Electronically Signed   By: Rolm Baptise M.D.   On: 06/10/2022 18:29    Procedures Procedures    Medications Ordered in ED Medications - No data to display  ED Course/ Medical Decision Making/ A&P                           Medical Decision Making Amount and/or Complexity of Data Reviewed Radiology: ordered.   51 year old female with fall 10 days ago, she complains of injuring her right ankle with soft tissue swelling along the lateral aspect of the ankle and pain and soreness along the left fifth metacarpal shaft.  X-rays were ordered and independently reviewed by me today showing no evidence of acute bony abnormality of the right ankle or left hand.  She denies any other injury to her body.  History and exam consistent with right ankle sprain and left hand bone contusion.  She is placed into ASO brace today.  She will continue with ibuprofen as needed and will follow-up with orthopedics if no improvement.  She understands signs symptoms return to the ER for Final  Clinical Impression(s) / ED Diagnoses Final diagnoses:  Fall, initial encounter  Mild ankle sprain, right, initial encounter  Contusion of left hand, initial encounter    Rx / DC Orders ED Discharge Orders     None         Renata Caprice 06/10/22 Marko Stai    Duffy Bruce, MD 06/13/22 1116

## 2022-07-01 ENCOUNTER — Inpatient Hospital Stay: Payer: Medicaid Other | Attending: Oncology

## 2022-07-03 ENCOUNTER — Inpatient Hospital Stay: Payer: Medicaid Other | Admitting: Oncology

## 2022-07-08 ENCOUNTER — Ambulatory Visit (INDEPENDENT_AMBULATORY_CARE_PROVIDER_SITE_OTHER): Payer: Medicaid Other | Admitting: Internal Medicine

## 2022-07-08 ENCOUNTER — Encounter: Payer: Self-pay | Admitting: Internal Medicine

## 2022-07-08 VITALS — BP 120/84 | HR 86 | Ht <= 58 in | Wt 199.1 lb

## 2022-07-08 DIAGNOSIS — I10 Essential (primary) hypertension: Secondary | ICD-10-CM

## 2022-07-08 DIAGNOSIS — E669 Obesity, unspecified: Secondary | ICD-10-CM | POA: Diagnosis not present

## 2022-07-08 DIAGNOSIS — I25118 Atherosclerotic heart disease of native coronary artery with other forms of angina pectoris: Secondary | ICD-10-CM | POA: Diagnosis not present

## 2022-07-08 DIAGNOSIS — D500 Iron deficiency anemia secondary to blood loss (chronic): Secondary | ICD-10-CM

## 2022-07-08 DIAGNOSIS — K7469 Other cirrhosis of liver: Secondary | ICD-10-CM | POA: Diagnosis not present

## 2022-07-08 DIAGNOSIS — E039 Hypothyroidism, unspecified: Secondary | ICD-10-CM

## 2022-07-08 DIAGNOSIS — D696 Thrombocytopenia, unspecified: Secondary | ICD-10-CM

## 2022-07-08 DIAGNOSIS — I8500 Esophageal varices without bleeding: Secondary | ICD-10-CM | POA: Diagnosis not present

## 2022-07-08 NOTE — Assessment & Plan Note (Signed)

## 2022-07-08 NOTE — Addendum Note (Signed)
Addended by: Alois Cliche on: 07/08/2022 09:17 AM   Modules accepted: Orders

## 2022-07-08 NOTE — Assessment & Plan Note (Signed)
We will repeat metabolic panel and CBC patient is seeing blood specialist.

## 2022-07-08 NOTE — Assessment & Plan Note (Signed)
Cirrhosis is stable at the present time

## 2022-07-08 NOTE — Assessment & Plan Note (Signed)
Patient denies any history of black stools.

## 2022-07-08 NOTE — Progress Notes (Signed)
Established Patient Office Visit  Subjective:  Patient ID: Brandi Bright, female    DOB: May 06, 1971  Age: 51 y.o. MRN: [id number]  CC:  Chief Complaint  Patient presents with   Insomnia    Patient having trouble falling asleep and staying asleep. Patient feels fatigued during the day.     Insomnia Primary symptoms: fragmented sleep, sleep disturbance.   The onset quality is undetermined. The problem occurs nightly. The problem has been waxing and waning since onset. The symptoms are aggravated by anxiety.    [name] presents for pt wants to lose wt  Past Medical History:  Diagnosis Date   Arthritis    Back pain    Chronic knee pain    Cirrhosis ([place])    Gout    Hypothyroidism    IDA (iron deficiency anemia) 01/16/2020   MI, old    Thyroid disease     Past Surgical History:  Procedure Laterality Date   CARDIAC CATHETERIZATION     CESAREAN SECTION     COLONOSCOPY WITH PROPOFOL N/A 03/16/2018   Procedure: COLONOSCOPY WITH PROPOFOL;  Surgeon: Virgel Manifold, MD;  Location: [facility] ENDOSCOPY;  Service: Endoscopy;  Laterality: N/A;   COLONOSCOPY WITH PROPOFOL N/A 07/09/2021   Procedure: COLONOSCOPY WITH PROPOFOL;  Surgeon: Virgel Manifold, MD;  Location: [facility] ENDOSCOPY;  Service: Endoscopy;  Laterality: N/A;   ESOPHAGOGASTRODUODENOSCOPY N/A 10/11/2019   Procedure: ESOPHAGOGASTRODUODENOSCOPY (EGD);  Surgeon: Lin Landsman, MD;  Location[facility] ENDOSCOPY;  Service: Gastroenterology;  Laterality: N/A;   ESOPHAGOGASTRODUODENOSCOPY (EGD) WITH PROPOFOL N/A 10/27/2018   Procedure: ESOPHAGOGASTRODUODENOSCOPY (EGD) WITH PROPOFOL;  Surgeon: Virgel Manifold, MD;  Location: [facility] ENDOSCOPY;  Service: Endoscopy;  Laterality: N/A;   ESOPHAGOGASTRODUODENOSCOPY (EGD) WITH PROPOFOL N/A 06/25/2019   Procedure: ESOPHAGOGASTRODUODENOSCOPY (EGD) WITH PROPOFOL;  Surgeon: Jonathon Bellows, MD;  Location: [facility] ENDOSCOPY;  Service: Gastroenterology;  Laterality: N/A;    ESOPHAGOGASTRODUODENOSCOPY (EGD) WITH PROPOFOL N/A 09/12/2019   Procedure: ESOPHAGOGASTRODUODENOSCOPY (EGD) WITH PROPOFOL;  Surgeon: Lucilla Lame, MD;  Location: [facility] ENDOSCOPY;  Service: Endoscopy;  Laterality: N/A;   ESOPHAGOGASTRODUODENOSCOPY (EGD) WITH PROPOFOL N/A 11/20/2019   Procedure: ESOPHAGOGASTRODUODENOSCOPY (EGD) WITH PROPOFOL;  Surgeon: Virgel Manifold, MD;  Location: [facility] ENDOSCOPY;  Service: Endoscopy;  Laterality: N/A;   ESOPHAGOGASTRODUODENOSCOPY (EGD) WITH PROPOFOL N/A 11/23/2019   Procedure: ESOPHAGOGASTRODUODENOSCOPY (EGD) WITH PROPOFOL;  Surgeon: Virgel Manifold, MD;  Location: [facility] ENDOSCOPY;  Service: Endoscopy;  Laterality: N/A;   ESOPHAGOGASTRODUODENOSCOPY (EGD) WITH PROPOFOL N/A 01/03/2020   Procedure: ESOPHAGOGASTRODUODENOSCOPY (EGD) WITH PROPOFOL;  Surgeon: Lin Landsman, MD;  Location: [place];  Service: Endoscopy;  Laterality: N/A;   ESOPHAGOGASTRODUODENOSCOPY (EGD) WITH PROPOFOL N/A 07/09/2021   Procedure: ESOPHAGOGASTRODUODENOSCOPY (EGD) WITH PROPOFOL;  Surgeon: Virgel Manifold, MD;  Location: [facility] ENDOSCOPY;  Service: Endoscopy;  Laterality: N/A;   TUBAL LIGATION      Family History  Problem Relation Age of Onset   Hypertension Other    CAD Father    Colon cancer Father    Lung cancer Father    Pancreatic cancer Brother     Social History   Socioeconomic History   Marital status: Single    Spouse name: Not on file   Number of children: Not on file   Years of education: Not on file   Highest education level: Not on file  Occupational History   Not on file  Tobacco Use   Smoking status: Never   Smokeless tobacco: Never  Vaping Use   Vaping Use: Never used  Substance and Sexual Activity   Alcohol use: Yes    Comment: rarely   Drug use: Yes    Comment: prescribed oxycodone and oxycotton   Sexual activity: Not Currently  Other Topics Concern   Not on file  Social History Narrative   Not on file   Social Determinants  of Health   Financial Resource Strain: Medium Risk (10/11/2019)   Overall Financial Resource Strain (CARDIA)    Difficulty of Paying Living Expenses: Somewhat hard  Food Insecurity: No Food Insecurity (10/11/2019)   Hunger Vital Sign    Worried About Running Out of Food in the Last Year: Never true    Ran Out of Food in the Last Year: Never true  Transportation Needs: No Transportation Needs (10/11/2019)   PRAPARE - Hydrologist (Medical): No    Lack of Transportation (Non-Medical): No  Physical Activity: Inactive (10/11/2019)   Exercise Vital Sign    Days of Exercise per Week: 0 days    Minutes of Exercise per Session: 0 min  Stress: Stress Concern Present (10/11/2019)   [place]    Feeling of Stress : Rather much  Social Connections: Unknown (10/11/2019)   Social Connection and Isolation Panel [NHANES]    Frequency of Communication with Friends and Family: More than three times a week    Frequency of Social Gatherings with Friends and Family: More than three times a week    Attends Religious Services: Never    Marine scientist or Organizations: No    Attends Music therapist: Not asked    Marital Status: Not on file  Intimate Partner Violence: Not At Risk (10/11/2019)   Humiliation, Afraid, Rape, and Kick questionnaire    Fear of Current or Ex-Partner: No    Emotionally Abused: No    Physically Abused: No    Sexually Abused: No     Current Outpatient Medications:    Biotin 10 MG CAPS, Take 1 capsule by mouth daily., Disp: , Rfl:    cyanocobalamin ([employer] VITAMIN B12) 1000 MCG tablet, Take 1 tablet (1,000 mcg total) by mouth daily., Disp: 90 tablet, Rfl: 1   ferrous sulfate 325 (65 FE) MG EC tablet, TAKE 1 TABLET BY MOUTH EVERY DAY WITH BREAKFAST, Disp: 30 tablet, Rfl: 2   lactulose (CHRONULAC) 10 GM/15ML solution, TAKE 15 MLS (10 G TOTAL) BY MOUTH DAILY FOR 30  DAYS., Disp: 1892 mL, Rfl: 0   levothyroxine (SYNTHROID) 150 MCG tablet, TAKE 1 TABLET BY MOUTH DAILY, 30 MINUTES BEFORE A MEAL, Disp: 30 tablet, Rfl: 6   nitroGLYCERIN (NITROLINGUAL) 0.4 MG/SPRAY spray, Place 1 spray under the tongue every 5 (five) minutes x 3 doses as needed for chest pain., Disp: 12 g, Rfl: 2   omeprazole (PRILOSEC) 20 MG capsule, Take 1 capsule (20 mg total) by mouth daily., Disp: 90 capsule, Rfl: 3   Oxycodone HCl 10 MG TABS, Take 10 mg by mouth 4 (four) times daily as needed., Disp: , Rfl:    Allergies  Allergen Reactions   Vicodin [Hydrocodone-Acetaminophen] Rash   Amoxicillin Rash and Other (See Comments)    Has patient had a PCN reaction causing immediate rash, facial/tongue/throat swelling, SOB or lightheadedness with hypotension: Yes Has patient had a PCN reaction causing severe rash involving mucus membranes or skin necrosis: No Has patient had a PCN reaction that required hospitalization: No Has patient had a PCN reaction occurring within the last 10 years:  No If all of the above answers are "NO", then may proceed with Cephalosporin use.    Gabapentin Rash    ROS Review of Systems  Constitutional: Negative.   HENT: Negative.    Eyes: Negative.   Respiratory: Negative.    Cardiovascular: Negative.   Gastrointestinal: Negative.   Endocrine: Negative.   Genitourinary: Negative.   Musculoskeletal: Negative.   Skin: Negative.   Allergic/Immunologic: Negative.   Neurological: Negative.   Hematological: Negative.   Psychiatric/Behavioral:  Positive for sleep disturbance. The patient has insomnia.   All other systems reviewed and are negative.     Objective:    Physical Exam Vitals reviewed.  Constitutional:      Appearance: Normal appearance.  HENT:     Mouth/Throat:     Mouth: Mucous membranes are moist.  Eyes:     Pupils: Pupils are equal, round, and reactive to light.  Neck:     Vascular: No carotid bruit.  Cardiovascular:     Rate and  Rhythm: Normal rate and regular rhythm.     Pulses: Normal pulses.     Heart sounds: Normal heart sounds.  Pulmonary:     Effort: Pulmonary effort is normal.     Breath sounds: Normal breath sounds.  Abdominal:     General: Bowel sounds are normal.     Palpations: Abdomen is soft. There is no hepatomegaly, splenomegaly or mass.     Tenderness: There is no abdominal tenderness.     Hernia: No hernia is present.  Musculoskeletal:        General: No tenderness.     Cervical back: Neck supple.     Right lower leg: No edema.     Left lower leg: No edema.  Skin:    Findings: No rash.  Neurological:     Mental Status: She is alert and oriented to person, place, and time.     Motor: No weakness.  Psychiatric:        Mood and Affect: Mood and affect normal.        Behavior: Behavior normal.     BP 120/84   Pulse 86   Ht '4\' 9"'$  (1.448 m)   Wt 199 lb 1.6 oz (90.3 kg)   LMP  (LMP Unknown) Comment: last menstrual 9 years ago, tubal ligation  BMI 43.08 kg/m  Wt Readings from Last 3 Encounters:  07/08/22 199 lb 1.6 oz (90.3 kg)  06/10/22 190 lb (86.2 kg)  01/13/22 200 lb 8 oz (90.9 kg)     Health Maintenance Due  Topic Date Due   COVID-19 Vaccine (1) Never done   TETANUS/TDAP  Never done   Zoster Vaccines- Shingrix (1 of 2) Never done   PAP SMEAR-Modifier  Never done   MAMMOGRAM  Never done    There are no preventive care reminders to display for this patient.  Lab Results  Component Value Date   TSH 2.04 11/29/2020   Lab Results  Component Value Date   WBC 2.1 (L) 04/13/2022   HGB 12.5 04/13/2022   HCT 35.5 (L) 04/13/2022   MCV 84.1 04/13/2022   PLT 41 (L) 04/13/2022   Lab Results  Component Value Date   NA 141 10/20/2021   K 3.9 10/20/2021   CO2 25 10/20/2021   GLUCOSE 94 10/20/2021   BUN 11 10/20/2021   CREATININE 0.38 (L) 10/20/2021   BILITOT 1.2 10/20/2021   ALKPHOS 116 10/20/2021   AST 26 10/20/2021   ALT 17 10/20/2021  PROT 6.4 10/20/2021   ALBUMIN  3.6 10/20/2021   CALCIUM 9.1 10/20/2021   ANIONGAP 7 07/08/2021   GFR 115.91 10/20/2021   Lab Results  Component Value Date   CHOL 107 12/29/2017   Lab Results  Component Value Date   HDL 21 (L) 08/25/2013   Lab Results  Component Value Date   LDLCALC 67 08/25/2013   Lab Results  Component Value Date   TRIG 107 08/25/2013   No results found for: "CHOLHDL" Lab Results  Component Value Date   HGBA1C 4.9 12/29/2017      Assessment & Plan:   Problem List Items Addressed This Visit       Cardiovascular and Mediastinum   Esophageal varices without bleeding ([place])    Patient denies any history of black stools.      Coronary artery disease - Primary    Stable      Essential hypertension     Patient denies any chest pain or shortness of breath there is no history of palpitation or paroxysmal nocturnal dyspnea   patient was advised to follow low-salt low-cholesterol diet    ideally I want to keep systolic blood pressure below 130 mmHg, patient was asked to check blood pressure one times a week and give me a report on that.  Patient will be follow-up in 3 months  or earlier as needed, patient will call me back for any change in the cardiovascular symptoms Patient was advised to buy a book from local bookstore concerning blood pressure and read several chapters  every day.  This will be supplemented by some of the material we will give him from the office.  Patient should also utilize other resources like YouTube and Internet to learn more about the blood pressure and the diet.        Digestive   Other cirrhosis of liver (HCC)    Cirrhosis is stable at the present time        Hematopoietic and Hemostatic   Thrombocytopenia ([place])    We will repeat metabolic panel and CBC patient is seeing blood specialist.     Patient also complaining of pain in the right ankle.  She had an x-ray done in the hospital which was okay.  It looks like she sprained some of the ligament in  the ankle area I will send her to the orthopedic surgeon.  Patient also want to lose weight and is requesting to take Ozempic ongoing to look at the blood test and then decide whether she is a candidate for Ozempic or not.  She is also seeing the hematologist for abnormal CBC, her platelet count is low because of cirrhosis of liver.  We will check it again also check WBC count.  No orders of the defined types were placed in this encounter.   Follow-up: No follow-ups on file.    Cletis Athens, MD

## 2022-07-08 NOTE — Assessment & Plan Note (Signed)
Stable

## 2022-07-09 ENCOUNTER — Other Ambulatory Visit: Payer: Self-pay | Admitting: Internal Medicine

## 2022-07-09 DIAGNOSIS — G894 Chronic pain syndrome: Secondary | ICD-10-CM | POA: Diagnosis not present

## 2022-07-09 DIAGNOSIS — Z79891 Long term (current) use of opiate analgesic: Secondary | ICD-10-CM | POA: Diagnosis not present

## 2022-07-09 DIAGNOSIS — M542 Cervicalgia: Secondary | ICD-10-CM | POA: Diagnosis not present

## 2022-07-09 DIAGNOSIS — Z79899 Other long term (current) drug therapy: Secondary | ICD-10-CM | POA: Diagnosis not present

## 2022-07-09 LAB — CBC WITH DIFFERENTIAL/PLATELET
Absolute Monocytes: 255 cells/uL (ref 200–950)
Basophils Absolute: 20 cells/uL (ref 0–200)
Basophils Relative: 0.7 %
Eosinophils Absolute: 93 cells/uL (ref 15–500)
Eosinophils Relative: 3.2 %
HCT: 38.1 % (ref 35.0–45.0)
Hemoglobin: 12.7 g/dL (ref 11.7–15.5)
Lymphs Abs: 1018 cells/uL (ref 850–3900)
MCH: 29.5 pg (ref 27.0–33.0)
MCHC: 33.3 g/dL (ref 32.0–36.0)
MCV: 88.6 fL (ref 80.0–100.0)
MPV: 12.3 fL (ref 7.5–12.5)
Monocytes Relative: 8.8 %
Neutro Abs: 1514 cells/uL (ref 1500–7800)
Neutrophils Relative %: 52.2 %
Platelets: 60 10*3/uL — ABNORMAL LOW (ref 140–400)
RBC: 4.3 10*6/uL (ref 3.80–5.10)
RDW: 12.4 % (ref 11.0–15.0)
Total Lymphocyte: 35.1 %
WBC: 2.9 10*3/uL — ABNORMAL LOW (ref 3.8–10.8)

## 2022-07-09 LAB — COMPLETE METABOLIC PANEL WITH GFR
AG Ratio: 1.4 (calc) (ref 1.0–2.5)
ALT: 16 U/L (ref 6–29)
AST: 28 U/L (ref 10–35)
Albumin: 3.7 g/dL (ref 3.6–5.1)
Alkaline phosphatase (APISO): 122 U/L (ref 37–153)
BUN: 12 mg/dL (ref 7–25)
CO2: 24 mmol/L (ref 20–32)
Calcium: 9.1 mg/dL (ref 8.6–10.4)
Chloride: 107 mmol/L (ref 98–110)
Creat: 0.54 mg/dL (ref 0.50–1.03)
Globulin: 2.7 g/dL (calc) (ref 1.9–3.7)
Glucose, Bld: 100 mg/dL — ABNORMAL HIGH (ref 65–99)
Potassium: 4 mmol/L (ref 3.5–5.3)
Sodium: 141 mmol/L (ref 135–146)
Total Bilirubin: 0.9 mg/dL (ref 0.2–1.2)
Total Protein: 6.4 g/dL (ref 6.1–8.1)
eGFR: 111 mL/min/{1.73_m2} (ref >=60)

## 2022-07-09 LAB — TSH: TSH: 0.01 mIU/L — ABNORMAL LOW

## 2022-07-09 LAB — LIPID PANEL
Cholesterol: 118 mg/dL (ref <200)
HDL: 30 mg/dL — ABNORMAL LOW (ref >=50)
LDL Cholesterol (Calc): 69 mg/dL (calc)
Non-HDL Cholesterol (Calc): 88 mg/dL (calc) (ref <130)
Total CHOL/HDL Ratio: 3.9 (calc) (ref <5.0)
Triglycerides: 105 mg/dL (ref <150)

## 2022-07-09 LAB — T4: T4, Total: 11.1 ug/dL (ref 5.1–11.9)

## 2022-07-09 LAB — T3: T3, Total: 150 ng/dL (ref 76–181)

## 2022-07-17 ENCOUNTER — Ambulatory Visit: Payer: Medicaid Other | Admitting: Nurse Practitioner

## 2022-07-17 ENCOUNTER — Encounter: Payer: Self-pay | Admitting: Nurse Practitioner

## 2022-07-17 VITALS — BP 137/86 | HR 71 | Ht <= 58 in | Wt 201.2 lb

## 2022-07-17 DIAGNOSIS — D709 Neutropenia, unspecified: Secondary | ICD-10-CM | POA: Diagnosis not present

## 2022-07-17 DIAGNOSIS — E785 Hyperlipidemia, unspecified: Secondary | ICD-10-CM | POA: Diagnosis not present

## 2022-07-23 ENCOUNTER — Inpatient Hospital Stay: Payer: Medicaid Other | Attending: Oncology | Admitting: Oncology

## 2022-07-23 ENCOUNTER — Encounter: Payer: Self-pay | Admitting: Oncology

## 2022-07-23 ENCOUNTER — Inpatient Hospital Stay: Payer: Medicaid Other

## 2022-07-23 VITALS — BP 132/87 | HR 69 | Temp 99.1°F | Resp 18 | Wt 201.2 lb

## 2022-07-23 DIAGNOSIS — K7460 Unspecified cirrhosis of liver: Secondary | ICD-10-CM | POA: Diagnosis not present

## 2022-07-23 DIAGNOSIS — D500 Iron deficiency anemia secondary to blood loss (chronic): Secondary | ICD-10-CM | POA: Diagnosis not present

## 2022-07-23 DIAGNOSIS — D696 Thrombocytopenia, unspecified: Secondary | ICD-10-CM | POA: Diagnosis not present

## 2022-07-23 DIAGNOSIS — R161 Splenomegaly, not elsewhere classified: Secondary | ICD-10-CM | POA: Diagnosis not present

## 2022-07-23 DIAGNOSIS — Z800 Family history of malignant neoplasm of digestive organs: Secondary | ICD-10-CM | POA: Diagnosis not present

## 2022-07-23 DIAGNOSIS — Z801 Family history of malignant neoplasm of trachea, bronchus and lung: Secondary | ICD-10-CM | POA: Diagnosis not present

## 2022-07-23 DIAGNOSIS — E559 Vitamin D deficiency, unspecified: Secondary | ICD-10-CM | POA: Diagnosis not present

## 2022-07-23 DIAGNOSIS — D61818 Other pancytopenia: Secondary | ICD-10-CM | POA: Insufficient documentation

## 2022-07-23 DIAGNOSIS — D509 Iron deficiency anemia, unspecified: Secondary | ICD-10-CM | POA: Diagnosis present

## 2022-07-23 LAB — CBC WITH DIFFERENTIAL/PLATELET
Abs Immature Granulocytes: 0.01 10*3/uL (ref 0.00–0.07)
Basophils Absolute: 0 10*3/uL (ref 0.0–0.1)
Basophils Relative: 1 %
Eosinophils Absolute: 0.1 10*3/uL (ref 0.0–0.5)
Eosinophils Relative: 3 %
HCT: 34.9 % — ABNORMAL LOW (ref 36.0–46.0)
Hemoglobin: 11.8 g/dL — ABNORMAL LOW (ref 12.0–15.0)
Immature Granulocytes: 1 %
Lymphocytes Relative: 34 %
Lymphs Abs: 0.7 10*3/uL (ref 0.7–4.0)
MCH: 28.8 pg (ref 26.0–34.0)
MCHC: 33.8 g/dL (ref 30.0–36.0)
MCV: 85.1 fL (ref 80.0–100.0)
Monocytes Absolute: 0.2 10*3/uL (ref 0.1–1.0)
Monocytes Relative: 8 %
Neutro Abs: 1 10*3/uL — ABNORMAL LOW (ref 1.7–7.7)
Neutrophils Relative %: 53 %
Platelets: 47 10*3/uL — ABNORMAL LOW (ref 150–400)
RBC: 4.1 MIL/uL (ref 3.87–5.11)
RDW: 12.4 % (ref 11.5–15.5)
WBC: 1.9 10*3/uL — ABNORMAL LOW (ref 4.0–10.5)
nRBC: 0 % (ref 0.0–0.2)

## 2022-07-23 LAB — COMPREHENSIVE METABOLIC PANEL
ALT: 19 U/L (ref 0–44)
AST: 33 U/L (ref 15–41)
Albumin: 3.3 g/dL — ABNORMAL LOW (ref 3.5–5.0)
Alkaline Phosphatase: 107 U/L (ref 38–126)
Anion gap: 3 — ABNORMAL LOW (ref 5–15)
BUN: 12 mg/dL (ref 6–20)
CO2: 26 mmol/L (ref 22–32)
Calcium: 8.8 mg/dL — ABNORMAL LOW (ref 8.9–10.3)
Chloride: 111 mmol/L (ref 98–111)
Creatinine, Ser: 0.42 mg/dL — ABNORMAL LOW (ref 0.44–1.00)
GFR, Estimated: >60 mL/min (ref >60)
Glucose, Bld: 105 mg/dL — ABNORMAL HIGH (ref 70–99)
Potassium: 4 mmol/L (ref 3.5–5.1)
Sodium: 140 mmol/L (ref 135–145)
Total Bilirubin: 0.8 mg/dL (ref 0.3–1.2)
Total Protein: 6.4 g/dL — ABNORMAL LOW (ref 6.5–8.1)

## 2022-07-23 NOTE — Progress Notes (Signed)
Pt here for follow up. Pt reports she has been having problems sleeping and when she eats she starts having pain to stomach and back.

## 2022-07-24 LAB — AFP TUMOR MARKER: AFP, Serum, Tumor Marker: <1.8 ng/mL (ref 0.0–9.2)

## 2022-07-25 ENCOUNTER — Encounter: Payer: Self-pay | Admitting: Oncology

## 2022-07-25 MED ORDER — CYANOCOBALAMIN 1000 MCG PO TABS: 1000.0000 ug | ORAL_TABLET | Freq: Every day | ORAL | 1 refills | Status: DC

## 2022-07-25 NOTE — Progress Notes (Signed)
[facility] day:  07/25/2022  Chief Complaint: Brandi Bright is a 51 y.o. female follows up for management of iron deficiency anemia, thrombocytopenia, and leukopenia   PERTINENT HEMATOLOGY HISTORY Patient follows up with Dr. Mike Gip previously.  Establish care with me on 02/09/2019. Reviewed patient's previous medical records, labs, imaging results. # History of cirrhosis and hepatitis C.  She has a history of chronic anemia, thrombocytopenia and leukopenia dating back to 03/2012.  Platelet count has fluctuated between 54,000 - 56,000 since 12/28/2017 (previously 73,000 - 134,000).  Ferritin was 14 on 08/31/2018.    Work-up on 09/23/2018 revealed a hematocrit of 31.0, hemoglobin 10.1, MCV 87.6, platelets 46,000, WBC 1900 with an ANC of 1100.  Ferritin was 10 (low) with iron saturation 16% with a TIBC of 401.  Retic was 1.2%.  B12 was 315.  Normal studies included: folate, SPEP, and free light chain ratio.  Copper was 69 (72-166).  ANA was + with double stranded DNA antibody 13 (0-9).  TSH was 0.036 (0.35-4.5) with a free T4 1.05 (0.82-1.77).  Peripheral smear revealed variant lymphocytes.  Ferritin has been followed:  14 on 08/31/2018 and 10 on 09/23/2018.  Abdomen and pelvic CT on 07/14/2018 revealed cirrhosis with portal hypertension noted by prominent splenomegaly (18.5 x 13.8 x 8.1 cm; volume 1100 cm3), enlarged portal veins with recannulated umbilical vein, paraesophageal and perigastric varices.  There was mild thickening of the cecum and ascending colon.  EGD on 10/27/2018 revealed grade II esophageal varices.  There was erythematous mucosa in the antrum.  There was congested, erythematous, friable (with contact bleeding), granular and nodular mucosa in the gastric fundus and astric body.  There was a single gastric polyp (polypoid ulcerated antral type mucos with chronic active mucosal inflammation).  There was a normal duodenal bulb, second  portion of the duodenum and examined duodenum.  The patient's iron deficiency could be explained by her friable gastric mucosa (likely from portal hypertension).  There was no history of variceal bleeding and thus this is not a cause of her iron deficiency.  # admitted from 10/11/2019 to 10/12/2019 due to rectal bleeding EGD 10/12/2019 showed portal hypertensive gastropathy, treated with APC.  Nonbleeding large esophageal varices incompletely evaluated.  Banded. Patient follows with gastroenterology and had EGD on 11/20/2019. Banding was not done due to large amount of food in the stomach. There was plan for EGD on 11/23/2019.  Her gastroenterologist Dr. Bonna Gains contacted me to see if patient can get platelet transfusion to improve her platelet counts for banding on 11/23/2019.     INTERVAL HISTORY Brandi Bright is a 51 y.o. female who has above history reviewed by me today presents for follow up visit for management of thrombocytopenia, splenomegaly, leukopenia and anemia. She feels well today. Denies any bleeding events. + easy bruising She has not followed up with GI recently.  Last US abdomen was 10/2021    Review of Systems  Constitutional:  Positive for fatigue. Negative for appetite change, chills and fever.  HENT:   Negative for hearing loss and voice change.   Eyes:  Negative for eye problems.  Respiratory:  Negative for chest tightness and cough.   Cardiovascular:  Negative for chest pain.  Gastrointestinal:  Negative for abdominal distention, abdominal pain and blood in stool.  Endocrine: Negative for hot flashes.  Genitourinary:  Negative for difficulty urinating and frequency.   Musculoskeletal:  Negative for arthralgias.  Skin:  Negative for itching and rash.  Neurological:  Negative for extremity weakness.  Hematological:  Negative for adenopathy. Bruises/bleeds easily.  Psychiatric/Behavioral:  Negative for confusion.    Past Medical History:  Diagnosis Date    Arthritis    Back pain    Chronic knee pain    Cirrhosis (HCC)    Gout    Hypothyroidism    IDA (iron deficiency anemia) 01/16/2020   MI, old    Thyroid disease     Past Surgical History:  Procedure Laterality Date   CARDIAC CATHETERIZATION     CESAREAN SECTION     COLONOSCOPY WITH PROPOFOL N/A 03/16/2018   Procedure: COLONOSCOPY WITH PROPOFOL;  Surgeon: Virgel Manifold, MD;  Location: [facility] ENDOSCOPY;  Service: Endoscopy;  Laterality: N/A;   COLONOSCOPY WITH PROPOFOL N/A 07/09/2021   Procedure: COLONOSCOPY WITH PROPOFOL;  Surgeon: Virgel Manifold, MD;  Location: [facility] ENDOSCOPY;  Service: Endoscopy;  Laterality: N/A;   ESOPHAGOGASTRODUODENOSCOPY N/A 10/11/2019   Procedure: ESOPHAGOGASTRODUODENOSCOPY (EGD);  Surgeon: Lin Landsman, MD;  Location[facility] ENDOSCOPY;  Service: Gastroenterology;  Laterality: N/A;   ESOPHAGOGASTRODUODENOSCOPY (EGD) WITH PROPOFOL N/A 10/27/2018   Procedure: ESOPHAGOGASTRODUODENOSCOPY (EGD) WITH PROPOFOL;  Surgeon: Virgel Manifold, MD;  Location: [facility] ENDOSCOPY;  Service: Endoscopy;  Laterality: N/A;   ESOPHAGOGASTRODUODENOSCOPY (EGD) WITH PROPOFOL N/A 06/25/2019   Procedure: ESOPHAGOGASTRODUODENOSCOPY (EGD) WITH PROPOFOL;  Surgeon: Jonathon Bellows, MD;  Location: [facility] ENDOSCOPY;  Service: Gastroenterology;  Laterality: N/A;   ESOPHAGOGASTRODUODENOSCOPY (EGD) WITH PROPOFOL N/A 09/12/2019   Procedure: ESOPHAGOGASTRODUODENOSCOPY (EGD) WITH PROPOFOL;  Surgeon: Lucilla Lame, MD;  Location: [facility] ENDOSCOPY;  Service: Endoscopy;  Laterality: N/A;   ESOPHAGOGASTRODUODENOSCOPY (EGD) WITH PROPOFOL N/A 11/20/2019   Procedure: ESOPHAGOGASTRODUODENOSCOPY (EGD) WITH PROPOFOL;  Surgeon: Virgel Manifold, MD;  Location: [facility] ENDOSCOPY;  Service: Endoscopy;  Laterality: N/A;   ESOPHAGOGASTRODUODENOSCOPY (EGD) WITH PROPOFOL N/A 11/23/2019   Procedure: ESOPHAGOGASTRODUODENOSCOPY (EGD) WITH PROPOFOL;  Surgeon: Virgel Manifold, MD;  Location: [facility] ENDOSCOPY;   Service: Endoscopy;  Laterality: N/A;   ESOPHAGOGASTRODUODENOSCOPY (EGD) WITH PROPOFOL N/A 01/03/2020   Procedure: ESOPHAGOGASTRODUODENOSCOPY (EGD) WITH PROPOFOL;  Surgeon: Lin Landsman, MD;  Location: [place];  Service: Endoscopy;  Laterality: N/A;   ESOPHAGOGASTRODUODENOSCOPY (EGD) WITH PROPOFOL N/A 07/09/2021   Procedure: ESOPHAGOGASTRODUODENOSCOPY (EGD) WITH PROPOFOL;  Surgeon: Virgel Manifold, MD;  Location: [facility] ENDOSCOPY;  Service: Endoscopy;  Laterality: N/A;   TUBAL LIGATION      Family History  Problem Relation Age of Onset   Hypertension Other    CAD Father    Colon cancer Father    Lung cancer Father    Pancreatic cancer Brother    Social History   Socioeconomic History   Marital status: Single    Spouse name: Not on file   Number of children: Not on file   Years of education: Not on file   Highest education level: Not on file  Occupational History   Not on file  Tobacco Use   Smoking status: Never   Smokeless tobacco: Never  Vaping Use   Vaping Use: Never used  Substance and Sexual Activity   Alcohol use: Yes    Comment: rarely   Drug use: Yes    Comment: prescribed oxycodone and oxycotton   Sexual activity: Not Currently  Other Topics Concern   Not on file  Social History Narrative   Not on file   Social Determinants of Health   Financial Resource Strain: Medium Risk (10/11/2019)   Overall Financial Resource Strain (CARDIA)    Difficulty of Paying Living Expenses: Somewhat hard  Food Insecurity: No Food Insecurity (10/11/2019)   Hunger Vital Sign    Worried About Running Out of Food in the Last Year: Never true    Ran Out of Food in the Last Year: Never true  Transportation Needs: No Transportation Needs (10/11/2019)   PRAPARE - Hydrologist (Medical): No    Lack of Transportation (Non-Medical): No  Physical Activity: Inactive (10/11/2019)   Exercise Vital Sign    Days of Exercise per Week: 0 days     Minutes of Exercise per Session: 0 min  Stress: Stress Concern Present (10/11/2019)   [place]    Feeling of Stress : Rather much  Social Connections: Unknown (10/11/2019)   Social Connection and Isolation Panel [NHANES]    Frequency of Communication with Friends and Family: More than three times a week    Frequency of Social Gatherings with Friends and Family: More than three times a week    Attends Religious Services: Never    Marine scientist or Organizations: No    Attends Music therapist: Not asked    Marital Status: Not on file  Intimate Partner Violence: Not At Risk (10/11/2019)   Humiliation, Afraid, Rape, and Kick questionnaire    Fear of Current or Ex-Partner: No    Emotionally Abused: No    Physically Abused: No    Sexually Abused: No    Allergies:  Allergies  Allergen Reactions   Vicodin [Hydrocodone-Acetaminophen] Rash   Amoxicillin Rash and Other (See Comments)    Has patient had a PCN reaction causing immediate rash, facial/tongue/throat swelling, SOB or lightheadedness with hypotension: Yes Has patient had a PCN reaction causing severe rash involving mucus membranes or skin necrosis: No Has patient had a PCN reaction that required hospitalization: No Has patient had a PCN reaction occurring within the last 10 years: No If all of the above answers are "NO", then may proceed with Cephalosporin use.    Gabapentin Rash    Current Medications: Current Outpatient Medications  Medication Sig Dispense Refill   cyanocobalamin ([employer] VITAMIN B12) 1000 MCG tablet Take 1 tablet (1,000 mcg total) by mouth daily. 90 tablet 1   ferrous sulfate 325 (65 FE) MG EC tablet TAKE 1 TABLET BY MOUTH EVERY DAY WITH BREAKFAST 30 tablet 2   levothyroxine (SYNTHROID) 150 MCG tablet TAKE 1 TABLET BY MOUTH DAILY, 30 MINUTES BEFORE A MEAL 30 tablet 6   omeprazole (PRILOSEC) 20 MG capsule Take 1 capsule (20  mg total) by mouth daily. 90 capsule 3   OXYCONTIN 10 MG 12 hr tablet SMARTSIG:1 Tablet(s) By Mouth Every 12 Hours     ZTLIDO 1.8 % PTCH Apply 1 patch topically daily.     nitroGLYCERIN (NITROLINGUAL) 0.4 MG/SPRAY spray Place 1 spray under the tongue every 5 (five) minutes x 3 doses as needed for chest pain. (Patient not taking: Reported on 07/23/2022) 12 g 2   Oxycodone HCl 10 MG TABS Take 10 mg by mouth 4 (four) times daily as needed. (Patient not taking: Reported on 07/23/2022)     No current facility-administered medications for this visit.     Physical Exam: Blood pressure 132/87, pulse 69, temperature 99.1 F (37.3 C), resp. rate 18, weight 201 lb 3.2 oz (91.3 kg).   Physical Exam Constitutional:      General: She is not in acute distress.    Appearance: She is not diaphoretic.  HENT:  Head: Normocephalic and atraumatic.     Nose: Nose normal.     Mouth/Throat:     Pharynx: No oropharyngeal exudate.  Eyes:     General: No scleral icterus.    Pupils: Pupils are equal, round, and reactive to light.  Cardiovascular:     Rate and Rhythm: Normal rate and regular rhythm.     Heart sounds: No murmur heard. Pulmonary:     Effort: Pulmonary effort is normal. No respiratory distress.     Breath sounds: Normal breath sounds.  Abdominal:     General: Bowel sounds are normal.     Palpations: Abdomen is soft.     Comments: Splenomegaly  Musculoskeletal:        General: Normal range of motion.     Cervical back: Normal range of motion and neck supple.  Skin:    General: Skin is warm and dry.     Findings: No erythema.  Neurological:     Mental Status: She is alert and oriented to person, place, and time.     Cranial Nerves: No cranial nerve deficit.     Motor: No abnormal muscle tone.     Coordination: Coordination normal.  Psychiatric:        Mood and Affect: Affect normal.    Labs    Latest Ref Rng & Units 07/23/2022    2:17 PM 07/08/2022    9:17 AM 04/13/2022   12:56 PM   CBC  WBC 4.0 - 10.5 K/uL 1.9  2.9  2.1   Hemoglobin 12.0 - 15.0 g/dL 11.8  12.7  12.5   Hematocrit 36.0 - 46.0 % 34.9  38.1  35.5   Platelets 150 - 400 K/uL 47  60  41      RADIOGRAPHIC STUDIES: I have personally reviewed the radiological images as listed and agreed with the findings in the report. DG Ankle Complete Right  Result Date: 06/10/2022 CLINICAL DATA:  Fall, ankle pain EXAM: RIGHT ANKLE - COMPLETE 3+ VIEW COMPARISON:  None Available. FINDINGS: Lateral soft tissue swelling. No acute bony abnormality. Specifically, no fracture, subluxation, or dislocation. Joint spaces maintained. IMPRESSION: No acute bony abnormality. Electronically Signed   By: Rolm Baptise M.D.   On: 06/10/2022 18:29   DG Hand Complete Left  Result Date: 06/10/2022 CLINICAL DATA:  Fall, 5th metacarpal pain EXAM: LEFT HAND - COMPLETE 3+ VIEW COMPARISON:  None Available. FINDINGS: Degenerative changes throughout the IP joints with joint space narrowing and spurring. No acute bony abnormality. Specifically, no fracture, subluxation, or dislocation. Soft tissues are intact. IMPRESSION: No acute bony abnormality. Electronically Signed   By: Rolm Baptise M.D.   On: 06/10/2022 18:29    Assessment:  Brandi Bright is a 51 y.o. female with history of liver cirrhosis, hepatitis C, splenomegaly, portal hypertension, chronic pancytopenia present for follow-up.  1. Cirrhosis of liver without ascites, unspecified hepatic cirrhosis type ([place])   2. Thrombocytopenia ([place])   3. Iron deficiency anemia due to chronic blood loss    #History of iron deficiency anemia, hemoglobin 11.8, stable.   # Chronic neutropenia and thrombocytopenia Previous Bone marrow biopsy work-up was negative  Low counts are due to chronic cirrhosis/splenomegaly. Recommend checking CBC prior to future elective procedures.  Recommend Platelet transfusion or TPO [Avatrombopag or Lusutrombopag] if platelet is <50,000    #Vitamin B12 deficiency.  B12  level has improved.  Not on IM B12 injection due to concern of hematoma at injection sites.   Continue sublingual vitamin B-12  lozenges 2000 MCG daily.  #Cirrhosis/splenomegaly/history of varices I encourage patient to continue follow-up with gastroenterology for liver cirrhosis and HCC  Check US abdomen AFP has been followed up and is normal.  Follow up    Lab [facility] CMP AFP, PT, PTT B12]-MD in 6 months  We spent sufficient time to discuss many aspect of care, questions were answered to patient's satisfaction.   Earlie Server, MD, PhD Hematology Oncology [facility] Pager- [phone] 07/25/2022

## 2022-07-29 NOTE — Progress Notes (Unsigned)
Established Patient Office Visit  Subjective:  Patient ID: Brandi Bright, female    DOB: [birth year]  Age: 51 y.o. MRN: [id number]  CC:  Chief Complaint  Patient presents with   Lab Results   HPI:   Patient is here for lab result review.  Patient has history of chronic anemia, thrombocytopenia, neutropenia and hepatitis C related cirrhosis.  Patient is followed by oncologist.   Past Medical History:  Diagnosis Date   Arthritis    Back pain    Chronic knee pain    Cirrhosis ([place])    Gout    Hypothyroidism    IDA (iron deficiency anemia) 01/16/2020   MI, old    Thyroid disease     Past Surgical History:  Procedure Laterality Date   CARDIAC CATHETERIZATION     CESAREAN SECTION     COLONOSCOPY WITH PROPOFOL N/A 03/16/2018   Procedure: COLONOSCOPY WITH PROPOFOL;  Surgeon: Virgel Manifold, MD;  Location: [facility] ENDOSCOPY;  Service: Endoscopy;  Laterality: N/A;   COLONOSCOPY WITH PROPOFOL N/A 07/09/2021   Procedure: COLONOSCOPY WITH PROPOFOL;  Surgeon: Virgel Manifold, MD;  Location: [facility] ENDOSCOPY;  Service: Endoscopy;  Laterality: N/A;   ESOPHAGOGASTRODUODENOSCOPY N/A 10/11/2019   Procedure: ESOPHAGOGASTRODUODENOSCOPY (EGD);  Surgeon: Lin Landsman, MD;  Location: [facility] ENDOSCOPY;  Service: Gastroenterology;  Laterality: N/A;   ESOPHAGOGASTRODUODENOSCOPY (EGD) WITH PROPOFOL N/A 10/27/2018   Procedure: ESOPHAGOGASTRODUODENOSCOPY (EGD) WITH PROPOFOL;  Surgeon: Virgel Manifold, MD;  Location: [facility] ENDOSCOPY;  Service: Endoscopy;  Laterality: N/A;   ESOPHAGOGASTRODUODENOSCOPY (EGD) WITH PROPOFOL N/A 06/25/2019   Procedure: ESOPHAGOGASTRODUODENOSCOPY (EGD) WITH PROPOFOL;  Surgeon: Jonathon Bellows, MD;  Location: [facility] ENDOSCOPY;  Service: Gastroenterology;  Laterality: N/A;   ESOPHAGOGASTRODUODENOSCOPY (EGD) WITH PROPOFOL N/A 09/12/2019   Procedure: ESOPHAGOGASTRODUODENOSCOPY (EGD) WITH PROPOFOL;  Surgeon: Lucilla Lame, MD;  Location: [facility] ENDOSCOPY;  Service: Endoscopy;   Laterality: N/A;   ESOPHAGOGASTRODUODENOSCOPY (EGD) WITH PROPOFOL N/A 11/20/2019   Procedure: ESOPHAGOGASTRODUODENOSCOPY (EGD) WITH PROPOFOL;  Surgeon: Virgel Manifold, MD;  Location: [facility] ENDOSCOPY;  Service: Endoscopy;  Laterality: N/A;   ESOPHAGOGASTRODUODENOSCOPY (EGD) WITH PROPOFOL N/A 11/23/2019   Procedure: ESOPHAGOGASTRODUODENOSCOPY (EGD) WITH PROPOFOL;  Surgeon: Virgel Manifold, MD;  Location: [facility] ENDOSCOPY;  Service: Endoscopy;  Laterality: N/A;   ESOPHAGOGASTRODUODENOSCOPY (EGD) WITH PROPOFOL N/A 01/03/2020   Procedure: ESOPHAGOGASTRODUODENOSCOPY (EGD) WITH PROPOFOL;  Surgeon: Lin Landsman, MD;  Location: [place];  Service: Endoscopy;  Laterality: N/A;   ESOPHAGOGASTRODUODENOSCOPY (EGD) WITH PROPOFOL N/A 07/09/2021   Procedure: ESOPHAGOGASTRODUODENOSCOPY (EGD) WITH PROPOFOL;  Surgeon: Virgel Manifold, MD;  Location: [facility] ENDOSCOPY;  Service: Endoscopy;  Laterality: N/A;   TUBAL LIGATION      Family History  Problem Relation Age of Onset   Hypertension Other    CAD Father    Colon cancer Father    Lung cancer Father    Pancreatic cancer Brother     Social History   Socioeconomic History   Marital status: Single    Spouse name: Not on file   Number of children: Not on file   Years of education: Not on file   Highest education level: Not on file  Occupational History   Not on file  Tobacco Use   Smoking status: Never   Smokeless tobacco: Never  Vaping Use   Vaping Use: Never used  Substance and Sexual Activity   Alcohol use: Yes    Comment: rarely   Drug use: Yes    Comment: prescribed oxycodone and oxycotton   Sexual activity: Not Currently  Other Topics Concern   Not on file  Social History Narrative   Not on file   Social Determinants of Health   Financial Resource Strain: Medium Risk (10/11/2019)   Overall Financial Resource Strain (CARDIA)    Difficulty of Paying Living Expenses: Somewhat hard  Food Insecurity: No Food  Insecurity (10/11/2019)   Hunger Vital Sign    Worried About Running Out of Food in the Last Year: Never true    Ran Out of Food in the Last Year: Never true  Transportation Needs: No Transportation Needs (10/11/2019)   PRAPARE - Hydrologist (Medical): No    Lack of Transportation (Non-Medical): No  Physical Activity: Inactive (10/11/2019)   Exercise Vital Sign    Days of Exercise per Week: 0 days    Minutes of Exercise per Session: 0 min  Stress: Stress Concern Present (10/11/2019)   [place]    Feeling of Stress : Rather much  Social Connections: Unknown (10/11/2019)   Social Connection and Isolation Panel [NHANES]    Frequency of Communication with Friends and Family: More than three times a week    Frequency of Social Gatherings with Friends and Family: More than three times a week    Attends Religious Services: Never    Marine scientist or Organizations: No    Attends Music therapist: Not asked    Marital Status: Not on file  Intimate Partner Violence: Not At Risk (10/11/2019)   Humiliation, Afraid, Rape, and Kick questionnaire    Fear of Current or Ex-Partner: No    Emotionally Abused: No    Physically Abused: No    Sexually Abused: No     Current Outpatient Medications:    ferrous sulfate 325 (65 FE) MG EC tablet, TAKE 1 TABLET BY MOUTH EVERY DAY WITH BREAKFAST, Disp: 30 tablet, Rfl: 2   levothyroxine (SYNTHROID) 150 MCG tablet, TAKE 1 TABLET BY MOUTH DAILY, 30 MINUTES BEFORE A MEAL, Disp: 30 tablet, Rfl: 6   nitroGLYCERIN (NITROLINGUAL) 0.4 MG/SPRAY spray, Place 1 spray under the tongue every 5 (five) minutes x 3 doses as needed for chest pain. (Patient not taking: Reported on 07/23/2022), Disp: 12 g, Rfl: 2   omeprazole (PRILOSEC) 20 MG capsule, Take 1 capsule (20 mg total) by mouth daily., Disp: 90 capsule, Rfl: 3   Oxycodone HCl 10 MG TABS, Take 10 mg by  mouth 4 (four) times daily as needed. (Patient not taking: Reported on 07/23/2022), Disp: , Rfl:    cyanocobalamin ([employer] VITAMIN B12) 1000 MCG tablet, Take 1 tablet (1,000 mcg total) by mouth daily., Disp: 90 tablet, Rfl: 1   OXYCONTIN 10 MG 12 hr tablet, SMARTSIG:1 Tablet(s) By Mouth Every 12 Hours, Disp: , Rfl:    ZTLIDO 1.8 % PTCH, Apply 1 patch topically daily., Disp: , Rfl:    Allergies  Allergen Reactions   Vicodin [Hydrocodone-Acetaminophen] Rash   Amoxicillin Rash and Other (See Comments)    Has patient had a PCN reaction causing immediate rash, facial/tongue/throat swelling, SOB or lightheadedness with hypotension: Yes Has patient had a PCN reaction causing severe rash involving mucus membranes or skin necrosis: No Has patient had a PCN reaction that required hospitalization: No Has patient had a PCN reaction occurring within the last 10 years: No If all of the above answers are "NO", then may proceed with Cephalosporin use.    Gabapentin Rash    ROS Review of Systems  Constitutional: Negative.   HENT: Negative.    Eyes: Negative.   Respiratory:  Negative for chest tightness and shortness of breath.   Cardiovascular:  Negative for chest pain and palpitations.  Gastrointestinal: Negative.   Endocrine: Negative.   Genitourinary: Negative.   Musculoskeletal: Negative.   Skin: Negative.   Allergic/Immunologic: Negative.   Neurological: Negative.   Hematological: Negative.   Psychiatric/Behavioral: Negative.    All other systems reviewed and are negative.     Objective:    Physical Exam Vitals reviewed.  Constitutional:      Appearance: Normal appearance.  HENT:     Mouth/Throat:     Mouth: Mucous membranes are moist.  Eyes:     Conjunctiva/sclera: Conjunctivae normal.     Pupils: Pupils are equal, round, and reactive to light.  Neck:     Vascular: No carotid bruit.  Cardiovascular:     Rate and Rhythm: Normal rate and regular rhythm.     Pulses: Normal pulses.      Heart sounds: Normal heart sounds.  Pulmonary:     Effort: Pulmonary effort is normal.     Breath sounds: Normal breath sounds.  Abdominal:     General: Bowel sounds are normal.     Palpations: Abdomen is soft. There is no hepatomegaly, splenomegaly or mass.     Tenderness: There is no abdominal tenderness.     Hernia: No hernia is present.  Musculoskeletal:        General: No tenderness.     Cervical back: Neck supple.     Right lower leg: No edema.     Left lower leg: No edema.  Skin:    Capillary Refill: Capillary refill takes less than 2 seconds.     Findings: No rash.  Neurological:     General: No focal deficit present.     Mental Status: She is alert and oriented to person, place, and time. Mental status is at baseline.     Motor: No weakness.  Psychiatric:        Mood and Affect: Mood and affect normal.        Behavior: Behavior normal.        Thought Content: Thought content normal.        Judgment: Judgment normal.     BP 137/86   Pulse 71   Ht 4' 9"  (1.448 m)   Wt 201 lb 3.2 oz (91.3 kg)   LMP  (LMP Unknown) Comment: last menstrual 9 years ago, tubal ligation  BMI 43.54 kg/m  Wt Readings from Last 3 Encounters:  07/23/22 201 lb 3.2 oz (91.3 kg)  07/17/22 201 lb 3.2 oz (91.3 kg)  07/08/22 199 lb 1.6 oz (90.3 kg)     Health Maintenance Due  Topic Date Due   COVID-19 Vaccine (1) Never done   TETANUS/TDAP  Never done   Zoster Vaccines- Shingrix (1 of 2) Never done   PAP SMEAR-Modifier  Never done   MAMMOGRAM  Never done    There are no preventive care reminders to display for this patient.  Lab Results  Component Value Date   TSH 0.01 (L) 07/08/2022   Lab Results  Component Value Date   WBC 1.9 (L) 07/23/2022   HGB 11.8 (L) 07/23/2022   HCT 34.9 (L) 07/23/2022   MCV 85.1 07/23/2022   PLT 47 (L) 07/23/2022   Lab Results  Component Value Date   NA 140 07/23/2022   K 4.0 07/23/2022   CO2 26 07/23/2022  GLUCOSE 105 (H) 07/23/2022   BUN  12 07/23/2022   CREATININE 0.42 (L) 07/23/2022   BILITOT 0.8 07/23/2022   ALKPHOS 107 07/23/2022   AST 33 07/23/2022   ALT 19 07/23/2022   PROT 6.4 (L) 07/23/2022   ALBUMIN 3.3 (L) 07/23/2022   CALCIUM 8.8 (L) 07/23/2022   ANIONGAP 3 (L) 07/23/2022   EGFR 111 07/08/2022   GFR 115.91 10/20/2021   Lab Results  Component Value Date   CHOL 118 07/08/2022   Lab Results  Component Value Date   HDL 30 (L) 07/08/2022   Lab Results  Component Value Date   LDLCALC 69 07/08/2022   Lab Results  Component Value Date   TRIG 105 07/08/2022   Lab Results  Component Value Date   CHOLHDL 3.9 07/08/2022   Lab Results  Component Value Date   HGBA1C 4.9 12/29/2017      Assessment & Plan:   Problem List Items Addressed This Visit   None   No orders of the defined types were placed in this encounter.   Follow-up: No follow-ups on file.    Theresia Lo, NP

## 2022-07-31 ENCOUNTER — Encounter: Payer: Self-pay | Admitting: Internal Medicine

## 2022-08-02 ENCOUNTER — Encounter: Payer: Self-pay | Admitting: Nurse Practitioner

## 2022-08-02 DIAGNOSIS — E785 Hyperlipidemia, unspecified: Secondary | ICD-10-CM | POA: Insufficient documentation

## 2022-08-02 NOTE — Assessment & Plan Note (Addendum)
Patient HDL is on the lower side 30. Advised patient to consume

## 2022-08-03 ENCOUNTER — Ambulatory Visit
Admission: RE | Admit: 2022-08-03 | Discharge: 2022-08-03 | Disposition: A | Discharge status: Home / Self Care | Payer: Medicaid Other | Source: Ambulatory Visit | Attending: Oncology | Admitting: Oncology

## 2022-08-03 DIAGNOSIS — K7460 Unspecified cirrhosis of liver: Secondary | ICD-10-CM | POA: Insufficient documentation

## 2022-08-03 DIAGNOSIS — I10 Essential (primary) hypertension: Secondary | ICD-10-CM | POA: Diagnosis not present

## 2022-08-04 ENCOUNTER — Other Ambulatory Visit: Payer: Self-pay | Admitting: *Deleted

## 2022-08-04 MED ORDER — OZEMPIC (0.25 OR 0.5 MG/DOSE) 2 MG/3ML ~~LOC~~ SOPN: 0.2500 mg | PEN_INJECTOR | SUBCUTANEOUS | 4 refills | Status: DC

## 2022-08-04 NOTE — Assessment & Plan Note (Addendum)
Followed by the oncologist.

## 2022-08-04 NOTE — Assessment & Plan Note (Signed)
Body mass index is 43.54 kg/m. Advised pt to lose weight. Advised patient to avoid trans fat, fatty and fried food. Follow a regular physical activity schedule. Went over the risk of chronic diseases with increased weight.

## 2022-08-22 ENCOUNTER — Other Ambulatory Visit: Payer: Self-pay | Admitting: *Deleted

## 2022-08-24 ENCOUNTER — Other Ambulatory Visit: Payer: Self-pay | Admitting: *Deleted

## 2022-08-24 MED ORDER — VICTOZA 18 MG/3ML ~~LOC~~ SOPN
0.6000 mg | PEN_INJECTOR | Freq: Every day | SUBCUTANEOUS | 3 refills | Status: DC
Start: 2022-08-24 — End: 2022-11-16

## 2022-08-24 NOTE — Addendum Note (Signed)
Addended by: Alois Cliche on: 08/24/2022 03:30 PM   Modules accepted: Orders

## 2022-09-02 ENCOUNTER — Other Ambulatory Visit: Payer: Self-pay | Admitting: Gastroenterology

## 2022-09-02 NOTE — Telephone Encounter (Signed)
Ok to send in Rx with 90 day supply and RF5. Thanks.

## 2022-09-02 NOTE — Telephone Encounter (Signed)
Dr Bryan Lemma,   I am sending you this refill request for Dr Ardis Hughs patient as you are DOD this afternoon.    Please advise if OK to refill and if so how many refills.  Thank you, Elmyra Ricks

## 2022-09-10 ENCOUNTER — Other Ambulatory Visit: Payer: Self-pay | Admitting: Gastroenterology

## 2022-09-10 NOTE — Telephone Encounter (Signed)
Good morning Dr Tarri Glenn,  I am sending you an Rx request for a pt of Dr Ardis Hughs as you are DOD am.  Please advise.

## 2022-09-21 ENCOUNTER — Ambulatory Visit (INDEPENDENT_AMBULATORY_CARE_PROVIDER_SITE_OTHER): Payer: Medicaid Other | Admitting: Internal Medicine

## 2022-09-21 ENCOUNTER — Encounter: Payer: Self-pay | Admitting: Internal Medicine

## 2022-09-21 VITALS — BP 132/84 | HR 86 | Ht <= 58 in | Wt 203.6 lb

## 2022-09-21 DIAGNOSIS — I25118 Atherosclerotic heart disease of native coronary artery with other forms of angina pectoris: Secondary | ICD-10-CM

## 2022-09-21 DIAGNOSIS — I10 Essential (primary) hypertension: Secondary | ICD-10-CM

## 2022-09-21 DIAGNOSIS — E785 Hyperlipidemia, unspecified: Secondary | ICD-10-CM

## 2022-09-21 DIAGNOSIS — I8500 Esophageal varices without bleeding: Secondary | ICD-10-CM | POA: Diagnosis not present

## 2022-09-21 DIAGNOSIS — E039 Hypothyroidism, unspecified: Secondary | ICD-10-CM | POA: Diagnosis not present

## 2022-09-21 NOTE — Assessment & Plan Note (Signed)
Patient has atypical chest pain EKG is abnormal so we will do a stress and echo

## 2022-09-21 NOTE — Assessment & Plan Note (Signed)

## 2022-09-21 NOTE — Assessment & Plan Note (Signed)
patient has a history of cirrhosis of liver, she was referred to GI specialist.

## 2022-09-21 NOTE — Assessment & Plan Note (Signed)
Continue taking the thyroid medication in the morning

## 2022-09-21 NOTE — Assessment & Plan Note (Signed)
Hypercholesterolemia  I advised the patient to follow Mediterranean diet This diet is rich in fruits vegetables and whole grain, and This diet is also rich in fish and lean meat Patient should also eat a handful of almonds or walnuts daily Recent heart study indicated that average follow-up on this kind of diet reduces the cardiovascular mortality by 50 to 70%== 

## 2022-09-21 NOTE — Assessment & Plan Note (Signed)

## 2022-09-21 NOTE — Progress Notes (Signed)
Established Patient Office Visit  Subjective:  Patient ID: Brandi Bright, female    DOB: 1971-03-07  Age: 51 y.o. MRN: [id number]  CC:  Chief Complaint  Patient presents with   Breast Pain    Patient having pain in left breast. She states that she has been lifting her sister a lot from her sister being in the hospital and thinks she has made it sore.     Chest Pain  This is a new problem. The current episode started 1 to 4 weeks ago. The onset quality is sudden. The problem occurs daily. The problem has been waxing and waning. The pain is at a severity of 6/10. The pain is moderate. The quality of the pain is described as stabbing. The pain does not radiate. Associated symptoms include claudication, leg pain (both), palpitations and shortness of breath. Pertinent negatives include no back pain, cough, diaphoresis, dizziness, fever, near-syncope, orthopnea or syncope. Risk factors include post-menopausal, sedentary lifestyle and lack of exercise.  Her past medical history is significant for anxiety/panic attacks.    Brandi Bright presents for chest pain  Past Medical History:  Diagnosis Date   Arthritis    Back pain    Chronic Bright pain    Cirrhosis ([place])    Gout    Hypothyroidism    IDA (iron deficiency anemia) 01/16/2020   MI, old    Thyroid disease     Past Surgical History:  Procedure Laterality Date   CARDIAC CATHETERIZATION     CESAREAN SECTION     COLONOSCOPY WITH PROPOFOL N/A 03/16/2018   Procedure: COLONOSCOPY WITH PROPOFOL;  Surgeon: Virgel Manifold, MD;  Location: [facility] ENDOSCOPY;  Service: Endoscopy;  Laterality: N/A;   COLONOSCOPY WITH PROPOFOL N/A 07/09/2021   Procedure: COLONOSCOPY WITH PROPOFOL;  Surgeon: Virgel Manifold, MD;  Location: [facility] ENDOSCOPY;  Service: Endoscopy;  Laterality: N/A;   ESOPHAGOGASTRODUODENOSCOPY N/A 10/11/2019   Procedure: ESOPHAGOGASTRODUODENOSCOPY (EGD);  Surgeon: Lin Landsman, MD;  Location: [facility] ENDOSCOPY;  Service:  Gastroenterology;  Laterality: N/A;   ESOPHAGOGASTRODUODENOSCOPY (EGD) WITH PROPOFOL N/A 10/27/2018   Procedure: ESOPHAGOGASTRODUODENOSCOPY (EGD) WITH PROPOFOL;  Surgeon: Virgel Manifold, MD;  Location: [facility] ENDOSCOPY;  Service: Endoscopy;  Laterality: N/A;   ESOPHAGOGASTRODUODENOSCOPY (EGD) WITH PROPOFOL N/A 06/25/2019   Procedure: ESOPHAGOGASTRODUODENOSCOPY (EGD) WITH PROPOFOL;  Surgeon: Jonathon Bellows, MD;  Location: [facility] ENDOSCOPY;  Service: Gastroenterology;  Laterality: N/A;   ESOPHAGOGASTRODUODENOSCOPY (EGD) WITH PROPOFOL N/A 09/12/2019   Procedure: ESOPHAGOGASTRODUODENOSCOPY (EGD) WITH PROPOFOL;  Surgeon: Lucilla Lame, MD;  Location: [facility] ENDOSCOPY;  Service: Endoscopy;  Laterality: N/A;   ESOPHAGOGASTRODUODENOSCOPY (EGD) WITH PROPOFOL N/A 11/20/2019   Procedure: ESOPHAGOGASTRODUODENOSCOPY (EGD) WITH PROPOFOL;  Surgeon: Virgel Manifold, MD;  Location: [facility] ENDOSCOPY;  Service: Endoscopy;  Laterality: N/A;   ESOPHAGOGASTRODUODENOSCOPY (EGD) WITH PROPOFOL N/A 11/23/2019   Procedure: ESOPHAGOGASTRODUODENOSCOPY (EGD) WITH PROPOFOL;  Surgeon: Virgel Manifold, MD;  Location: [facility] ENDOSCOPY;  Service: Endoscopy;  Laterality: N/A;   ESOPHAGOGASTRODUODENOSCOPY (EGD) WITH PROPOFOL N/A 01/03/2020   Procedure: ESOPHAGOGASTRODUODENOSCOPY (EGD) WITH PROPOFOL;  Surgeon: Lin Landsman, MD;  Location: [place];  Service: Endoscopy;  Laterality: N/A;   ESOPHAGOGASTRODUODENOSCOPY (EGD) WITH PROPOFOL N/A 07/09/2021   Procedure: ESOPHAGOGASTRODUODENOSCOPY (EGD) WITH PROPOFOL;  Surgeon: Virgel Manifold, MD;  Location: [facility] ENDOSCOPY;  Service: Endoscopy;  Laterality: N/A;   TUBAL LIGATION      Family History  Problem Relation Age of Onset   Hypertension Other    CAD Father    Colon cancer Father  Lung cancer Father    Pancreatic cancer Brother     Social History   Socioeconomic History   Marital status: Single    Spouse name: Not on file   Number of children: Not on file    Years of education: Not on file   Highest education level: Not on file  Occupational History   Not on file  Tobacco Use   Smoking status: Never   Smokeless tobacco: Never  Vaping Use   Vaping Use: Never used  Substance and Sexual Activity   Alcohol use: Yes    Comment: rarely   Drug use: Yes    Comment: prescribed oxycodone and oxycotton   Sexual activity: Not Currently  Other Topics Concern   Not on file  Social History Narrative   Not on file   Social Determinants of Health   Financial Resource Strain: Medium Risk (10/11/2019)   Overall Financial Resource Strain (CARDIA)    Difficulty of Paying Living Expenses: Somewhat hard  Food Insecurity: No Food Insecurity (10/11/2019)   Hunger Vital Sign    Worried About Running Out of Food in the Last Year: Never true    Ran Out of Food in the Last Year: Never true  Transportation Needs: No Transportation Needs (10/11/2019)   PRAPARE - Hydrologist (Medical): No    Lack of Transportation (Non-Medical): No  Physical Activity: Inactive (10/11/2019)   Exercise Vital Sign    Days of Exercise per Week: 0 days    Minutes of Exercise per Session: 0 min  Stress: Stress Concern Present (10/11/2019)   [place]    Feeling of Stress : Rather much  Social Connections: Unknown (10/11/2019)   Social Connection and Isolation Panel [NHANES]    Frequency of Communication with Friends and Family: More than three times a week    Frequency of Social Gatherings with Friends and Family: More than three times a week    Attends Religious Services: Never    Marine scientist or Organizations: No    Attends Music therapist: Not asked    Marital Status: Not on file  Intimate Partner Violence: Not At Risk (10/11/2019)   Humiliation, Afraid, Rape, and Kick questionnaire    Fear of Current or Ex-Partner: No    Emotionally Abused: No     Physically Abused: No    Sexually Abused: No     Current Outpatient Medications:    cyanocobalamin ([employer] VITAMIN B12) 1000 MCG tablet, Take 1 tablet (1,000 mcg total) by mouth daily., Disp: 90 tablet, Rfl: 1   ferrous sulfate 325 (65 FE) MG EC tablet, TAKE 1 TABLET BY MOUTH EVERY DAY WITH BREAKFAST, Disp: 30 tablet, Rfl: 2   levothyroxine (SYNTHROID) 150 MCG tablet, TAKE 1 TABLET BY MOUTH DAILY, 30 MINUTES BEFORE A MEAL, Disp: 30 tablet, Rfl: 6   liraglutide (VICTOZA) 18 MG/3ML SOPN, Inject 0.6 mg into the skin daily at 2 PM., Disp: 3 mL, Rfl: 3   nitroGLYCERIN (NITROLINGUAL) 0.4 MG/SPRAY spray, Place 1 spray under the tongue every 5 (five) minutes x 3 doses as needed for chest pain., Disp: 12 g, Rfl: 2   omeprazole (PRILOSEC) 20 MG capsule, TAKE 1 CAPSULE BY MOUTH EVERY DAY 20-30 MINUTES BEFORE BREAKFAST, Disp: 90 capsule, Rfl: 0   Oxycodone HCl 10 MG TABS, Take 10 mg by mouth 4 (four) times daily as needed., Disp: , Rfl:    OXYCONTIN 10 MG 12  hr tablet, SMARTSIG:1 Tablet(s) By Mouth Every 12 Hours, Disp: , Rfl:    ZTLIDO 1.8 % PTCH, Apply 1 patch topically daily., Disp: , Rfl:    Allergies  Allergen Reactions   Vicodin [Hydrocodone-Acetaminophen] Rash   Amoxicillin Rash and Other (See Comments)    Has patient had a PCN reaction causing immediate rash, facial/tongue/throat swelling, SOB or lightheadedness with hypotension: Yes Has patient had a PCN reaction causing severe rash involving mucus membranes or skin necrosis: No Has patient had a PCN reaction that required hospitalization: No Has patient had a PCN reaction occurring within the last 10 years: No If all of the above answers are "NO", then may proceed with Cephalosporin use.    Gabapentin Rash    ROS Review of Systems  Constitutional:  Negative for diaphoresis and fever.  Respiratory:  Positive for shortness of breath. Negative for cough.   Cardiovascular:  Positive for chest pain, palpitations and claudication. Negative for  orthopnea, syncope and near-syncope.  Musculoskeletal:  Negative for back pain.  Neurological:  Negative for dizziness.      Objective:    Physical Exam  BP 132/84   Pulse 86   Ht _0  (1.448 m)   Wt 203 lb 9.6 oz (92.4 kg)   LMP  (LMP Unknown) Comment: last menstrual 9 years ago, tubal ligation  BMI 44.06 kg/m  Wt Readings from Last 3 Encounters:  09/21/22 203 lb 9.6 oz (92.4 kg)  07/23/22 201 lb 3.2 oz (91.3 kg)  07/17/22 201 lb 3.2 oz (91.3 kg)     Health Maintenance Due  Topic Date Due   TETANUS/TDAP  Never done   PAP SMEAR-Modifier  Never done   MAMMOGRAM  Never done    There are no preventive care reminders to display for this patient.  Lab Results  Component Value Date   TSH 0.01 (L) 07/08/2022   Lab Results  Component Value Date   WBC 1.9 (L) 07/23/2022   HGB 11.8 (L) 07/23/2022   HCT 34.9 (L) 07/23/2022   MCV 85.1 07/23/2022   PLT 47 (L) 07/23/2022   Lab Results  Component Value Date   NA 140 07/23/2022   K 4.0 07/23/2022   CO2 26 07/23/2022   GLUCOSE 105 (H) 07/23/2022   BUN 12 07/23/2022   CREATININE 0.42 (L) 07/23/2022   BILITOT 0.8 07/23/2022   ALKPHOS 107 07/23/2022   AST 33 07/23/2022   ALT 19 07/23/2022   PROT 6.4 (L) 07/23/2022   ALBUMIN 3.3 (L) 07/23/2022   CALCIUM 8.8 (L) 07/23/2022   ANIONGAP 3 (L) 07/23/2022   EGFR 111 07/08/2022   GFR 115.91 10/20/2021   Lab Results  Component Value Date   CHOL 118 07/08/2022   Lab Results  Component Value Date   HDL 30 (L) 07/08/2022   Lab Results  Component Value Date   LDLCALC 69 07/08/2022   Lab Results  Component Value Date   TRIG 105 07/08/2022   Lab Results  Component Value Date   CHOLHDL 3.9 07/08/2022   Lab Results  Component Value Date   HGBA1C 4.9 12/29/2017      Assessment & Plan:   Problem List Items Addressed This Visit       Cardiovascular and Mediastinum   Esophageal varices without bleeding ([place])     patient has a history of cirrhosis of liver, she  was referred to GI specialist.      Coronary artery disease - Primary    Patient has atypical chest  pain EKG is abnormal so we will do a stress and echo      Essential hypertension     Patient denies any chest pain or shortness of breath there is no history of palpitation or paroxysmal nocturnal dyspnea   patient was advised to follow low-salt low-cholesterol diet    ideally I want to keep systolic blood pressure below 130 mmHg, patient was asked to check blood pressure one times a week and give me a report on that.  Patient will be follow-up in 3 months  or earlier as needed, patient will call me back for any change in the cardiovascular symptoms Patient was advised to buy a book from local bookstore concerning blood pressure and read several chapters  every day.  This will be supplemented by some of the material we will give him from the office.  Patient should also utilize other resources like YouTube and Internet to learn more about the blood pressure and the diet.        Endocrine   Hypothyroidism    Continue taking the thyroid medication in the morning        Other   Morbid obesity ([place])    - I encouraged the patient to lose weight.  - I educated them on making healthy dietary choices including eating more fruits and vegetables and less fried foods. - I encouraged the patient to exercise more, and educated on the benefits of exercise including weight loss, diabetes prevention, and hypertension prevention.   Dietary counseling with a registered dietician  Referral to a weight management support group (e.g. Weight Watchers, Overeaters Anonymous)  If your BMI is greater than 29 or you have gained more than 15 pounds you should work on weight loss.  Attend a healthy cooking class       Dyslipidemia    Hypercholesterolemia  I advised the patient to follow Mediterranean diet This diet is rich in fruits vegetables and whole grain, and This diet is also rich in fish and lean  meat Patient should also eat a handful of almonds or walnuts daily Recent heart study indicated that average follow-up on this kind of diet reduces the cardiovascular mortality by 50 to 70%==       Report of the electrocardiogram. Normal sinus rhythm right bundle block ST-T abnormalities abnormal EKG   Follow-up: No follow-ups on file.    Cletis [place], MD

## 2022-09-28 ENCOUNTER — Encounter: Payer: Self-pay | Admitting: Internal Medicine

## 2022-09-28 ENCOUNTER — Ambulatory Visit (INDEPENDENT_AMBULATORY_CARE_PROVIDER_SITE_OTHER): Payer: Medicaid Other | Admitting: Internal Medicine

## 2022-09-28 DIAGNOSIS — I25118 Atherosclerotic heart disease of native coronary artery with other forms of angina pectoris: Secondary | ICD-10-CM

## 2022-09-28 DIAGNOSIS — R0789 Other chest pain: Secondary | ICD-10-CM | POA: Diagnosis not present

## 2022-09-28 DIAGNOSIS — I10 Essential (primary) hypertension: Secondary | ICD-10-CM | POA: Diagnosis not present

## 2022-09-28 DIAGNOSIS — E669 Obesity, unspecified: Secondary | ICD-10-CM | POA: Diagnosis not present

## 2022-09-28 NOTE — Progress Notes (Signed)
Established Patient Office Visit  Subjective:  Patient ID: Brandi Bright, female    DOB: 09/04/1971  Age: 51 y.o. MRN: [id number]  CC: No chief complaint on file.   HPI  Brandi Bright presents for chest pain and sob  Past Medical History:  Diagnosis Date   Arthritis    Back pain    Chronic knee pain    Cirrhosis ([place])    Gout    Hypothyroidism    IDA (iron deficiency anemia) 01/16/2020   MI, old    Thyroid disease     Past Surgical History:  Procedure Laterality Date   CARDIAC CATHETERIZATION     CESAREAN SECTION     COLONOSCOPY WITH PROPOFOL N/A 03/16/2018   Procedure: COLONOSCOPY WITH PROPOFOL;  Surgeon: Virgel Manifold, MD;  Location: [facility] ENDOSCOPY;  Service: Endoscopy;  Laterality: N/A;   COLONOSCOPY WITH PROPOFOL N/A 07/09/2021   Procedure: COLONOSCOPY WITH PROPOFOL;  Surgeon: Virgel Manifold, MD;  Location: [facility] ENDOSCOPY;  Service: Endoscopy;  Laterality: N/A;   ESOPHAGOGASTRODUODENOSCOPY N/A 10/11/2019   Procedure: ESOPHAGOGASTRODUODENOSCOPY (EGD);  Surgeon: Lin Landsman, MD;  Location: [facility] ENDOSCOPY;  Service: Gastroenterology;  Laterality: N/A;   ESOPHAGOGASTRODUODENOSCOPY (EGD) WITH PROPOFOL N/A 10/27/2018   Procedure: ESOPHAGOGASTRODUODENOSCOPY (EGD) WITH PROPOFOL;  Surgeon: Virgel Manifold, MD;  Location: [facility] ENDOSCOPY;  Service: Endoscopy;  Laterality: N/A;   ESOPHAGOGASTRODUODENOSCOPY (EGD) WITH PROPOFOL N/A 06/25/2019   Procedure: ESOPHAGOGASTRODUODENOSCOPY (EGD) WITH PROPOFOL;  Surgeon: Jonathon Bellows, MD;  Location: [facility] ENDOSCOPY;  Service: Gastroenterology;  Laterality: N/A;   ESOPHAGOGASTRODUODENOSCOPY (EGD) WITH PROPOFOL N/A 09/12/2019   Procedure: ESOPHAGOGASTRODUODENOSCOPY (EGD) WITH PROPOFOL;  Surgeon: Lucilla Lame, MD;  Location: [facility] ENDOSCOPY;  Service: Endoscopy;  Laterality: N/A;   ESOPHAGOGASTRODUODENOSCOPY (EGD) WITH PROPOFOL N/A 11/20/2019   Procedure: ESOPHAGOGASTRODUODENOSCOPY (EGD) WITH PROPOFOL;  Surgeon: Virgel Manifold, MD;  Location: [facility] ENDOSCOPY;  Service: Endoscopy;  Laterality: N/A;   ESOPHAGOGASTRODUODENOSCOPY (EGD) WITH PROPOFOL N/A 11/23/2019   Procedure: ESOPHAGOGASTRODUODENOSCOPY (EGD) WITH PROPOFOL;  Surgeon: Virgel Manifold, MD;  Location: [facility] ENDOSCOPY;  Service: Endoscopy;  Laterality: N/A;   ESOPHAGOGASTRODUODENOSCOPY (EGD) WITH PROPOFOL N/A 01/03/2020   Procedure: ESOPHAGOGASTRODUODENOSCOPY (EGD) WITH PROPOFOL;  Surgeon: Lin Landsman, MD;  Location: [place];  Service: Endoscopy;  Laterality: N/A;   ESOPHAGOGASTRODUODENOSCOPY (EGD) WITH PROPOFOL N/A 07/09/2021   Procedure: ESOPHAGOGASTRODUODENOSCOPY (EGD) WITH PROPOFOL;  Surgeon: Virgel Manifold, MD;  Location: [facility] ENDOSCOPY;  Service: Endoscopy;  Laterality: N/A;   TUBAL LIGATION      Family History  Problem Relation Age of Onset   Hypertension Other    CAD Father    Colon cancer Father    Lung cancer Father    Pancreatic cancer Brother     Social History   Socioeconomic History   Marital status: Single    Spouse name: Not on file   Number of children: Not on file   Years of education: Not on file   Highest education level: Not on file  Occupational History   Not on file  Tobacco Use   Smoking status: Never   Smokeless tobacco: Never  Vaping Use   Vaping Use: Never used  Substance and Sexual Activity   Alcohol use: Yes    Comment: rarely   Drug use: Yes    Comment: prescribed oxycodone and oxycotton   Sexual activity: Not Currently  Other Topics Concern   Not on file  Social History Narrative   Not on file   Social Determinants of Health  Financial Resource Strain: Medium Risk (10/11/2019)   Overall Financial Resource Strain (CARDIA)    Difficulty of Paying Living Expenses: Somewhat hard  Food Insecurity: No Food Insecurity (10/11/2019)   Hunger Vital Sign    Worried About Running Out of Food in the Last Year: Never true    Ran Out of Food in the Last Year: Never true   Transportation Needs: No Transportation Needs (10/11/2019)   PRAPARE - Transportation    Lack of Transportation (Medical): No    Lack of Transportation (Non-Medical): No  Physical Activity: Inactive (10/11/2019)   Exercise Vital Sign    Days of Exercise per Week: 0 days    Minutes of Exercise per Session: 0 min  Stress: Stress Concern Present (10/11/2019)   [employer] - Occupational Stress Questionnaire    Feeling of Stress : Rather much  Social Connections: Unknown (10/11/2019)   Social Connection and Isolation Panel [NHANES]    Frequency of Communication with Friends and Family: More than three times a week    Frequency of Social Gatherings with Friends and Family: More than three times a week    Attends Religious Services: Never    Active Member of Clubs or Organizations: No    Attends Club or Organization Meetings: Not asked    Marital Status: Not on file  Intimate Partner Violence: Not At Risk (10/11/2019)   Humiliation, Afraid, Rape, and Kick questionnaire    Fear of Current or Ex-Partner: No    Emotionally Abused: No    Physically Abused: No    Sexually Abused: No     Current Outpatient Medications:    cyanocobalamin ([employer] VITAMIN B12) 1000 MCG tablet, Take 1 tablet (1,000 mcg total) by mouth daily., Disp: 90 tablet, Rfl: 1   ferrous sulfate 325 (65 FE) MG EC tablet, TAKE 1 TABLET BY MOUTH EVERY DAY WITH BREAKFAST, Disp: 30 tablet, Rfl: 2   levothyroxine (SYNTHROID) 150 MCG tablet, TAKE 1 TABLET BY MOUTH DAILY, 30 MINUTES BEFORE A MEAL, Disp: 30 tablet, Rfl: 6   liraglutide (VICTOZA) 18 MG/3ML SOPN, Inject 0.6 mg into the skin daily at 2 PM., Disp: 3 mL, Rfl: 3   nitroGLYCERIN (NITROLINGUAL) 0.4 MG/SPRAY spray, Place 1 spray under the tongue every 5 (five) minutes x 3 doses as needed for chest pain., Disp: 12 g, Rfl: 2   omeprazole (PRILOSEC) 20 MG capsule, TAKE 1 CAPSULE BY MOUTH EVERY DAY 20-30 MINUTES BEFORE BREAKFAST, Disp: 90 capsule, Rfl:  0   Oxycodone HCl 10 MG TABS, Take 10 mg by mouth 4 (four) times daily as needed., Disp: , Rfl:    OXYCONTIN 10 MG 12 hr tablet, SMARTSIG:1 Tablet(s) By Mouth Every 12 Hours, Disp: , Rfl:    ZTLIDO 1.8 % PTCH, Apply 1 patch topically daily., Disp: , Rfl:    Allergies  Allergen Reactions   Vicodin [Hydrocodone-Acetaminophen] Rash   Amoxicillin Rash and Other (See Comments)    Has patient had a PCN reaction causing immediate rash, facial/tongue/throat swelling, SOB or lightheadedness with hypotension: Yes Has patient had a PCN reaction causing severe rash involving mucus membranes or skin necrosis: No Has patient had a PCN reaction that required hospitalization: No Has patient had a PCN reaction occurring within the last 10 years: No If all of the above answers are "NO", then may proceed with Cephalosporin use.    Gabapentin Rash    ROS Review of Systems  Constitutional: Negative.   HENT: Negative.    Eyes: Negative.     Respiratory:  Positive for shortness of breath.   Cardiovascular:  Positive for chest pain.  Gastrointestinal: Negative.   Endocrine: Negative.   Genitourinary: Negative.   Musculoskeletal: Negative.   Skin: Negative.   Allergic/Immunologic: Negative.   Neurological: Negative.   Hematological: Negative.   Psychiatric/Behavioral: Negative.    All other systems reviewed and are negative.     Objective:    Physical Exam Vitals reviewed.  Constitutional:      Appearance: Normal appearance. She is obese.  HENT:     Mouth/Throat:     Mouth: Mucous membranes are moist.  Eyes:     Pupils: Pupils are equal, round, and reactive to light.  Neck:     Vascular: No carotid bruit.  Cardiovascular:     Rate and Rhythm: Normal rate and regular rhythm.     Pulses: Normal pulses.     Heart sounds: Normal heart sounds.  Pulmonary:     Effort: Pulmonary effort is normal.     Breath sounds: Normal breath sounds.  Abdominal:     General: Bowel sounds are normal.      Palpations: Abdomen is soft. There is no hepatomegaly, splenomegaly or mass.     Tenderness: There is no abdominal tenderness.     Hernia: No hernia is present.  Musculoskeletal:        General: No tenderness.     Cervical back: Neck supple.     Right lower leg: No edema.     Left lower leg: No edema.  Skin:    Findings: No rash.  Neurological:     Mental Status: She is alert and oriented to person, place, and time.     Motor: No weakness.  Psychiatric:        Mood and Affect: Mood and affect normal.        Behavior: Behavior normal.     LMP  (LMP Unknown) Comment: last menstrual 9 years ago, tubal ligation Wt Readings from Last 3 Encounters:  09/21/22 203 lb 9.6 oz (92.4 kg)  07/23/22 201 lb 3.2 oz (91.3 kg)  07/17/22 201 lb 3.2 oz (91.3 kg)     Health Maintenance Due  Topic Date Due   TETANUS/TDAP  Never done   PAP SMEAR-Modifier  Never done   MAMMOGRAM  Never done    There are no preventive care reminders to display for this patient.  Lab Results  Component Value Date   TSH 0.01 (L) 07/08/2022   Lab Results  Component Value Date   WBC 1.9 (L) 07/23/2022   HGB 11.8 (L) 07/23/2022   HCT 34.9 (L) 07/23/2022   MCV 85.1 07/23/2022   PLT 47 (L) 07/23/2022   Lab Results  Component Value Date   NA 140 07/23/2022   K 4.0 07/23/2022   CO2 26 07/23/2022   GLUCOSE 105 (H) 07/23/2022   BUN 12 07/23/2022   CREATININE 0.42 (L) 07/23/2022   BILITOT 0.8 07/23/2022   ALKPHOS 107 07/23/2022   AST 33 07/23/2022   ALT 19 07/23/2022   PROT 6.4 (L) 07/23/2022   ALBUMIN 3.3 (L) 07/23/2022   CALCIUM 8.8 (L) 07/23/2022   ANIONGAP 3 (L) 07/23/2022   EGFR 111 07/08/2022   GFR 115.91 10/20/2021   Lab Results  Component Value Date   CHOL 118 07/08/2022   Lab Results  Component Value Date   HDL 30 (L) 07/08/2022   Lab Results  Component Value Date   LDLCALC 69 07/08/2022   Lab Results  Component Value  Date   TRIG 105 07/08/2022   Lab Results  Component Value  Date   CHOLHDL 3.9 07/08/2022   Lab Results  Component Value Date   HGBA1C 4.9 12/29/2017      Assessment & Plan:   Problem List Items Addressed This Visit       Cardiovascular and Mediastinum   Coronary artery disease    Patient has atypical chest pain not suggestive of angina      Essential hypertension     Patient denies any chest pain or shortness of breath there is no history of palpitation or paroxysmal nocturnal dyspnea   patient was advised to follow low-salt low-cholesterol diet    ideally I want to keep systolic blood pressure below 130 mmHg, patient was asked to check blood pressure one times a week and give me a report on that.  Patient will be follow-up in 3 months  or earlier as needed, patient will call me back for any change in the cardiovascular symptoms Patient was advised to buy a book from local bookstore concerning blood pressure and read several chapters  every day.  This will be supplemented by some of the material we will give him from the office.  Patient should also utilize other resources like YouTube and Internet to learn more about the blood pressure and the diet.        Other   Chest wall pain - Primary    We will get echo and a stress test      Obesity (BMI 35.0-39.9 without comorbidity)    - I encouraged the patient to lose weight.  - I educated them on making healthy dietary choices including eating more fruits and vegetables and less fried foods. - I encouraged the patient to exercise more, and educated on the benefits of exercise including weight loss, diabetes prevention, and hypertension prevention.   Dietary counseling with a registered dietician  Referral to a weight management support group (e.g. Weight Watchers, Overeaters Anonymous)  If your BMI is greater than 29 or you have gained more than 15 pounds you should work on weight loss.  Attend a healthy cooking class      [facility] [address] [place],  [place] [address] Phone: [phone] Fax:  [phone]  Transthoracic Echocardiogram Note  VERNON ARIEL [id number] 1971-04-25  Procedure: Transthoracic Echocardiogram Indications: Chest pain Verbal Consent: Obtained  Procedure Details   Technical quality: good  Resting Measurements: Within normal range  Left Ventrical: Within normal limits  Mitral Valve: Slightly thickened  Aortic Valve: Thickened with good opening      Pulmonic Valve: Not seen  Left Atrium/ Left atrial appendage: Within normal limits no blood clots  Atrial septum:   Aorta: normal  Sclerotic complications: No apparent complications Patient did tolerate procedure well.  Cletis Athens, MD   Follow-up: No follow-ups on file.    Cletis Athens, MD

## 2022-09-28 NOTE — Assessment & Plan Note (Signed)
Patient has atypical chest pain not suggestive of angina

## 2022-09-28 NOTE — Assessment & Plan Note (Signed)
We will get echo and a stress test

## 2022-09-28 NOTE — Assessment & Plan Note (Signed)

## 2022-09-28 NOTE — Assessment & Plan Note (Signed)

## 2022-09-30 ENCOUNTER — Ambulatory Visit (INDEPENDENT_AMBULATORY_CARE_PROVIDER_SITE_OTHER): Payer: Medicaid Other | Admitting: Internal Medicine

## 2022-09-30 ENCOUNTER — Other Ambulatory Visit: Payer: Self-pay | Admitting: Gastroenterology

## 2022-09-30 DIAGNOSIS — I25118 Atherosclerotic heart disease of native coronary artery with other forms of angina pectoris: Secondary | ICD-10-CM | POA: Diagnosis not present

## 2022-09-30 DIAGNOSIS — I2089 Other forms of angina pectoris: Secondary | ICD-10-CM | POA: Diagnosis not present

## 2022-09-30 DIAGNOSIS — I10 Essential (primary) hypertension: Secondary | ICD-10-CM

## 2022-09-30 DIAGNOSIS — K7469 Other cirrhosis of liver: Secondary | ICD-10-CM

## 2022-09-30 DIAGNOSIS — D2339 Other benign neoplasm of skin of other parts of face: Secondary | ICD-10-CM

## 2022-09-30 MED ORDER — OMEPRAZOLE 20 MG PO CPDR: 20.0000 mg | DELAYED_RELEASE_CAPSULE | Freq: Every day | ORAL | 0 refills | Status: DC

## 2022-09-30 NOTE — Assessment & Plan Note (Signed)
Patient does not drink anymore. 

## 2022-09-30 NOTE — Assessment & Plan Note (Signed)
We will do a limited stress test

## 2022-09-30 NOTE — Assessment & Plan Note (Signed)
Stable

## 2022-09-30 NOTE — Progress Notes (Signed)
Established Patient Office Visit  Subjective:  Patient ID: Brandi Bright, female    DOB: Mar 03, 1971  Age: 51 y.o. MRN: [id number]  CC: No chief complaint on file.   HPI  Brandi Bright presents for abnormal EKG shortness of breath atypical chest pain stable  Past Medical History:  Diagnosis Date   Arthritis    Back pain    Chronic knee pain    Cirrhosis ([place])    Gout    Hypothyroidism    IDA (iron deficiency anemia) 01/16/2020   MI, old    Thyroid disease     Past Surgical History:  Procedure Laterality Date   CARDIAC CATHETERIZATION     CESAREAN SECTION     COLONOSCOPY WITH PROPOFOL N/A 03/16/2018   Procedure: COLONOSCOPY WITH PROPOFOL;  Surgeon: Virgel Manifold, MD;  Location: [facility] ENDOSCOPY;  Service: Endoscopy;  Laterality: N/A;   COLONOSCOPY WITH PROPOFOL N/A 07/09/2021   Procedure: COLONOSCOPY WITH PROPOFOL;  Surgeon: Virgel Manifold, MD;  Location: [facility] ENDOSCOPY;  Service: Endoscopy;  Laterality: N/A;   ESOPHAGOGASTRODUODENOSCOPY N/A 10/11/2019   Procedure: ESOPHAGOGASTRODUODENOSCOPY (EGD);  Surgeon: Lin Landsman, MD;  Location[facility] ENDOSCOPY;  Service: Gastroenterology;  Laterality: N/A;   ESOPHAGOGASTRODUODENOSCOPY (EGD) WITH PROPOFOL N/A 10/27/2018   Procedure: ESOPHAGOGASTRODUODENOSCOPY (EGD) WITH PROPOFOL;  Surgeon: Virgel Manifold, MD;  Location: [facility] ENDOSCOPY;  Service: Endoscopy;  Laterality: N/A;   ESOPHAGOGASTRODUODENOSCOPY (EGD) WITH PROPOFOL N/A 06/25/2019   Procedure: ESOPHAGOGASTRODUODENOSCOPY (EGD) WITH PROPOFOL;  Surgeon: Jonathon Bellows, MD;  Location: [facility] ENDOSCOPY;  Service: Gastroenterology;  Laterality: N/A;   ESOPHAGOGASTRODUODENOSCOPY (EGD) WITH PROPOFOL N/A 09/12/2019   Procedure: ESOPHAGOGASTRODUODENOSCOPY (EGD) WITH PROPOFOL;  Surgeon: Lucilla Lame, MD;  Location: [facility] ENDOSCOPY;  Service: Endoscopy;  Laterality: N/A;   ESOPHAGOGASTRODUODENOSCOPY (EGD) WITH PROPOFOL N/A 11/20/2019   Procedure: ESOPHAGOGASTRODUODENOSCOPY (EGD)  WITH PROPOFOL;  Surgeon: Virgel Manifold, MD;  Location: [facility] ENDOSCOPY;  Service: Endoscopy;  Laterality: N/A;   ESOPHAGOGASTRODUODENOSCOPY (EGD) WITH PROPOFOL N/A 11/23/2019   Procedure: ESOPHAGOGASTRODUODENOSCOPY (EGD) WITH PROPOFOL;  Surgeon: Virgel Manifold, MD;  Location: [facility] ENDOSCOPY;  Service: Endoscopy;  Laterality: N/A;   ESOPHAGOGASTRODUODENOSCOPY (EGD) WITH PROPOFOL N/A 01/03/2020   Procedure: ESOPHAGOGASTRODUODENOSCOPY (EGD) WITH PROPOFOL;  Surgeon: Lin Landsman, MD;  Location: [place];  Service: Endoscopy;  Laterality: N/A;   ESOPHAGOGASTRODUODENOSCOPY (EGD) WITH PROPOFOL N/A 07/09/2021   Procedure: ESOPHAGOGASTRODUODENOSCOPY (EGD) WITH PROPOFOL;  Surgeon: Virgel Manifold, MD;  Location: [facility] ENDOSCOPY;  Service: Endoscopy;  Laterality: N/A;   TUBAL LIGATION      Family History  Problem Relation Age of Onset   Hypertension Other    CAD Father    Colon cancer Father    Lung cancer Father    Pancreatic cancer Brother     Social History   Socioeconomic History   Marital status: Single    Spouse name: Not on file   Number of children: Not on file   Years of education: Not on file   Highest education level: Not on file  Occupational History   Not on file  Tobacco Use   Smoking status: Never   Smokeless tobacco: Never  Vaping Use   Vaping Use: Never used  Substance and Sexual Activity   Alcohol use: Yes    Comment: rarely   Drug use: Yes    Comment: prescribed oxycodone and oxycotton   Sexual activity: Not Currently  Other Topics Concern   Not on file  Social History Narrative   Not on file   Social  Determinants of Health   Financial Resource Strain: Medium Risk (10/11/2019)   Overall Financial Resource Strain (CARDIA)    Difficulty of Paying Living Expenses: Somewhat hard  Food Insecurity: No Food Insecurity (10/11/2019)   Hunger Vital Sign    Worried About Running Out of Food in the Last Year: Never true    Ran Out of Food in  the Last Year: Never true  Transportation Needs: No Transportation Needs (10/11/2019)   PRAPARE - Hydrologist (Medical): No    Lack of Transportation (Non-Medical): No  Physical Activity: Inactive (10/11/2019)   Exercise Vital Sign    Days of Exercise per Week: 0 days    Minutes of Exercise per Session: 0 min  Stress: Stress Concern Present (10/11/2019)   [place]    Feeling of Stress : Rather much  Social Connections: Unknown (10/11/2019)   Social Connection and Isolation Panel [NHANES]    Frequency of Communication with Friends and Family: More than three times a week    Frequency of Social Gatherings with Friends and Family: More than three times a week    Attends Religious Services: Never    Marine scientist or Organizations: No    Attends Music therapist: Not asked    Marital Status: Not on file  Intimate Partner Violence: Not At Risk (10/11/2019)   Humiliation, Afraid, Rape, and Kick questionnaire    Fear of Current or Ex-Partner: No    Emotionally Abused: No    Physically Abused: No    Sexually Abused: No     Current Outpatient Medications:    cyanocobalamin ([employer] VITAMIN B12) 1000 MCG tablet, Take 1 tablet (1,000 mcg total) by mouth daily., Disp: 90 tablet, Rfl: 1   ferrous sulfate 325 (65 FE) MG EC tablet, TAKE 1 TABLET BY MOUTH EVERY DAY WITH BREAKFAST, Disp: 30 tablet, Rfl: 2   levothyroxine (SYNTHROID) 150 MCG tablet, TAKE 1 TABLET BY MOUTH DAILY, 30 MINUTES BEFORE A MEAL, Disp: 30 tablet, Rfl: 6   liraglutide (VICTOZA) 18 MG/3ML SOPN, Inject 0.6 mg into the skin daily at 2 PM., Disp: 3 mL, Rfl: 3   nitroGLYCERIN (NITROLINGUAL) 0.4 MG/SPRAY spray, Place 1 spray under the tongue every 5 (five) minutes x 3 doses as needed for chest pain., Disp: 12 g, Rfl: 2   omeprazole (PRILOSEC) 20 MG capsule, TAKE 1 CAPSULE BY MOUTH EVERY DAY 20-30 MINUTES BEFORE  BREAKFAST, Disp: 90 capsule, Rfl: 0   Oxycodone HCl 10 MG TABS, Take 10 mg by mouth 4 (four) times daily as needed., Disp: , Rfl:    OXYCONTIN 10 MG 12 hr tablet, SMARTSIG:1 Tablet(s) By Mouth Every 12 Hours, Disp: , Rfl:    ZTLIDO 1.8 % PTCH, Apply 1 patch topically daily., Disp: , Rfl:    Allergies  Allergen Reactions   Vicodin [Hydrocodone-Acetaminophen] Rash   Amoxicillin Rash and Other (See Comments)    Has patient had a PCN reaction causing immediate rash, facial/tongue/throat swelling, SOB or lightheadedness with hypotension: Yes Has patient had a PCN reaction causing severe rash involving mucus membranes or skin necrosis: No Has patient had a PCN reaction that required hospitalization: No Has patient had a PCN reaction occurring within the last 10 years: No If all of the above answers are "NO", then may proceed with Cephalosporin use.    Gabapentin Rash    ROS Review of Systems  Constitutional: Negative.   HENT: Negative.  Eyes: Negative.   Respiratory: Negative.    Cardiovascular: Negative.   Gastrointestinal: Negative.   Endocrine: Negative.   Genitourinary: Negative.   Musculoskeletal: Negative.   Skin: Negative.   Allergic/Immunologic: Negative.   Neurological: Negative.   Hematological: Negative.   Psychiatric/Behavioral: Negative.    All other systems reviewed and are negative.     Objective:    Physical Exam Vitals reviewed.  Constitutional:      Appearance: Normal appearance.  HENT:     Mouth/Throat:     Mouth: Mucous membranes are moist.  Eyes:     Pupils: Pupils are equal, round, and reactive to light.  Neck:     Vascular: No carotid bruit.  Cardiovascular:     Rate and Rhythm: Normal rate and regular rhythm.     Pulses: Normal pulses.     Heart sounds: Normal heart sounds.  Pulmonary:     Effort: Pulmonary effort is normal.     Breath sounds: Normal breath sounds.  Abdominal:     General: Bowel sounds are normal.     Palpations: Abdomen  is soft. There is no hepatomegaly, splenomegaly or mass.     Tenderness: There is no abdominal tenderness.     Hernia: No hernia is present.  Musculoskeletal:        General: No tenderness.     Cervical back: Neck supple.     Right lower leg: No edema.     Left lower leg: No edema.  Skin:    Findings: No rash.  Neurological:     Mental Status: She is alert and oriented to person, place, and time.     Motor: No weakness.  Psychiatric:        Mood and Affect: Mood and affect normal.        Behavior: Behavior normal.     LMP  (LMP Unknown) Comment: last menstrual 9 years ago, tubal ligation Wt Readings from Last 3 Encounters:  09/21/22 203 lb 9.6 oz (92.4 kg)  07/23/22 201 lb 3.2 oz (91.3 kg)  07/17/22 201 lb 3.2 oz (91.3 kg)     Health Maintenance Due  Topic Date Due   TETANUS/TDAP  Never done   PAP SMEAR-Modifier  Never done   MAMMOGRAM  Never done    There are no preventive care reminders to display for this patient.  Lab Results  Component Value Date   TSH 0.01 (L) 07/08/2022   Lab Results  Component Value Date   WBC 1.9 (L) 07/23/2022   HGB 11.8 (L) 07/23/2022   HCT 34.9 (L) 07/23/2022   MCV 85.1 07/23/2022   PLT 47 (L) 07/23/2022   Lab Results  Component Value Date   NA 140 07/23/2022   K 4.0 07/23/2022   CO2 26 07/23/2022   GLUCOSE 105 (H) 07/23/2022   BUN 12 07/23/2022   CREATININE 0.42 (L) 07/23/2022   BILITOT 0.8 07/23/2022   ALKPHOS 107 07/23/2022   AST 33 07/23/2022   ALT 19 07/23/2022   PROT 6.4 (L) 07/23/2022   ALBUMIN 3.3 (L) 07/23/2022   CALCIUM 8.8 (L) 07/23/2022   ANIONGAP 3 (L) 07/23/2022   EGFR 111 07/08/2022   GFR 115.91 10/20/2021   Lab Results  Component Value Date   CHOL 118 07/08/2022   Lab Results  Component Value Date   HDL 30 (L) 07/08/2022   Lab Results  Component Value Date   LDLCALC 69 07/08/2022   Lab Results  Component Value Date   TRIG 105 07/08/2022  Lab Results  Component Value Date   CHOLHDL 3.9  07/08/2022   Lab Results  Component Value Date   HGBA1C 4.9 12/29/2017      Assessment & Plan:   Problem List Items Addressed This Visit       Cardiovascular and Mediastinum   Coronary artery disease    We will do a limited stress test      Essential hypertension    Blood pressure under control      Angina of effort - Primary    Stable        Digestive   Other cirrhosis of liver ([place])    Patient does not drink anymore      Report of the stress test. Indication for the stress test chest pain coronary artery disease.  Patient was exercised according to modified Bruce protocol at the speed 1.7 miles an hour 10% grade.  Exercise continued on the same speed .    after 10 minutes due to shortness of breath tiredness   she did not have any chest pain no significant ST changes were noted suggestive of myocardial ischemia patient did not have any angina during or after the stress test.  Blood pressure at the peak exercise was 160/100.  Patient was started back on Cozaar 25 mg p.o. daily.  She was advised to lose weight.  She will be also referred to dermatologist as requested by her.  I  Dictated by Dr. Lavera Guise  No orders of the defined types were placed in this encounter.   Follow-up: No follow-ups on file.    Cletis Athens, MD

## 2022-09-30 NOTE — Assessment & Plan Note (Signed)
Blood pressure under control 

## 2022-10-01 MED ORDER — CARVEDILOL 25 MG PO TABS: 25.0000 mg | ORAL_TABLET | Freq: Every day | ORAL | 3 refills | Status: DC

## 2022-10-01 NOTE — Addendum Note (Signed)
Addended by: Alois Cliche on: 10/01/2022 12:18 PM   Modules accepted: Orders

## 2022-10-01 NOTE — Addendum Note (Signed)
Addended by: Alois Cliche on: 10/01/2022 02:40 PM   Modules accepted: Orders

## 2022-10-14 DIAGNOSIS — G894 Chronic pain syndrome: Secondary | ICD-10-CM | POA: Diagnosis not present

## 2022-10-14 DIAGNOSIS — M542 Cervicalgia: Secondary | ICD-10-CM | POA: Diagnosis not present

## 2022-10-14 DIAGNOSIS — Z79891 Long term (current) use of opiate analgesic: Secondary | ICD-10-CM | POA: Diagnosis not present

## 2022-10-14 DIAGNOSIS — Z79899 Other long term (current) drug therapy: Secondary | ICD-10-CM | POA: Diagnosis not present

## 2022-10-26 ENCOUNTER — Encounter: Payer: Self-pay | Admitting: Oncology

## 2022-10-26 ENCOUNTER — Other Ambulatory Visit (HOSPITAL_COMMUNITY): Payer: Self-pay

## 2022-10-26 ENCOUNTER — Telehealth: Payer: Self-pay

## 2022-10-26 NOTE — Telephone Encounter (Signed)
Pharmacy Patient Advocate Encounter   Received notification from [facility] that prior authorization for Victoza '18mg'$ /75m is required/requested.    PA submitted on 10/26/22 to [employer] [place] via CoverMyMeds  Key [id number] Case ID: [id number] Status is pending

## 2022-10-27 NOTE — Telephone Encounter (Signed)
Pharmacy Patient Advocate Encounter  Received notification from [facility] that the request for prior authorization for Victoza '18MG'$ /3ML pen-injectors has been denied due to .     This determination is currently being reviewed for appeal.    Specialty Pharmacy Patient Advocate Fax:  [phone]

## 2022-11-02 ENCOUNTER — Ambulatory Visit: Payer: Medicaid Other | Admitting: Internal Medicine

## 2022-11-16 ENCOUNTER — Encounter: Payer: Self-pay | Admitting: Internal Medicine

## 2022-11-16 ENCOUNTER — Ambulatory Visit (INDEPENDENT_AMBULATORY_CARE_PROVIDER_SITE_OTHER): Payer: Medicaid Other | Admitting: Internal Medicine

## 2022-11-16 VITALS — BP 131/81 | HR 68 | Temp 97.6°F | Ht <= 58 in | Wt 205.6 lb

## 2022-11-16 DIAGNOSIS — D696 Thrombocytopenia, unspecified: Secondary | ICD-10-CM

## 2022-11-16 DIAGNOSIS — E669 Obesity, unspecified: Secondary | ICD-10-CM

## 2022-11-16 DIAGNOSIS — K7469 Other cirrhosis of liver: Secondary | ICD-10-CM | POA: Diagnosis not present

## 2022-11-16 DIAGNOSIS — F32A Depression, unspecified: Secondary | ICD-10-CM | POA: Diagnosis not present

## 2022-11-16 DIAGNOSIS — F419 Anxiety disorder, unspecified: Secondary | ICD-10-CM | POA: Diagnosis not present

## 2022-11-16 DIAGNOSIS — R519 Headache, unspecified: Secondary | ICD-10-CM

## 2022-11-16 DIAGNOSIS — I10 Essential (primary) hypertension: Secondary | ICD-10-CM

## 2022-11-16 DIAGNOSIS — I25118 Atherosclerotic heart disease of native coronary artery with other forms of angina pectoris: Secondary | ICD-10-CM

## 2022-11-16 DIAGNOSIS — R059 Cough, unspecified: Secondary | ICD-10-CM | POA: Diagnosis not present

## 2022-11-16 LAB — POC COVID19 BINAXNOW: SARS Coronavirus 2 Ag: NEGATIVE

## 2022-11-16 MED ORDER — AZITHROMYCIN 250 MG PO TABS: ORAL_TABLET | ORAL | 0 refills | Status: AC

## 2022-11-16 NOTE — Assessment & Plan Note (Signed)
-   Patient experiencing high levels of anxiety.  - Encouraged patient to engage in relaxing activities like yoga, meditation, journaling, going for a walk, or participating in a hobby.  - Encouraged patient to reach out to trusted friends or family members about recent struggles, Patient was advised to read A book, how to stop worrying and start living, it is good book to read to control  the stress  

## 2022-11-16 NOTE — Assessment & Plan Note (Signed)

## 2022-11-16 NOTE — Assessment & Plan Note (Signed)

## 2022-11-16 NOTE — Assessment & Plan Note (Signed)
Needs regular follow-up

## 2022-11-16 NOTE — Assessment & Plan Note (Signed)
Refer to GI 

## 2022-11-16 NOTE — Progress Notes (Signed)
Established Patient Office Visit  Subjective:  Patient ID: Brandi Bright, female    DOB: 1971-01-16  Age: 51 y.o. MRN: [id number]  CC:  Chief Complaint  Patient presents with   Follow-up    1 month fu. Her chest pain had gotten better but now she has a cough and its causing it to hurt again. Headache and sore throat.     HPI  Brandi Bright presents for cough  Past Medical History:  Diagnosis Date   Arthritis    Back pain    Chronic knee pain    Cirrhosis ([place])    Gout    Hypothyroidism    IDA (iron deficiency anemia) 01/16/2020   MI, old    Thyroid disease     Past Surgical History:  Procedure Laterality Date   CARDIAC CATHETERIZATION     CESAREAN SECTION     COLONOSCOPY WITH PROPOFOL N/A 03/16/2018   Procedure: COLONOSCOPY WITH PROPOFOL;  Surgeon: Virgel Manifold, MD;  Location: [facility] ENDOSCOPY;  Service: Endoscopy;  Laterality: N/A;   COLONOSCOPY WITH PROPOFOL N/A 07/09/2021   Procedure: COLONOSCOPY WITH PROPOFOL;  Surgeon: Virgel Manifold, MD;  Location: [facility] ENDOSCOPY;  Service: Endoscopy;  Laterality: N/A;   ESOPHAGOGASTRODUODENOSCOPY N/A 10/11/2019   Procedure: ESOPHAGOGASTRODUODENOSCOPY (EGD);  Surgeon: Lin Landsman, MD;  Location: [facility] ENDOSCOPY;  Service: Gastroenterology;  Laterality: N/A;   ESOPHAGOGASTRODUODENOSCOPY (EGD) WITH PROPOFOL N/A 10/27/2018   Procedure: ESOPHAGOGASTRODUODENOSCOPY (EGD) WITH PROPOFOL;  Surgeon: Virgel Manifold, MD;  Location: [facility] ENDOSCOPY;  Service: Endoscopy;  Laterality: N/A;   ESOPHAGOGASTRODUODENOSCOPY (EGD) WITH PROPOFOL N/A 06/25/2019   Procedure: ESOPHAGOGASTRODUODENOSCOPY (EGD) WITH PROPOFOL;  Surgeon: Jonathon Bellows, MD;  Location: [facility] ENDOSCOPY;  Service: Gastroenterology;  Laterality: N/A;   ESOPHAGOGASTRODUODENOSCOPY (EGD) WITH PROPOFOL N/A 09/12/2019   Procedure: ESOPHAGOGASTRODUODENOSCOPY (EGD) WITH PROPOFOL;  Surgeon: Lucilla Lame, MD;  Location: [facility] ENDOSCOPY;  Service: Endoscopy;  Laterality: N/A;    ESOPHAGOGASTRODUODENOSCOPY (EGD) WITH PROPOFOL N/A 11/20/2019   Procedure: ESOPHAGOGASTRODUODENOSCOPY (EGD) WITH PROPOFOL;  Surgeon: Virgel Manifold, MD;  Location: [facility] ENDOSCOPY;  Service: Endoscopy;  Laterality: N/A;   ESOPHAGOGASTRODUODENOSCOPY (EGD) WITH PROPOFOL N/A 11/23/2019   Procedure: ESOPHAGOGASTRODUODENOSCOPY (EGD) WITH PROPOFOL;  Surgeon: Virgel Manifold, MD;  Location: [facility] ENDOSCOPY;  Service: Endoscopy;  Laterality: N/A;   ESOPHAGOGASTRODUODENOSCOPY (EGD) WITH PROPOFOL N/A 01/03/2020   Procedure: ESOPHAGOGASTRODUODENOSCOPY (EGD) WITH PROPOFOL;  Surgeon: Lin Landsman, MD;  Location: [place];  Service: Endoscopy;  Laterality: N/A;   ESOPHAGOGASTRODUODENOSCOPY (EGD) WITH PROPOFOL N/A 07/09/2021   Procedure: ESOPHAGOGASTRODUODENOSCOPY (EGD) WITH PROPOFOL;  Surgeon: Virgel Manifold, MD;  Location: [facility] ENDOSCOPY;  Service: Endoscopy;  Laterality: N/A;   TUBAL LIGATION      Family History  Problem Relation Age of Onset   Hypertension Other    CAD Father    Colon cancer Father    Lung cancer Father    Pancreatic cancer Brother     Social History   Socioeconomic History   Marital status: Single    Spouse name: Not on file   Number of children: Not on file   Years of education: Not on file   Highest education level: Not on file  Occupational History   Not on file  Tobacco Use   Smoking status: Never   Smokeless tobacco: Never  Vaping Use   Vaping Use: Never used  Substance and Sexual Activity   Alcohol use: Yes    Comment: rarely   Drug use: Yes    Comment: prescribed oxycodone and  oxycotton   Sexual activity: Not Currently  Other Topics Concern   Not on file  Social History Narrative   Not on file   Social Determinants of Health   Financial Resource Strain: Medium Risk (10/11/2019)   Overall Financial Resource Strain (CARDIA)    Difficulty of Paying Living Expenses: Somewhat hard  Food Insecurity: No Food Insecurity (10/11/2019)    Hunger Vital Sign    Worried About Running Out of Food in the Last Year: Never true    Ran Out of Food in the Last Year: Never true  Transportation Needs: No Transportation Needs (10/11/2019)   PRAPARE - Hydrologist (Medical): No    Lack of Transportation (Non-Medical): No  Physical Activity: Inactive (10/11/2019)   Exercise Vital Sign    Days of Exercise per Week: 0 days    Minutes of Exercise per Session: 0 min  Stress: Stress Concern Present (10/11/2019)   [place]    Feeling of Stress : Rather much  Social Connections: Unknown (10/11/2019)   Social Connection and Isolation Panel [NHANES]    Frequency of Communication with Friends and Family: More than three times a week    Frequency of Social Gatherings with Friends and Family: More than three times a week    Attends Religious Services: Never    Marine scientist or Organizations: No    Attends Music therapist: Not asked    Marital Status: Not on file  Intimate Partner Violence: Not At Risk (10/11/2019)   Humiliation, Afraid, Rape, and Kick questionnaire    Fear of Current or Ex-Partner: No    Emotionally Abused: No    Physically Abused: No    Sexually Abused: No     Current Outpatient Medications:    carvedilol (COREG) 25 MG tablet, Take 1 tablet (25 mg total) by mouth daily at 2 PM., Disp: 90 tablet, Rfl: 3   cyanocobalamin ([employer] VITAMIN B12) 1000 MCG tablet, Take 1 tablet (1,000 mcg total) by mouth daily., Disp: 90 tablet, Rfl: 1   levothyroxine (SYNTHROID) 150 MCG tablet, TAKE 1 TABLET BY MOUTH DAILY, 30 MINUTES BEFORE A MEAL, Disp: 30 tablet, Rfl: 6   nitroGLYCERIN (NITROLINGUAL) 0.4 MG/SPRAY spray, Place 1 spray under the tongue every 5 (five) minutes x 3 doses as needed for chest pain., Disp: 12 g, Rfl: 2   omeprazole (PRILOSEC) 20 MG capsule, Take 1 capsule (20 mg total) by mouth daily., Disp: 90 capsule,  Rfl: 0   Oxycodone HCl 10 MG TABS, Take 10 mg by mouth 4 (four) times daily as needed., Disp: , Rfl:    OXYCONTIN 10 MG 12 hr tablet, SMARTSIG:1 Tablet(s) By Mouth Every 12 Hours, Disp: , Rfl:    ZTLIDO 1.8 % PTCH, Apply 1 patch topically daily., Disp: , Rfl:    Allergies  Allergen Reactions   Vicodin [Hydrocodone-Acetaminophen] Rash   Amoxicillin Rash and Other (See Comments)    Has patient had a PCN reaction causing immediate rash, facial/tongue/throat swelling, SOB or lightheadedness with hypotension: Yes Has patient had a PCN reaction causing severe rash involving mucus membranes or skin necrosis: No Has patient had a PCN reaction that required hospitalization: No Has patient had a PCN reaction occurring within the last 10 years: No If all of the above answers are "NO", then may proceed with Cephalosporin use.    Gabapentin Rash    ROS Review of Systems  Constitutional: Negative.  HENT: Negative.    Eyes: Negative.   Respiratory: Negative.    Cardiovascular: Negative.   Gastrointestinal: Negative.   Endocrine: Negative.   Genitourinary: Negative.   Musculoskeletal: Negative.   Skin: Negative.   Allergic/Immunologic: Negative.   Neurological: Negative.   Hematological: Negative.   Psychiatric/Behavioral: Negative.    All other systems reviewed and are negative.     Objective:    Physical Exam Vitals reviewed.  Constitutional:      Appearance: Normal appearance.  HENT:     Mouth/Throat:     Mouth: Mucous membranes are moist.  Eyes:     Pupils: Pupils are equal, round, and reactive to light.  Neck:     Vascular: No carotid bruit.  Cardiovascular:     Rate and Rhythm: Normal rate and regular rhythm.     Pulses: Normal pulses.     Heart sounds: Normal heart sounds.  Pulmonary:     Effort: Pulmonary effort is normal.     Breath sounds: Normal breath sounds.  Abdominal:     General: Bowel sounds are normal.     Palpations: Abdomen is soft. There is no  hepatomegaly, splenomegaly or mass.     Tenderness: There is no abdominal tenderness.     Hernia: No hernia is present.  Musculoskeletal:        General: No tenderness.     Cervical back: Neck supple.     Right lower leg: No edema.     Left lower leg: No edema.  Skin:    Findings: No rash.  Neurological:     Mental Status: She is alert and oriented to person, place, and time.     Motor: No weakness.  Psychiatric:        Mood and Affect: Mood and affect normal.        Behavior: Behavior normal.     BP 131/81   Pulse 68   Temp 97.6 F (36.4 C) (Temporal)   Ht _0  (1.448 m)   Wt 205 lb 9.6 oz (93.3 kg)   LMP  (LMP Unknown) Comment: last menstrual 9 years ago, tubal ligation  BMI 44.49 kg/m  Wt Readings from Last 3 Encounters:  11/16/22 205 lb 9.6 oz (93.3 kg)  09/21/22 203 lb 9.6 oz (92.4 kg)  07/23/22 201 lb 3.2 oz (91.3 kg)     Health Maintenance Due  Topic Date Due   DTaP/Tdap/Td (1 - Tdap) Never done   PAP SMEAR-Modifier  Never done   MAMMOGRAM  Never done    There are no preventive care reminders to display for this patient.  Lab Results  Component Value Date   TSH 0.01 (L) 07/08/2022   Lab Results  Component Value Date   WBC 1.9 (L) 07/23/2022   HGB 11.8 (L) 07/23/2022   HCT 34.9 (L) 07/23/2022   MCV 85.1 07/23/2022   PLT 47 (L) 07/23/2022   Lab Results  Component Value Date   NA 140 07/23/2022   K 4.0 07/23/2022   CO2 26 07/23/2022   GLUCOSE 105 (H) 07/23/2022   BUN 12 07/23/2022   CREATININE 0.42 (L) 07/23/2022   BILITOT 0.8 07/23/2022   ALKPHOS 107 07/23/2022   AST 33 07/23/2022   ALT 19 07/23/2022   PROT 6.4 (L) 07/23/2022   ALBUMIN 3.3 (L) 07/23/2022   CALCIUM 8.8 (L) 07/23/2022   ANIONGAP 3 (L) 07/23/2022   EGFR 111 07/08/2022   GFR 115.91 10/20/2021   Lab Results  Component Value Date  CHOL 118 07/08/2022   Lab Results  Component Value Date   HDL 30 (L) 07/08/2022   Lab Results  Component Value Date   LDLCALC 69  07/08/2022   Lab Results  Component Value Date   TRIG 105 07/08/2022   Lab Results  Component Value Date   CHOLHDL 3.9 07/08/2022   Lab Results  Component Value Date   HGBA1C 4.9 12/29/2017      Assessment & Plan:   Problem List Items Addressed This Visit       Cardiovascular and Mediastinum   Coronary artery disease   Essential hypertension     Patient denies any chest pain or shortness of breath there is no history of palpitation or paroxysmal nocturnal dyspnea   patient was advised to follow low-salt low-cholesterol diet    ideally I want to keep systolic blood pressure below 130 mmHg, patient was asked to check blood pressure one times a week and give me a report on that.  Patient will be follow-up in 3 months  or earlier as needed, patient will call me back for any change in the cardiovascular symptoms Patient was advised to buy a book from local bookstore concerning blood pressure and read several chapters  every day.  This will be supplemented by some of the material we will give him from the office.  Patient should also utilize other resources like YouTube and Internet to learn more about the blood pressure and the diet.        Digestive   Other cirrhosis of liver ([place])    Refer to GI        Hematopoietic and Hemostatic   Thrombocytopenia ([place])    Needs regular follow-up        Other   Anxiety and depression    - Patient experiencing high levels of anxiety.  - Encouraged patient to engage in relaxing activities like yoga, meditation, journaling, going for a walk, or participating in a hobby.  - Encouraged patient to reach out to trusted friends or family members about recent struggles, Patient was advised to read A book, how to stop worrying and start living, it is good book to read to control  the stress       Obesity (BMI 35.0-39.9 without comorbidity)    - I encouraged the patient to lose weight.  - I educated them on making healthy dietary choices  including eating more fruits and vegetables and less fried foods. - I encouraged the patient to exercise more, and educated on the benefits of exercise including weight loss, diabetes prevention, and hypertension prevention.   Dietary counseling with a registered dietician  Referral to a weight management support group (e.g. Weight Watchers, Overeaters Anonymous)  If your BMI is greater than 29 or you have gained more than 15 pounds you should work on weight loss.  Attend a healthy cooking class       Other Visit Diagnoses     Cough, unspecified type    -  Primary   Relevant Orders   POC COVID-19 (Completed)   Nonintractable headache, unspecified chronicity pattern, unspecified headache type       Relevant Orders   POC COVID-19 (Completed)       No orders of the defined types were placed in this encounter. Patient was given Z-Pak for upper respiratory infection  Follow-up: No follow-ups on file.    Cletis Athens, MD

## 2022-11-23 ENCOUNTER — Encounter: Payer: Self-pay | Admitting: Internal Medicine

## 2022-11-23 ENCOUNTER — Ambulatory Visit (INDEPENDENT_AMBULATORY_CARE_PROVIDER_SITE_OTHER): Payer: Medicaid Other | Admitting: Internal Medicine

## 2022-11-23 VITALS — BP 122/80 | HR 54 | Ht <= 58 in | Wt 205.0 lb

## 2022-11-23 DIAGNOSIS — I2089 Other forms of angina pectoris: Secondary | ICD-10-CM | POA: Diagnosis not present

## 2022-11-23 DIAGNOSIS — I10 Essential (primary) hypertension: Secondary | ICD-10-CM | POA: Diagnosis not present

## 2022-11-23 DIAGNOSIS — J029 Acute pharyngitis, unspecified: Secondary | ICD-10-CM

## 2022-11-23 DIAGNOSIS — K7469 Other cirrhosis of liver: Secondary | ICD-10-CM

## 2022-11-23 DIAGNOSIS — R059 Cough, unspecified: Secondary | ICD-10-CM | POA: Diagnosis not present

## 2022-11-23 DIAGNOSIS — E039 Hypothyroidism, unspecified: Secondary | ICD-10-CM | POA: Diagnosis not present

## 2022-11-23 LAB — POCT INFLUENZA A/B
Influenza A, POC: NEGATIVE
Influenza B, POC: NEGATIVE

## 2022-11-23 LAB — POC COVID19 BINAXNOW: SARS Coronavirus 2 Ag: NEGATIVE

## 2022-11-23 MED ORDER — LEVOFLOXACIN 500 MG PO TABS: 500.0000 mg | ORAL_TABLET | Freq: Every day | ORAL | 0 refills | Status: AC

## 2022-11-23 NOTE — Progress Notes (Signed)
Established Patient Office Visit  Subjective:  Patient ID: Brandi Bright, female    DOB: 1971/04/17  Age: 51 y.o. MRN: [id number]  CC:  Chief Complaint  Patient presents with   Cough    And sore throat. This is not a new problem. She has pulled muscle in chest and back from coughing. Everythings he does makes her out of breath. Finished zpak. It is worse in early morning and late at night.     Cough Associated symptoms include shortness of breath.    [name] presents for uri not improving  Past Medical History:  Diagnosis Date   Arthritis    Back pain    Chronic knee pain    Cirrhosis ([place])    Gout    Hypothyroidism    IDA (iron deficiency anemia) 01/16/2020   MI, old    Thyroid disease     Past Surgical History:  Procedure Laterality Date   CARDIAC CATHETERIZATION     CESAREAN SECTION     COLONOSCOPY WITH PROPOFOL N/A 03/16/2018   Procedure: COLONOSCOPY WITH PROPOFOL;  Surgeon: Virgel Manifold, MD;  Location: [facility] ENDOSCOPY;  Service: Endoscopy;  Laterality: N/A;   COLONOSCOPY WITH PROPOFOL N/A 07/09/2021   Procedure: COLONOSCOPY WITH PROPOFOL;  Surgeon: Virgel Manifold, MD;  Location: [facility]OSCOPY;  Service: Endoscopy;  Laterality: N/A;   ESOPHAGOGASTRODUODENOSCOPY N/A 10/11/2019   Procedure: ESOPHAGOGASTRODUODENOSCOPY (EGD);  Surgeon: Lin Landsman, MD;  Location: [facility] ENDOSCOPY;  Service: Gastroenterology;  Laterality: N/A;   ESOPHAGOGASTRODUODENOSCOPY (EGD) WITH PROPOFOL N/A 10/27/2018   Procedure: ESOPHAGOGASTRODUODENOSCOPY (EGD) WITH PROPOFOL;  Surgeon: Virgel Manifold, MD;  Location: [facility] ENDOSCOPY;  Service: Endoscopy;  Laterality: N/A;   ESOPHAGOGASTRODUODENOSCOPY (EGD) WITH PROPOFOL N/A 06/25/2019   Procedure: ESOPHAGOGASTRODUODENOSCOPY (EGD) WITH PROPOFOL;  Surgeon: Jonathon Bellows, MD;  Location: [facility] ENDOSCOPY;  Service: Gastroenterology;  Laterality: N/A;   ESOPHAGOGASTRODUODENOSCOPY (EGD) WITH PROPOFOL N/A 09/12/2019   Procedure:  ESOPHAGOGASTRODUODENOSCOPY (EGD) WITH PROPOFOL;  Surgeon: Lucilla Lame, MD;  Location: [facility] ENDOSCOPY;  Service: Endoscopy;  Laterality: N/A;   ESOPHAGOGASTRODUODENOSCOPY (EGD) WITH PROPOFOL N/A 11/20/2019   Procedure: ESOPHAGOGASTRODUODENOSCOPY (EGD) WITH PROPOFOL;  Surgeon: Virgel Manifold, MD;  Location: [facility] ENDOSCOPY;  Service: Endoscopy;  Laterality: N/A;   ESOPHAGOGASTRODUODENOSCOPY (EGD) WITH PROPOFOL N/A 11/23/2019   Procedure: ESOPHAGOGASTRODUODENOSCOPY (EGD) WITH PROPOFOL;  Surgeon: Virgel Manifold, MD;  Location: [facility] ENDOSCOPY;  Service: Endoscopy;  Laterality: N/A;   ESOPHAGOGASTRODUODENOSCOPY (EGD) WITH PROPOFOL N/A 01/03/2020   Procedure: ESOPHAGOGASTRODUODENOSCOPY (EGD) WITH PROPOFOL;  Surgeon: Lin Landsman, MD;  Location: [place];  Service: Endoscopy;  Laterality: N/A;   ESOPHAGOGASTRODUODENOSCOPY (EGD) WITH PROPOFOL N/A 07/09/2021   Procedure: ESOPHAGOGASTRODUODENOSCOPY (EGD) WITH PROPOFOL;  Surgeon: Virgel Manifold, MD;  Location: [facility] ENDOSCOPY;  Service: Endoscopy;  Laterality: N/A;   TUBAL LIGATION      Family History  Problem Relation Age of Onset   Hypertension Other    CAD Father    Colon cancer Father    Lung cancer Father    Pancreatic cancer Brother     Social History   Socioeconomic History   Marital status: Single    Spouse name: Not on file   Number of children: Not on file   Years of education: Not on file   Highest education level: Not on file  Occupational History   Not on file  Tobacco Use   Smoking status: Never   Smokeless tobacco: Never  Vaping Use   Vaping Use: Never used  Substance and Sexual  Activity   Alcohol use: Yes    Comment: rarely   Drug use: Yes    Comment: prescribed oxycodone and oxycotton   Sexual activity: Not Currently  Other Topics Concern   Not on file  Social History Narrative   Not on file   Social Determinants of Health   Financial Resource Strain: Medium Risk (10/11/2019)   Overall  Financial Resource Strain (CARDIA)    Difficulty of Paying Living Expenses: Somewhat hard  Food Insecurity: No Food Insecurity (10/11/2019)   Hunger Vital Sign    Worried About Running Out of Food in the Last Year: Never true    Ran Out of Food in the Last Year: Never true  Transportation Needs: No Transportation Needs (10/11/2019)   PRAPARE - Hydrologist (Medical): No    Lack of Transportation (Non-Medical): No  Physical Activity: Inactive (10/11/2019)   Exercise Vital Sign    Days of Exercise per Week: 0 days    Minutes of Exercise per Session: 0 min  Stress: Stress Concern Present (10/11/2019)   [place]    Feeling of Stress : Rather much  Social Connections: Unknown (10/11/2019)   Social Connection and Isolation Panel [NHANES]    Frequency of Communication with Friends and Family: More than three times a week    Frequency of Social Gatherings with Friends and Family: More than three times a week    Attends Religious Services: Never    Marine scientist or Organizations: No    Attends Music therapist: Not asked    Marital Status: Not on file  Intimate Partner Violence: Not At Risk (10/11/2019)   Humiliation, Afraid, Rape, and Kick questionnaire    Fear of Current or Ex-Partner: No    Emotionally Abused: No    Physically Abused: No    Sexually Abused: No     Current Outpatient Medications:    carvedilol (COREG) 25 MG tablet, Take 1 tablet (25 mg total) by mouth daily at 2 PM., Disp: 90 tablet, Rfl: 3   cyanocobalamin ([employer] VITAMIN B12) 1000 MCG tablet, Take 1 tablet (1,000 mcg total) by mouth daily., Disp: 90 tablet, Rfl: 1   levofloxacin (LEVAQUIN) 500 MG tablet, Take 1 tablet (500 mg total) by mouth daily for 7 days., Disp: 7 tablet, Rfl: 0   levothyroxine (SYNTHROID) 150 MCG tablet, TAKE 1 TABLET BY MOUTH DAILY, 30 MINUTES BEFORE A MEAL, Disp: 30 tablet, Rfl:  6   nitroGLYCERIN (NITROLINGUAL) 0.4 MG/SPRAY spray, Place 1 spray under the tongue every 5 (five) minutes x 3 doses as needed for chest pain., Disp: 12 g, Rfl: 2   omeprazole (PRILOSEC) 20 MG capsule, Take 1 capsule (20 mg total) by mouth daily., Disp: 90 capsule, Rfl: 0   Oxycodone HCl 10 MG TABS, Take 10 mg by mouth 4 (four) times daily as needed., Disp: , Rfl:    OXYCONTIN 10 MG 12 hr tablet, SMARTSIG:1 Tablet(s) By Mouth Every 12 Hours, Disp: , Rfl:    ZTLIDO 1.8 % PTCH, Apply 1 patch topically daily., Disp: , Rfl:    Allergies  Allergen Reactions   Vicodin [Hydrocodone-Acetaminophen] Rash   Amoxicillin Rash and Other (See Comments)    Has patient had a PCN reaction causing immediate rash, facial/tongue/throat swelling, SOB or lightheadedness with hypotension: Yes Has patient had a PCN reaction causing severe rash involving mucus membranes or skin necrosis: No Has patient had a PCN reaction that  required hospitalization: No Has patient had a PCN reaction occurring within the last 10 years: No If all of the above answers are "NO", then may proceed with Cephalosporin use.    Gabapentin Rash    ROS Review of Systems  Respiratory:  Positive for cough and shortness of breath.       Objective:    Physical Exam Vitals reviewed.  Constitutional:      Appearance: Normal appearance.  HENT:     Mouth/Throat:     Mouth: Mucous membranes are moist.  Eyes:     Pupils: Pupils are equal, round, and reactive to light.  Neck:     Vascular: No carotid bruit.  Cardiovascular:     Rate and Rhythm: Normal rate and regular rhythm.     Pulses: Normal pulses.     Heart sounds: Normal heart sounds.  Pulmonary:     Effort: Pulmonary effort is normal.     Breath sounds: Wheezing present. No rales.  Abdominal:     General: Bowel sounds are normal.     Palpations: Abdomen is soft. There is no hepatomegaly, splenomegaly or mass.     Tenderness: There is no abdominal tenderness.     Hernia: No  hernia is present.  Musculoskeletal:        General: No tenderness.     Cervical back: Neck supple.     Right lower leg: No edema.     Left lower leg: No edema.  Skin:    Findings: No rash.  Neurological:     Mental Status: She is alert and oriented to person, place, and time.     Motor: No weakness.  Psychiatric:        Mood and Affect: Mood and affect normal.        Behavior: Behavior normal.     BP 122/80   Pulse (!) 54   Ht _0  (1.448 m)   Wt 205 lb (93 kg)   LMP  (LMP Unknown) Comment: last menstrual 9 years ago, tubal ligation  SpO2 96%   BMI 44.36 kg/m  Wt Readings from Last 3 Encounters:  11/23/22 205 lb (93 kg)  11/16/22 205 lb 9.6 oz (93.3 kg)  09/21/22 203 lb 9.6 oz (92.4 kg)     Health Maintenance Due  Topic Date Due   DTaP/Tdap/Td (1 - Tdap) Never done   PAP SMEAR-Modifier  Never done   MAMMOGRAM  Never done    There are no preventive care reminders to display for this patient.  Lab Results  Component Value Date   TSH 0.01 (L) 07/08/2022   Lab Results  Component Value Date   WBC 1.9 (L) 07/23/2022   HGB 11.8 (L) 07/23/2022   HCT 34.9 (L) 07/23/2022   MCV 85.1 07/23/2022   PLT 47 (L) 07/23/2022   Lab Results  Component Value Date   NA 140 07/23/2022   K 4.0 07/23/2022   CO2 26 07/23/2022   GLUCOSE 105 (H) 07/23/2022   BUN 12 07/23/2022   CREATININE 0.42 (L) 07/23/2022   BILITOT 0.8 07/23/2022   ALKPHOS 107 07/23/2022   AST 33 07/23/2022   ALT 19 07/23/2022   PROT 6.4 (L) 07/23/2022   ALBUMIN 3.3 (L) 07/23/2022   CALCIUM 8.8 (L) 07/23/2022   ANIONGAP 3 (L) 07/23/2022   EGFR 111 07/08/2022   GFR 115.91 10/20/2021   Lab Results  Component Value Date   CHOL 118 07/08/2022   Lab Results  Component Value Date  HDL 30 (L) 07/08/2022   Lab Results  Component Value Date   LDLCALC 69 07/08/2022   Lab Results  Component Value Date   TRIG 105 07/08/2022   Lab Results  Component Value Date   CHOLHDL 3.9 07/08/2022   Lab  Results  Component Value Date   HGBA1C 4.9 12/29/2017      Assessment & Plan:   Problem List Items Addressed This Visit       Cardiovascular and Mediastinum   Essential hypertension     Patient denies any chest pain or shortness of breath there is no history of palpitation or paroxysmal nocturnal dyspnea   patient was advised to follow low-salt low-cholesterol diet    ideally I want to keep systolic blood pressure below 130 mmHg, patient was asked to check blood pressure one times a week and give me a report on that.  Patient will be follow-up in 3 months  or earlier as needed, patient will call me back for any change in the cardiovascular symptoms Patient was advised to buy a book from local bookstore concerning blood pressure and read several chapters  every day.  This will be supplemented by some of the material we will give him from the office.  Patient should also utilize other resources like YouTube and Internet to learn more about the blood pressure and the diet.      Angina of effort     Digestive   Other cirrhosis of liver ([facility])     Endocrine   Hypothyroidism     Other   Cough - Primary   Relevant Orders   POCT Influenza A/B (Completed)   POC COVID-19 (Completed)   Other Visit Diagnoses     Sore throat       Relevant Orders   POCT Influenza A/B (Completed)   POC COVID-19 (Completed)       Meds ordered this encounter  Medications   levofloxacin (LEVAQUIN) 500 MG tablet    Sig: Take 1 tablet (500 mg total) by mouth daily for 7 days.    Dispense:  7 tablet    Refill:  0  Patient was started on Levaquin for 7 days, she can come back if she has further problems  Follow-up: No follow-ups on file.    Cletis Athens, MD

## 2022-11-23 NOTE — Assessment & Plan Note (Signed)

## 2022-12-01 DIAGNOSIS — M47896 Other spondylosis, lumbar region: Secondary | ICD-10-CM | POA: Diagnosis not present

## 2022-12-01 DIAGNOSIS — M4316 Spondylolisthesis, lumbar region: Secondary | ICD-10-CM | POA: Insufficient documentation

## 2022-12-01 DIAGNOSIS — M545 Low back pain, unspecified: Secondary | ICD-10-CM | POA: Insufficient documentation

## 2022-12-01 DIAGNOSIS — M47816 Spondylosis without myelopathy or radiculopathy, lumbar region: Secondary | ICD-10-CM | POA: Diagnosis not present

## 2022-12-01 DIAGNOSIS — G8929 Other chronic pain: Secondary | ICD-10-CM | POA: Diagnosis not present

## 2022-12-15 DIAGNOSIS — M545 Low back pain, unspecified: Secondary | ICD-10-CM | POA: Diagnosis not present

## 2022-12-23 DIAGNOSIS — Z79899 Other long term (current) drug therapy: Secondary | ICD-10-CM | POA: Diagnosis not present

## 2022-12-23 DIAGNOSIS — G894 Chronic pain syndrome: Secondary | ICD-10-CM | POA: Diagnosis not present

## 2022-12-23 DIAGNOSIS — Z79891 Long term (current) use of opiate analgesic: Secondary | ICD-10-CM | POA: Diagnosis not present

## 2022-12-23 DIAGNOSIS — M542 Cervicalgia: Secondary | ICD-10-CM | POA: Diagnosis not present

## 2023-01-25 ENCOUNTER — Ambulatory Visit: Payer: Medicaid Other | Admitting: Oncology

## 2023-01-25 ENCOUNTER — Other Ambulatory Visit: Payer: Medicaid Other

## 2023-02-05 ENCOUNTER — Encounter: Payer: Self-pay | Admitting: Oncology

## 2023-02-05 ENCOUNTER — Inpatient Hospital Stay: Payer: Medicaid Other

## 2023-02-05 ENCOUNTER — Inpatient Hospital Stay: Payer: Medicaid Other | Attending: Oncology | Admitting: Oncology

## 2023-02-05 VITALS — BP 140/80 | HR 70 | Temp 98.0°F | Resp 18 | Wt 211.6 lb

## 2023-02-05 DIAGNOSIS — K7469 Other cirrhosis of liver: Secondary | ICD-10-CM | POA: Diagnosis not present

## 2023-02-05 DIAGNOSIS — R161 Splenomegaly, not elsewhere classified: Secondary | ICD-10-CM | POA: Diagnosis not present

## 2023-02-05 DIAGNOSIS — K7460 Unspecified cirrhosis of liver: Secondary | ICD-10-CM | POA: Diagnosis not present

## 2023-02-05 DIAGNOSIS — D500 Iron deficiency anemia secondary to blood loss (chronic): Secondary | ICD-10-CM

## 2023-02-05 DIAGNOSIS — B1920 Unspecified viral hepatitis C without hepatic coma: Secondary | ICD-10-CM | POA: Diagnosis not present

## 2023-02-05 DIAGNOSIS — D72819 Decreased white blood cell count, unspecified: Secondary | ICD-10-CM | POA: Insufficient documentation

## 2023-02-05 DIAGNOSIS — D696 Thrombocytopenia, unspecified: Secondary | ICD-10-CM | POA: Diagnosis not present

## 2023-02-05 DIAGNOSIS — D509 Iron deficiency anemia, unspecified: Secondary | ICD-10-CM | POA: Diagnosis not present

## 2023-02-05 LAB — APTT: aPTT: 34 seconds (ref 24–36)

## 2023-02-05 LAB — IRON AND TIBC
Iron: 36 ug/dL (ref 28–170)
Saturation Ratios: 8 % — ABNORMAL LOW (ref 10.4–31.8)
TIBC: 469 ug/dL — ABNORMAL HIGH (ref 250–450)
UIBC: 433 ug/dL

## 2023-02-05 LAB — CBC WITH DIFFERENTIAL/PLATELET
Abs Immature Granulocytes: 0 10*3/uL (ref 0.00–0.07)
Basophils Absolute: 0 10*3/uL (ref 0.0–0.1)
Basophils Relative: 1 %
Eosinophils Absolute: 0 10*3/uL (ref 0.0–0.5)
Eosinophils Relative: 3 %
HCT: 29.4 % — ABNORMAL LOW (ref 36.0–46.0)
Hemoglobin: 9.7 g/dL — ABNORMAL LOW (ref 12.0–15.0)
Immature Granulocytes: 0 %
Lymphocytes Relative: 33 %
Lymphs Abs: 0.5 10*3/uL — ABNORMAL LOW (ref 0.7–4.0)
MCH: 27.9 pg (ref 26.0–34.0)
MCHC: 33 g/dL (ref 30.0–36.0)
MCV: 84.5 fL (ref 80.0–100.0)
Monocytes Absolute: 0.2 10*3/uL (ref 0.1–1.0)
Monocytes Relative: 9 %
Neutro Abs: 0.9 10*3/uL — ABNORMAL LOW (ref 1.7–7.7)
Neutrophils Relative %: 54 %
Platelets: 44 10*3/uL — ABNORMAL LOW (ref 150–400)
RBC: 3.48 MIL/uL — ABNORMAL LOW (ref 3.87–5.11)
RDW: 12.8 % (ref 11.5–15.5)
WBC: 1.6 10*3/uL — ABNORMAL LOW (ref 4.0–10.5)
nRBC: 0 % (ref 0.0–0.2)

## 2023-02-05 LAB — COMPREHENSIVE METABOLIC PANEL
ALT: 19 U/L (ref 0–44)
AST: 28 U/L (ref 15–41)
Albumin: 3.2 g/dL — ABNORMAL LOW (ref 3.5–5.0)
Alkaline Phosphatase: 98 U/L (ref 38–126)
Anion gap: 6 (ref 5–15)
BUN: 10 mg/dL (ref 6–20)
CO2: 23 mmol/L (ref 22–32)
Calcium: 8.4 mg/dL — ABNORMAL LOW (ref 8.9–10.3)
Chloride: 109 mmol/L (ref 98–111)
Creatinine, Ser: 0.43 mg/dL — ABNORMAL LOW (ref 0.44–1.00)
GFR, Estimated: >60 mL/min (ref >60)
Glucose, Bld: 111 mg/dL — ABNORMAL HIGH (ref 70–99)
Potassium: 3.6 mmol/L (ref 3.5–5.1)
Sodium: 138 mmol/L (ref 135–145)
Total Bilirubin: 0.9 mg/dL (ref 0.3–1.2)
Total Protein: 6.3 g/dL — ABNORMAL LOW (ref 6.5–8.1)

## 2023-02-05 LAB — FERRITIN: Ferritin: 4 ng/mL — ABNORMAL LOW (ref 11–307)

## 2023-02-05 LAB — PROTIME-INR
INR: 1.4 — ABNORMAL HIGH (ref 0.8–1.2)
Prothrombin Time: 16.6 seconds — ABNORMAL HIGH (ref 11.4–15.2)

## 2023-02-05 NOTE — Assessment & Plan Note (Addendum)
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 Recommend checking CBC prior to future elective procedures.  Recommend Platelet transfusion or TPO [Avatrombopag or Lusutrombopag] if platelet is <50,000

## 2023-02-05 NOTE — Assessment & Plan Note (Signed)
She is long over due for GI follow up. Refer to GI.  AFP has been followed up and is normal. Today's level is pending.

## 2023-02-05 NOTE — Assessment & Plan Note (Signed)
Labs are consistent with iron deficiency anemia.  Recommend IV venofer weekly x 4

## 2023-02-05 NOTE — Progress Notes (Signed)
Hematology/Oncology Progress note [phone] [phone]    Chief Complaint: Brandi Bright is a 52 y.o. female follows up for management of iron deficiency anemia, thrombocytopenia, and leukopenia   ASSESSMENT & PLAN:   Thrombocytopenia ([place]) # Chronic neutropenia and thrombocytopenia Previous Bone marrow biopsy work-up was negative  Low counts are due to chronic cirrhosis/splenomegaly. Recommend checking CBC prior to future elective procedures.  Recommend Platelet transfusion or TPO [Avatrombopag or Lusutrombopag] if platelet is <50,000   Iron deficiency anemia Labs are consistent with iron deficiency anemia.  Recommend IV venofer weekly x 4   Other cirrhosis of liver ([place]) She is long over due for GI follow up. Refer to GI.  AFP has been followed up and is normal. Today's level is pending.    Orders Placed This Encounter  Procedures   Iron and TIBC    Standing Status:   Future    Number of Occurrences:   1    Standing Expiration Date:   02/06/2024   Ferritin    Standing Status:   Future    Number of Occurrences:   1    Standing Expiration Date:   02/06/2024   CBC with Differential (Cancer Center Only)    Standing Status:   Future    Standing Expiration Date:   02/06/2024   CMP ([place] only)    Standing Status:   Future    Standing Expiration Date:   02/06/2024   AFP tumor marker    Standing Status:   Future    Standing Expiration Date:   02/05/2024   APTT    Standing Status:   Future    Standing Expiration Date:   02/06/2024   Protime-INR    Standing Status:   Future    Standing Expiration Date:   02/06/2024   Ferritin    Standing Status:   Future    Standing Expiration Date:   02/06/2024   Iron and TIBC    Standing Status:   Future    Standing Expiration Date:   02/06/2024   Ambulatory referral to Gastroenterology    Referral Priority:   Routine    Referral Type:   Consultation    Referral Reason:   Specialty Services Required     Number of Visits Requested:   1   Follow up in 6 months.  All questions were answered. The patient knows to call the clinic with any problems, questions or concerns.  Earlie Server, MD, PhD [facility] Hematology Oncology 02/05/2023    PERTINENT HEMATOLOGY HISTORY Patient follows up with Dr. Mike Gip previously.  Establish care with me on 02/09/2019. Reviewed patient's previous medical records, labs, imaging results. # History of cirrhosis and hepatitis C.  She has a history of chronic anemia, thrombocytopenia and leukopenia dating back to 03/2012.  Platelet count has fluctuated between 54,000 - 56,000 since 12/28/2017 (previously 73,000 - 134,000).  Ferritin was 14 on 08/31/2018.    Work-up on 09/23/2018 revealed a hematocrit of 31.0, hemoglobin 10.1, MCV 87.6, platelets 46,000, WBC 1900 with an ANC of 1100.  Ferritin was 10 (low) with iron saturation 16% with a TIBC of 401.  Retic was 1.2%.  B12 was 315.  Normal studies included: folate, SPEP, and free light chain ratio.  Copper was 69 (72-166).  ANA was + with double stranded DNA antibody 13 (0-9).  TSH was 0.036 (0.35-4.5) with a free T4 1.05 (0.82-1.77).  Peripheral smear revealed variant lymphocytes.  Ferritin has been followed:  14 on  08/31/2018 and 10 on 09/23/2018.  Abdomen and pelvic CT on 07/14/2018 revealed cirrhosis with portal hypertension noted by prominent splenomegaly (18.5 x 13.8 x 8.1 cm; volume 1100 cm3), enlarged portal veins with recannulated umbilical vein, paraesophageal and perigastric varices.  There was mild thickening of the cecum and ascending colon.  EGD on 10/27/2018 revealed grade II esophageal varices.  There was erythematous mucosa in the antrum.  There was congested, erythematous, friable (with contact bleeding), granular and nodular mucosa in the gastric fundus and astric body.  There was a single gastric polyp (polypoid ulcerated antral type mucos with chronic active mucosal inflammation).  There was a normal  duodenal bulb, second portion of the duodenum and examined duodenum.  The patient's iron deficiency could be explained by her friable gastric mucosa (likely from portal hypertension).  There was no history of variceal bleeding and thus this is not a cause of her iron deficiency.  # admitted from 10/11/2019 to 10/12/2019 due to rectal bleeding EGD 10/12/2019 showed portal hypertensive gastropathy, treated with APC.  Nonbleeding large esophageal varices incompletely evaluated.  Banded. Patient follows with gastroenterology and had EGD on 11/20/2019. Banding was not done due to large amount of food in the stomach. There was plan for EGD on 11/23/2019.  Her gastroenterologist Dr. Bonna Gains contacted me to see if patient can get platelet transfusion to improve her platelet counts for banding on 11/23/2019.     INTERVAL HISTORY Brandi Bright is a 52 y.o. female who has above history reviewed by me today presents for follow up visit for management of thrombocytopenia, splenomegaly, leukopenia and anemia. She feels well today. + easy bruising + intermittent rectal bleeding.  She has not followed up with GI recently. Last seen in 2022    Review of Systems  Constitutional:  Positive for fatigue. Negative for appetite change, chills and fever.  HENT:   Negative for hearing loss and voice change.   Eyes:  Negative for eye problems.  Respiratory:  Negative for chest tightness and cough.   Cardiovascular:  Negative for chest pain.  Gastrointestinal:  Positive for blood in stool. Negative for abdominal distention and abdominal pain.  Endocrine: Negative for hot flashes.  Genitourinary:  Negative for difficulty urinating and frequency.   Musculoskeletal:  Negative for arthralgias.  Skin:  Negative for itching and rash.  Neurological:  Negative for extremity weakness.  Hematological:  Negative for adenopathy. Bruises/bleeds easily.  Psychiatric/Behavioral:  Negative for confusion.    Past Medical  History:  Diagnosis Date   Arthritis    Back pain    Chronic knee pain    Cirrhosis (HCC)    Gout    Hypothyroidism    IDA (iron deficiency anemia) 01/16/2020   MI, old    Thyroid disease     Past Surgical History:  Procedure Laterality Date   CARDIAC CATHETERIZATION     CESAREAN SECTION     COLONOSCOPY WITH PROPOFOL N/A 03/16/2018   Procedure: COLONOSCOPY WITH PROPOFOL;  Surgeon: Virgel Manifold, MD;  Location: [facility] ENDOSCOPY;  Service: Endoscopy;  Laterality: N/A;   COLONOSCOPY WITH PROPOFOL N/A 07/09/2021   Procedure: COLONOSCOPY WITH PROPOFOL;  Surgeon: Virgel Manifold, MD;  Location: [facility] ENDOSCOPY;  Service: Endoscopy;  Laterality: N/A;   ESOPHAGOGASTRODUODENOSCOPY N/A 10/11/2019   Procedure: ESOPHAGOGASTRODUODENOSCOPY (EGD);  Surgeon: Lin Landsman, MD;  Location: [facility] ENDOSCOPY;  Service: Gastroenterology;  Laterality: N/A;   ESOPHAGOGASTRODUODENOSCOPY (EGD) WITH PROPOFOL N/A 10/27/2018   Procedure: ESOPHAGOGASTRODUODENOSCOPY (EGD) WITH PROPOFOL;  Surgeon: [name]  B, MD;  Location: [facility] ENDOSCOPY;  Service: Endoscopy;  Laterality: N/A;   ESOPHAGOGASTRODUODENOSCOPY (EGD) WITH PROPOFOL N/A 06/25/2019   Procedure: ESOPHAGOGASTRODUODENOSCOPY (EGD) WITH PROPOFOL;  Surgeon: Jonathon Bellows, MD;  Location: [facility] ENDOSCOPY;  Service: Gastroenterology;  Laterality: N/A;   ESOPHAGOGASTRODUODENOSCOPY (EGD) WITH PROPOFOL N/A 09/12/2019   Procedure: ESOPHAGOGASTRODUODENOSCOPY (EGD) WITH PROPOFOL;  Surgeon: Lucilla Lame, MD;  Location: [facility] ENDOSCOPY;  Service: Endoscopy;  Laterality: N/A;   ESOPHAGOGASTRODUODENOSCOPY (EGD) WITH PROPOFOL N/A 11/20/2019   Procedure: ESOPHAGOGASTRODUODENOSCOPY (EGD) WITH PROPOFOL;  Surgeon: Virgel Manifold, MD;  Location: [facility] ENDOSCOPY;  Service: Endoscopy;  Laterality: N/A;   ESOPHAGOGASTRODUODENOSCOPY (EGD) WITH PROPOFOL N/A 11/23/2019   Procedure: ESOPHAGOGASTRODUODENOSCOPY (EGD) WITH PROPOFOL;  Surgeon: Virgel Manifold, MD;   Location: [facility] ENDOSCOPY;  Service: Endoscopy;  Laterality: N/A;   ESOPHAGOGASTRODUODENOSCOPY (EGD) WITH PROPOFOL N/A 01/03/2020   Procedure: ESOPHAGOGASTRODUODENOSCOPY (EGD) WITH PROPOFOL;  Surgeon: Lin Landsman, MD;  Location: [place];  Service: Endoscopy;  Laterality: N/A;   ESOPHAGOGASTRODUODENOSCOPY (EGD) WITH PROPOFOL N/A 07/09/2021   Procedure: ESOPHAGOGASTRODUODENOSCOPY (EGD) WITH PROPOFOL;  Surgeon: Virgel Manifold, MD;  Location: [facility] ENDOSCOPY;  Service: Endoscopy;  Laterality: N/A;   TUBAL LIGATION      Family History  Problem Relation Age of Onset   Hypertension Other    CAD Father    Colon cancer Father    Lung cancer Father    Pancreatic cancer Brother    Social History   Socioeconomic History   Marital status: Single    Spouse name: Not on file   Number of children: Not on file   Years of education: Not on file   Highest education level: Not on file  Occupational History   Not on file  Tobacco Use   Smoking status: Never   Smokeless tobacco: Never  Vaping Use   Vaping Use: Never used  Substance and Sexual Activity   Alcohol use: Yes    Comment: rarely   Drug use: Yes    Comment: prescribed oxycodone and oxycotton   Sexual activity: Not Currently  Other Topics Concern   Not on file  Social History Narrative   Not on file   Social Determinants of Health   Financial Resource Strain: Medium Risk (10/11/2019)   Overall Financial Resource Strain (CARDIA)    Difficulty of Paying Living Expenses: Somewhat hard  Food Insecurity: No Food Insecurity (10/11/2019)   Hunger Vital Sign    Worried About Running Out of Food in the Last Year: Never true    Ran Out of Food in the Last Year: Never true  Transportation Needs: No Transportation Needs (10/11/2019)   PRAPARE - Hydrologist (Medical): No    Lack of Transportation (Non-Medical): No  Physical Activity: Inactive (10/11/2019)   Exercise Vital Sign    Days of  Exercise per Week: 0 days    Minutes of Exercise per Session: 0 min  Stress: Stress Concern Present (10/11/2019)   [place]    Feeling of Stress : Rather much  Social Connections: Unknown (10/11/2019)   Social Connection and Isolation Panel [NHANES]    Frequency of Communication with Friends and Family: More than three times a week    Frequency of Social Gatherings with Friends and Family: More than three times a week    Attends Religious Services: Never    Marine scientist or Organizations: No    Attends Archivist Meetings: Not asked  Marital Status: Not on file  Intimate Partner Violence: Not At Risk (10/11/2019)   Humiliation, Afraid, Rape, and Kick questionnaire    Fear of Current or Ex-Partner: No    Emotionally Abused: No    Physically Abused: No    Sexually Abused: No    Allergies:  Allergies  Allergen Reactions   Vicodin [Hydrocodone-Acetaminophen] Rash   Amoxicillin Rash and Other (See Comments)    Has patient had a PCN reaction causing immediate rash, facial/tongue/throat swelling, SOB or lightheadedness with hypotension: Yes Has patient had a PCN reaction causing severe rash involving mucus membranes or skin necrosis: No Has patient had a PCN reaction that required hospitalization: No Has patient had a PCN reaction occurring within the last 10 years: No If all of the above answers are "NO", then may proceed with Cephalosporin use.    Gabapentin Rash    Current Medications: Current Outpatient Medications  Medication Sig Dispense Refill   carvedilol (COREG) 25 MG tablet Take 1 tablet (25 mg total) by mouth daily at 2 PM. 90 tablet 3   levothyroxine (SYNTHROID) 150 MCG tablet TAKE 1 TABLET BY MOUTH DAILY, 30 MINUTES BEFORE A MEAL 30 tablet 6   nitroGLYCERIN (NITROLINGUAL) 0.4 MG/SPRAY spray Place 1 spray under the tongue every 5 (five) minutes x 3 doses as needed for chest pain. 12 g  2   omeprazole (PRILOSEC) 20 MG capsule Take 1 capsule (20 mg total) by mouth daily. 90 capsule 0   Oxycodone HCl 10 MG TABS Take 10 mg by mouth 4 (four) times daily as needed.     OXYCONTIN 10 MG 12 hr tablet SMARTSIG:1 Tablet(s) By Mouth Every 12 Hours     ZTLIDO 1.8 % PTCH Apply 1 patch topically daily.     No current facility-administered medications for this visit.     Physical Exam: Blood pressure (!) 140/80, pulse 70, temperature 98 F (36.7 C), temperature source Tympanic, resp. rate 18, weight 211 lb 9.6 oz (96 kg), SpO2 98 %.   Physical Exam Constitutional:      General: She is not in acute distress.    Appearance: She is not diaphoretic.  HENT:     Head: Normocephalic and atraumatic.     Nose: Nose normal.     Mouth/Throat:     Pharynx: No oropharyngeal exudate.  Eyes:     General: No scleral icterus.    Pupils: Pupils are equal, round, and reactive to light.  Cardiovascular:     Rate and Rhythm: Normal rate and regular rhythm.     Heart sounds: No murmur heard. Pulmonary:     Effort: Pulmonary effort is normal. No respiratory distress.     Breath sounds: Normal breath sounds.  Abdominal:     General: Bowel sounds are normal.     Palpations: Abdomen is soft.     Comments: Splenomegaly  Musculoskeletal:        General: Normal range of motion.     Cervical back: Normal range of motion and neck supple.  Skin:    General: Skin is warm and dry.     Findings: No erythema.  Neurological:     Mental Status: She is alert and oriented to person, place, and time.     Cranial Nerves: No cranial nerve deficit.     Motor: No abnormal muscle tone.     Coordination: Coordination normal.  Psychiatric:        Mood and Affect: Affect normal.   Labs  Latest Ref Rng & Units 02/05/2023   11:55 AM 07/23/2022    2:17 PM 07/08/2022    9:17 AM  CBC  WBC 4.0 - 10.5 K/uL 1.6  1.9  2.9   Hemoglobin 12.0 - 15.0 g/dL 9.7  11.8  12.7   Hematocrit 36.0 - 46.0 % 29.4  34.9  38.1    Platelets 150 - 400 K/uL 44  47  60       Latest Ref Rng & Units 02/05/2023   11:55 AM 07/23/2022    2:17 PM 07/08/2022    9:17 AM  CMP  Glucose 70 - 99 mg/dL 111  105  100   BUN 6 - 20 mg/dL '10  12  12   '$ Creatinine 0.44 - 1.00 mg/dL 0.43  0.42  0.54   Sodium 135 - 145 mmol/L 138  140  141   Potassium 3.5 - 5.1 mmol/L 3.6  4.0  4.0   Chloride 98 - 111 mmol/L 109  111  107   CO2 22 - 32 mmol/L '23  26  24   '$ Calcium 8.9 - 10.3 mg/dL 8.4  8.8  9.1   Total Protein 6.5 - 8.1 g/dL 6.3  6.4  6.4   Total Bilirubin 0.3 - 1.2 mg/dL 0.9  0.8  0.9   Alkaline Phos 38 - 126 U/L 98  107    AST 15 - 41 U/L 28  33  28   ALT 0 - 44 U/L '19  19  16    '$ Lab Results  Component Value Date   IRON 36 02/05/2023   TIBC 469 (H) 02/05/2023   FERRITIN 4 (L) 02/05/2023     RADIOGRAPHIC STUDIES: I have personally reviewed the radiological images as listed and agreed with the findings in the report. No results found.

## 2023-02-06 LAB — AFP TUMOR MARKER: AFP, Serum, Tumor Marker: <1.8 ng/mL (ref 0.0–9.2)

## 2023-02-08 ENCOUNTER — Telehealth: Payer: Self-pay

## 2023-02-08 NOTE — Telephone Encounter (Signed)
-----   Message from Earlie Server, MD sent at 02/05/2023  7:53 PM EST ----- Iron level is low.  Please arrange patient to get IV venofer weekly x 4 Discussed with patient about this possibility during her visit.

## 2023-02-11 ENCOUNTER — Inpatient Hospital Stay: Payer: Medicaid Other

## 2023-02-11 VITALS — BP 121/66 | HR 71 | Temp 98.6°F | Resp 18

## 2023-02-11 DIAGNOSIS — R161 Splenomegaly, not elsewhere classified: Secondary | ICD-10-CM | POA: Diagnosis not present

## 2023-02-11 DIAGNOSIS — K7460 Unspecified cirrhosis of liver: Secondary | ICD-10-CM | POA: Diagnosis not present

## 2023-02-11 DIAGNOSIS — D500 Iron deficiency anemia secondary to blood loss (chronic): Secondary | ICD-10-CM

## 2023-02-11 DIAGNOSIS — D509 Iron deficiency anemia, unspecified: Secondary | ICD-10-CM | POA: Diagnosis not present

## 2023-02-11 DIAGNOSIS — D72819 Decreased white blood cell count, unspecified: Secondary | ICD-10-CM | POA: Diagnosis not present

## 2023-02-11 DIAGNOSIS — B1920 Unspecified viral hepatitis C without hepatic coma: Secondary | ICD-10-CM | POA: Diagnosis not present

## 2023-02-11 DIAGNOSIS — D696 Thrombocytopenia, unspecified: Secondary | ICD-10-CM | POA: Diagnosis not present

## 2023-02-11 MED ORDER — SODIUM CHLORIDE 0.9 % IV SOLN
Freq: Once | INTRAVENOUS | Status: AC
  Filled 2023-02-11: qty 250

## 2023-02-11 MED ORDER — SODIUM CHLORIDE 0.9 % IV SOLN
200.0000 mg | Freq: Once | INTRAVENOUS | Status: AC
  Administered 2023-02-11: 200 mg via INTRAVENOUS
  Filled 2023-02-11: qty 200

## 2023-02-11 NOTE — Patient Instructions (Signed)

## 2023-02-17 ENCOUNTER — Other Ambulatory Visit: Payer: Self-pay

## 2023-02-17 ENCOUNTER — Encounter: Payer: Self-pay | Admitting: *Deleted

## 2023-02-17 DIAGNOSIS — N644 Mastodynia: Secondary | ICD-10-CM | POA: Diagnosis not present

## 2023-02-17 DIAGNOSIS — R222 Localized swelling, mass and lump, trunk: Secondary | ICD-10-CM | POA: Diagnosis not present

## 2023-02-17 DIAGNOSIS — M542 Cervicalgia: Secondary | ICD-10-CM | POA: Diagnosis not present

## 2023-02-17 DIAGNOSIS — I2510 Atherosclerotic heart disease of native coronary artery without angina pectoris: Secondary | ICD-10-CM | POA: Insufficient documentation

## 2023-02-17 DIAGNOSIS — E039 Hypothyroidism, unspecified: Secondary | ICD-10-CM | POA: Diagnosis not present

## 2023-02-17 DIAGNOSIS — R0789 Other chest pain: Secondary | ICD-10-CM | POA: Diagnosis not present

## 2023-02-17 DIAGNOSIS — I10 Essential (primary) hypertension: Secondary | ICD-10-CM | POA: Diagnosis not present

## 2023-02-17 DIAGNOSIS — Z79899 Other long term (current) drug therapy: Secondary | ICD-10-CM | POA: Insufficient documentation

## 2023-02-17 DIAGNOSIS — Z79891 Long term (current) use of opiate analgesic: Secondary | ICD-10-CM | POA: Diagnosis not present

## 2023-02-17 DIAGNOSIS — G894 Chronic pain syndrome: Secondary | ICD-10-CM | POA: Diagnosis not present

## 2023-02-17 NOTE — ED Triage Notes (Signed)
Pt reports right breast pain for 1 week.  Pt states she reached over the sofa to grab something and felt something pull in breast.  No chest pain.  Pt alert  speech clear.

## 2023-02-18 ENCOUNTER — Inpatient Hospital Stay: Payer: Medicaid Other | Attending: Oncology

## 2023-02-18 ENCOUNTER — Emergency Department
Admission: EM | Admit: 2023-02-18 | Discharge: 2023-02-18 | Disposition: A | Discharge status: Home / Self Care | Payer: Medicaid Other | Attending: Emergency Medicine | Admitting: Emergency Medicine

## 2023-02-18 ENCOUNTER — Emergency Department: Payer: Medicaid Other

## 2023-02-18 VITALS — BP 128/76 | HR 71 | Temp 97.8°F

## 2023-02-18 DIAGNOSIS — R0789 Other chest pain: Secondary | ICD-10-CM

## 2023-02-18 DIAGNOSIS — D509 Iron deficiency anemia, unspecified: Secondary | ICD-10-CM | POA: Diagnosis not present

## 2023-02-18 DIAGNOSIS — D500 Iron deficiency anemia secondary to blood loss (chronic): Secondary | ICD-10-CM

## 2023-02-18 DIAGNOSIS — R222 Localized swelling, mass and lump, trunk: Secondary | ICD-10-CM | POA: Diagnosis not present

## 2023-02-18 LAB — BASIC METABOLIC PANEL
Anion gap: 7 (ref 5–15)
BUN: 8 mg/dL (ref 6–20)
CO2: 24 mmol/L (ref 22–32)
Calcium: 8.8 mg/dL — ABNORMAL LOW (ref 8.9–10.3)
Chloride: 108 mmol/L (ref 98–111)
Creatinine, Ser: 0.55 mg/dL (ref 0.44–1.00)
GFR, Estimated: >60 mL/min (ref >60)
Glucose, Bld: 116 mg/dL — ABNORMAL HIGH (ref 70–99)
Potassium: 3.7 mmol/L (ref 3.5–5.1)
Sodium: 139 mmol/L (ref 135–145)

## 2023-02-18 LAB — CBC
HCT: 33 % — ABNORMAL LOW (ref 36.0–46.0)
Hemoglobin: 10.8 g/dL — ABNORMAL LOW (ref 12.0–15.0)
MCH: 27.7 pg (ref 26.0–34.0)
MCHC: 32.7 g/dL (ref 30.0–36.0)
MCV: 84.6 fL (ref 80.0–100.0)
Platelets: 41 10*3/uL — ABNORMAL LOW (ref 150–400)
RBC: 3.9 MIL/uL (ref 3.87–5.11)
RDW: 13.3 % (ref 11.5–15.5)
WBC: 2 10*3/uL — ABNORMAL LOW (ref 4.0–10.5)
nRBC: 0 % (ref 0.0–0.2)

## 2023-02-18 LAB — PROTIME-INR
INR: 1.5 — ABNORMAL HIGH (ref 0.8–1.2)
Prothrombin Time: 17.7 seconds — ABNORMAL HIGH (ref 11.4–15.2)

## 2023-02-18 LAB — CK: Total CK: 52 U/L (ref 38–234)

## 2023-02-18 MED ORDER — OXYCODONE-ACETAMINOPHEN 5-325 MG PO TABS
1.0000 | ORAL_TABLET | Freq: Once | ORAL | Status: AC
  Administered 2023-02-18: 1 via ORAL
  Filled 2023-02-18: qty 1

## 2023-02-18 MED ORDER — SODIUM CHLORIDE 0.9 % IV SOLN
200.0000 mg | Freq: Once | INTRAVENOUS | Status: AC
  Administered 2023-02-18: 200 mg via INTRAVENOUS
  Filled 2023-02-18: qty 200

## 2023-02-18 MED ORDER — KETOROLAC TROMETHAMINE 30 MG/ML IJ SOLN
10.0000 mg | Freq: Once | INTRAMUSCULAR | Status: AC
  Administered 2023-02-18: 9.9 mg via INTRAVENOUS
  Filled 2023-02-18: qty 1

## 2023-02-18 MED ORDER — SODIUM CHLORIDE 0.9 % IV SOLN
INTRAVENOUS | Status: DC | PRN
  Filled 2023-02-18: qty 250

## 2023-02-18 NOTE — ED Provider Notes (Signed)
[facility] Provider Note    Event Date/Time   First MD Initiated Contact with Patient 02/18/23 0144     (approximate)   History   Breast Pain   HPI  Brandi Bright is a 52 y.o. female who presents to the ED from home with a chief complaint of right breast pain x 1 week.  Patient with a history of chronic neutropenia and thrombocytopenia followed by oncology.  She was reaching over her sofa and felt something "pop" in her right breast.  Pain was feeling better over the week but starting to feel worse.  Pain exacerbated by movement and stretching her right arm.  Denies fever/chills, cough, shortness of breath, abdominal pain, nausea, vomiting or dizziness.     Past Medical History   Past Medical History:  Diagnosis Date   Arthritis    Back pain    Chronic knee pain    Cirrhosis ([place])    Gout    Hypothyroidism    IDA (iron deficiency anemia) 01/16/2020   MI, old    Thyroid disease      Active Problem List   Patient Active Problem List   Diagnosis Date Noted   Cough 11/16/2022   Nonintractable headache 11/16/2022   Dyslipidemia 08/02/2022   Pleurisy 01/13/2022   History of colonic polyps    Rectal polyp    Cecal polyp    Polyp of transverse colon    Sinusitis 04/07/2021   Oropharyngeal dysphagia 04/07/2021   Neck arthropathy 12/24/2020   Annual physical exam 10/11/2020   Encounter for screening for malignant neoplasm of breast 10/11/2020   Morbid obesity ([place]) 10/11/2020   RLQ abdominal pain 08/26/2020   Close exposure to COVID-19 virus 08/26/2020   Bruising 08/26/2020   Obesity (BMI 35.0-39.9 without comorbidity) 06/07/2020   Acute otitis externa of both ears 06/07/2020   Angina of effort 04/24/2020   Neutropenia ([place]) 04/24/2020   Iron deficiency anemia due to chronic blood loss 01/16/2020   Elevated troponin 01/10/2020   Portal hypertension ([place])    GI bleeding 10/11/2019   Dysphagia    Gastritis without bleeding    GI bleed  06/24/2019   Iron deficiency anemia 10/31/2018   Low vitamin B12 level 10/31/2018   Low serum copper for age 25/18/2019   Positive ANA (antinuclear antibody) 10/31/2018   Other cirrhosis of liver (HCC)    Esophageal varices without bleeding (HCC)    Stomach irritation    Gastric polyp    Normocytic anemia 09/23/2018   Thrombocytopenia ([place]) 09/23/2018   Leukopenia 09/23/2018   Splenomegaly 09/23/2018   Colon cancer screening    Benign neoplasm of descending colon    Benign neoplasm of ascending colon    Family history of malignant neoplasm of gastrointestinal tract    Benign neoplasm of transverse colon    Polyp of sigmoid colon    Chest wall pain 12/29/2017   Anxiety and depression 04/30/2014   Chronic pain with drug dependence ([place]) 04/30/2014   Coronary artery disease 04/30/2014   Degenerative disc disease 04/30/2014   Hyperlipidemia 04/30/2014   Essential hypertension 04/30/2014   Hypothyroidism 04/30/2014     Past Surgical History   Past Surgical History:  Procedure Laterality Date   CARDIAC CATHETERIZATION     CESAREAN SECTION     COLONOSCOPY WITH PROPOFOL N/A 03/16/2018   Procedure: COLONOSCOPY WITH PROPOFOL;  Surgeon: Virgel Manifold, MD;  Location: [facility] ENDOSCOPY;  Service: Endoscopy;  Laterality: N/A;   COLONOSCOPY WITH  PROPOFOL N/A 07/09/2021   Procedure: COLONOSCOPY WITH PROPOFOL;  Surgeon: Virgel Manifold, MD;  Location: [facility] ENDOSCOPY;  Service: Endoscopy;  Laterality: N/A;   ESOPHAGOGASTRODUODENOSCOPY N/A 10/11/2019   Procedure: ESOPHAGOGASTRODUODENOSCOPY (EGD);  Surgeon: Lin Landsman, MD;  Location: [facility] ENDOSCOPY;  Service: Gastroenterology;  Laterality: N/A;   ESOPHAGOGASTRODUODENOSCOPY (EGD) WITH PROPOFOL N/A 10/27/2018   Procedure: ESOPHAGOGASTRODUODENOSCOPY (EGD) WITH PROPOFOL;  Surgeon: Virgel Manifold, MD;  Location: [facility] ENDOSCOPY;  Service: Endoscopy;  Laterality: N/A;   ESOPHAGOGASTRODUODENOSCOPY (EGD) WITH PROPOFOL N/A  06/25/2019   Procedure: ESOPHAGOGASTRODUODENOSCOPY (EGD) WITH PROPOFOL;  Surgeon: Jonathon Bellows, MD;  Location[facility] ENDOSCOPY;  Service: Gastroenterology;  Laterality: N/A;   ESOPHAGOGASTRODUODENOSCOPY (EGD) WITH PROPOFOL N/A 09/12/2019   Procedure: ESOPHAGOGASTRODUODENOSCOPY (EGD) WITH PROPOFOL;  Surgeon: Lucilla Lame, MD;  Location: [facility] ENDOSCOPY;  Service: Endoscopy;  Laterality: N/A;   ESOPHAGOGASTRODUODENOSCOPY (EGD) WITH PROPOFOL N/A 11/20/2019   Procedure: ESOPHAGOGASTRODUODENOSCOPY (EGD) WITH PROPOFOL;  Surgeon: Virgel Manifold, MD;  Location: [facility] ENDOSCOPY;  Service: Endoscopy;  Laterality: N/A;   ESOPHAGOGASTRODUODENOSCOPY (EGD) WITH PROPOFOL N/A 11/23/2019   Procedure: ESOPHAGOGASTRODUODENOSCOPY (EGD) WITH PROPOFOL;  Surgeon: Virgel Manifold, MD;  Location: [facility] ENDOSCOPY;  Service: Endoscopy;  Laterality: N/A;   ESOPHAGOGASTRODUODENOSCOPY (EGD) WITH PROPOFOL N/A 01/03/2020   Procedure: ESOPHAGOGASTRODUODENOSCOPY (EGD) WITH PROPOFOL;  Surgeon: Lin Landsman, MD;  Location: [place];  Service: Endoscopy;  Laterality: N/A;   ESOPHAGOGASTRODUODENOSCOPY (EGD) WITH PROPOFOL N/A 07/09/2021   Procedure: ESOPHAGOGASTRODUODENOSCOPY (EGD) WITH PROPOFOL;  Surgeon: Virgel Manifold, MD;  Location: [facility] ENDOSCOPY;  Service: Endoscopy;  Laterality: N/A;   TUBAL LIGATION       Home Medications   Prior to Admission medications   Medication Sig Start Date End Date Taking? Authorizing Provider  carvedilol (COREG) 25 MG tablet Take 1 tablet (25 mg total) by mouth daily at 2 PM. 10/01/22   Cletis Athens, MD  levothyroxine (SYNTHROID) 150 MCG tablet TAKE 1 TABLET BY MOUTH DAILY, 30 MINUTES BEFORE A MEAL 07/09/22   Cletis Athens, MD  nitroGLYCERIN (NITROLINGUAL) 0.4 MG/SPRAY spray Place 1 spray under the tongue every 5 (five) minutes x 3 doses as needed for chest pain. 04/24/20   Cletis Athens, MD  omeprazole (PRILOSEC) 20 MG capsule Take 1 capsule (20 mg total) by mouth daily.  09/30/22   Thornton Park, MD  Oxycodone HCl 10 MG TABS Take 10 mg by mouth 4 (four) times daily as needed. 10/14/20   [provider]  OXYCONTIN 10 MG 12 hr tablet SMARTSIG:1 Tablet(s) By Mouth Every 12 Hours 06/09/22   [provider]  ZTLIDO 1.8 % PTCH Apply 1 patch topically daily. 07/10/22   [provider]     Allergies  Vicodin [hydrocodone-acetaminophen], Amoxicillin, and Gabapentin   Family History   Family History  Problem Relation Age of Onset   Hypertension Other    CAD Father    Colon cancer Father    Lung cancer Father    Pancreatic cancer Brother      Physical Exam  Triage Vital Signs: ED Triage Vitals  Enc Vitals Group     BP 02/17/23 2144 (!) 155/82     Pulse Rate 02/17/23 2144 78     Resp 02/17/23 2144 20     Temp 02/17/23 2144 98.1 F (36.7 C)     Temp Source 02/17/23 2144 Oral     SpO2 02/17/23 2144 95 %     Weight 02/17/23 2141 210 lb (95.3 kg)     Height 02/17/23 2141 '4\' 9"'$  (1.448  m)     Head Circumference --      Peak Flow --      Pain Score 02/17/23 2141 9     Pain Loc --      Pain Edu? --      Excl. in [place]? --     Updated Vital Signs: BP (!) 141/69   Pulse 66   Temp 97.8 F (36.6 C) (Oral)   Resp 20   Ht '4\' 9"'$  (1.448 m)   Wt 95.3 kg   LMP  (LMP Unknown) Comment: last menstrual 9 years ago, tubal ligation  SpO2 99%   BMI 45.44 kg/m    General: Awake, no distress.  CV:  RRR.  Good peripheral perfusion.  Resp:  Normal effort.  CTAB.  Right anterior chest above breast tender to palpation and appears mildly swollen compared to the left.  No ecchymosis. Abd:  No distention.  Other:  Right breast: No palpable masses.  No nipple discharge.  No axillary lymphadenopathy.   ED Results / Procedures / Treatments  Labs (all labs ordered are listed, but only abnormal results are displayed) Labs Reviewed  CBC - Abnormal; Notable for the following components:      Result Value   WBC 2.0 (*)    Hemoglobin 10.8  (*)    HCT 33.0 (*)    Platelets 41 (*)    All other components within normal limits  BASIC METABOLIC PANEL - Abnormal; Notable for the following components:   Glucose, Bld 116 (*)    Calcium 8.8 (*)    All other components within normal limits  PROTIME-INR - Abnormal; Notable for the following components:   Prothrombin Time 17.7 (*)    INR 1.5 (*)    All other components within normal limits  CK     EKG  None  RADIOLOGY I have independently visualized and interpreted patient's soft tissue chest ultrasound as well as noted the radiology interpretation:  Ultrasound: No acute findings  Official radiology report(s): [place] CHEST SOFT TISSUE  Result Date: 02/18/2023 CLINICAL DATA:  [id number]. Right anterior chest swelling and pain superior to the right breast. EXAM: ULTRASOUND OF CHEST SOFT TISSUES TECHNIQUE: Ultrasound examination of the chest wall soft tissues was performed in the area of clinical concern. COMPARISON:  None Available. FINDINGS: Diuretic sonographic imaging in the area of concern described above, reveals no sonographically visible underlying solid or cystic mass or pathologic chest wall fluid collections. IMPRESSION: No sonographic abnormality in the area of concern. Electronically Signed   By: Telford Nab M.D.   On: 02/18/2023 04:09     PROCEDURES:  Critical Care performed: No  Procedures   MEDICATIONS ORDERED IN ED: Medications  ketorolac (TORADOL) 30 MG/ML injection 9.9 mg (9.9 mg Intravenous Given 02/18/23 0230)  oxyCODONE-acetaminophen (PERCOCET/ROXICET) 5-325 MG per tablet 1 tablet (1 tablet Oral Given 02/18/23 0229)     IMPRESSION / MDM / ASSESSMENT AND PLAN / ED COURSE  I reviewed the triage vital signs and the nursing notes.                             52 year old female presenting with right anterior chest wall pain.  Differential diagnosis includes but is not limited to hematoma, muscle strain, rhabdomyolysis, etc.  I have personally reviewed patient's  records and note her oncology office visit from 02/05/2023.  Patient's presentation is most consistent with acute complicated illness / injury requiring diagnostic workup.  Given  patient's history of thrombocytopenia, will check lab work and obtain soft tissue ultrasound.  Administer IV ketorolac.  With Percocet for pain.  Will reassess.  Clinical Course as of 02/18/23 0452  Thu Feb 18, 2023  0409 Laboratory results demonstrate stable WBC and platelets, normal electrolytes, negative CK, INR 1.5.  Pain improved, awaiting ultrasound results. [JS]  0416 Ultrasound unremarkable.  Reluctant to prescribe NSAIDs given patient's history of thrombocytopenia.  Patient has Percocet at home which she can take.  Strict return precautions given.  Patient verbalizes understanding and agrees with plan of care. [JS]    Clinical Course User Index [JS] Paulette Blanch, MD     FINAL CLINICAL IMPRESSION(S) / ED DIAGNOSES   Final diagnoses:  Right-sided chest wall pain     Rx / DC Orders   ED Discharge Orders     None        Note:  This document was prepared using Dragon voice recognition software and may include unintentional dictation errors.   Paulette Blanch, MD 02/18/23 [id number]

## 2023-02-18 NOTE — ED Notes (Signed)
Provider notified regarding pt WBC and plt count

## 2023-02-18 NOTE — ED Notes (Signed)
Acuity changed per Beather Arbour, MD request

## 2023-02-18 NOTE — Discharge Instructions (Signed)
Apply moist heat or cool compresses to the affected area several times daily.  Use whichever temperature makes you feel better.  You may continue to take Percocet as needed for pain.  Return to the ER for worsening symptoms, persistent vomiting, difficulty breathing or other concerns.

## 2023-02-18 NOTE — ED Notes (Signed)
Pt rpting R side breast pain

## 2023-02-25 ENCOUNTER — Inpatient Hospital Stay: Payer: Medicaid Other

## 2023-02-25 VITALS — BP 129/77 | HR 74 | Temp 99.1°F | Resp 16

## 2023-02-25 DIAGNOSIS — D500 Iron deficiency anemia secondary to blood loss (chronic): Secondary | ICD-10-CM

## 2023-02-25 DIAGNOSIS — D509 Iron deficiency anemia, unspecified: Secondary | ICD-10-CM | POA: Diagnosis not present

## 2023-02-25 MED ORDER — SODIUM CHLORIDE 0.9 % IV SOLN
200.0000 mg | Freq: Once | INTRAVENOUS | Status: AC
  Administered 2023-02-25: 200 mg via INTRAVENOUS
  Filled 2023-02-25: qty 200

## 2023-02-25 MED ORDER — SODIUM CHLORIDE 0.9 % IV SOLN
Freq: Once | INTRAVENOUS | Status: AC
  Filled 2023-02-25: qty 250

## 2023-03-04 ENCOUNTER — Inpatient Hospital Stay: Payer: Medicaid Other

## 2023-03-04 VITALS — BP 125/74 | HR 68 | Temp 97.4°F | Resp 18

## 2023-03-04 DIAGNOSIS — D509 Iron deficiency anemia, unspecified: Secondary | ICD-10-CM | POA: Diagnosis not present

## 2023-03-04 DIAGNOSIS — D500 Iron deficiency anemia secondary to blood loss (chronic): Secondary | ICD-10-CM

## 2023-03-04 MED ORDER — SODIUM CHLORIDE 0.9 % IV SOLN
200.0000 mg | Freq: Once | INTRAVENOUS | Status: AC
  Administered 2023-03-04: 200 mg via INTRAVENOUS
  Filled 2023-03-04: qty 200

## 2023-03-04 MED ORDER — SODIUM CHLORIDE 0.9 % IV SOLN
INTRAVENOUS | Status: DC
  Filled 2023-03-04: qty 250

## 2023-03-04 NOTE — Patient Instructions (Signed)

## 2023-03-31 ENCOUNTER — Emergency Department: Payer: Medicaid Other

## 2023-03-31 ENCOUNTER — Other Ambulatory Visit: Payer: Self-pay

## 2023-03-31 ENCOUNTER — Observation Stay
Admission: EM | Admit: 2023-03-31 | Discharge: 2023-04-01 | Disposition: A | Discharge status: Home / Self Care | Payer: Medicaid Other | Attending: Internal Medicine | Admitting: Internal Medicine

## 2023-03-31 ENCOUNTER — Encounter: Payer: Self-pay | Admitting: *Deleted

## 2023-03-31 DIAGNOSIS — Z6841 Body Mass Index (BMI) 40.0 and over, adult: Secondary | ICD-10-CM | POA: Insufficient documentation

## 2023-03-31 DIAGNOSIS — R531 Weakness: Secondary | ICD-10-CM | POA: Diagnosis not present

## 2023-03-31 DIAGNOSIS — K219 Gastro-esophageal reflux disease without esophagitis: Secondary | ICD-10-CM | POA: Diagnosis not present

## 2023-03-31 DIAGNOSIS — G459 Transient cerebral ischemic attack, unspecified: Principal | ICD-10-CM

## 2023-03-31 DIAGNOSIS — H538 Other visual disturbances: Secondary | ICD-10-CM | POA: Diagnosis not present

## 2023-03-31 DIAGNOSIS — E039 Hypothyroidism, unspecified: Secondary | ICD-10-CM

## 2023-03-31 DIAGNOSIS — I2510 Atherosclerotic heart disease of native coronary artery without angina pectoris: Secondary | ICD-10-CM | POA: Diagnosis not present

## 2023-03-31 DIAGNOSIS — I10 Essential (primary) hypertension: Secondary | ICD-10-CM

## 2023-03-31 DIAGNOSIS — R29810 Facial weakness: Secondary | ICD-10-CM | POA: Diagnosis not present

## 2023-03-31 DIAGNOSIS — Z79899 Other long term (current) drug therapy: Secondary | ICD-10-CM | POA: Insufficient documentation

## 2023-03-31 LAB — COMPREHENSIVE METABOLIC PANEL
ALT: 24 U/L (ref 0–44)
AST: 47 U/L — ABNORMAL HIGH (ref 15–41)
Albumin: 3.4 g/dL — ABNORMAL LOW (ref 3.5–5.0)
Alkaline Phosphatase: 113 U/L (ref 38–126)
Anion gap: 4 — ABNORMAL LOW (ref 5–15)
BUN: 9 mg/dL (ref 6–20)
CO2: 25 mmol/L (ref 22–32)
Calcium: 8.8 mg/dL — ABNORMAL LOW (ref 8.9–10.3)
Chloride: 112 mmol/L — ABNORMAL HIGH (ref 98–111)
Creatinine, Ser: 0.58 mg/dL (ref 0.44–1.00)
GFR, Estimated: >60 mL/min (ref >60)
Glucose, Bld: 108 mg/dL — ABNORMAL HIGH (ref 70–99)
Potassium: 3.6 mmol/L (ref 3.5–5.1)
Sodium: 141 mmol/L (ref 135–145)
Total Bilirubin: 1.2 mg/dL (ref 0.3–1.2)
Total Protein: 6.5 g/dL (ref 6.5–8.1)

## 2023-03-31 LAB — DIFFERENTIAL
Abs Immature Granulocytes: 0.01 10*3/uL (ref 0.00–0.07)
Basophils Absolute: 0 10*3/uL (ref 0.0–0.1)
Basophils Relative: 1 %
Eosinophils Absolute: 0.1 10*3/uL (ref 0.0–0.5)
Eosinophils Relative: 4 %
Immature Granulocytes: 0 %
Lymphocytes Relative: 36 %
Lymphs Abs: 0.9 10*3/uL (ref 0.7–4.0)
Monocytes Absolute: 0.1 10*3/uL (ref 0.1–1.0)
Monocytes Relative: 5 %
Neutro Abs: 1.3 10*3/uL — ABNORMAL LOW (ref 1.7–7.7)
Neutrophils Relative %: 54 %
Smear Review: NORMAL

## 2023-03-31 LAB — CBC
HCT: 39.8 % (ref 36.0–46.0)
Hemoglobin: 12.7 g/dL (ref 12.0–15.0)
MCH: 27.9 pg (ref 26.0–34.0)
MCHC: 31.9 g/dL (ref 30.0–36.0)
MCV: 87.5 fL (ref 80.0–100.0)
Platelets: 39 10*3/uL — ABNORMAL LOW (ref 150–400)
RBC: 4.55 MIL/uL (ref 3.87–5.11)
RDW: 15.2 % (ref 11.5–15.5)
WBC: 2.5 10*3/uL — ABNORMAL LOW (ref 4.0–10.5)
nRBC: 0 % (ref 0.0–0.2)

## 2023-03-31 LAB — PROTIME-INR
INR: 1.4 — ABNORMAL HIGH (ref 0.8–1.2)
Prothrombin Time: 16.7 seconds — ABNORMAL HIGH (ref 11.4–15.2)

## 2023-03-31 LAB — ETHANOL: Alcohol, Ethyl (B): <10 mg/dL (ref <10)

## 2023-03-31 LAB — CBG MONITORING, ED: Glucose-Capillary: 102 mg/dL — ABNORMAL HIGH (ref 70–99)

## 2023-03-31 LAB — APTT: aPTT: 34 seconds (ref 24–36)

## 2023-03-31 MED ORDER — SODIUM CHLORIDE 0.9% FLUSH
3.0000 mL | Freq: Once | INTRAVENOUS | Status: AC
  Administered 2023-03-31: 3 mL via INTRAVENOUS

## 2023-03-31 NOTE — ED Provider Notes (Signed)
[facility] Provider Note    Event Date/Time   First MD Initiated Contact with Patient 03/31/23 2130     (approximate)   History   Headache   HPI  Brandi Bright is a 52 y.o. female who presents to the emergency department today because of concerns for facial drooping and vision changes.  Symptoms started roughly 4 hours prior to my evaluation.  Patient was with her daughter at the time when her daughter noticed that she started having right sided facial droop.  The patient states she developed a right sided headache at that time and left sided vision change.  She feels like there is a film over her eye.  At the time my exam no further facial weakness although the patient continues to have blurred vision.     Physical Exam   Triage Vital Signs: ED Triage Vitals  Enc Vitals Group     BP 03/31/23 2116 (!) 161/93     Pulse Rate 03/31/23 2116 90     Resp 03/31/23 2116 20     Temp 03/31/23 2116 98.2 F (36.8 C)     Temp Source 03/31/23 2116 Oral     SpO2 03/31/23 2116 97 %     Weight 03/31/23 2116 210 lb (95.3 kg)     Height 03/31/23 2116  (1.448 m)     Head Circumference --      Peak Flow --      Pain Score 03/31/23 2121 5     Pain Loc --      Pain Edu? --      Excl. in [place]? --     Most recent vital signs: Vitals:   03/31/23 2154 03/31/23 2230  BP: (!) 146/84 139/78  Pulse: 70 70  Resp: 18 18  Temp:    SpO2: 96% 96%   General: Awake, alert, oriented. CV:  Good peripheral perfusion. Regular rate and rhythm. Resp:  Normal effort. Lungs clear. Abd:  No distention.    ED Results / Procedures / Treatments   Labs (all labs ordered are listed, but only abnormal results are displayed) Labs Reviewed  PROTIME-INR - Abnormal; Notable for the following components:      Result Value   Prothrombin Time 16.7 (*)    INR 1.4 (*)    All other components within normal limits  CBC - Abnormal; Notable for the following components:   WBC 2.5 (*)     Platelets 39 (*)    All other components within normal limits  COMPREHENSIVE METABOLIC PANEL - Abnormal; Notable for the following components:   Chloride 112 (*)    Glucose, Bld 108 (*)    Calcium 8.8 (*)    Albumin 3.4 (*)    AST 47 (*)    Anion gap 4 (*)    All other components within normal limits  DIFFERENTIAL - Abnormal; Notable for the following components:   Neutro Abs 1.3 (*)    All other components within normal limits  CBG MONITORING, ED - Abnormal; Notable for the following components:   Glucose-Capillary 102 (*)    All other components within normal limits  APTT  ETHANOL  CBG MONITORING, ED     EKG  I, Phineas Semen, attending physician, personally viewed and interpreted this EKG  EKG Time: 2147 Rate: 70 Rhythm: sinus rhythm Axis: left axis deviation Intervals: qtc 494 QRS: RBBB ST changes: no st elevation Impression: abnormal ekg   RADIOLOGY I independently interpreted and  visualized the CT head. My interpretation: No bleed Radiology interpretation:  IMPRESSION:  1. No acute intracranial abnormality.  2. ASPECTS is 10.      PROCEDURES:  Critical Care performed: No   MEDICATIONS ORDERED IN ED: Medications  sodium chloride flush (NS) 0.9 % injection 3 mL (3 mLs Intravenous Given 03/31/23 2155)     IMPRESSION / MDM / ASSESSMENT AND PLAN / ED COURSE  I reviewed the triage vital signs and the nursing notes.                              Differential diagnosis includes, but is not limited to, CVA, TIA, bells palsy  Patient's presentation is most consistent with acute presentation with potential threat to life or bodily function.   The patient is on the cardiac monitor to evaluate for evidence of arrhythmia and/or significant heart rate changes.  Patient presented to the emergency department today because of concerns for facial droop and blurred vision.  At the time my exam facial droop had improved however patient continued to have  blurred vision.  Given concern for blurred vision code stroke was called.  Neurology evaluated the patient did not feel she was candidate for thrombolytics additionally thought patient signs for not consistent with LVO.  They did recommend inpatient mission for further stroke workup.      FINAL CLINICAL IMPRESSION(S) / ED DIAGNOSES   Final diagnoses:  Blurred vision  Facial weakness    Note:  This document was prepared using Dragon voice recognition software and may include unintentional dictation errors.    Phineas Semen, MD 04/01/23 1500

## 2023-03-31 NOTE — ED Notes (Signed)
This RN established care of patient after CT. Unknown arrival time in CT.

## 2023-03-31 NOTE — Consult Note (Signed)
[facility] Tele[facility]urology Consult Services   Patient Name:   Brandi Bright, Brandi Bright Date of Birth:   [birth year] Identification Number:   MRN - [id number] Date of Service:   03/31/2023 21:49:44  Diagnosis:       R29.810 - Facial numbness/ Facial weakness  Impression:      Transient Rt facial droop. No exam findings. Etiology: rule out stroke. HCT showed no acute pathology. Patient is not a candidate for IV thrombolytics, outside the window and no disabling symptoms. Low suspicion for LVO.  Recommend admission for stroke workup as below.   Our recommendations are outlined below.  Recommendations:        Stroke/Telemetry Floor       Neuro Checks       Bedside Swallow Eval       DVT Prophylaxis       IV Fluids, Normal Saline       Euglycemia and Avoid Hyperthermia (PRN Acetaminophen)       Initiate dual antiplatelet therapy with Aspirin 81 mg daily and Clopidogrel 75 mg daily.       Antihypertensives PRN if Blood pressure is greater than 220/120 or there is a concern for End organ damage/contraindications for permissive HTN. If blood pressure is greater than 220/120 give labetalol PO or IV or Vasotec IV with a goal of 15% reduction in BP during the first 24 hours.       Brain MRI without gadolinium       Transthoracic echo       Hemoglobin A1C and lipid panel       Infectious workup  Sign Out:       Discussed with Emergency Department Provider    ------------------------------------------------------------------------------  Advanced Imaging: Advanced Imaging Deferred because:  Non-disabling symptoms as verified by the patient; no cortical signs so not consistent with LVO   Metrics: Last Known Well: 03/31/2023 17:00:00 [facility] Notification Time: 03/31/2023 21:49:44 Arrival Time: 03/31/2023 21:12:00 Stamp Time: 03/31/2023 21:49:44 Initial Response Time: 03/31/2023 21:54:08 Symptoms: right facial droop and blurry vision. Initial patient interaction:  03/31/2023 22:02:22 NIHSS Assessment Completed: 03/31/2023 22:01:37 Patient is not a candidate for Thrombolytic. Thrombolytic Medical Decision: 03/31/2023 22:01:45 Patient was not deemed candidate for Thrombolytic because of following reasons: Last Well Known Above 4.5 Hours.  I personally Reviewed the CT Head and it Showed no hemorrhage.  Primary Provider Notified of Diagnostic Impression and Management Plan on: 03/31/2023 22:05:46    ------------------------------------------------------------------------------  History of Present Illness: Patient is a 52 year old Female.  Patient was brought by private transportation with symptoms of right facial droop and blurry vision. 52 years old woman presenting for evaluation of acute onset of right facial droop. Last known well time is 1700 today. Patient noted by her daughter with Rt facial droop when looking at the mirror. Patient reported Lt eye blurry vision. Denied history of stroke. Patient is not on antiplatelet or anticoagulation therapy.   Past Medical History:      There is no history of Hypertension [place] PMH:  Hypothyroidism, Rt> Lt OA  Medications:  No Anticoagulant use  No Antiplatelet use Reviewed EMR for current medications  Allergies:  Reviewed  Social History: Smoking: No Alcohol Use: No Drug Use: No  Family History:  There Is Family History Of:non contributory There is no family history of premature cerebrovascular disease pertinent to this consultation  ROS : 14 Points Review of Systems was performed and was negative except mentioned in HPI.  Past Surgical History: There Is No Surgical History  Contributory To Today's Visit    Examination: BP(146/82), Pulse(74), Blood Glucose(102) 1A: Level of Consciousness - Alert; keenly responsive + 0 1B: Ask Month and Age - Both Questions Right + 0 1C: Blink Eyes & Squeeze Hands - Performs Both Tasks + 0 2: Test Horizontal Extraocular Movements - Normal +  0 3: Test Visual Fields - No Visual Loss + 0 4: Test Facial Palsy (Use Grimace if Obtunded) - Normal symmetry + 0 5A: Test Left Arm Motor Drift - No Drift for 10 Seconds + 0 5B: Test Right Arm Motor Drift - No Drift for 10 Seconds + 0 6A: Test Left Leg Motor Drift - No Drift for 5 Seconds + 0 6B: Test Right Leg Motor Drift - No Drift for 5 Seconds + 0 7: Test Limb Ataxia (FNF/Heel-Shin) - No Ataxia + 0 8: Test Sensation - Normal; No sensory loss + 0 9: Test Language/Aphasia - Normal; No aphasia + 0 10: Test Dysarthria - Normal + 0 11: Test Extinction/Inattention - No abnormality + 0  NIHSS Score: 0   Pre-Morbid Modified Rankin Scale: 0 Points = No symptoms at all  Spoke with : ED Provider  Patient/Family was informed the Neurology Consult would occur via Tele[facility] consult by way of interactive audio and video telecommunications and consented to receiving care in this manner.   Patient is being evaluated for possible acute neurologic impairment and high probability of imminent or life-threatening deterioration. I spent total of 35 minutes providing care to this patient, including time for face to face visit via telemedicine, review of medical records, imaging studies and discussion of findings with providers, the patient and/or family.   Dr Olga Millers Lew Prout   [facility] For Inpatient follow-up with [facility] physician please call [facility] [phone]. This is not an outpatient service. Post hospital discharge, please contact hospital directly.  Please do not communicate with [facility] physicians via secure chat. If you have any questions, Please contact [facility]. Please call or reconsult our service if there are any clinical or diagnostic changes.

## 2023-03-31 NOTE — ED Triage Notes (Addendum)
Pt ambulatory to triage.  Pt reports a right side headache and left eye blurred vision.  Family states pt had a facial droop today.  None noted in triage.  No slurred speech.  No dizziness no chest pain or sob.  Pt alert  speech clear. Fsbs 102 in triage.  Sx began at 1730 today

## 2023-03-31 NOTE — ED Notes (Signed)
Pt not a candidate for thrombolytics. Low suspicion for LVO per Tele stroke MD

## 2023-03-31 NOTE — ED Notes (Signed)
CODE STROKE CALLED SPOKE WITH SALLIE

## 2023-03-31 NOTE — Progress Notes (Signed)
2143 Stroke cart activated and elert sent to [facility]. EDP Derrill Kay, MD) at bedside assessing pt at this time. Pt completed CT prior to cart activation. Pt c/o L facial droop and blurry vision to L eye with LKW 1700. 2149 [facility] paged 2154 [facility] (Then, MD) connected via stroke cart and assessing pt at this time.

## 2023-04-01 ENCOUNTER — Observation Stay: Payer: Medicaid Other

## 2023-04-01 ENCOUNTER — Observation Stay (HOSPITAL_BASED_OUTPATIENT_CLINIC_OR_DEPARTMENT_OTHER)
Admit: 2023-04-01 | Discharge: 2023-04-01 | Disposition: A | Discharge status: Home / Self Care | Payer: Medicaid Other | Attending: Internal Medicine | Admitting: Internal Medicine

## 2023-04-01 DIAGNOSIS — H538 Other visual disturbances: Secondary | ICD-10-CM | POA: Diagnosis not present

## 2023-04-01 DIAGNOSIS — G459 Transient cerebral ischemic attack, unspecified: Secondary | ICD-10-CM | POA: Diagnosis not present

## 2023-04-01 DIAGNOSIS — K219 Gastro-esophageal reflux disease without esophagitis: Secondary | ICD-10-CM | POA: Diagnosis present

## 2023-04-01 DIAGNOSIS — R519 Headache, unspecified: Secondary | ICD-10-CM | POA: Diagnosis not present

## 2023-04-01 LAB — ECHOCARDIOGRAM COMPLETE
AR max vel: 2.45 cm2
AV Area VTI: 2.81 cm2
AV Area mean vel: 2.56 cm2
AV Mean grad: 7 mmHg
AV Peak grad: 14.9 mmHg
Ao pk vel: 1.93 m/s
Area-P 1/2: 2.72 cm2
Height: 57 in
MV VTI: 3.86 cm2
S' Lateral: 2.7 cm
Weight: 3360 oz

## 2023-04-01 LAB — HIV ANTIBODY (ROUTINE TESTING W REFLEX): HIV Screen 4th Generation wRfx: NONREACTIVE

## 2023-04-01 LAB — LIPID PANEL
Cholesterol: 150 mg/dL (ref 0–200)
HDL: 42 mg/dL (ref >40)
LDL Cholesterol: 89 mg/dL (ref 0–99)
Total CHOL/HDL Ratio: 3.6 RATIO
Triglycerides: 95 mg/dL (ref <150)
VLDL: 19 mg/dL (ref 0–40)

## 2023-04-01 LAB — TSH: TSH: 25.207 u[IU]/mL — ABNORMAL HIGH (ref 0.350–4.500)

## 2023-04-01 LAB — HEMOGLOBIN A1C
Hgb A1c MFr Bld: 4.6 % — ABNORMAL LOW (ref 4.8–5.6)
Mean Plasma Glucose: 85.32 mg/dL

## 2023-04-01 MED ORDER — ATORVASTATIN CALCIUM 20 MG PO TABS: 20.0000 mg | ORAL_TABLET | Freq: Every day | ORAL | 2 refills | Status: DC

## 2023-04-01 MED ORDER — SENNOSIDES-DOCUSATE SODIUM 8.6-50 MG PO TABS: 1.0000 | ORAL_TABLET | Freq: Every evening | ORAL | Status: DC | PRN

## 2023-04-01 MED ORDER — LEVOTHYROXINE SODIUM 150 MCG PO TABS: ORAL_TABLET | ORAL | 6 refills | Status: DC

## 2023-04-01 MED ORDER — TRAZODONE HCL 50 MG PO TABS: 25.0000 mg | ORAL_TABLET | Freq: Every evening | ORAL | Status: DC | PRN

## 2023-04-01 MED ORDER — ACETAMINOPHEN 325 MG RE SUPP: 650.0000 mg | RECTAL | Status: DC | PRN

## 2023-04-01 MED ORDER — ACETAMINOPHEN 160 MG/5ML PO SOLN: 650.0000 mg | ORAL | Status: DC | PRN

## 2023-04-01 MED ORDER — ASPIRIN 325 MG PO TABS
325.0000 mg | ORAL_TABLET | Freq: Every day | ORAL | Status: DC
  Administered 2023-04-01: 325 mg via ORAL
  Filled 2023-04-01: qty 1

## 2023-04-01 MED ORDER — ASPIRIN 81 MG PO TBEC: 81.0000 mg | DELAYED_RELEASE_TABLET | Freq: Every day | ORAL | 0 refills | Status: DC

## 2023-04-01 MED ORDER — IOHEXOL 350 MG/ML SOLN
75.0000 mL | Freq: Once | INTRAVENOUS | Status: AC | PRN
  Administered 2023-04-01: 75 mL via INTRAVENOUS

## 2023-04-01 MED ORDER — STROKE: EARLY STAGES OF RECOVERY BOOK: Freq: Once | Status: DC

## 2023-04-01 MED ORDER — ACETAMINOPHEN 325 MG PO TABS
650.0000 mg | ORAL_TABLET | ORAL | Status: DC | PRN
  Administered 2023-04-01: 650 mg via ORAL
  Filled 2023-04-01: qty 2

## 2023-04-01 MED ORDER — ASPIRIN 300 MG RE SUPP: 300.0000 mg | Freq: Every day | RECTAL | Status: DC

## 2023-04-01 MED ORDER — SODIUM CHLORIDE 0.9 % IV SOLN: INTRAVENOUS | Status: DC

## 2023-04-01 MED ORDER — ONDANSETRON HCL 4 MG/2ML IJ SOLN: 4.0000 mg | INTRAMUSCULAR | Status: DC | PRN

## 2023-04-01 MED ORDER — ATORVASTATIN CALCIUM 20 MG PO TABS
20.0000 mg | ORAL_TABLET | Freq: Every day | ORAL | Status: DC
  Administered 2023-04-01: 20 mg via ORAL
  Filled 2023-04-01: qty 1

## 2023-04-01 NOTE — Assessment & Plan Note (Signed)
-   We will continue antihypertensives with permissive hypertensive parameters.

## 2023-04-01 NOTE — Progress Notes (Signed)
SLP Cancellation Note  Patient Details Name: REMIE MATHISON MRN: [id number] DOB: [birth year]   Cancelled treatment:       Reason Eval/Treat Not Completed: SLP screened, no needs identified, will sign off. Consult received and appreciated. Chart reviewed. Met w/ pt in room. Pt appropriately engaged in conversation w/ ST regarding current medical status, family members, and difficulty sleeping t/o night. Pt exhibited good insight regarding IV fluids and visits from hospital staff. Pt expressed wants/needs independently. Pt denied any difficulty w/ speech, expressive/receptive language, and swallowing. Pt encouraged to reach out to PCP if she noticed any acute changes after d/c upon return to [facility].  No acute ST needs identified. ST will s/o. MD to reconsult if any new needs arise during admission.  Dennie Fetters Graduate Clinician [facility], Speech Pathology   Dennie Fetters 04/01/2023, 10:04 AM

## 2023-04-01 NOTE — Discharge Summary (Addendum)
Physician Discharge Summary   Patient: Brandi Bright MRN: [id number] DOB: Apr 26, 1971  Admit date:     03/31/2023  Discharge date: 04/01/23  Discharge Physician: Aasiyah Auerbach   PCP: Corky Downs, MD   Recommendations at discharge:   Take medications as recommended  Discharge Diagnoses: Principal Problem:   TIA (transient ischemic attack) Active Problems:   Coronary artery disease   Essential hypertension   Hypothyroidism   Obesity, Class III, BMI 40-49.9 (morbid obesity)   GERD without esophagitis  Resolved Problems:   * No resolved hospital problems. *  Hospital Course:  Brandi Bright is a 52 y.o. Caucasian female with medical history significant for hypothyroidism, coronary artery disease s/p MI, liver cirrhosis, gout, osteoarthritis and chronic knee pain and hypothyroidism, who presented to the emergency room with acute onset of left facial droop and visual changes in the left eye that started around 5-5:30 PM with associated mild headache and dizziness without tingling or other paresthesias.  The patient denies any dysphagia or dysarthria.  She admits to chills without fever.  No chest pain but has experiencing palpitations.  No fever or chills.  No dysuria, oliguria or hematuria or flank pain.  No loss of bowel or bladder control.  No tinnitus or vertigo.   ED Course: When she came to the ER, BP was 155/82 with otherwise normal vital signs.  Labs revealed albumin of 3.4 and AST 47 with calcium of 8.8 with otherwise unremarkable CMP.  CBC showed leukopenia of 2.5 above previous levels and thrombocytopenia of 39 compared to 41 last month.    EKG as reviewed by me : EKG showed normal sinus rhythm with a rate of 70 with right bundle branch block and anterolateral Q waves. Imaging: CT head code stroke revealed no acute intracranial normalities.   The patient was seen by teleneurology and was not deemed to be candidate for thrombolytic therapy.  She will be admitted to a medical  telemetry observation bed for further evaluation and management.    Assessment and Plan:   TIA (transient ischemic attack) Patient presented for evaluation of transient right facial droop which has resolved. Neurology was consulted and they recommended observation to rule out an acute stroke. Initial CT scan of the head was negative for an acute bleed CT angiogram was negative for LVO Patient had a normal MRI of the brain 2D echocardiogram showed normal LVEF with no evidence of intra-atrial shunt Patient was seen and evaluated by PT/OT/ST and has no needs She will be discharged on aspirin and statins     GERD without esophagitis - Continue PPI therapy.   Hypothyroidism - Continue Synthroid.   Essential hypertension - Blood pressure is stable   Coronary artery disease - Continue sublingual nitroglycerin      Consultants: Neurology  Procedures performed: CT angiogram of the head and neck, MRI of the brain, 2D echocardiogram Disposition: Home Diet recommendation:  Discharge Diet Orders (From admission, onward)     Start     Ordered   04/01/23 0000  Diet - low sodium heart healthy        04/01/23 1527           Cardiac diet DISCHARGE MEDICATION: Allergies as of 04/01/2023       Reactions   Vicodin [hydrocodone-acetaminophen] Rash   Amoxicillin Rash, Other (See Comments)   Has patient had a PCN reaction causing immediate rash, facial/tongue/throat swelling, SOB or lightheadedness with hypotension: Yes Has patient had a PCN reaction causing severe rash  involving mucus membranes or skin necrosis: No Has patient had a PCN reaction that required hospitalization: No Has patient had a PCN reaction occurring within the last 10 years: No If all of the above answers are "NO", then may proceed with Cephalosporin use.   Gabapentin Rash        Medication List     STOP taking these medications    carvedilol 25 MG tablet Commonly known as: COREG       TAKE these  medications    aspirin EC 81 MG tablet Take 1 tablet (81 mg total) by mouth daily. Swallow whole.   atorvastatin 20 MG tablet Commonly known as: LIPITOR Take 1 tablet (20 mg total) by mouth daily. Start taking on: April 02, 2023   levothyroxine 150 MCG tablet Commonly known as: SYNTHROID Take 1 tablet p.o. daily What changed: See the new instructions.   nitroGLYCERIN 0.4 MG/SPRAY spray Commonly known as: NITROLINGUAL Place 1 spray under the tongue every 5 (five) minutes x 3 doses as needed for chest pain.   omeprazole 20 MG capsule Commonly known as: PRILOSEC Take 1 capsule (20 mg total) by mouth daily.   Oxycodone HCl 10 MG Tabs Take 10 mg by mouth 4 (four) times daily as needed.   oxyCODONE 15 mg 12 hr tablet Commonly known as: OXYCONTIN 15 mg every 12 (twelve) hours.   ZTlido 1.8 % Ptch Generic drug: Lidocaine Apply 1 patch topically at bedtime.        Discharge Exam: Filed Weights   03/31/23 2116 03/31/23 2122  Weight: 95.3 kg 95.3 kg   GENERAL:  52 y.o.-year-old Caucasian female patient lying in the bed with no acute distress.  EYES: Pupils equal, round, reactive to light and accommodation. No scleral icterus. Extraocular muscles intact.  HEENT: Head atraumatic, normocephalic. Oropharynx and nasopharynx clear.  NECK:  Supple, no jugular venous distention. No thyroid enlargement, no tenderness.  LUNGS: Normal breath sounds bilaterally, no wheezing, rales,rhonchi or crepitation. No use of accessory muscles of respiration.  CARDIOVASCULAR: Regular rate and rhythm, S1, S2 normal.  2/6 systolic ejection murmur at the left lower sternal border with no rubs, or gallops.  ABDOMEN: Soft, nondistended, nontender. Bowel sounds present. No organomegaly or mass.  EXTREMITIES: No pedal edema, cyanosis, or clubbing.  NEUROLOGIC: Cranial nerves II through XII are intact. Muscle strength 5/5 in all extremities. Sensation intact. Gait not checked.  PSYCHIATRIC: The patient is  alert and oriented x 3.  Normal affect and good eye contact. SKIN: No obvious rash, lesion, or ulcer.   Condition at discharge: stable  The results of significant diagnostics from this hospitalization (including imaging, microbiology, ancillary and laboratory) are listed below for reference.   Imaging Studies: ECHOCARDIOGRAM COMPLETE  Result Date: 04/01/2023    ECHOCARDIOGRAM REPORT   Patient Name:   IA LEEB Kunst Date of Exam: 04/01/2023 Medical Rec #:  [id number]     Height:       57.0 in Accession #:    [id number]    Weight:       210.0 lb Date of Birth:  Oct 29, 1971     BSA:          1.836 m Patient Age:    52 years      BP:           138/82 mmHg Patient Gender: F             HR:           75 bpm. Exam  Location:  [facility] Procedure: 2D Echo, Cardiac Doppler and Color Doppler Indications:     TIA  History:         Patient has prior history of Echocardiogram examinations, most                  recent 06/25/2019. Angina and CAD, TIA; Risk                  Factors:Hypertension and Dyslipidemia.  Sonographer:     Mikki Harbor Referring Phys:  [id number] Guido Comp Diagnosing Phys: Julien Nordmann MD IMPRESSIONS  1. Left ventricular ejection fraction, by estimation, is 60 to 65%. The left ventricle has normal function. The left ventricle has no regional wall motion abnormalities. There is mild left ventricular hypertrophy. Left ventricular diastolic parameters are consistent with Grade I diastolic dysfunction (impaired relaxation).  2. Right ventricular systolic function is normal. The right ventricular size is normal. There is normal pulmonary artery systolic pressure. The estimated right ventricular systolic pressure is 30.0 mmHg.  3. The mitral valve is normal in structure. Mild mitral valve regurgitation. No evidence of mitral stenosis.  4. The aortic valve is tricuspid. Aortic valve regurgitation is not visualized. No aortic stenosis is present.  5. The inferior vena cava is normal in size with greater than  50% respiratory variability, suggesting right atrial pressure of 3 mmHg. FINDINGS  Left Ventricle: Left ventricular ejection fraction, by estimation, is 60 to 65%. The left ventricle has normal function. The left ventricle has no regional wall motion abnormalities. The left ventricular internal cavity size was normal in size. There is  mild left ventricular hypertrophy. Left ventricular diastolic parameters are consistent with Grade I diastolic dysfunction (impaired relaxation). Right Ventricle: The right ventricular size is normal. No increase in right ventricular wall thickness. Right ventricular systolic function is normal. There is normal pulmonary artery systolic pressure. The tricuspid regurgitant velocity is 2.50 m/s, and  with an assumed right atrial pressure of 5 mmHg, the estimated right ventricular systolic pressure is 30.0 mmHg. Left Atrium: Left atrial size was normal in size. Right Atrium: Right atrial size was normal in size. Pericardium: There is no evidence of pericardial effusion. Mitral Valve: The mitral valve is normal in structure. Mild mitral valve regurgitation. No evidence of mitral valve stenosis. MV peak gradient, 3.3 mmHg. The mean mitral valve gradient is 1.0 mmHg. Tricuspid Valve: The tricuspid valve is normal in structure. Tricuspid valve regurgitation is mild . No evidence of tricuspid stenosis. Aortic Valve: The aortic valve is tricuspid. Aortic valve regurgitation is not visualized. No aortic stenosis is present. Aortic valve mean gradient measures 7.0 mmHg. Aortic valve peak gradient measures 14.9 mmHg. Aortic valve area, by VTI measures 2.81  cm. Pulmonic Valve: The pulmonic valve was normal in structure. Pulmonic valve regurgitation is not visualized. No evidence of pulmonic stenosis. Aorta: The aortic root is normal in size and structure. Venous: The inferior vena cava is normal in size with greater than 50% respiratory variability, suggesting right atrial pressure of 3 mmHg.  IAS/Shunts: No atrial level shunt detected by color flow Doppler.  LEFT VENTRICLE PLAX 2D LVIDd:         4.40 cm   Diastology LVIDs:         2.70 cm   LV e' medial:    6.20 cm/s LV PW:         1.20 cm   LV E/e' medial:  12.4 LV IVS:        1.40  cm   LV e' lateral:   12.50 cm/s LVOT diam:     1.90 cm   LV E/e' lateral: 6.1 LV SV:         107 LV SV Index:   58 LVOT Area:     2.84 cm  RIGHT VENTRICLE RV Basal diam:  3.55 cm RV Mid diam:    3.50 cm RV S prime:     15.50 cm/s TAPSE (M-mode): 2.8 cm LEFT ATRIUM             Index        RIGHT ATRIUM           Index LA diam:        3.40 cm 1.85 cm/m   RA Area:     15.50 cm LA Vol (A2C):   66.1 ml 36.00 ml/m  RA Volume:   34.50 ml  18.79 ml/m LA Vol (A4C):   46.1 ml 25.10 ml/m LA Biplane Vol: 60.8 ml 33.11 ml/m  AORTIC VALVE                     PULMONIC VALVE AV Area (Vmax):    2.45 cm      PV Vmax:       2.11 m/s AV Area (Vmean):   2.56 cm      PV Peak grad:  17.8 mmHg AV Area (VTI):     2.81 cm AV Vmax:           193.00 cm/s AV Vmean:          115.000 cm/s AV VTI:            0.379 m AV Peak Grad:      14.9 mmHg AV Mean Grad:      7.0 mmHg LVOT Vmax:         167.00 cm/s LVOT Vmean:        104.000 cm/s LVOT VTI:          0.376 m LVOT/AV VTI ratio: 0.99  AORTA Ao Root diam: 3.50 cm MITRAL VALVE               TRICUSPID VALVE MV Area (PHT): 2.72 cm    TR Peak grad:   25.0 mmHg MV Area VTI:   3.86 cm    TR Vmax:        250.00 cm/s MV Peak grad:  3.3 mmHg MV Mean grad:  1.0 mmHg    SHUNTS MV Vmax:       0.91 m/s    Systemic VTI:  0.38 m MV Vmean:      52.6 cm/s   Systemic Diam: 1.90 cm MV Decel Time: 279 msec MV E velocity: 76.80 cm/s MV A velocity: 65.40 cm/s MV E/A ratio:  1.17 Julien Nordmann MD Electronically signed by Julien Nordmann MD Signature Date/Time: 04/01/2023/2:15:45 PM    Final    MR BRAIN WO CONTRAST  Result Date: 04/01/2023 CLINICAL DATA:  Right-sided headache and blurry vision EXAM: MRI HEAD WITHOUT CONTRAST TECHNIQUE: Multiplanar, multiecho pulse  sequences of the brain and surrounding structures were obtained without intravenous contrast. COMPARISON:  None Available. FINDINGS: Brain: No acute infarct, mass effect or extra-axial collection. No acute or chronic hemorrhage. Normal white matter signal, parenchymal volume and CSF spaces. The midline structures are normal. Vascular: Major flow voids are preserved. Skull and upper cervical spine: Normal calvarium and skull base. Visualized upper cervical spine and soft tissues are normal. Sinuses/Orbits:No paranasal sinus fluid levels  or advanced mucosal thickening. No mastoid or middle ear effusion. Normal orbits. IMPRESSION: Normal brain MRI. Electronically Signed   By: Deatra Robinson M.D.   On: 04/01/2023 03:18   CT ANGIO HEAD NECK W WO CM  Result Date: 04/01/2023 CLINICAL DATA:  Headache and blurry vision EXAM: CT ANGIOGRAPHY HEAD AND NECK WITH AND WITHOUT CONTRAST TECHNIQUE: Multidetector CT imaging of the head and neck was performed using the standard protocol during bolus administration of intravenous contrast. Multiplanar CT image reconstructions and MIPs were obtained to evaluate the vascular anatomy. Carotid stenosis measurements (when applicable) are obtained utilizing NASCET criteria, using the distal internal carotid diameter as the denominator. RADIATION DOSE REDUCTION: This exam was performed according to the departmental dose-optimization program which includes automated exposure control, adjustment of the mA and/or kV according to patient size and/or use of iterative reconstruction technique. CONTRAST:  75mL OMNIPAQUE IOHEXOL 350 MG/ML SOLN COMPARISON:  None Available. FINDINGS: CTA NECK FINDINGS SKELETON: There is no bony spinal canal stenosis. No lytic or blastic lesion. OTHER NECK: Normal pharynx, larynx and major salivary glands. No cervical lymphadenopathy. Unremarkable thyroid gland. UPPER CHEST: No pneumothorax or pleural effusion. No nodules or masses. AORTIC ARCH: There is no calcific  atherosclerosis of the aortic arch. There is no aneurysm, dissection or hemodynamically significant stenosis of the visualized portion of the aorta. Normal variant aortic arch branching pattern with the brachiocephalic and left common carotid arteries sharing a common origin. The visualized proximal subclavian arteries are widely patent. RIGHT CAROTID SYSTEM: Normal without aneurysm, dissection or stenosis. LEFT CAROTID SYSTEM: Normal without aneurysm, dissection or stenosis. VERTEBRAL ARTERIES: Left dominant configuration. Both origins are clearly patent. There is no dissection, occlusion or flow-limiting stenosis to the skull base (V1-V3 segments). CTA HEAD FINDINGS POSTERIOR CIRCULATION: --Vertebral arteries: Normal V4 segments. --Inferior cerebellar arteries: Normal. --Basilar artery: Normal. --Superior cerebellar arteries: Normal. --Posterior cerebral arteries (PCA): Normal. ANTERIOR CIRCULATION: --Intracranial internal carotid arteries: Normal. --Anterior cerebral arteries (ACA): Normal. Both A1 segments are present. Patent anterior communicating artery (a-comm). --Middle cerebral arteries (MCA): Normal. VENOUS SINUSES: As permitted by contrast timing, patent. ANATOMIC VARIANTS: None Review of the MIP images confirms the above findings. IMPRESSION: Normal CTA of the head and neck. Electronically Signed   By: Deatra Robinson M.D.   On: 04/01/2023 03:07   CT HEAD CODE STROKE WO CONTRAST  Result Date: 03/31/2023 CLINICAL DATA:  Code stroke.  Blurry vision and facial droop EXAM: CT HEAD WITHOUT CONTRAST TECHNIQUE: Contiguous axial images were obtained from the base of the skull through the vertex without intravenous contrast. RADIATION DOSE REDUCTION: This exam was performed according to the departmental dose-optimization program which includes automated exposure control, adjustment of the mA and/or kV according to patient size and/or use of iterative reconstruction technique. COMPARISON:  None Available.  FINDINGS: Brain: There is no mass, hemorrhage or extra-axial collection. The size and configuration of the ventricles and extra-axial CSF spaces are normal. The brain parenchyma is normal, without evidence of acute or chronic infarction. Vascular: No abnormal hyperdensity of the major intracranial arteries or dural venous sinuses. No intracranial atherosclerosis. Skull: The visualized skull base, calvarium and extracranial soft tissues are normal. Sinuses/Orbits: No fluid levels or advanced mucosal thickening of the visualized paranasal sinuses. No mastoid or middle ear effusion. The orbits are normal. ASPECTS [facility] Stroke Program Early CT Score) - Ganglionic level infarction (caudate, lentiform nuclei, internal capsule, insula, M1-M3 cortex): 7 - Supraganglionic infarction (M4-M6 cortex): 3 Total score (0-10 with 10 being normal): 10 IMPRESSION: 1.  No acute intracranial abnormality. 2. ASPECTS is 10. These results were called by telephone at the time of interpretation on 03/31/2023 at 9:46 pm to provider [facility] , who verbally acknowledged these results. Electronically Signed   By: Deatra Robinson M.D.   On: 03/31/2023 21:46    Microbiology: Results for orders placed or performed during the hospital encounter of 01/10/20  SARS CORONAVIRUS 2 (TAT 6-24 HRS) Nasopharyngeal Nasopharyngeal Swab     Status: None   Collection Time: 01/10/20  3:36 AM   Specimen: Nasopharyngeal Swab  Result Value Ref Range Status   SARS Coronavirus 2 NEGATIVE NEGATIVE Final    Comment: (NOTE) SARS-CoV-2 target nucleic acids are NOT DETECTED. The SARS-CoV-2 RNA is generally detectable in upper and lower respiratory specimens during the acute phase of infection. Negative results do not preclude SARS-CoV-2 infection, do not rule out co-infections with other pathogens, and should not be used as the sole basis for treatment or other patient management decisions. Negative results must be combined with clinical  observations, patient history, and epidemiological information. The expected result is Negative. Fact Sheet for Patients: HairSlick.no Fact Sheet for Healthcare Providers: quierodirigir.com This test is not yet approved or cleared by the [place] FDA and  has been authorized for detection and/or diagnosis of SARS-CoV-2 by FDA under an Emergency Use Authorization (EUA). This EUA will remain  in effect (meaning this test can be used) for the duration of the COVID-19 declaration under Section 56 4(b)(1) of the Act, 21 U.S.C. section 360bbb-3(b)(1), unless the authorization is terminated or revoked sooner. Performed at [facility], [address] [place], [place] [address]     Labs: CBC: Recent Labs  Lab 03/31/23 2125  WBC 2.5*  NEUTROABS 1.3*  HGB 12.7  HCT 39.8  MCV 87.5  PLT 39*   Basic Metabolic Panel: Recent Labs  Lab 03/31/23 2125  NA 141  K 3.6  CL 112*  CO2 25  GLUCOSE 108*  BUN 9  CREATININE 0.58  CALCIUM 8.8*   Liver Function Tests: Recent Labs  Lab 03/31/23 2125  AST 47*  ALT 24  ALKPHOS 113  BILITOT 1.2  PROT 6.5  ALBUMIN 3.4*   CBG: Recent Labs  Lab 03/31/23 2121  GLUCAP 102*    Discharge time spent: greater than 30 minutes.  Signed: Lucile Shutters, MD [facility] 04/01/2023

## 2023-04-01 NOTE — Assessment & Plan Note (Signed)
-   We will continue as needed sublingual nitroglycerin as well as beta-blocker therapy with Coreg.

## 2023-04-01 NOTE — Assessment & Plan Note (Signed)
-   The patient will be admitted to an observation medically monitored bed.   - We will follow neuro checks q.4 hours for 24 hours.   - The patient will be placed on aspirin.   - Will obtain a brain MRI without contrast as well as bilateral carotid Doppler and 2D echo with bubble study .   - A neurology consultation  as well as physical/occupation/speech therapy consults will be obtained in a.m.Marland Kitchen - I notified Dr. Amada Jupiter about the patient. - The patient will be placed on statin therapy and fasting lipids will be checked.

## 2023-04-01 NOTE — Assessment & Plan Note (Signed)
-   We will continue PPI therapy 

## 2023-04-01 NOTE — Assessment & Plan Note (Signed)
-   We will continue Synthroid. 

## 2023-04-01 NOTE — H&P (Signed)
[place]   PATIENT NAME: Brandi Bright    MR#:  [id number]  DATE OF BIRTH:  Mar 17, 1971  DATE OF ADMISSION:  03/31/2023  PRIMARY CARE PHYSICIAN: Corky Downs, MD   Patient is coming from: Home  REQUESTING/REFERRING PHYSICIAN: Phineas Semen, MD  CHIEF COMPLAINT:   Chief Complaint  Patient presents with   Headache    HISTORY OF PRESENT ILLNESS:  Brandi Bright is a 52 y.o. Caucasian female with medical history significant for hypothyroidism, coronary artery disease s/p MI, liver cirrhosis, gout, osteoarthritis and chronic knee pain and hypothyroidism, who presented to the emergency room with acute onset of left facial droop and visual changes in the left eye that started around 5-5:30 PM with associated mild headache and dizziness without tingling or other paresthesias.  The patient denies any dysphagia or dysarthria.  She admits to chills without fever.  No chest pain but has experiencing palpitations.  No fever or chills.  No dysuria, oliguria or hematuria or flank pain.  No loss of bowel or bladder control.  No tinnitus or vertigo.  ED Course: When she came to the ER, BP was 155/82 with otherwise normal vital signs.  Labs revealed albumin of 3.4 and AST 47 with calcium of 8.8 with otherwise unremarkable CMP.  CBC showed leukopenia of 2.5 above previous levels and thrombocytopenia of 39 compared to 41 last month.    EKG as reviewed by me : EKG showed normal sinus rhythm with a rate of 70 with right bundle branch block and anterolateral Q waves. Imaging: CT head code stroke revealed no acute intracranial normalities.  The patient was seen by teleneurology and was not deemed to be candidate for thrombolytic therapy.  She will be admitted to a medical telemetry observation bed for further evaluation and management. PAST MEDICAL HISTORY:   Past Medical History:  Diagnosis Date   Arthritis    Back pain    Chronic knee pain    Cirrhosis    Gout    Hypothyroidism    IDA  (iron deficiency anemia) 01/16/2020   MI, old    Thyroid disease     PAST SURGICAL HISTORY:   Past Surgical History:  Procedure Laterality Date   CARDIAC CATHETERIZATION     CESAREAN SECTION     COLONOSCOPY WITH PROPOFOL N/A 03/16/2018   Procedure: COLONOSCOPY WITH PROPOFOL;  Surgeon: Pasty Spillers, MD;  Location: [facility] ENDOSCOPY;  Service: Endoscopy;  Laterality: N/A;   COLONOSCOPY WITH PROPOFOL N/A 07/09/2021   Procedure: COLONOSCOPY WITH PROPOFOL;  Surgeon: Pasty Spillers, MD;  Location: [facility] ENDOSCOPY;  Service: Endoscopy;  Laterality: N/A;   ESOPHAGOGASTRODUODENOSCOPY N/A 10/11/2019   Procedure: ESOPHAGOGASTRODUODENOSCOPY (EGD);  Surgeon: Toney Reil, MD;  Location: [facility] ENDOSCOPY;  Service: Gastroenterology;  Laterality: N/A;   ESOPHAGOGASTRODUODENOSCOPY (EGD) WITH PROPOFOL N/A 10/27/2018   Procedure: ESOPHAGOGASTRODUODENOSCOPY (EGD) WITH PROPOFOL;  Surgeon: Pasty Spillers, MD;  Location: [facility] ENDOSCOPY;  Service: Endoscopy;  Laterality: N/A;   ESOPHAGOGASTRODUODENOSCOPY (EGD) WITH PROPOFOL N/A 06/25/2019   Procedure: ESOPHAGOGASTRODUODENOSCOPY (EGD) WITH PROPOFOL;  Surgeon: Wyline Mood, MD;  Location: [facility] END[facility]COPY;  Service: Gastroenterology;  Laterality: N/A;   ESOPHAGOGASTRODUODENOSCOPY (EGD) WITH PROPOFOL N/A 09/12/2019   Procedure: ESOPHAGOGASTRODUODENOSCOPY (EGD) WITH PROPOFOL;  Surgeon: Midge Minium, MD;  Location: [facility] ENDOSCOPY;  Service: Endoscopy;  Laterality: N/A;   ESOPHAGOGASTRODUODENOSCOPY (EGD) WITH PROPOFOL N/A 11/20/2019   Procedure: ESOPHAGOGASTRODUODENOSCOPY (EGD) WITH PROPOFOL;  Surgeon: Pasty Spillers, MD;  Location: [facility] ENDOSCOPY;  Service: Endoscopy;  Laterality: N/A;  ESOPHAGOGASTRODUODENOSCOPY (EGD) WITH PROPOFOL N/A 11/23/2019   Procedure: ESOPHAGOGASTRODUODENOSCOPY (EGD) WITH PROPOFOL;  Surgeon: Pasty Spillers, MD;  Location: [facility] ENDOSCOPY;  Service: Endoscopy;  Laterality: N/A;   ESOPHAGOGASTRODUODENOSCOPY (EGD) WITH  PROPOFOL N/A 01/03/2020   Procedure: ESOPHAGOGASTRODUODENOSCOPY (EGD) WITH PROPOFOL;  Surgeon: Toney Reil, MD;  Location: [facility] ENDOSCOPY;  Service: Endoscopy;  Laterality: N/A;   ESOPHAGOGASTRODUODENOSCOPY (EGD) WITH PROPOFOL N/A 07/09/2021   Procedure: ESOPHAGOGASTRODUODENOSCOPY (EGD) WITH PROPOFOL;  Surgeon: Pasty Spillers, MD;  Location: [facility] ENDOSCOPY;  Service: Endoscopy;  Laterality: N/A;   TUBAL LIGATION      SOCIAL HISTORY:   Social History   Tobacco Use   Smoking status: Never   Smokeless tobacco: Never  Substance Use Topics   Alcohol use: Yes    FAMILY HISTORY:   Family History  Problem Relation Age of Onset   Hypertension Other    CAD Father    Colon cancer Father    Lung cancer Father    Pancreatic cancer Brother     DRUG ALLERGIES:   Allergies  Allergen Reactions   Vicodin [Hydrocodone-Acetaminophen] Rash   Amoxicillin Rash and Other (See Comments)    Has patient had a PCN reaction causing immediate rash, facial/tongue/throat swelling, SOB or lightheadedness with hypotension: Yes Has patient had a PCN reaction causing severe rash involving mucus membranes or skin necrosis: No Has patient had a PCN reaction that required hospitalization: No Has patient had a PCN reaction occurring within the last 10 years: No If all of the above answers are "NO", then may proceed with Cephalosporin use.    Gabapentin Rash    REVIEW OF SYSTEMS:   ROS As per history of present illness. All pertinent systems were reviewed above. Constitutional, HEENT, cardiovascular, respiratory, GI, GU, musculoskeletal, neuro, psychiatric, endocrine, integumentary and hematologic systems were reviewed and are otherwise negative/unremarkable except for positive findings mentioned above in the HPI.   MEDICATIONS AT HOME:   Prior to Admission medications   Medication Sig Start Date End Date Taking? Authorizing Provider  levothyroxine (SYNTHROID) 150 MCG tablet TAKE 1 TABLET BY  MOUTH DAILY, 30 MINUTES BEFORE A MEAL 07/09/22  Yes Masoud, Renda Rolls, MD  nitroGLYCERIN (NITROLINGUAL) 0.4 MG/SPRAY spray Place 1 spray under the tongue every 5 (five) minutes x 3 doses as needed for chest pain. 04/24/20  Yes Masoud, Renda Rolls, MD  omeprazole (PRILOSEC) 20 MG capsule Take 1 capsule (20 mg total) by mouth daily. 09/30/22  Yes Tressia Danas, MD  oxyCODONE (OXYCONTIN) 15 mg 12 hr tablet 15 mg every 12 (twelve) hours. 06/09/22  Yes [provider]  Oxycodone HCl 10 MG TABS Take 10 mg by mouth 4 (four) times daily as needed. 10/14/20  Yes [provider]  ZTLIDO 1.8 % PTCH Apply 1 patch topically at bedtime. 07/10/22  Yes [provider]  carvedilol (COREG) 25 MG tablet Take 1 tablet (25 mg total) by mouth daily at 2 PM. Patient not taking: Reported on 04/01/2023 10/01/22   Corky Downs, MD      VITAL SIGNS:  Blood pressure 127/69, pulse 64, temperature 98.2 F (36.8 C), temperature source Oral, resp. rate (!) 22, height  (1.448 m), weight 95.3 kg, SpO2 98 %.  PHYSICAL EXAMINATION:  Physical Exam  GENERAL:  52 y.o.-year-old Caucasian female patient lying in the bed with no acute distress.  EYES: Pupils equal, round, reactive to light and accommodation. No scleral icterus. Extraocular muscles intact.  HEENT: Head atraumatic, normocephalic. Oropharynx and nasopharynx clear.  NECK:  Supple, no  jugular venous distention. No thyroid enlargement, no tenderness.  LUNGS: Normal breath sounds bilaterally, no wheezing, rales,rhonchi or crepitation. No use of accessory muscles of respiration.  CARDIOVASCULAR: Regular rate and rhythm, S1, S2 normal.  2/6 systolic ejection murmur at the left lower sternal border with no rubs, or gallops.  ABDOMEN: Soft, nondistended, nontender. Bowel sounds present. No organomegaly or mass.  EXTREMITIES: No pedal edema, cyanosis, or clubbing.  NEUROLOGIC: Cranial nerves II through XII are intact. Muscle strength 5/5 in all  extremities. Sensation intact. Gait not checked.  PSYCHIATRIC: The patient is alert and oriented x 3.  Normal affect and good eye contact. SKIN: No obvious rash, lesion, or ulcer.   LABORATORY PANEL:   CBC Recent Labs  Lab 03/31/23 2125  WBC 2.5*  HGB 12.7  HCT 39.8  PLT 39*   ------------------------------------------------------------------------------------------------------------------  Chemistries  Recent Labs  Lab 03/31/23 2125  NA 141  K 3.6  CL 112*  CO2 25  GLUCOSE 108*  BUN 9  CREATININE 0.58  CALCIUM 8.8*  AST 47*  ALT 24  ALKPHOS 113  BILITOT 1.2   ------------------------------------------------------------------------------------------------------------------  Cardiac Enzymes No results for input(s): "TROPONINI" in the last 168 hours. ------------------------------------------------------------------------------------------------------------------  RADIOLOGY:  MR BRAIN WO CONTRAST  Result Date: 04/01/2023 CLINICAL DATA:  Right-sided headache and blurry vision EXAM: MRI HEAD WITHOUT CONTRAST TECHNIQUE: Multiplanar, multiecho pulse sequences of the brain and surrounding structures were obtained without intravenous contrast. COMPARISON:  None Available. FINDINGS: Brain: No acute infarct, mass effect or extra-axial collection. No acute or chronic hemorrhage. Normal white matter signal, parenchymal volume and CSF spaces. The midline structures are normal. Vascular: Major flow voids are preserved. Skull and upper cervical spine: Normal calvarium and skull base. Visualized upper cervical spine and soft tissues are normal. Sinuses/Orbits:No paranasal sinus fluid levels or advanced mucosal thickening. No mastoid or middle ear effusion. Normal orbits. IMPRESSION: Normal brain MRI. Electronically Signed   By: Deatra Robinson M.D.   On: 04/01/2023 03:18   CT ANGIO HEAD NECK W WO CM  Result Date: 04/01/2023 CLINICAL DATA:  Headache and blurry vision EXAM: CT ANGIOGRAPHY  HEAD AND NECK WITH AND WITHOUT CONTRAST TECHNIQUE: Multidetector CT imaging of the head and neck was performed using the standard protocol during bolus administration of intravenous contrast. Multiplanar CT image reconstructions and MIPs were obtained to evaluate the vascular anatomy. Carotid stenosis measurements (when applicable) are obtained utilizing NASCET criteria, using the distal internal carotid diameter as the denominator. RADIATION DOSE REDUCTION: This exam was performed according to the departmental dose-optimization program which includes automated exposure control, adjustment of the mA and/or kV according to patient size and/or use of iterative reconstruction technique. CONTRAST:  75mL OMNIPAQUE IOHEXOL 350 MG/ML SOLN COMPARISON:  None Available. FINDINGS: CTA NECK FINDINGS SKELETON: There is no bony spinal canal stenosis. No lytic or blastic lesion. OTHER NECK: Normal pharynx, larynx and major salivary glands. No cervical lymphadenopathy. Unremarkable thyroid gland. UPPER CHEST: No pneumothorax or pleural effusion. No nodules or masses. AORTIC ARCH: There is no calcific atherosclerosis of the aortic arch. There is no aneurysm, dissection or hemodynamically significant stenosis of the visualized portion of the aorta. Normal variant aortic arch branching pattern with the brachiocephalic and left common carotid arteries sharing a common origin. The visualized proximal subclavian arteries are widely patent. RIGHT CAROTID SYSTEM: Normal without aneurysm, dissection or stenosis. LEFT CAROTID SYSTEM: Normal without aneurysm, dissection or stenosis. VERTEBRAL ARTERIES: Left dominant configuration. Both origins are clearly patent. There is no dissection, occlusion or flow-limiting  stenosis to the skull base (V1-V3 segments). CTA HEAD FINDINGS POSTERIOR CIRCULATION: --Vertebral arteries: Normal V4 segments. --Inferior cerebellar arteries: Normal. --Basilar artery: Normal. --Superior cerebellar arteries: Normal.  --Posterior cerebral arteries (PCA): Normal. ANTERIOR CIRCULATION: --Intracranial internal carotid arteries: Normal. --Anterior cerebral arteries (ACA): Normal. Both A1 segments are present. Patent anterior communicating artery (a-comm). --Middle cerebral arteries (MCA): Normal. VENOUS SINUSES: As permitted by contrast timing, patent. ANATOMIC VARIANTS: None Review of the MIP images confirms the above findings. IMPRESSION: Normal CTA of the head and neck. Electronically Signed   By: Deatra Robinson M.D.   On: 04/01/2023 03:07   CT HEAD CODE STROKE WO CONTRAST  Result Date: 03/31/2023 CLINICAL DATA:  Code stroke.  Blurry vision and facial droop EXAM: CT HEAD WITHOUT CONTRAST TECHNIQUE: Contiguous axial images were obtained from the base of the skull through the vertex without intravenous contrast. RADIATION DOSE REDUCTION: This exam was performed according to the departmental dose-optimization program which includes automated exposure control, adjustment of the mA and/or kV according to patient size and/or use of iterative reconstruction technique. COMPARISON:  None Available. FINDINGS: Brain: There is no mass, hemorrhage or extra-axial collection. The size and configuration of the ventricles and extra-axial CSF spaces are normal. The brain parenchyma is normal, without evidence of acute or chronic infarction. Vascular: No abnormal hyperdensity of the major intracranial arteries or dural venous sinuses. No intracranial atherosclerosis. Skull: The visualized skull base, calvarium and extracranial soft tissues are normal. Sinuses/Orbits: No fluid levels or advanced mucosal thickening of the visualized paranasal sinuses. No mastoid or middle ear effusion. The orbits are normal. ASPECTS [facility] Stroke Program Early CT Score) - Ganglionic level infarction (caudate, lentiform nuclei, internal capsule, insula, M1-M3 cortex): 7 - Supraganglionic infarction (M4-M6 cortex): 3 Total score (0-10 with 10 being normal): 10  IMPRESSION: 1. No acute intracranial abnormality. 2. ASPECTS is 10. These results were called by telephone at the time of interpretation on 03/31/2023 at 9:46 pm to provider [facility] , who verbally acknowledged these results. Electronically Signed   By: Deatra Robinson M.D.   On: 03/31/2023 21:46      IMPRESSION AND PLAN:  Assessment and Plan: * TIA (transient ischemic attack) - The patient will be admitted to an observation medically monitored bed.   - We will follow neuro checks q.4 hours for 24 hours.   - The patient will be placed on aspirin.   - Will obtain a brain MRI without contrast as well as bilateral carotid Doppler and 2D echo with bubble study .   - A neurology consultation  as well as physical/occupation/speech therapy consults will be obtained in a.m.Marland Kitchen - I notified Dr. Amada Jupiter about the patient. - The patient will be placed on statin therapy and fasting lipids will be checked.   GERD without esophagitis - We will continue PPI therapy.  Hypothyroidism - We will continue Synthroid.  Essential hypertension - We will continue antihypertensives with permissive hypertensive parameters.  Coronary artery disease - We will continue as needed sublingual nitroglycerin as well as beta-blocker therapy with Coreg.   DVT prophylaxis: Lovenox.  Advanced Care Planning:  Code Status: full code.  Family Communication:  The plan of care was discussed in details with the patient (and family). I answered all questions. The patient agreed to proceed with the above mentioned plan. Further management will depend upon hospital course. Disposition Plan: Back to previous home environment Consults called: Neurology All the records are reviewed and case discussed with ED provider.  Status is: Observation  I certify that at the time of admission, it is my clinical judgment that the patient will require hospital care extending less than 2 midnights.                            Dispo:  The patient is from: Home              Anticipated d/c is to: Home              Patient currently is not medically stable to d/c.              Difficult to place patient: No  Hannah Beat M.D on 04/01/2023 at 5:36 AM  [facility]   From 7 PM-7 AM, contact night-coverage [email or web address]  CC: Primary care physician; Corky Downs, MD

## 2023-04-01 NOTE — Progress Notes (Signed)
PT Cancellation Note  Patient Details Name: Brandi Bright MRN: [id number] DOB: 03/10/1971   Cancelled Treatment:    Reason Eval/Treat Not Completed: PT screened, no needs identified, will sign off. Per OT pt has returned to baseline no PT needs.   Olga Coaster PT, DPT 9:36 AM,04/01/23

## 2023-04-01 NOTE — Evaluation (Signed)
Occupational Therapy Evaluation Patient Details Name: Brandi Bright MRN: [id number] DOB: [birth year] Today's Date: 04/01/2023   History of Present Illness 52 y.o. Caucasian female with medical history significant for hypothyroidism, coronary artery disease s/p MI, liver cirrhosis, gout, osteoarthritis and chronic knee pain and hypothyroidism, who presented to the emergency room with acute onset of left facial droop and visual changes in the left eye that started around 5-5:30 PM with associated mild headache and dizziness without tingling or other paresthesias.  The patient denies any dysphagia or dysarthria.  She admits to chills without fever.   Clinical Impression   Upon entering the room, pt supine in bed and reports living at home with daughter. Daughter goes to college classes in the afternoon. Pt reports being Ind at baseline with all self care , IADLs, and functional mobility. Pt performs mobility on hospital stretcher without assistance and dons B socks. Pt stands and ambulates in hallway without issue. Pt bending down to pick up items from floor without LOB. Pt reports vision changes has resolved and she feels confident about returning home. Pt has returned to baseline with no need for skilled OT intervention at this time. OT to complete order.       Recommendations for follow up therapy are one component of a multi-disciplinary discharge planning process, led by the attending physician.  Recommendations may be updated based on patient status, additional functional criteria and insurance authorization.   Assistance Recommended at Discharge None     Functional Status Assessment  Patient has not had a recent decline in their functional status        Precautions / Restrictions Precautions Precautions: Fall      Mobility Bed Mobility Overal bed mobility: Independent                  Transfers Overall transfer level: Independent                        Balance  Overall balance assessment: Independent                                         ADL either performed or assessed with clinical judgement   ADL Overall ADL's : Independent                                             Vision Patient Visual Report: No change from baseline              Pertinent Vitals/Pain Pain Assessment Pain Assessment: No/denies pain        Extremity/Trunk Assessment Upper Extremity Assessment Upper Extremity Assessment: Overall WFL for tasks assessed   Lower Extremity Assessment Lower Extremity Assessment: Overall WFL for tasks assessed   Cervical / Trunk Assessment Cervical / Trunk Assessment: Normal   Communication Communication Communication: No difficulties   Cognition Arousal/Alertness: Awake/alert Behavior During Therapy: WFL for tasks assessed/performed Overall Cognitive Status: Within Functional Limits for tasks assessed                                                  Home Living Family/patient expects  to be discharged to:: Private residence Living Arrangements: Other relatives (daughter) Available Help at Discharge: Family;Available PRN/intermittently Type of Home: House Home Access: Stairs to enter [employer] of Steps: 2 Entrance Stairs-Rails: None Home Layout: One level     Bathroom Shower/Tub: Chief Strategy Officer: Standard     Home Equipment: None          Prior Functioning/Environment Prior Level of Function : Independent/Modified Independent                                 OT Goals(Current goals can be found in the care plan section) Acute Rehab OT Goals Patient Stated Goal: home OT Goal Formulation: With patient Time For Goal Achievement: 04/01/23 Potential to Achieve Goals: Good  OT Frequency:         AM-PAC OT "6 Clicks" Daily Activity     Outcome Measure Help from another person eating meals?: None Help from  another person taking care of personal grooming?: None Help from another person toileting, which includes using toliet, bedpan, or urinal?: None Help from another person bathing (including washing, rinsing, drying)?: None Help from another person to put on and taking off regular upper body clothing?: None Help from another person to put on and taking off regular lower body clothing?: None 6 Click Score: 24   End of Session Nurse Communication: Mobility status  Activity Tolerance: Patient tolerated treatment well Patient left: in bed                   Time: 0920-0930 OT Time Calculation (min): 10 min Charges:  OT General Charges $OT Visit: 1 Visit OT Evaluation $OT Eval Low Complexity: 1 Low  Jackquline Denmark, MS, OTR/L , CBIS ascom [phone]  04/01/23, 12:30 PM

## 2023-04-01 NOTE — Progress Notes (Signed)
*  PRELIMINARY RESULTS* Echocardiogram 2D Echocardiogram has been performed.  Brandi Bright 04/01/2023, 1:53 PM

## 2023-04-01 NOTE — Consult Note (Signed)
Neurology Consultation Reason for Consult: Facial droop Referring Physician: Agbata, T  CC: Transient facial weakness  History is obtained from: Patient, daughter  HPI: Brandi Bright is a 52 y.o. female with a history of heart attack who presents with transient right facial weakness.  She states that she had some blurred vision at the same time.  She also had a left-sided headache at the time.  The symptoms were resolved by the time she got to the emergency department last night.  She was admitted for possible TIA.  She states that she has had blurred vision in the past, but not necessarily associated with headaches, she has never had facial weakness with headaches in the past.   Past Medical History:  Diagnosis Date   Arthritis    Back pain    Chronic knee pain    Cirrhosis    Gout    Hypothyroidism    IDA (iron deficiency anemia) 01/16/2020   MI, old    Thyroid disease      Family History  Problem Relation Age of Onset   Hypertension Other    CAD Father    Colon cancer Father    Lung cancer Father    Pancreatic cancer Brother      Social History:  reports that she has never smoked. She has never used smokeless tobacco. She reports current alcohol use. She reports current drug use.   Exam: Current vital signs: BP 138/82   Pulse 62   Temp 97.8 F (36.6 C) (Oral)   Resp (!) 22   Ht  (1.448 m)   Wt 95.3 kg   LMP  (LMP Unknown) Comment: last menstrual 9 years ago, tubal ligation  SpO2 93%   BMI 45.44 kg/m  Vital signs in last 24 hours: Temp:  [97.8 F (36.6 C)-98.2 F (36.8 C)] 97.8 F (36.6 C) (04/18 0942) Pulse Rate:  [59-90] 62 (04/18 0900) Resp:  [13-24] 22 (04/18 0900) BP: (104-161)/(61-93) 138/82 (04/18 0900) SpO2:  [93 %-98 %] 93 % (04/18 0900) Weight:  [95.3 kg] 95.3 kg (04/17 2122)   Physical Exam  Appears well-developed and well-nourished.   Neuro: Mental Status: Patient is awake, alert, oriented to person, place, month, year, and  situation. Patient is able to give a clear and coherent history. No signs of aphasia or neglect Cranial Nerves: II: Visual Fields are full. Pupils are equal, round, and reactive to light.   III,IV, VI: EOMI without ptosis or diploplia.  V: Facial sensation is symmetric to temperature VII: Facial movement is symmetric.  VIII: hearing is intact to voice X: Uvula elevates symmetrically XI: Shoulder shrug is symmetric. XII: tongue is midline without atrophy or fasciculations.  Motor: Tone is normal. Bulk is normal. 5/5 strength was present in all four extremities.  Sensory: Sensation is symmetric to light touch and temperature in the arms and legs. Cerebellar: FNF and HKS are intact bilaterally   I have reviewed labs in epic and the results pertinent to this consultation are: A1c 4.6 LDL 89  I have reviewed the images obtained: MRI brain-negative, CTA-negative EKG-no atrial fibrillation  Impression: 52 year old female who presents with transient right facial weakness in the setting of headache.  Possibilities include TIA versus complex migraine.  She does not have extensive evidence of cerebrovascular disease on MRI, but she does have a history of an MI.  Her LDL is only mildly elevated, and without evidence of extensive atherogenic cerebrovascular disease, I am not sure she would benefit from  high intensity statin, but I do think a lower dose of atorvastatin with a goal LDL of less than 70 would be reasonable.  I also think that daily aspirin, especially with her history of heart disease, would be a low risk intervention and I would favor pursuing it in case this was a transient ischemic attack.  I would favor getting an echocardiogram given her heart history, but if no evidence of embolic disease on a TTE  Recommendations: 1) aspirin 81 mg daily 2) Lipitor 20 mg daily for goal LDL less than 70 3) echocardiogram 4) outpatient follow-up with PCP, no need for neurologic follow-up unless  she has further symptoms.   Ritta Slot, MD [facility] [phone]  If 7pm- 7am, please page neurology on call as listed in AMION.

## 2023-04-29 DIAGNOSIS — M542 Cervicalgia: Secondary | ICD-10-CM | POA: Diagnosis not present

## 2023-04-29 DIAGNOSIS — M25569 Pain in unspecified knee: Secondary | ICD-10-CM | POA: Diagnosis not present

## 2023-04-29 DIAGNOSIS — G894 Chronic pain syndrome: Secondary | ICD-10-CM | POA: Diagnosis not present

## 2023-05-05 IMAGING — CR DG RIBS 2V*L*
2 series · 3 of 3 positions shown · non-contrast
Comparison: X-ray chest 12/29/2017.

CLINICAL DATA: Pleurisy. Left-sided chest/rib pain under breast.

EXAM:
LEFT RIBS - 2 VIEW

[Series 7: rib pa obl · 0.14mm/px · 2 of 2 slices shown]
[im 1/2]
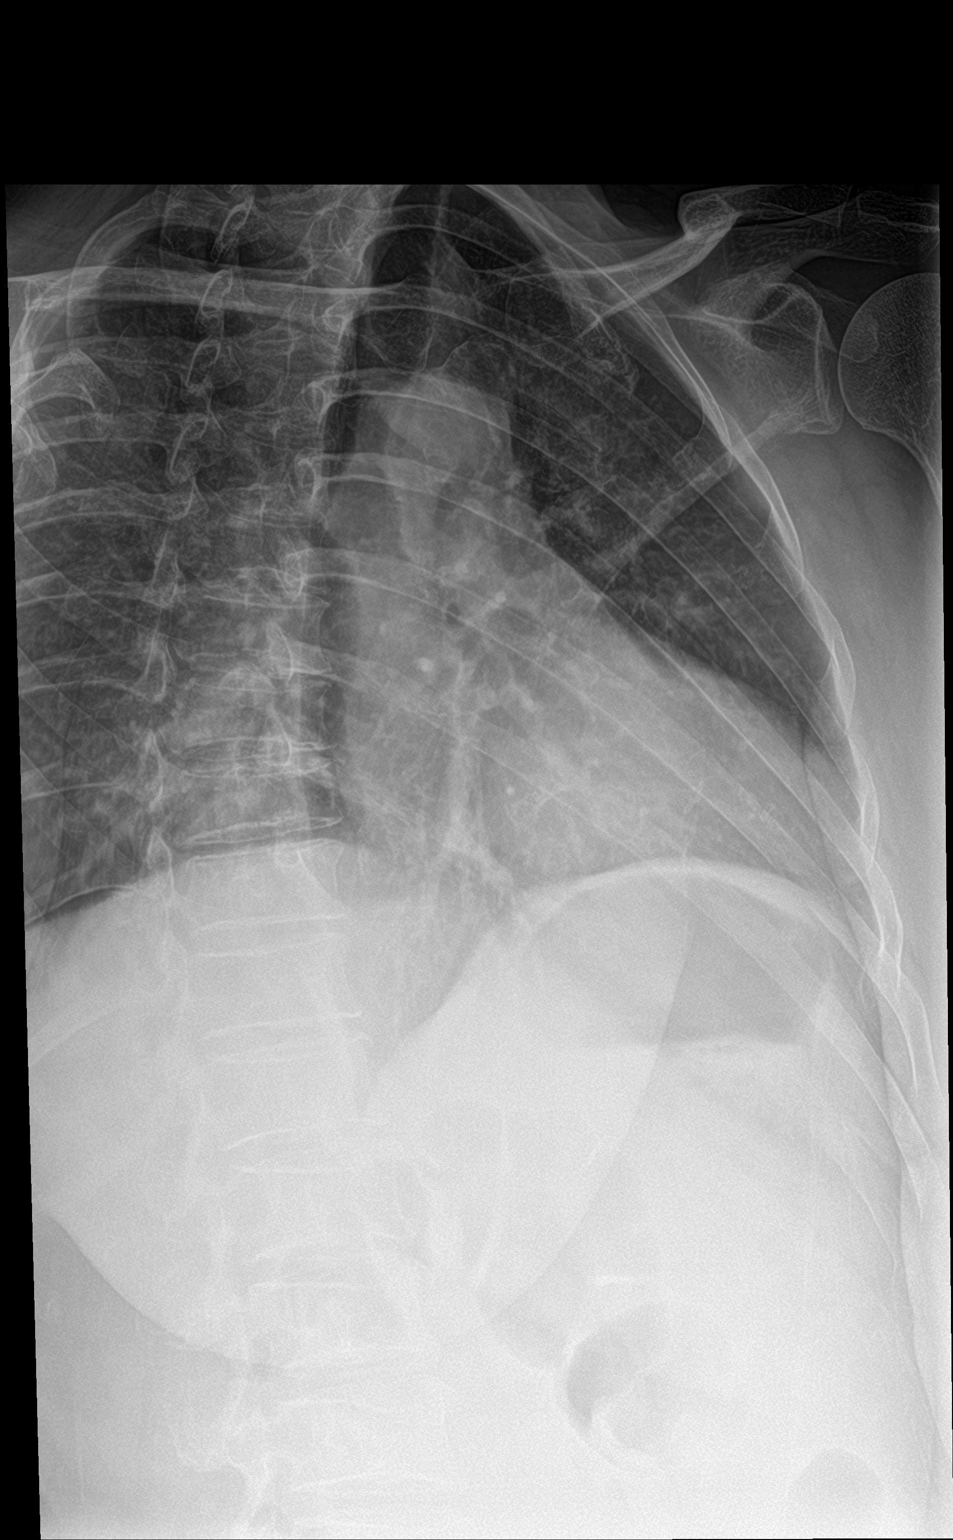
[im 2/2]
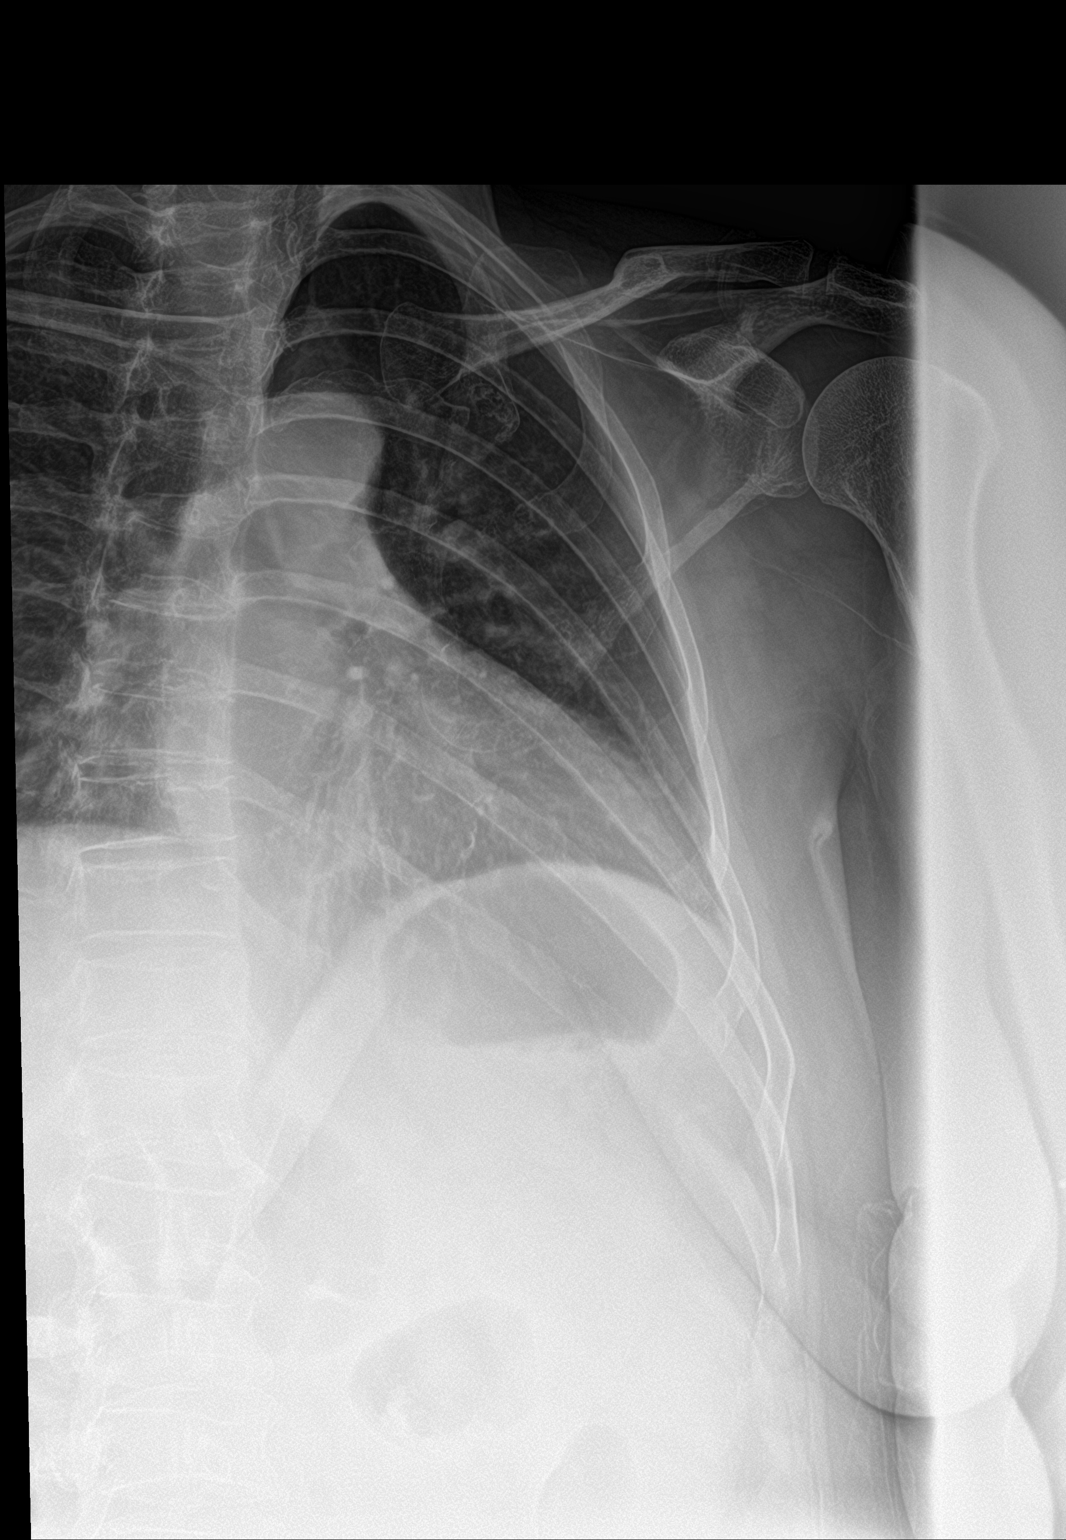

[rib pa]
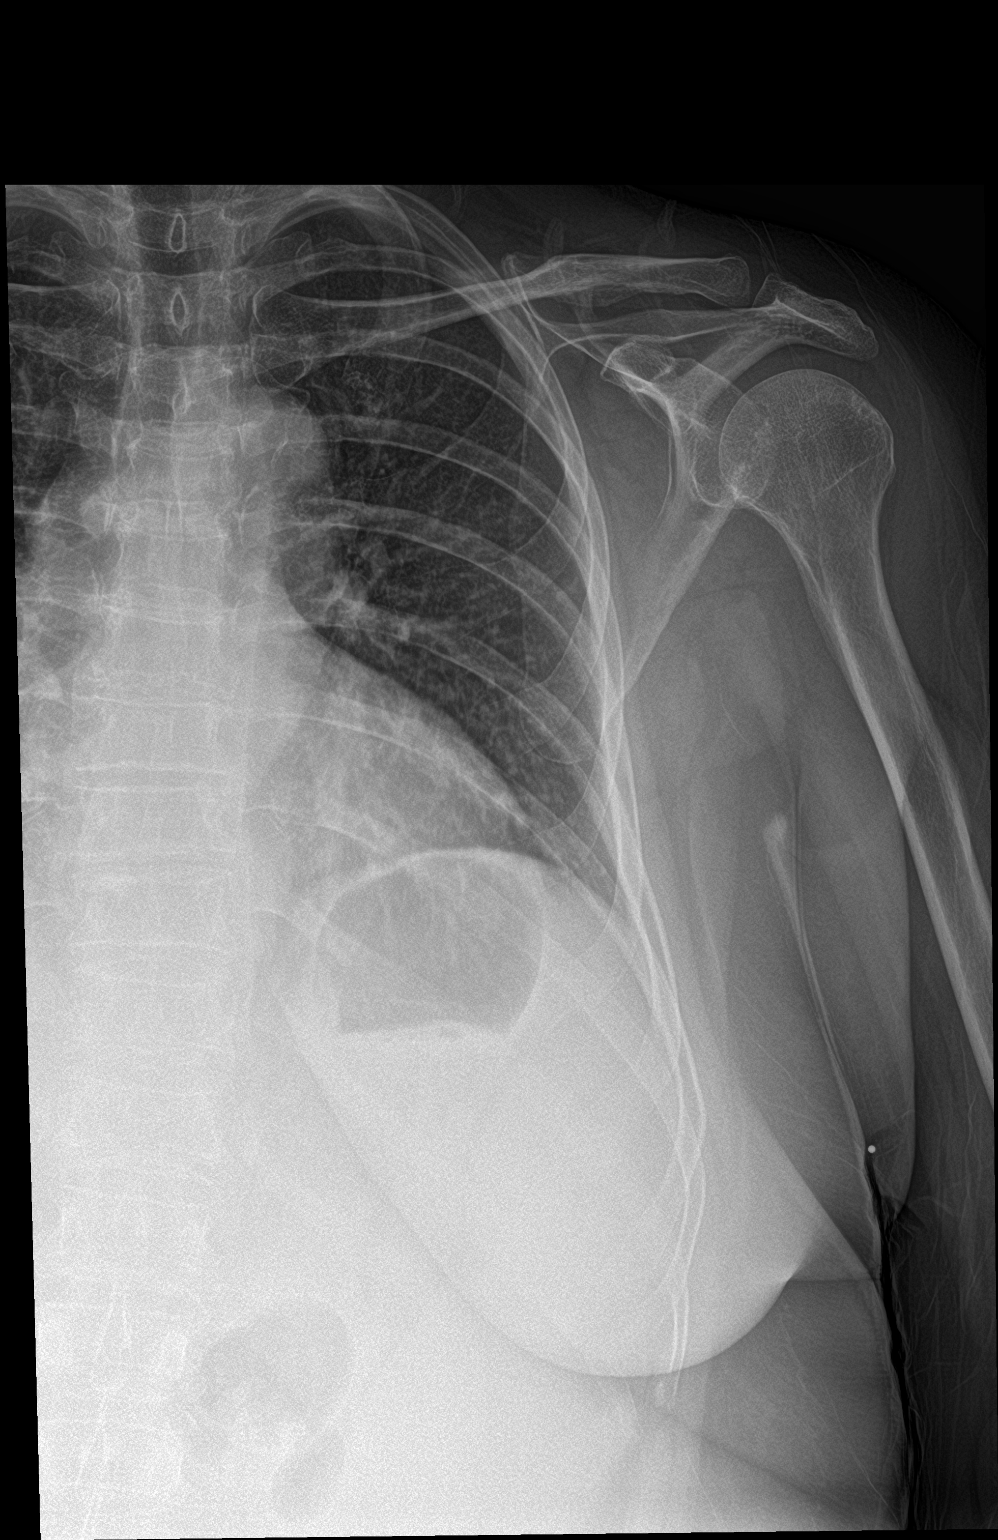

[3 of 3 positions shown; findings below may reference images not displayed]

FINDINGS: Cardiac shadow is within normal limits. Ribs are well visualized and
within normal limits. No acute fracture is seen.
IMPRESSION: No evidence of acute left rib fracture.

## 2023-05-05 IMAGING — CR DG CHEST 2V
2 series · 2 of 2 positions shown · non-contrast
Comparison: 01/10/2020

CLINICAL DATA: 50-year-old female with chest pain

EXAM:
CHEST - 2 VIEW

[chest pa]
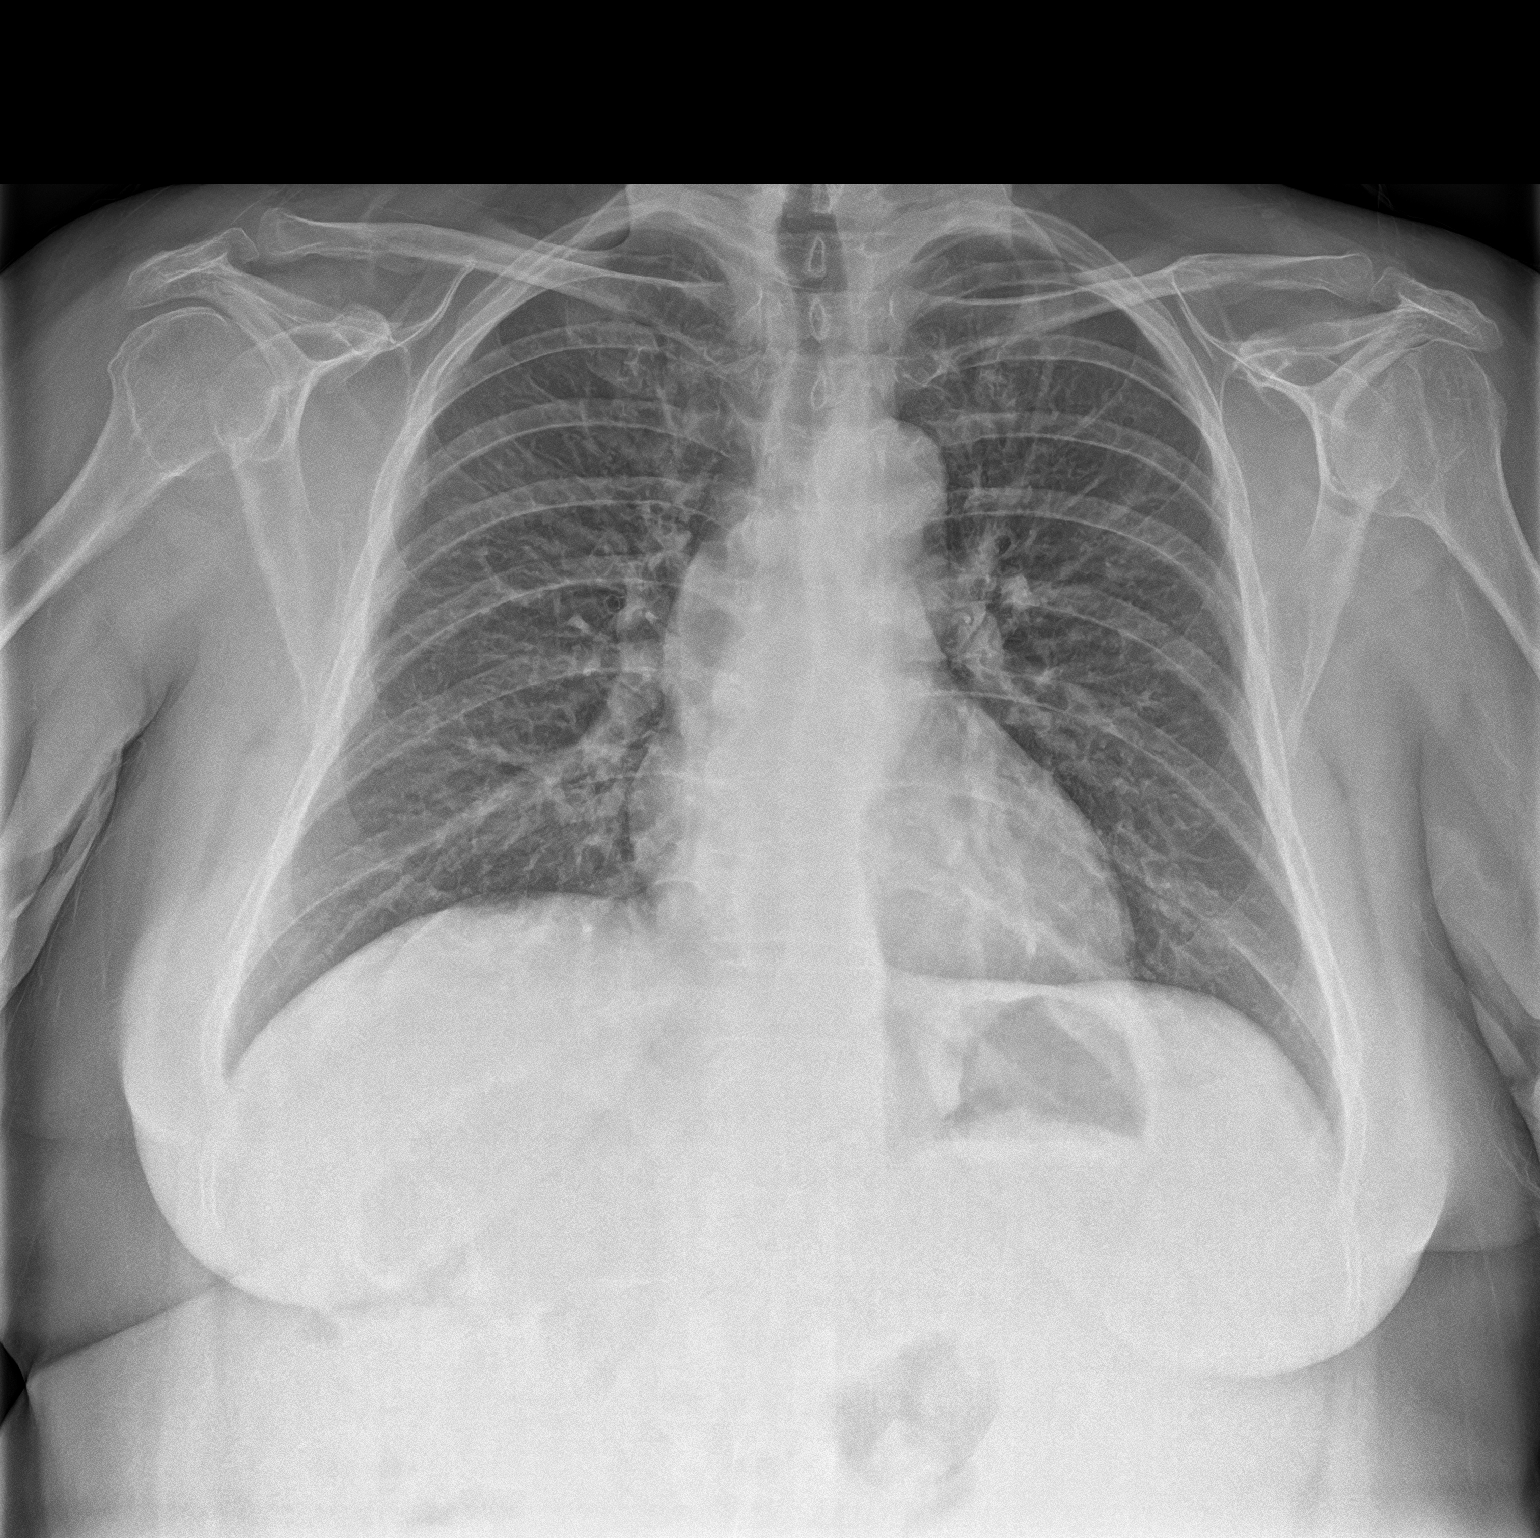

[chest lat]
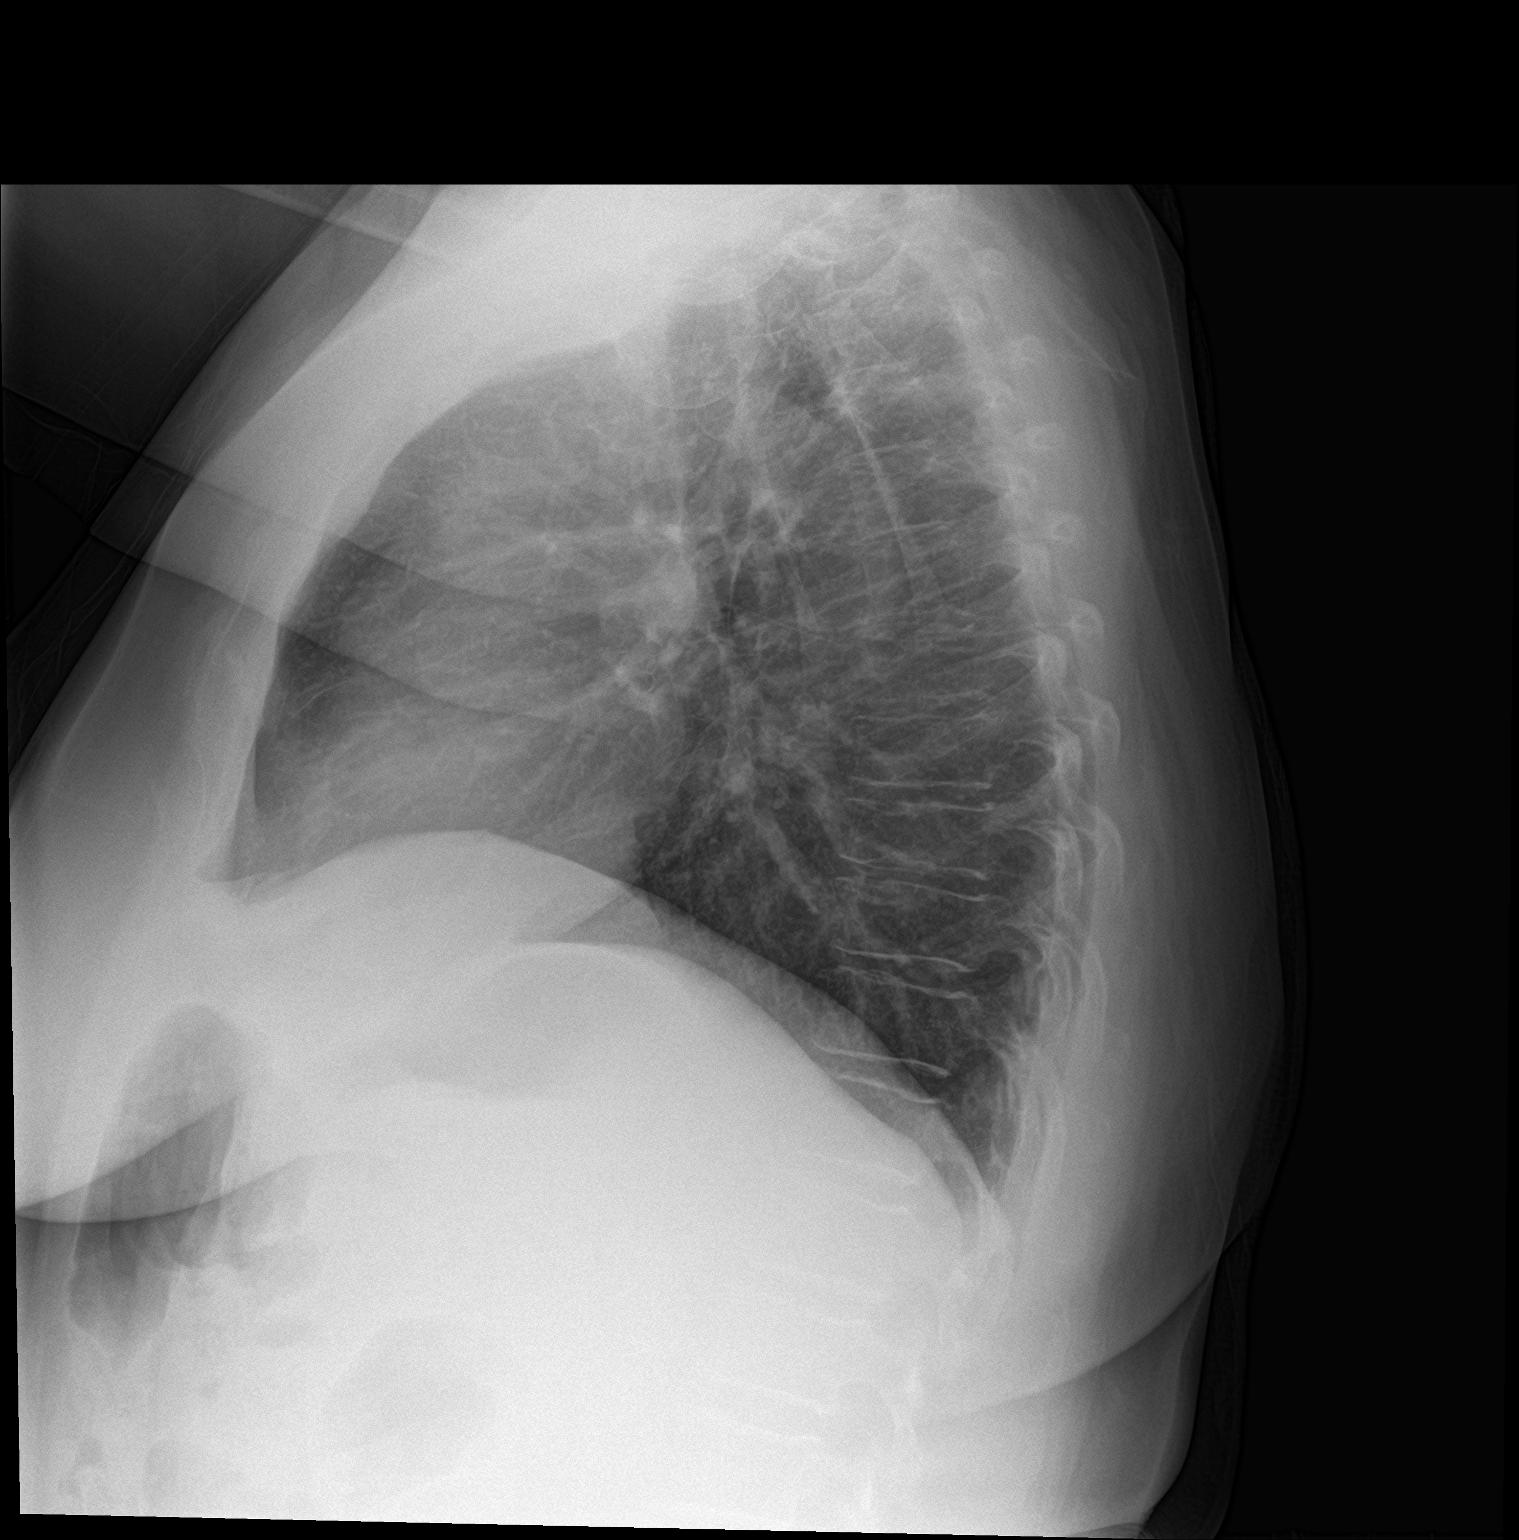

[2 of 2 positions shown; findings below may reference images not displayed]

FINDINGS: Cardiomediastinal silhouette unchanged in size and contour. No
evidence of central vascular congestion. No interlobular septal
thickening.

No pneumothorax or pleural effusion. Coarsened interstitial
markings, with no confluent airspace disease.

No acute displaced fracture. Degenerative changes of the spine.
IMPRESSION: No active cardiopulmonary disease.

## 2023-05-12 ENCOUNTER — Other Ambulatory Visit: Payer: Self-pay

## 2023-05-12 ENCOUNTER — Encounter: Payer: Self-pay | Admitting: Gastroenterology

## 2023-05-12 ENCOUNTER — Ambulatory Visit: Payer: Medicaid Other | Admitting: Gastroenterology

## 2023-05-12 ENCOUNTER — Encounter: Payer: Self-pay | Admitting: Oncology

## 2023-05-12 VITALS — BP 123/82 | HR 80 | Temp 98.2°F | Ht <= 58 in | Wt 214.1 lb

## 2023-05-12 DIAGNOSIS — I8510 Secondary esophageal varices without bleeding: Secondary | ICD-10-CM

## 2023-05-12 DIAGNOSIS — E039 Hypothyroidism, unspecified: Secondary | ICD-10-CM | POA: Diagnosis not present

## 2023-05-12 DIAGNOSIS — K7460 Unspecified cirrhosis of liver: Secondary | ICD-10-CM

## 2023-05-12 DIAGNOSIS — K317 Polyp of stomach and duodenum: Secondary | ICD-10-CM

## 2023-05-12 NOTE — Patient Instructions (Addendum)
Got Ultrasound scheduled for you 05/14/2023 arrived at 8:15am for a 8:30am [facility]. Nothing to eat or drink after midnight. If you need to reschedule please call [phone] option 3 and then option 2.

## 2023-05-12 NOTE — Progress Notes (Signed)
[name], MD [address]  [address]  [place], [place] [address]  Main: [phone]  Fax: [phone]    Gastroenterology Consultation  Referring Provider:     Rickard Patience, MD Primary Care Physician:  Corky Downs, MD Primary Gastroenterologist:  Dr. [name] Reason for Consultation: Cirrhosis of liver        HPI:   Brandi Bright is a 52 y.o. female referred by Dr. Corky Downs, MD  for consultation & management of cirrhosis of liver.  Patient was previously followed by Dr. Maximino Greenland for management of liver cirrhosis.  History of metabolic syndrome, chronic hep C, status posttreatment, achieved SVR, hypothyroidism.  With regards to cirrhosis, she had history of esophageal varices s/p ligation in the past.  Patient denies any rectal bleeding, abdominal distention or swelling of legs.   Upper endoscopy July 2022 Impression:            - Grade I esophageal varices with no bleeding and no                         stigmata of recent bleeding.                        - A single gastric polyp. Biopsied.                        - Multiple gastric polyps. Biopsied.                        - Normal examined duodenum.   Colonoscopy Impression:            - Hemorrhoids found on perianal exam.                        - One 3 mm polyp in the cecum, removed with a cold                         biopsy forceps. Resected and retrieved.                        - One 5 mm polyp in the transverse colon, removed with                         a cold snare. Resected and retrieved.                        - Two 3 to 4 mm polyps in the rectum and in the                         sigmoid colon, removed with a cold biopsy forceps.                         Resected and retrieved.                        - The examination was otherwise normal.                        - The rectum, sigmoid colon, descending colon,  transverse colon, ascending colon and cecum are normal.                         - The distal rectum and anal verge are normal on                         retroflexion view.   DIAGNOSIS:  A. STOMACH POLYP, ANTRUM; COLD BIOPSY:  - HYPERPLASTIC POLYP, ACUTELY INFLAMED.  - NEGATIVE FOR DYSPLASIA AND MALIGNANCY.   Comment:  Immunohistochemical stain for H pylori will be reported in an addendum.   B. STOMACH POLYP; COLD BIOPSY:  - HYPERPLASTIC POLYP.  - NEGATIVE FOR INTESTINAL METAPLASIA, DYSPLASIA, AND MALIGNANCY.   C. COLON POLYP, CECUM; COLD BIOPSY:  - TUBULAR ADENOMA.  - NEGATIVE FOR HIGH GRADE DYSPLASIA AND MALIGNANCY.   D. COLON POLYP, TRANSVERSE; COLD SNARE:  - TUBULAR ADENOMA.  - NEGATIVE FOR HIGH GRADE DYSPLASIA AND MALIGNANCY.   E. COLON POLYP X2, SIGMOID AND RECTUM; COLD BIOPSY:  - HYPERPLASTIC POLYP, TWO FRAGMENTS.  - NEGATIVE FOR DYSPLASIA AND MALIGNANCY.    Antiplts/Anticoagulants/Anti thrombotics: None   Past Medical History:  Diagnosis Date   Arthritis    Back pain    Chronic knee pain    Cirrhosis (HCC)    Gout    Hypothyroidism    IDA (iron deficiency anemia) 01/16/2020   MI, old    Thyroid disease     Past Surgical History:  Procedure Laterality Date   CARDIAC CATHETERIZATION     CESAREAN SECTION     COLONOSCOPY WITH PROPOFOL N/A 03/16/2018   Procedure: COLONOSCOPY WITH PROPOFOL;  Surgeon: Pasty Spillers, MD;  Location: [facility] ENDOSCOPY;  Service: Endoscopy;  Laterality: N/A;   COLONOSCOPY WITH PROPOFOL N/A 07/09/2021   Procedure: COLONOSCOPY WITH PROPOFOL;  Surgeon: Pasty Spillers, MD;  Location: [facility] ENDOSCOPY;  Service: Endoscopy;  Laterality: N/A;   ESOPHAGOGASTRODUODENOSCOPY N/A 10/11/2019   Procedure: ESOPHAGOGASTRODUODENOSCOPY (EGD);  Surgeon: Toney Reil, MD;  Location: [facility] ENDOSCOPY;  Service: Gastroenterology;  Laterality: N/A;   ESOPHAGOGASTRODUODENOSCOPY (EGD) WITH PROPOFOL N/A 10/27/2018   Procedure: ESOPHAGOGASTRODUODENOSCOPY (EGD) WITH PROPOFOL;  Surgeon: Pasty Spillers, MD;   Location: [facility] ENDOSCOPY;  Service: Endoscopy;  Laterality: N/A;   ESOPHAGOGASTRODUODENOSCOPY (EGD) WITH PROPOFOL N/A 06/25/2019   Procedure: ESOPHAGOGASTRODUODENOSCOPY (EGD) WITH PROPOFOL;  Surgeon: Wyline Mood, MD;  Location: [facility] ENDOSCOPY;  Service: Gastroenterology;  Laterality: N/A;   ESOPHAGOGASTRODUODENOSCOPY (EGD) WITH PROPOFOL N/A 09/12/2019   Procedure: ESOPHAGOGASTRODUODENOSCOPY (EGD) WITH PROPOFOL;  Surgeon: Midge Minium, MD;  Location: [facility] ENDOSCOPY;  Service: Endoscopy;  Laterality: N/A;   ESOPHAGOGASTRODUODENOSCOPY (EGD) WITH PROPOFOL N/A 11/20/2019   Procedure: ESOPHAGOGASTRODUODENOSCOPY (EGD) WITH PROPOFOL;  Surgeon: Pasty Spillers, MD;  Location: [facility] ENDOSCOPY;  Service: Endoscopy;  Laterality: N/A;   ESOPHAGOGASTRODUODENOSCOPY (EGD) WITH PROPOFOL N/A 11/23/2019   Procedure: ESOPHAGOGASTRODUODENOSCOPY (EGD) WITH PROPOFOL;  Surgeon: Pasty Spillers, MD;  Location: [facility] ENDOSCOPY;  Service: Endoscopy;  Laterality: N/A;   ESOPHAGOGASTRODUODENOSCOPY (EGD) WITH PROPOFOL N/A 01/03/2020   Procedure: ESOPHAGOGASTRODUODENOSCOPY (EGD) WITH PROPOFOL;  Surgeon: Toney Reil, MD;  Location: [facility] ENDOSCOPY;  Service: Endoscopy;  Laterality: N/A;   ESOPHAGOGASTRODUODENOSCOPY (EGD) WITH PROPOFOL N/A 07/09/2021   Procedure: ESOPHAGOGASTRODUODENOSCOPY (EGD) WITH PROPOFOL;  Surgeon: Pasty Spillers, MD;  Location: [facility] ENDOSCOPY;  Service: Endoscopy;  Laterality: N/A;   TUBAL LIGATION       Current Outpatient Medications:    aspirin EC 81 MG tablet, Take 1 tablet (81 mg total)  by mouth daily. Swallow whole., Disp: 360 tablet, Rfl: 0   levothyroxine (SYNTHROID) 150 MCG tablet, Take 1 tablet p.o. daily, Disp: 30 tablet, Rfl: 6   nitroGLYCERIN (NITROLINGUAL) 0.4 MG/SPRAY spray, Place 1 spray under the tongue every 5 (five) minutes x 3 doses as needed for chest pain., Disp: 12 g, Rfl: 2   omeprazole (PRILOSEC) 20 MG capsule, Take 1 capsule (20 mg total) by mouth daily., Disp: 90  capsule, Rfl: 0   oxyCODONE (OXYCONTIN) 15 mg 12 hr tablet, 15 mg every 12 (twelve) hours., Disp: , Rfl:    Oxycodone HCl 10 MG TABS, Take 10 mg by mouth 4 (four) times daily as needed., Disp: , Rfl:    ZTLIDO 1.8 % PTCH, Apply 1 patch topically at bedtime., Disp: , Rfl:    Family History  Problem Relation Age of Onset   Hypertension Other    CAD Father    Colon cancer Father    Lung cancer Father    Pancreatic cancer Brother      Social History   Tobacco Use   Smoking status: Never   Smokeless tobacco: Never  Vaping Use   Vaping Use: Never used  Substance Use Topics   Alcohol use: Yes   Drug use: Yes    Comment: prescribed oxycodone and oxycotton    Allergies as of 05/12/2023 - Review Complete 05/12/2023  Allergen Reaction Noted   Vicodin [hydrocodone-acetaminophen] Rash 01/11/2016   Amoxicillin Rash and Other (See Comments) 01/11/2016   Gabapentin Rash 01/11/2016    Review of Systems:    All systems reviewed and negative except where noted in HPI.   Physical Exam:  BP 123/82 (BP Location: Left Arm, Patient Position: Sitting, Cuff Size: Normal)   Pulse 80   Temp 98.2 F (36.8 C) (Oral)   Ht 4\' 9"  (1.448 m)   Wt 214 lb 2 oz (97.1 kg)   LMP  (LMP Unknown) Comment: last menstrual 9 years ago, tubal ligation  BMI 46.34 kg/m  No LMP recorded (lmp unknown). Patient is postmenopausal.  General:   Alert,  Well-developed, well-nourished, pleasant and cooperative in NAD Head:  Normocephalic and atraumatic. Eyes:  Sclera clear, no icterus.   Conjunctiva pink. Ears:  Normal auditory acuity. Nose:  No deformity, discharge, or lesions. Mouth:  No deformity or lesions,oropharynx pink & moist. Neck:  Supple; no masses or thyromegaly. Lungs:  Respirations even and unlabored.  Clear throughout to auscultation.   No wheezes, crackles, or rhonchi. No acute distress. Heart:  Regular rate and rhythm; no murmurs, clicks, rubs, or gallops. Abdomen:  Normal bowel sounds. Soft,  non-tender and non-distended without masses, hepatosplenomegaly or hernias noted.  No guarding or rebound tenderness.   Rectal: Not performed Msk:  Symmetrical without gross deformities. Good, equal movement & strength bilaterally. Pulses:  Normal pulses noted. Extremities:  No clubbing or edema.  No cyanosis. Neurologic:  Alert and oriented x3;  grossly normal neurologically. Skin:  Intact without significant lesions or rashes. No jaundice. Psych:  Alert and cooperative. Normal mood and affect.  Imaging Studies: Reviewed  Assessment and Plan:   Brandi Bright is a 52 y.o. female with history of compensated cirrhosis of liver, metabolic syndrome, hypothyroidism, history of chronic hep C, esophageal varices s/p ligation in the past, chronic thrombocytopenia secondary to portal hypertension  Compensated cirrhosis of liver, combination of fatty liver and history of hep C status posttreatment Portal hypertension manifested by esophageal varices and thrombocytopenia Recommend EGD for variceal surveillance Currently euvolemic, continue  low-sodium diet PSE: None HCC screening: Recommend right upper quadrant ultrasound and AFP levels   Follow up in 6 months   [name], MD

## 2023-05-13 ENCOUNTER — Encounter: Payer: Self-pay | Admitting: Gastroenterology

## 2023-05-13 DIAGNOSIS — I8510 Secondary esophageal varices without bleeding: Secondary | ICD-10-CM

## 2023-05-13 DIAGNOSIS — K7460 Unspecified cirrhosis of liver: Secondary | ICD-10-CM

## 2023-05-13 DIAGNOSIS — M179 Osteoarthritis of knee, unspecified: Secondary | ICD-10-CM | POA: Insufficient documentation

## 2023-05-13 LAB — TSH: TSH: 0.014 u[IU]/mL — ABNORMAL LOW (ref 0.450–4.500)

## 2023-05-13 LAB — AFP TUMOR MARKER: AFP, Serum, Tumor Marker: <1.8 ng/mL (ref 0.0–9.2)

## 2023-05-13 LAB — T4, FREE: Free T4: 2.17 ng/dL — ABNORMAL HIGH (ref 0.82–1.77)

## 2023-05-13 LAB — T3: T3, Total: 201 ng/dL — ABNORMAL HIGH (ref 71–180)

## 2023-05-14 ENCOUNTER — Ambulatory Visit
Admission: RE | Admit: 2023-05-14 | Discharge: 2023-05-14 | Disposition: A | Discharge status: Home / Self Care | Payer: Medicaid Other | Source: Ambulatory Visit | Attending: Gastroenterology | Admitting: Gastroenterology

## 2023-05-14 ENCOUNTER — Encounter: Payer: Self-pay | Admitting: Gastroenterology

## 2023-05-14 DIAGNOSIS — I10 Essential (primary) hypertension: Secondary | ICD-10-CM | POA: Diagnosis not present

## 2023-05-14 DIAGNOSIS — K7460 Unspecified cirrhosis of liver: Secondary | ICD-10-CM | POA: Insufficient documentation

## 2023-05-17 ENCOUNTER — Other Ambulatory Visit: Payer: Self-pay

## 2023-05-17 ENCOUNTER — Ambulatory Visit: Payer: Medicaid Other | Admitting: Anesthesiology

## 2023-05-17 ENCOUNTER — Encounter: Payer: Self-pay | Admitting: Gastroenterology

## 2023-05-17 ENCOUNTER — Ambulatory Visit
Admission: RE | Admit: 2023-05-17 | Discharge: 2023-05-17 | Disposition: A | Discharge status: Home / Self Care | Payer: Medicaid Other | Attending: Gastroenterology | Admitting: Gastroenterology

## 2023-05-17 ENCOUNTER — Encounter
Admission: RE | Disposition: A | Discharge status: Home / Self Care | Payer: Self-pay | Source: Home / Self Care | Attending: Gastroenterology

## 2023-05-17 ENCOUNTER — Telehealth: Payer: Self-pay

## 2023-05-17 DIAGNOSIS — I2510 Atherosclerotic heart disease of native coronary artery without angina pectoris: Secondary | ICD-10-CM | POA: Diagnosis not present

## 2023-05-17 DIAGNOSIS — I10 Essential (primary) hypertension: Secondary | ICD-10-CM | POA: Diagnosis not present

## 2023-05-17 DIAGNOSIS — E0590 Thyrotoxicosis, unspecified without thyrotoxic crisis or storm: Secondary | ICD-10-CM | POA: Diagnosis not present

## 2023-05-17 DIAGNOSIS — Z8249 Family history of ischemic heart disease and other diseases of the circulatory system: Secondary | ICD-10-CM | POA: Insufficient documentation

## 2023-05-17 DIAGNOSIS — K219 Gastro-esophageal reflux disease without esophagitis: Secondary | ICD-10-CM | POA: Insufficient documentation

## 2023-05-17 DIAGNOSIS — I252 Old myocardial infarction: Secondary | ICD-10-CM | POA: Diagnosis not present

## 2023-05-17 DIAGNOSIS — K7460 Unspecified cirrhosis of liver: Secondary | ICD-10-CM | POA: Diagnosis not present

## 2023-05-17 DIAGNOSIS — M1990 Unspecified osteoarthritis, unspecified site: Secondary | ICD-10-CM | POA: Insufficient documentation

## 2023-05-17 DIAGNOSIS — I8510 Secondary esophageal varices without bleeding: Secondary | ICD-10-CM | POA: Insufficient documentation

## 2023-05-17 DIAGNOSIS — D696 Thrombocytopenia, unspecified: Secondary | ICD-10-CM | POA: Diagnosis not present

## 2023-05-17 DIAGNOSIS — K317 Polyp of stomach and duodenum: Secondary | ICD-10-CM | POA: Diagnosis not present

## 2023-05-17 DIAGNOSIS — I8500 Esophageal varices without bleeding: Secondary | ICD-10-CM

## 2023-05-17 HISTORY — DX: Hyperlipidemia, unspecified: E78.5

## 2023-05-17 HISTORY — PX: ESOPHAGOGASTRODUODENOSCOPY (EGD) WITH PROPOFOL: SHX5813

## 2023-05-17 SURGERY — ESOPHAGOGASTRODUODENOSCOPY (EGD) WITH PROPOFOL
Anesthesia: General

## 2023-05-17 MED ORDER — DEXMEDETOMIDINE HCL 200 MCG/2ML IV SOLN
INTRAVENOUS | Status: DC | PRN
  Administered 2023-05-17: 16 ug via INTRAVENOUS

## 2023-05-17 MED ORDER — SODIUM CHLORIDE 0.9 % IV SOLN: INTRAVENOUS | Status: DC

## 2023-05-17 MED ORDER — GLYCOPYRROLATE 0.2 MG/ML IJ SOLN
INTRAMUSCULAR | Status: DC | PRN
  Administered 2023-05-17: .2 mg via INTRAVENOUS

## 2023-05-17 MED ORDER — LIDOCAINE HCL (CARDIAC) PF 100 MG/5ML IV SOSY
PREFILLED_SYRINGE | INTRAVENOUS | Status: DC | PRN
  Administered 2023-05-17: 100 mg via INTRAVENOUS

## 2023-05-17 MED ORDER — PROPOFOL 10 MG/ML IV BOLUS
INTRAVENOUS | Status: DC | PRN
  Administered 2023-05-17: 100 mg via INTRAVENOUS

## 2023-05-17 MED ORDER — PROPOFOL 500 MG/50ML IV EMUL
INTRAVENOUS | Status: DC | PRN
  Administered 2023-05-17: 160.714 ug/kg/min via INTRAVENOUS

## 2023-05-17 NOTE — Anesthesia Preprocedure Evaluation (Addendum)
Anesthesia Evaluation  Patient identified by MRN, date of birth, ID band Patient awake    Reviewed: Allergy & Precautions, NPO status , Patient's Chart, lab work & pertinent test results  Airway Mallampati: III  TM Distance: >3 FB Neck ROM: full    Dental no notable dental hx.    Pulmonary neg pulmonary ROS   Pulmonary exam normal        Cardiovascular hypertension, (-) angina + CAD and + Past MI  Normal cardiovascular exam     Neuro/Psych  PSYCHIATRIC DISORDERS Anxiety Depression    04/01/2023: Per neurology- Possibilities include TIA versus complex migraine. Patient presented for evaluation of transient right facial droop which has resolved. Neurology was consulted and they recommended observation to rule out an acute stroke. Initial CT scan of the head was negative for an acute bleed CT angiogram was negative for LVO Patient had a normal MRI of the brain 2D echocardiogram showed normal LVEF with no evidence of intra-atrial shunt Patient was seen and evaluated by PT/OT/ST and has no needs She will be discharged on aspirin and statin     GI/Hepatic ,GERD  ,,(+) Cirrhosis         Endo/Other    Morbid obesityRecent lab work indicating hyperthyroidism. Pt has no symptoms of hyperthyroidism today  Renal/GU negative Renal ROS  negative genitourinary   Musculoskeletal  (+) Arthritis ,    Abdominal  (+) + obese  Peds  Hematology  (+) Blood dyscrasia, anemia Thrombocytopenia   Anesthesia Other Findings Past Medical History: No date: Arthritis No date: Back pain No date: Gout No date: Hypothyroidism 01/16/2020: IDA (iron deficiency anemia) No date: MI, old No date: Thyroid disease  Past Surgical History: No date: CARDIAC CATHETERIZATION No date: CESAREAN SECTION 03/16/2018: COLONOSCOPY WITH PROPOFOL; N/A     Comment:  Procedure: COLONOSCOPY WITH PROPOFOL;  Surgeon:               Pasty Spillers, MD;   Location: [facility] ENDOSCOPY;                Service: Endoscopy;  Laterality: N/A; 10/11/2019: ESOPHAGOGASTRODUODENOSCOPY; N/A     Comment:  Procedure: ESOPHAGOGASTRODUODENOSCOPY (EGD);  Surgeon:               Toney Reil, MD;  Location: [facility] ENDOSCOPY;                Service: Gastroenterology;  Laterality: N/A; 10/27/2018: ESOPHAGOGASTRODUODENOSCOPY (EGD) WITH PROPOFOL; N/A     Comment:  Procedure: ESOPHAGOGASTRODUODENOSCOPY (EGD) WITH               PROPOFOL;  Surgeon: Pasty Spillers, MD;  Location:               [facility] ENDOSCOPY;  Service: Endoscopy;  Laterality: N/A; 06/25/2019: ESOPHAGOGASTRODUODENOSCOPY (EGD) WITH PROPOFOL; N/A     Comment:  Procedure: ESOPHAGOGASTRODUODENOSCOPY (EGD) WITH               PROPOFOL;  Surgeon: Wyline Mood, MD;  Location: [facility]               ENDOSCOPY;  Service: Gastroenterology;  Laterality: N/A; 09/12/2019: ESOPHAGOGASTRODUODENOSCOPY (EGD) WITH PROPOFOL; N/A     Comment:  Procedure: ESOPHAGOGASTRODUODENOSCOPY (EGD) WITH               PROPOFOL;  Surgeon: Midge Minium, MD;  Location: [facility]               ENDOSCOPY;  Service: Endoscopy;  Laterality: N/A; 11/20/2019: ESOPHAGOGASTRODUODENOSCOPY (  EGD) WITH PROPOFOL; N/A     Comment:  Procedure: ESOPHAGOGASTRODUODENOSCOPY (EGD) WITH               PROPOFOL;  Surgeon: Pasty Spillers, MD;  Location:               [facility] ENDOSCOPY;  Service: Endoscopy;  Laterality: N/A; 11/23/2019: ESOPHAGOGASTRODUODENOSCOPY (EGD) WITH PROPOFOL; N/A     Comment:  Procedure: ESOPHAGOGASTRODUODENOSCOPY (EGD) WITH               PROPOFOL;  Surgeon: Pasty Spillers, MD;  Location:               [facility] ENDOSCOPY;  Service: Endoscopy;  Laterality: N/A; 01/03/2020: ESOPHAGOGASTRODUODENOSCOPY (EGD) WITH PROPOFOL; N/A     Comment:  Procedure: ESOPHAGOGASTRODUODENOSCOPY (EGD) WITH               PROPOFOL;  Surgeon: Toney Reil, MD;  Location:               [facility] ENDOSCOPY;  Service: Endoscopy;  Laterality: N/A; No date: TUBAL  LIGATION  BMI    Body Mass Index: 43.28 kg/m      Reproductive/Obstetrics negative OB ROS                             Anesthesia Physical Anesthesia Plan  ASA: 3  Anesthesia Plan: General   Post-op Pain Management:    Induction: Intravenous  PONV Risk Score and Plan: Propofol infusion and TIVA  Airway Management Planned: Natural Airway and Simple Face Mask  Additional Equipment:   Intra-op Plan:   Post-operative Plan:   Informed Consent: I have reviewed the patients History and Physical, chart, labs and discussed the procedure including the risks, benefits and alternatives for the proposed anesthesia with the patient or authorized representative who has indicated his/her understanding and acceptance.     Dental Advisory Given  Plan Discussed with: Anesthesiologist, CRNA and Surgeon  Anesthesia Plan Comments:         Anesthesia Quick Evaluation

## 2023-05-17 NOTE — Transfer of Care (Signed)
Immediate Anesthesia Transfer of Care Note  Patient: Brandi Bright  Procedure(s) Performed: ESOPHAGOGASTRODUODENOSCOPY (EGD) WITH PROPOFOL  Patient Location: Endoscopy Unit  Anesthesia Type:General  Level of Consciousness: drowsy  Airway & Oxygen Therapy: Patient Spontanous Breathing  Post-op Assessment: Report given to RN and Post -op Vital signs reviewed and stable  Post vital signs: Reviewed and stable  Last Vitals:  Vitals Value Taken Time  BP 97/61 05/17/23 1041  Temp 36.2 C 05/17/23 1041  Pulse 76 05/17/23 1041  Resp 22 05/17/23 1041  SpO2 95 % 05/17/23 1041    Last Pain:  Vitals:   05/17/23 1041  TempSrc: Temporal  PainSc: Asleep         Complications: No notable events documented.

## 2023-05-17 NOTE — Anesthesia Postprocedure Evaluation (Signed)
Anesthesia Post Note  Patient: IZNA KNEER  Procedure(s) Performed: ESOPHAGOGASTRODUODENOSCOPY (EGD) WITH PROPOFOL  Patient location during evaluation: Endoscopy Anesthesia Type: General Level of consciousness: awake and alert Pain management: pain level controlled Vital Signs Assessment: post-procedure vital signs reviewed and stable Respiratory status: spontaneous breathing, nonlabored ventilation and respiratory function stable Cardiovascular status: blood pressure returned to baseline and stable Postop Assessment: no apparent nausea or vomiting Anesthetic complications: no   No notable events documented.   Last Vitals:  Vitals:   05/17/23 1051 05/17/23 1101  BP: 110/71 (!) 121/106  Pulse: 78 66  Resp: (!) 23 19  Temp:    SpO2: 97% 95%    Last Pain:  Vitals:   05/17/23 1051  TempSrc:   PainSc: 0-No pain                 Foye Deer

## 2023-05-17 NOTE — Telephone Encounter (Signed)
-----   Message from Toney Reil, MD sent at 05/16/2023  6:09 PM EDT ----- No liver lesions, recommend HCC screening with repeat ultrasound liver in 6 months  RV

## 2023-05-17 NOTE — Op Note (Signed)
[facility] Gastroenterology Patient Name: Brandi Bright Procedure Date: 05/17/2023 10:13 AM MRN: [id number] Account #: [id number] Date of Birth: Feb 28, 1971 Admit Type: Outpatient Age: 52 Room: [facility] ENDO ROOM 4 Gender: Female Note Status: Finalized Instrument Name: Upper Endoscope [id number] Procedure:             Upper GI endoscopy Indications:           Esophageal varices, Follow-up of esophageal varices,                         For therapy of esophageal varices Providers:             Toney Reil MD, MD Medicines:             General Anesthesia Complications:         No immediate complications. Estimated blood loss: None. Procedure:             Pre-Anesthesia Assessment:                        - Prior to the procedure, a History and Physical was                         performed, and patient medications and allergies were                         reviewed. The patient is competent. The risks and                         benefits of the procedure and the sedation options and                         risks were discussed with the patient. All questions                         were answered and informed consent was obtained.                         Patient identification and proposed procedure were                         verified by the physician, the nurse, the                         anesthesiologist, the anesthetist and the technician                         in the pre-procedure area in the procedure room in the                         endoscopy suite. Mental Status Examination: alert and                         oriented. Airway Examination: normal oropharyngeal                         airway and neck mobility. Respiratory Examination:  clear to auscultation. CV Examination: normal.                         Prophylactic Antibiotics: The patient does not require                         prophylactic antibiotics. Prior Anticoagulants: The                          patient has taken no anticoagulant or antiplatelet                         agents. ASA Grade Assessment: III - A patient with                         severe systemic disease. After reviewing the risks and                         benefits, the patient was deemed in satisfactory                         condition to undergo the procedure. The anesthesia                         plan was to use general anesthesia. Immediately prior                         to administration of medications, the patient was                         re-assessed for adequacy to receive sedatives. The                         heart rate, respiratory rate, oxygen saturations,                         blood pressure, adequacy of pulmonary ventilation, and                         response to care were monitored throughout the                         procedure. The physical status of the patient was                         re-assessed after the procedure.                        After obtaining informed consent, the endoscope was                         passed under direct vision. Throughout the procedure,                         the patient's blood pressure, pulse, and oxygen                         saturations were monitored continuously. The Endoscope  was introduced through the mouth, and advanced to the                         second part of duodenum. The upper GI endoscopy was                         accomplished without difficulty. The patient tolerated                         the procedure well. Findings:      The duodenal bulb and second portion of the duodenum were normal.      A single large sessile polyp with no bleeding and stigmata of recent       bleeding was found in the prepyloric region of the stomach.      One column of large (> 5 mm) varices with no bleeding and no stigmata of       recent bleeding were found in the lower third of the esophagus, 30 cm       from  the incisors. Red wale signs were present. Scarring from prior       treatment was visible. Evidence of partial eradication was visible. One       band was successfully placed with complete eradication, resulting in       deflation of varices. There was no bleeding during and at the end of the       procedure. Impression:            - Normal duodenal bulb and second portion of the                         duodenum.                        - A single gastric polyp.                        - Large (> 5 mm) esophageal varices with no bleeding                         and no stigmata of recent bleeding. Completely                         eradicated. Banded.                        - No specimens collected. Recommendation:        - Discharge patient to home (with escort).                        - Resume previous diet today.                        - Continue present medications.                        - Repeat upper endoscopy in 8 weeks for endoscopic                         band ligation. Procedure Code(s):     --- Professional ---  43244, Esophagogastroduodenoscopy, flexible,                         transoral; with band ligation of esophageal/gastric                         varices Diagnosis Code(s):     --- Professional ---                        K31.7, Polyp of stomach and duodenum                        I85.00, Esophageal varices without bleeding CPT copyright 2022 American Medical Association. All rights reserved. The codes documented in this report are preliminary and upon coder review may  be revised to meet current compliance requirements. Dr. Libby Maw Toney Reil MD, MD 05/17/2023 10:44:50 AM This report has been signed electronically. Number of Addenda: 0 Note Initiated On: 05/17/2023 10:13 AM Estimated Blood Loss:  Estimated blood loss: none.      [facility]

## 2023-05-17 NOTE — Telephone Encounter (Signed)
Patient verbalized understanding of results and put a reminder for 6 months

## 2023-05-17 NOTE — H&P (Signed)
[name], MD [address]  [address]  [place], [place] [address]  Main: [phone]  Fax: [phone] Pager: [phone]  Primary Care Physician:  Corky Downs, MD Primary Gastroenterologist:  Dr. [name]  Pre-Procedure History & Physical: HPI:  Brandi Bright is a 52 y.o. female is here for an endoscopy.   Past Medical History:  Diagnosis Date   Arthritis    Back pain    Chronic knee pain    Cirrhosis (HCC)    Gout    HLD (hyperlipidemia)    Hypothyroidism    IDA (iron deficiency anemia) 01/16/2020   MI, old    Thyroid disease     Past Surgical History:  Procedure Laterality Date   CARDIAC CATHETERIZATION     CESAREAN SECTION     COLONOSCOPY WITH PROPOFOL N/A 03/16/2018   Procedure: COLONOSCOPY WITH PROPOFOL;  Surgeon: Pasty Spillers, MD;  Location: [facility] ENDOSCOPY;  Service: Endoscopy;  Laterality: N/A;   COLONOSCOPY WITH PROPOFOL N/A 07/09/2021   Procedure: COLONOSCOPY WITH PROPOFOL;  Surgeon: Pasty Spillers, MD;  Location: [facility] ENDOSCOPY;  Service: Endoscopy;  Laterality: N/A;   ESOPHAGOGASTRODUODENOSCOPY N/A 10/11/2019   Procedure: ESOPHAGOGASTRODUODENOSCOPY (EGD);  Surgeon: Toney Reil, MD;  Location: [facility] ENDOSCOPY;  Service: Gastroenterology;  Laterality: N/A;   ESOPHAGOGASTRODUODENOSCOPY (EGD) WITH PROPOFOL N/A 10/27/2018   Procedure: ESOPHAGOGASTRODUODENOSCOPY (EGD) WITH PROPOFOL;  Surgeon: Pasty Spillers, MD;  Location: [facility] ENDOSCOPY;  Service: Endoscopy;  Laterality: N/A;   ESOPHAGOGASTRODUODENOSCOPY (EGD) WITH PROPOFOL N/A 06/25/2019   Procedure: ESOPHAGOGASTRODUODENOSCOPY (EGD) WITH PROPOFOL;  Surgeon: Wyline Mood, MD;  Location[facility] END[facility]COPY;  Service: Gastroenterology;  Laterality: N/A;   ESOPHAGOGASTRODUODENOSCOPY (EGD) WITH PROPOFOL N/A 09/12/2019   Procedure: ESOPHAGOGASTRODUODENOSCOPY (EGD) WITH PROPOFOL;  Surgeon: Midge Minium, MD;  Location: [facility] ENDOSCOPY;  Service: Endoscopy;  Laterality: N/A;    ESOPHAGOGASTRODUODENOSCOPY (EGD) WITH PROPOFOL N/A 11/20/2019   Procedure: ESOPHAGOGASTRODUODENOSCOPY (EGD) WITH PROPOFOL;  Surgeon: Pasty Spillers, MD;  Location: [facility] ENDOSCOPY;  Service: Endoscopy;  Laterality: N/A;   ESOPHAGOGASTRODUODENOSCOPY (EGD) WITH PROPOFOL N/A 11/23/2019   Procedure: ESOPHAGOGASTRODUODENOSCOPY (EGD) WITH PROPOFOL;  Surgeon: Pasty Spillers, MD;  Location: [facility] ENDOSCOPY;  Service: Endoscopy;  Laterality: N/A;   ESOPHAGOGASTRODUODENOSCOPY (EGD) WITH PROPOFOL N/A 01/03/2020   Procedure: ESOPHAGOGASTRODUODENOSCOPY (EGD) WITH PROPOFOL;  Surgeon: Toney Reil, MD;  Location: [facility] ENDOSCOPY;  Service: Endoscopy;  Laterality: N/A;   ESOPHAGOGASTRODUODENOSCOPY (EGD) WITH PROPOFOL N/A 07/09/2021   Procedure: ESOPHAGOGASTRODUODENOSCOPY (EGD) WITH PROPOFOL;  Surgeon: Pasty Spillers, MD;  Location: [facility] ENDOSCOPY;  Service: Endoscopy;  Laterality: N/A;   TUBAL LIGATION      Prior to Admission medications   Medication Sig Start Date End Date Taking? Authorizing Provider  levothyroxine (SYNTHROID) 150 MCG tablet Take 1 tablet p.o. daily 04/01/23  Yes Agbata, Tochukwu, MD  omeprazole (PRILOSEC) 20 MG capsule Take 1 capsule (20 mg total) by mouth daily. 09/30/22  Yes Tressia Danas, MD  oxyCODONE (OXYCONTIN) 15 mg 12 hr tablet 15 mg every 12 (twelve) hours. 06/09/22  Yes [provider]  Oxycodone HCl 10 MG TABS Take 10 mg by mouth 4 (four) times daily as needed. 10/14/20  Yes [provider]  rosuvastatin (CRESTOR) 5 MG tablet Take 5 mg by mouth daily.   Yes [provider]  ZTLIDO 1.8 % PTCH Apply 1 patch topically at bedtime. 07/10/22  Yes [provider]  aspirin EC 81 MG tablet Take 1 tablet (81 mg total) by mouth daily. Swallow whole. 04/01/23 03/31/24  Agbata, Tochukwu,  MD  nitroGLYCERIN (NITROLINGUAL) 0.4 MG/SPRAY spray Place 1 spray under the tongue every 5 (five) minutes x 3 doses as needed for chest pain. 04/24/20    Corky Downs, MD    Allergies as of 05/12/2023 - Review Complete 05/12/2023  Allergen Reaction Noted   Vicodin [hydrocodone-acetaminophen] Rash 01/11/2016   Amoxicillin Rash and Other (See Comments) 01/11/2016   Gabapentin Rash 01/11/2016    Family History  Problem Relation Age of Onset   Hypertension Other    CAD Father    Colon cancer Father    Lung cancer Father    Pancreatic cancer Brother     Social History   Socioeconomic History   Marital status: Single    Spouse name: Not on file   Number of children: Not on file   Years of education: Not on file   Highest education level: Not on file  Occupational History   Not on file  Tobacco Use   Smoking status: Never   Smokeless tobacco: Never  Vaping Use   Vaping Use: Never used  Substance and Sexual Activity   Alcohol use: Yes    Comment: occ   Drug use: Yes    Comment: prescribed oxycodone and oxycotin   Sexual activity: Not Currently  Other Topics Concern   Not on file  Social History Narrative   Not on file   Social Determinants of Health   Financial Resource Strain: Medium Risk (10/11/2019)   Overall Financial Resource Strain (CARDIA)    Difficulty of Paying Living Expenses: Somewhat hard  Food Insecurity: No Food Insecurity (04/01/2023)   Hunger Vital Sign    Worried About Running Out of Food in the Last Year: Never true    Ran Out of Food in the Last Year: Never true  Transportation Needs: No Transportation Needs (04/01/2023)   PRAPARE - Administrator, Civil Service (Medical): No    Lack of Transportation (Non-Medical): No  Physical Activity: Inactive (10/11/2019)   Exercise Vital Sign    Days of Exercise per Week: 0 days    Minutes of Exercise per Session: 0 min  Stress: Stress Concern Present (10/11/2019)   [employer] of Occupational Health - Occupational Stress Questionnaire    Feeling of Stress : Rather much  Social Connections: Unknown (10/11/2019)   Social Connection and  Isolation Panel [NHANES]    Frequency of Communication with Friends and Family: More than three times a week    Frequency of Social Gatherings with Friends and Family: More than three times a week    Attends Religious Services: Never    Database administrator or Organizations: No    Attends Engineer, structural: Not asked    Marital Status: Not on file  Intimate Partner Violence: Not At Risk (04/01/2023)   Humiliation, Afraid, Rape, and Kick questionnaire    Fear of Current or Ex-Partner: No    Emotionally Abused: No    Physically Abused: No    Sexually Abused: No    Review of Systems: See HPI, otherwise negative ROS  Physical Exam: BP 136/87   Pulse 71   Temp (!) 96.8 F (36 C) (Temporal)   Resp 18   Ht 4\' 9"  (1.448 m)   Wt 98 kg   LMP  (LMP Unknown) Comment: last menstrual 9 years ago, tubal ligation  SpO2 96%   BMI 46.74 kg/m  General:   Alert,  pleasant and cooperative in NAD Head:  Normocephalic and atraumatic. Neck:  Supple;  no masses or thyromegaly. Lungs:  Clear throughout to auscultation.    Heart:  Regular rate and rhythm. Abdomen:  Soft, nontender and nondistended. Normal bowel sounds, without guarding, and without rebound.   Neurologic:  Alert and  oriented x4;  grossly normal neurologically.  Impression/Plan: Brandi Bright is here for an upper endoscopy to be performed for esophageal varices  Risks, benefits, limitations, and alternatives regarding  endoscopy have been reviewed with the patient.  Questions have been answered.  All parties agreeable.   Lannette Donath, MD  05/17/2023, 10:21 AM

## 2023-05-18 ENCOUNTER — Encounter: Payer: Self-pay | Admitting: Gastroenterology

## 2023-05-21 DIAGNOSIS — S80212A Abrasion, left knee, initial encounter: Secondary | ICD-10-CM | POA: Diagnosis not present

## 2023-05-21 DIAGNOSIS — S8002XA Contusion of left knee, initial encounter: Secondary | ICD-10-CM | POA: Diagnosis not present

## 2023-05-21 DIAGNOSIS — M19072 Primary osteoarthritis, left ankle and foot: Secondary | ICD-10-CM | POA: Diagnosis not present

## 2023-05-26 ENCOUNTER — Ambulatory Visit: Payer: Medicaid Other | Admitting: Gastroenterology

## 2023-05-27 DIAGNOSIS — M542 Cervicalgia: Secondary | ICD-10-CM | POA: Diagnosis not present

## 2023-05-27 DIAGNOSIS — G894 Chronic pain syndrome: Secondary | ICD-10-CM | POA: Diagnosis not present

## 2023-05-27 DIAGNOSIS — Z79891 Long term (current) use of opiate analgesic: Secondary | ICD-10-CM | POA: Diagnosis not present

## 2023-05-27 DIAGNOSIS — M25569 Pain in unspecified knee: Secondary | ICD-10-CM | POA: Diagnosis not present

## 2023-05-28 DIAGNOSIS — S8002XA Contusion of left knee, initial encounter: Secondary | ICD-10-CM | POA: Diagnosis not present

## 2023-06-08 ENCOUNTER — Other Ambulatory Visit: Payer: Self-pay

## 2023-06-08 DIAGNOSIS — I8510 Secondary esophageal varices without bleeding: Secondary | ICD-10-CM

## 2023-06-08 MED ORDER — OMEPRAZOLE 40 MG PO CPDR: 40.0000 mg | DELAYED_RELEASE_CAPSULE | Freq: Every day | ORAL | 0 refills | Status: DC

## 2023-06-11 DIAGNOSIS — S8002XA Contusion of left knee, initial encounter: Secondary | ICD-10-CM | POA: Diagnosis not present

## 2023-06-16 ENCOUNTER — Ambulatory Visit: Payer: Medicaid Other | Admitting: Dermatology

## 2023-06-16 VITALS — BP 138/84

## 2023-06-16 DIAGNOSIS — L814 Other melanin hyperpigmentation: Secondary | ICD-10-CM

## 2023-06-16 DIAGNOSIS — L729 Follicular cyst of the skin and subcutaneous tissue, unspecified: Secondary | ICD-10-CM

## 2023-06-16 DIAGNOSIS — L720 Epidermal cyst: Secondary | ICD-10-CM | POA: Diagnosis not present

## 2023-06-16 DIAGNOSIS — Z808 Family history of malignant neoplasm of other organs or systems: Secondary | ICD-10-CM

## 2023-06-16 NOTE — Patient Instructions (Addendum)
 Pre-Operative Instructions  You are scheduled for a surgical procedure at [facility]. We recommend you read the following instructions. If you have any questions or concerns, please call the office at [phone].  Shower and wash the entire body with soap and water the day of your surgery paying special attention to cleansing at and around the planned surgery site.  Avoid aspirin or aspirin containing products at least fourteen (14) days prior to your surgical procedure and for at least one week (7 Days) after your surgical procedure. If you take aspirin on a regular basis for heart disease or history of stroke or for any other reason, we may recommend you continue taking aspirin but please notify us if you take this on a regular basis. Aspirin can cause more bleeding to occur during surgery as well as prolonged bleeding and bruising after surgery.   Avoid other nonsteroidal pain medications at least one week prior to surgery and at least one week prior to your surgery. These include medications such as Ibuprofen (Motrin, Advil and Nuprin), Naprosyn, Voltaren, Relafen, etc. If medications are used for therapeutic reasons, please inform us as they can cause increased bleeding or prolonged bleeding during and bruising after surgical procedures.   Please advise us if you are taking any "blood thinner" medications such as Coumadin or Dipyridamole or Plavix or similar medications. These cause increased bleeding and prolonged bleeding during procedures and bruising after surgical procedures. We may have to consider discontinuing these medications briefly prior to and shortly after your surgery if safe to do so.   Please inform us of all medications you are currently taking. All medications that are taken regularly should be taken the day of surgery as you always do. Nevertheless, we need to be informed of what medications you are taking prior to surgery to know whether they will affect the  procedure or cause any complications.   Please inform us of any medication allergies. Also inform us of whether you have allergies to Latex or rubber products or whether you have had any adverse reaction to Lidocaine or Epinephrine.  Please inform us of any prosthetic or artificial body parts such as artificial heart valve, joint replacements, etc., or similar condition that might require preoperative antibiotics.   We recommend avoidance of alcohol at least two weeks prior to surgery and continued avoidance for at least two weeks after surgery.   We recommend discontinuation of tobacco smoking at least two weeks prior to surgery and continued abstinence for at least two weeks after surgery.  Do not plan strenuous exercise, strenuous work or strenuous lifting for approximately four weeks after your surgery.   We request if you are unable to make your scheduled surgical appointment, please call us at least a week in advance or as soon as you are aware of a problem so that we can cancel or reschedule the appointment.   You MAY TAKE TYLENOL (acetaminophen) for pain as it is not a blood thinner.   PLEASE PLAN TO BE IN TOWN FOR TWO WEEKS FOLLOWING SURGERY, THIS IS IMPORTANT SO YOU CAN BE CHECKED FOR DRESSING CHANGES, SUTURE REMOVAL AND TO MONITOR FOR POSSIBLE COMPLICATIONS.    Due to recent changes in healthcare laws, you may see results of your pathology and/or laboratory studies on MyChart before the doctors have had a chance to review them. We understand that in some cases there may be results that are confusing or concerning to you. Please understand that not all results are   received at the same time and often the doctors may need to interpret multiple results in order to provide you with the best plan of care or course of treatment. Therefore, we ask that you please give us 2 business days to thoroughly review all your results before contacting the office for clarification. Should we see a  critical lab result, you will be contacted sooner.   If You Need Anything After Your Visit  If you have any questions or concerns for your doctor, please call our main line at [phone] and press option 4 to reach your doctor's medical assistant. If no one answers, please leave a voicemail as directed and we will return your call as soon as possible. Messages left after 4 pm will be answered the following business day.   You may also send us a message via MyChart. We typically respond to MyChart messages within 1-2 business days.  For prescription refills, please ask your pharmacy to contact our office. Our fax number is [phone].  If you have an urgent issue when the clinic is closed that cannot wait until the next business day, you can page your doctor at the number below.    Please note that while we do our best to be available for urgent issues outside of office hours, we are not available 24/7.   If you have an urgent issue and are unable to reach us, you may choose to seek medical care at your doctor's office, retail clinic, urgent care center, or emergency room.  If you have a medical emergency, please immediately call 911 or go to the emergency department.  Pager Numbers  - Dr. Kowalski: [phone]  - Dr. Moye: [phone]  - Dr. Stewart: [phone]  In the event of inclement weather, please call our main line at [phone] for an update on the status of any delays or closures.  Dermatology Medication Tips: Please keep the boxes that topical medications come in in order to help keep track of the instructions about where and how to use these. Pharmacies typically print the medication instructions only on the boxes and not directly on the medication tubes.   If your medication is too expensive, please contact our office at [phone] option 4 or send us a message through MyChart.   We are unable to tell what your co-pay for medications will be in advance as  this is different depending on your insurance coverage. However, we may be able to find a substitute medication at lower cost or fill out paperwork to get insurance to cover a needed medication.   If a prior authorization is required to get your medication covered by your insurance company, please allow us 1-2 business days to complete this process.  Drug prices often vary depending on where the prescription is filled and some pharmacies may offer cheaper prices.  The website www.goodrx.com contains coupons for medications through different pharmacies. The prices here do not account for what the cost may be with help from insurance (it may be cheaper with your insurance), but the website can give you the price if you did not use any insurance.  - You can print the associated coupon and take it with your prescription to the pharmacy.  - You may also stop by our office during regular business hours and pick up a GoodRx coupon card.  - If you need your prescription sent electronically to a different pharmacy, notify our office through [facility] MyChart or by phone at [phone] option 4.       Si Usted Necesita Algo Despus de Su Visita  Tambin puede enviarnos un mensaje a travs de MyChart. Por lo general respondemos a los mensajes de MyChart en el transcurso de 1 a 2 das hbiles.  Para renovar recetas, por favor pida a su farmacia que se ponga en contacto con nuestra oficina. Nuestro nmero de fax es el [phone].  Si tiene un asunto urgente cuando la clnica est cerrada y que no puede esperar hasta el siguiente da hbil, puede llamar/localizar a su doctor(a) al nmero que aparece a continuacin.   Por favor, tenga en cuenta que aunque hacemos todo lo posible para estar disponibles para asuntos urgentes fuera del horario de oficina, no estamos disponibles las 24 horas del da, los 7 das de la semana.   Si tiene un problema urgente y no puede comunicarse con nosotros, puede optar por  buscar atencin mdica  en el consultorio de su doctor(a), en una clnica privada, en un centro de atencin urgente o en una sala de emergencias.  Si tiene una emergencia mdica, por favor llame inmediatamente al 911 o vaya a la sala de emergencias.  Nmeros de bper  - Dr. Kowalski: [phone]  - Dra. Moye: [phone]  - Dra. Stewart: [phone]  En caso de inclemencias del tiempo, por favor llame a nuestra lnea principal al [phone] para una actualizacin sobre el estado de cualquier retraso o cierre.  Consejos para la medicacin en dermatologa: Por favor, guarde las cajas en las que vienen los medicamentos de uso tpico para ayudarle a seguir las instrucciones sobre dnde y cmo usarlos. Las farmacias generalmente imprimen las instrucciones del medicamento slo en las cajas y no directamente en los tubos del medicamento.   Si su medicamento es muy caro, por favor, pngase en contacto con nuestra oficina llamando al [phone] y presione la opcin 4 o envenos un mensaje a travs de MyChart.   No podemos decirle cul ser su copago por los medicamentos por adelantado ya que esto es diferente dependiendo de la cobertura de su seguro. Sin embargo, es posible que podamos encontrar un medicamento sustituto a menor costo o llenar un formulario para que el seguro cubra el medicamento que se considera necesario.   Si se requiere una autorizacin previa para que su compaa de seguros cubra su medicamento, por favor permtanos de 1 a 2 das hbiles para completar este proceso.  Los precios de los medicamentos varan con frecuencia dependiendo del lugar de dnde se surte la receta y alguna farmacias pueden ofrecer precios ms baratos.  El sitio web www.goodrx.com tiene cupones para medicamentos de diferentes farmacias. Los precios aqu no tienen en cuenta lo que podra costar con la ayuda del seguro (puede ser ms barato con su seguro), pero el sitio web puede darle el precio si no  utiliz ningn seguro.  - Puede imprimir el cupn correspondiente y llevarlo con su receta a la farmacia.  - Tambin puede pasar por nuestra oficina durante el horario de atencin regular y recoger una tarjeta de cupones de GoodRx.  - Si necesita que su receta se enve electrnicamente a una farmacia diferente, informe a nuestra oficina a travs de MyChart de [place] o por telfono llamando al [phone] y presione la opcin 4.  

## 2023-06-16 NOTE — Progress Notes (Signed)
   New Patient Visit   Subjective  Brandi Bright is a 52 y.o. female who presents for the following: growth R forehead, ~17 yrs, pt feels like may have gotten larger, no hx skin cancer, fhx of skin cancer with mother and sister not sure type.  New patient referral from Dr. Corky Downs.  Patient accompanied by daughter.  The following portions of the chart were reviewed this encounter and updated as appropriate: medications, allergies, medical history  Review of Systems:  No other skin or systemic complaints except as noted in HPI or Assessment and Plan.  Objective  Well appearing patient in no apparent distress; mood and affect are within normal limits.   A focused examination was performed of the following areas: face  Relevant exam findings are noted in the Assessment and Plan.       Assessment & Plan   FAMILY HISTORY OF SKIN CANCER What type(s):not sure type Who affected:mother, sister   EPIDERMAL INCLUSION CYST R forehead Exam: 2.0cm firm subcutaneous nodule at R forehead   Benign-appearing. Exam most consistent with an epidermal inclusion cyst. Discussed that a cyst is a benign growth that can grow over time and sometimes get irritated or inflamed. Recommend observation if it is not bothersome. Discussed option of surgical excision to remove it if it is growing, symptomatic, or other changes noted. Please call for new or changing lesions so they can be evaluated.  Cyst with symptoms and/or recent change.  Discussed surgical excision to remove, including resulting scar and possible recurrence.  Patient may schedule for surgery. Pre-op information given.   LENTIGINES Exam: scattered tan macules Due to sun exposure Treatment Plan: Benign-appearing, observe. Recommend daily broad spectrum sunscreen SPF 30+ to sun-exposed areas, reapply every 2 hours as needed.  Call for any changes     Return for pt may schedule for surgery with Dr. Verdell Face, Ardis Rowan, RMA, am  acting as scribe for Willeen Niece, MD .   Documentation: I have reviewed the above documentation for accuracy and completeness, and I agree with the above.  Willeen Niece, MD

## 2023-06-22 ENCOUNTER — Telehealth: Payer: Self-pay

## 2023-06-22 NOTE — Telephone Encounter (Signed)
Left pt message advising Dr. Gwen Pounds can do the surgery for Cyst vs other on the R forehead.  Pt has already scheduled surgery appt with Dr. Gwen Pounds.  Advised pt to call if any questions./sh

## 2023-06-22 NOTE — Telephone Encounter (Signed)
-----   Message from Deirdre Evener, MD sent at 06/22/2023  1:23 PM EDT ----- You can put on my surgery schedule or send to Dr Arita Miss. ----- Message ----- From: Mitzi Davenport, CMA Sent: 06/16/2023   9:50 AM EDT To: Deirdre Evener, MD  Patient saw Dr. Roseanne Reno 06/16/23. She has a cyst on R forehead, she is scheduling surgery with you per Dr. Roseanne Reno.  Can you view photo and make sure it is something you will do./Makinzy Cleere

## 2023-06-28 ENCOUNTER — Telehealth: Payer: Self-pay

## 2023-06-28 DIAGNOSIS — I8510 Secondary esophageal varices without bleeding: Secondary | ICD-10-CM | POA: Diagnosis not present

## 2023-06-28 DIAGNOSIS — K7460 Unspecified cirrhosis of liver: Secondary | ICD-10-CM | POA: Diagnosis not present

## 2023-06-28 NOTE — Telephone Encounter (Signed)
Patient left a voicemail to see if the lab was open today and what time they were open till. Return patient call and informed patient they were open till 4:30 today.

## 2023-06-29 ENCOUNTER — Telehealth: Payer: Self-pay

## 2023-06-29 LAB — CBC WITH DIFFERENTIAL
Basophils Absolute: 0 10*3/uL (ref 0.0–0.2)
Basos: 1 %
EOS (ABSOLUTE): 0.1 10*3/uL (ref 0.0–0.4)
Eos: 3 %
Hematocrit: 30.7 % — ABNORMAL LOW (ref 34.0–46.6)
Hemoglobin: 10.3 g/dL — ABNORMAL LOW (ref 11.1–15.9)
Immature Grans (Abs): 0 10*3/uL (ref 0.0–0.1)
Immature Granulocytes: 0 %
Lymphocytes Absolute: 0.5 10*3/uL — ABNORMAL LOW (ref 0.7–3.1)
Lymphs: 35 %
MCH: 29.2 pg (ref 26.6–33.0)
MCHC: 33.6 g/dL (ref 31.5–35.7)
MCV: 87 fL (ref 79–97)
Monocytes Absolute: 0.1 10*3/uL (ref 0.1–0.9)
Monocytes: 7 %
Neutrophils Absolute: 0.8 10*3/uL — ABNORMAL LOW (ref 1.4–7.0)
Neutrophils: 54 %
RBC: 3.53 x10E6/uL — ABNORMAL LOW (ref 3.77–5.28)
RDW: 12.5 % (ref 11.7–15.4)
WBC: 1.5 10*3/uL — CL (ref 3.4–10.8)

## 2023-06-29 NOTE — Telephone Encounter (Signed)
Patient had CBC done yesterday can you please review results

## 2023-06-29 NOTE — Telephone Encounter (Signed)
1. WCC is low , platelet count not reported- can we find out- hb 10 grams- does she have any symptoms , does she see hematology ?

## 2023-06-29 NOTE — Telephone Encounter (Signed)
Dr Cathie Hoops , appears has pancytopenia - is there anything for you to do - I believe used to see you previously   F/u with Dr Allegra Lai when she returns

## 2023-06-29 NOTE — Telephone Encounter (Signed)
Patient states she has been having some rectal bleeding when she strains to have a bowel movement but that is not often. She states she has a hemorrhoid. She does get a headache sometimes but takes a tylenol and it will go away. Has been having fatigue. Denies any dizziness. She states she does see a hematologist Dr. Cathie Hoops and asked patient if she gets iron infusions and states she has not got one in 4 to 5 months. She states she notices her RBC and hemoglobin is lower then it is normally is. She states that she is taking the omeprazole 30 minutes before breakfast and can eat breakfast fine but when she tries to eat lunch and dinner she has a horrible pain at her breast bone. She will start coughing and spitting till she vomits a big ball of salvia she states.

## 2023-06-30 NOTE — Telephone Encounter (Signed)
Patient has appointment with you for a follow up in December does she need to see you sooner. Has EGD scheduled for 07/21/23

## 2023-07-04 NOTE — Telephone Encounter (Signed)
Before I do EGD as scheduled on 8/7, I would like to see her platelets > 50 Dr Cathie Hoops, would you be able to arrange for doptelet ?  Got the reference regarding dosing from uptodate Begin avatrombopag 10 to 13 days prior to the scheduled procedure. Patients should undergo procedure 5 to 8 days after the last avatrombopag dose. Obtain a platelet count prior to therapy administration and on the day of the procedure (to ensure adequate increase in platelet count). Platelet count 40,000 to <50,000/mm3: 40 mg once daily for 5 consecutive days Platelet count <40,000/mm3: 60 mg once daily for 5 consecutive days   We can repeat her CBC along with iron panel, she may also need iron infusion  Thank you  Floris Neuhaus

## 2023-07-05 NOTE — Telephone Encounter (Signed)
Dr. Cathie Hoops and GI have discussed pt and would like for her to see Dr. Cathie Hoops sooner. Please move up appts in Aug to this week (end of the week looks better). Please inform pt of appt details.

## 2023-07-05 NOTE — Telephone Encounter (Signed)
Canceled the EGD with Patient and called endo and canceled procedure EGD with Trish.

## 2023-07-06 ENCOUNTER — Encounter: Payer: Medicaid Other | Admitting: Dermatology

## 2023-07-09 ENCOUNTER — Inpatient Hospital Stay (HOSPITAL_BASED_OUTPATIENT_CLINIC_OR_DEPARTMENT_OTHER): Payer: Medicaid Other | Admitting: Oncology

## 2023-07-09 ENCOUNTER — Inpatient Hospital Stay: Payer: Medicaid Other | Attending: Oncology

## 2023-07-09 ENCOUNTER — Other Ambulatory Visit: Payer: Self-pay | Admitting: Oncology

## 2023-07-09 ENCOUNTER — Encounter: Payer: Self-pay | Admitting: Oncology

## 2023-07-09 VITALS — BP 141/77 | HR 72 | Temp 98.4°F | Resp 18 | Wt 210.9 lb

## 2023-07-09 DIAGNOSIS — K7469 Other cirrhosis of liver: Secondary | ICD-10-CM | POA: Insufficient documentation

## 2023-07-09 DIAGNOSIS — D500 Iron deficiency anemia secondary to blood loss (chronic): Secondary | ICD-10-CM

## 2023-07-09 DIAGNOSIS — D689 Coagulation defect, unspecified: Secondary | ICD-10-CM

## 2023-07-09 DIAGNOSIS — D709 Neutropenia, unspecified: Secondary | ICD-10-CM | POA: Diagnosis not present

## 2023-07-09 DIAGNOSIS — K7460 Unspecified cirrhosis of liver: Secondary | ICD-10-CM

## 2023-07-09 DIAGNOSIS — E538 Deficiency of other specified B group vitamins: Secondary | ICD-10-CM | POA: Insufficient documentation

## 2023-07-09 DIAGNOSIS — Z801 Family history of malignant neoplasm of trachea, bronchus and lung: Secondary | ICD-10-CM | POA: Insufficient documentation

## 2023-07-09 DIAGNOSIS — D696 Thrombocytopenia, unspecified: Secondary | ICD-10-CM | POA: Diagnosis not present

## 2023-07-09 DIAGNOSIS — D509 Iron deficiency anemia, unspecified: Secondary | ICD-10-CM | POA: Diagnosis not present

## 2023-07-09 DIAGNOSIS — Z800 Family history of malignant neoplasm of digestive organs: Secondary | ICD-10-CM | POA: Insufficient documentation

## 2023-07-09 DIAGNOSIS — R161 Splenomegaly, not elsewhere classified: Secondary | ICD-10-CM | POA: Diagnosis not present

## 2023-07-09 LAB — CMP (CANCER CENTER ONLY)
ALT: 20 U/L (ref 0–44)
AST: 33 U/L (ref 15–41)
Albumin: 3.3 g/dL — ABNORMAL LOW (ref 3.5–5.0)
Alkaline Phosphatase: 110 U/L (ref 38–126)
Anion gap: 5 (ref 5–15)
BUN: 11 mg/dL (ref 6–20)
CO2: 25 mmol/L (ref 22–32)
Calcium: 8.4 mg/dL — ABNORMAL LOW (ref 8.9–10.3)
Chloride: 109 mmol/L (ref 98–111)
Creatinine: 0.56 mg/dL (ref 0.44–1.00)
GFR, Estimated: >60 mL/min (ref >60)
Glucose, Bld: 112 mg/dL — ABNORMAL HIGH (ref 70–99)
Potassium: 3.5 mmol/L (ref 3.5–5.1)
Sodium: 139 mmol/L (ref 135–145)
Total Bilirubin: 0.8 mg/dL (ref 0.3–1.2)
Total Protein: 6.4 g/dL — ABNORMAL LOW (ref 6.5–8.1)

## 2023-07-09 LAB — CBC WITH DIFFERENTIAL (CANCER CENTER ONLY)
Abs Immature Granulocytes: 0 10*3/uL (ref 0.00–0.07)
Basophils Absolute: 0 10*3/uL (ref 0.0–0.1)
Basophils Relative: 1 %
Eosinophils Absolute: 0 10*3/uL (ref 0.0–0.5)
Eosinophils Relative: 3 %
HCT: 31.8 % — ABNORMAL LOW (ref 36.0–46.0)
Hemoglobin: 10.3 g/dL — ABNORMAL LOW (ref 12.0–15.0)
Immature Granulocytes: 0 %
Lymphocytes Relative: 34 %
Lymphs Abs: 0.5 10*3/uL — ABNORMAL LOW (ref 0.7–4.0)
MCH: 28.1 pg (ref 26.0–34.0)
MCHC: 32.4 g/dL (ref 30.0–36.0)
MCV: 86.9 fL (ref 80.0–100.0)
Monocytes Absolute: 0.1 10*3/uL (ref 0.1–1.0)
Monocytes Relative: 6 %
Neutro Abs: 0.8 10*3/uL — ABNORMAL LOW (ref 1.7–7.7)
Neutrophils Relative %: 56 %
Platelet Count: 44 10*3/uL — ABNORMAL LOW (ref 150–400)
RBC: 3.66 MIL/uL — ABNORMAL LOW (ref 3.87–5.11)
RDW: 12.1 % (ref 11.5–15.5)
WBC Count: 1.3 10*3/uL — ABNORMAL LOW (ref 4.0–10.5)
nRBC: 0 % (ref 0.0–0.2)

## 2023-07-09 LAB — PROTIME-INR
INR: 1.4 — ABNORMAL HIGH (ref 0.8–1.2)
Prothrombin Time: 17.1 seconds — ABNORMAL HIGH (ref 11.4–15.2)

## 2023-07-09 LAB — IRON AND TIBC
Iron: 42 ug/dL (ref 28–170)
Saturation Ratios: 9 % — ABNORMAL LOW (ref 10.4–31.8)
TIBC: 451 ug/dL — ABNORMAL HIGH (ref 250–450)
UIBC: 409 ug/dL

## 2023-07-09 LAB — VITAMIN B12: Vitamin B-12: 286 pg/mL (ref 180–914)

## 2023-07-09 LAB — APTT: aPTT: 37 seconds — ABNORMAL HIGH (ref 24–36)

## 2023-07-09 LAB — LACTATE DEHYDROGENASE: LDH: 141 U/L (ref 98–192)

## 2023-07-09 LAB — FOLATE: Folate: 11.7 ng/mL (ref >5.9)

## 2023-07-09 LAB — FERRITIN: Ferritin: 10 ng/mL — ABNORMAL LOW (ref 11–307)

## 2023-07-09 NOTE — Assessment & Plan Note (Signed)
Follow up with GI.  EGD can not be done due to neutropenia

## 2023-07-09 NOTE — Assessment & Plan Note (Signed)
Due to liver cirrhosis. Stable.

## 2023-07-09 NOTE — Assessment & Plan Note (Signed)
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 Recommend checking CBC prior to future elective procedures.  Recommend Platelet transfusion or TPO [Avatrombopag or Lusutrombopag] if platelet is <50,000

## 2023-07-09 NOTE — Progress Notes (Addendum)
Hematology/Oncology Progress note [phone] [phone]    Chief Complaint: Brandi Bright is a 52 y.o. female follows up for management of iron deficiency anemia, thrombocytopenia, and leukopenia   ASSESSMENT & PLAN:   Thrombocytopenia (HCC) # Chronic neutropenia and thrombocytopenia Previous Bone marrow biopsy work-up was negative  Low counts are due to chronic cirrhosis/splenomegaly. Recommend checking CBC prior to future elective procedures.  Recommend Platelet transfusion or TPO [Avatrombopag or Lusutrombopag] if platelet is <50,000   Other cirrhosis of liver (HCC) Follow up with GI.  EGD can not be done due to neutropenia  Iron deficiency anemia Labs are consistent with iron deficiency anemia.  Recommend IV venofer weekly x 4   Neutropenia (HCC) Recommend neutropenia precaution.  Possible etiologies include vitamin deficiency,chronic liver disease, bone marrow disorders. Etc.  Check B12, folate, copper, flowcytometry. ANA   Available results were reviewed at the time of dictation.  B12 level is borderline, which may contribute to neutropenia. recommend patient to start B12 injection.   Coagulopathy (HCC) Due to liver cirrhosis. Stable.   B12 deficiency Recommend B12 injections weekly x 4    No orders of the defined types were placed in this encounter.  Follow up  TBD All questions were answered. The patient knows to call the clinic with any problems, questions or concerns.  Rickard Patience, MD, PhD [facility] Hematology Oncology 07/09/2023    PERTINENT HEMATOLOGY HISTORY Patient follows up with Dr. Merlene Pulling previously.  Establish care with me on 02/09/2019. Reviewed patient's previous medical records, labs, imaging results. # History of cirrhosis and hepatitis C.  She has a history of chronic anemia, thrombocytopenia and leukopenia dating back to 03/2012.  Platelet count has fluctuated between 54,000 - 56,000 since 12/28/2017 (previously 73,000 -  134,000).  Ferritin was 14 on 08/31/2018.    Work-up on 09/23/2018 revealed a hematocrit of 31.0, hemoglobin 10.1, MCV 87.6, platelets 46,000, WBC 1900 with an ANC of 1100.  Ferritin was 10 (low) with iron saturation 16% with a TIBC of 401.  Retic was 1.2%.  B12 was 315.  Normal studies included: folate, SPEP, and free light chain ratio.  Copper was 69 (72-166).  ANA was + with double stranded DNA antibody 13 (0-9).  TSH was 0.036 (0.35-4.5) with a free T4 1.05 (0.82-1.77).  Peripheral smear revealed variant lymphocytes.  Ferritin has been followed:  14 on 08/31/2018 and 10 on 09/23/2018.  Abdomen and pelvic CT on 07/14/2018 revealed cirrhosis with portal hypertension noted by prominent splenomegaly (18.5 x 13.8 x 8.1 cm; volume 1100 cm3), enlarged portal veins with recannulated umbilical vein, paraesophageal and perigastric varices.  There was mild thickening of the cecum and ascending colon.  EGD on 10/27/2018 revealed grade II esophageal varices.  There was erythematous mucosa in the antrum.  There was congested, erythematous, friable (with contact bleeding), granular and nodular mucosa in the gastric fundus and astric body.  There was a single gastric polyp (polypoid ulcerated antral type mucos with chronic active mucosal inflammation).  There was a normal duodenal bulb, second portion of the duodenum and examined duodenum.  The patient's iron deficiency could be explained by her friable gastric mucosa (likely from portal hypertension).  There was no history of variceal bleeding and thus this is not a cause of her iron deficiency.  # admitted from 10/11/2019 to 10/12/2019 due to rectal bleeding EGD 10/12/2019 showed portal hypertensive gastropathy, treated with APC.  Nonbleeding large esophageal varices incompletely evaluated.  Banded. Patient follows with gastroenterology and had EGD  on 11/20/2019. Banding was not done due to large amount of food in the stomach. There was plan for EGD on 11/23/2019.   Her gastroenterologist Dr. Maximino Greenland contacted me to see if patient can get platelet transfusion to improve her platelet counts for banding on 11/23/2019.     INTERVAL HISTORY RYELEE Bright is a 52 y.o. female who has above history reviewed by me today presents for follow up visit for management of thrombocytopenia, splenomegaly, leukopenia and anemia. She feels well today. + easy bruising + intermittent rectal bleeding.  She re-established care with GI. Was recommend EGD, however procedure was held due to neutropenia.  Denies weight loss, fever, chills, fatigue, night sweats.    Review of Systems  Constitutional:  Positive for fatigue. Negative for appetite change, chills and fever.  HENT:   Negative for hearing loss and voice change.   Eyes:  Negative for eye problems.  Respiratory:  Negative for chest tightness and cough.   Cardiovascular:  Negative for chest pain.  Gastrointestinal:  Positive for blood in stool. Negative for abdominal distention and abdominal pain.  Endocrine: Negative for hot flashes.  Genitourinary:  Negative for difficulty urinating and frequency.   Musculoskeletal:  Negative for arthralgias.  Skin:  Negative for itching and rash.  Neurological:  Negative for extremity weakness.  Hematological:  Negative for adenopathy. Bruises/bleeds easily.  Psychiatric/Behavioral:  Negative for confusion.    Past Medical History:  Diagnosis Date   Arthritis    Back pain    Chronic knee pain    Cirrhosis (HCC)    Gout    HLD (hyperlipidemia)    Hypothyroidism    IDA (iron deficiency anemia) 01/16/2020   MI, old    Thyroid disease     Past Surgical History:  Procedure Laterality Date   CARDIAC CATHETERIZATION     CESAREAN SECTION     COLONOSCOPY WITH PROPOFOL N/A 03/16/2018   Procedure: COLONOSCOPY WITH PROPOFOL;  Surgeon: Pasty Spillers, MD;  Location: [facility] ENDOSCOPY;  Service: Endoscopy;  Laterality: N/A;   COLONOSCOPY WITH PROPOFOL N/A 07/09/2021    Procedure: COLONOSCOPY WITH PROPOFOL;  Surgeon: Pasty Spillers, MD;  Location: [facility] ENDOSCOPY;  Service: Endoscopy;  Laterality: N/A;   ESOPHAGOGASTRODUODENOSCOPY N/A 10/11/2019   Procedure: ESOPHAGOGASTRODUODENOSCOPY (EGD);  Surgeon: Toney Reil, MD;  Location: [facility] ENDOSCOPY;  Service: Gastroenterology;  Laterality: N/A;   ESOPHAGOGASTRODUODENOSCOPY (EGD) WITH PROPOFOL N/A 10/27/2018   Procedure: ESOPHAGOGASTRODUODENOSCOPY (EGD) WITH PROPOFOL;  Surgeon: Pasty Spillers, MD;  Location: [facility] ENDOSCOPY;  Service: Endoscopy;  Laterality: N/A;   ESOPHAGOGASTRODUODENOSCOPY (EGD) WITH PROPOFOL N/A 06/25/2019   Procedure: ESOPHAGOGASTRODUODENOSCOPY (EGD) WITH PROPOFOL;  Surgeon: Wyline Mood, MD;  Location: [facility] ENDOSCOPY;  Service: Gastroenterology;  Laterality: N/A;   ESOPHAGOGASTRODUODENOSCOPY (EGD) WITH PROPOFOL N/A 09/12/2019   Procedure: ESOPHAGOGASTRODUODENOSCOPY (EGD) WITH PROPOFOL;  Surgeon: Midge Minium, MD;  Location: [facility] ENDOSCOPY;  Service: Endoscopy;  Laterality: N/A;   ESOPHAGOGASTRODUODENOSCOPY (EGD) WITH PROPOFOL N/A 11/20/2019   Procedure: ESOPHAGOGASTRODUODENOSCOPY (EGD) WITH PROPOFOL;  Surgeon: Pasty Spillers, MD;  Location: [facility] ENDOSCOPY;  Service: Endoscopy;  Laterality: N/A;   ESOPHAGOGASTRODUODENOSCOPY (EGD) WITH PROPOFOL N/A 11/23/2019   Procedure: ESOPHAGOGASTRODUODENOSCOPY (EGD) WITH PROPOFOL;  Surgeon: Pasty Spillers, MD;  Location: [facility] ENDOSCOPY;  Service: Endoscopy;  Laterality: N/A;   ESOPHAGOGASTRODUODENOSCOPY (EGD) WITH PROPOFOL N/A 01/03/2020   Procedure: ESOPHAGOGASTRODUODENOSCOPY (EGD) WITH PROPOFOL;  Surgeon: Toney Reil, MD;  Location: [facility] ENDOSCOPY;  Service: Endoscopy;  Laterality: N/A;   ESOPHAGOGASTRODUODENOSCOPY (EGD) WITH PROPOFOL N/A 07/09/2021   Procedure:  ESOPHAGOGASTRODUODENOSCOPY (EGD) WITH PROPOFOL;  Surgeon: Pasty Spillers, MD;  Location: [facility] ENDOSCOPY;  Service: Endoscopy;  Laterality: N/A;    ESOPHAGOGASTRODUODENOSCOPY (EGD) WITH PROPOFOL N/A 05/17/2023   Procedure: ESOPHAGOGASTRODUODENOSCOPY (EGD) WITH PROPOFOL;  Surgeon: Toney Reil, MD;  Location: [facility] ENDOSCOPY;  Service: Gastroenterology;  Laterality: N/A;   TUBAL LIGATION      Family History  Problem Relation Age of Onset   Hypertension Other    CAD Father    Colon cancer Father    Lung cancer Father    Pancreatic cancer Brother    Social History   Socioeconomic History   Marital status: Single    Spouse name: Not on file   Number of children: Not on file   Years of education: Not on file   Highest education level: Not on file  Occupational History   Not on file  Tobacco Use   Smoking status: Never   Smokeless tobacco: Never  Vaping Use   Vaping status: Never Used  Substance and Sexual Activity   Alcohol use: Yes    Comment: occ   Drug use: Yes    Comment: prescribed oxycodone and oxycotin   Sexual activity: Not Currently  Other Topics Concern   Not on file  Social History Narrative   Not on file   Social Determinants of Health   Financial Resource Strain: Medium Risk (10/11/2019)   Overall Financial Resource Strain (CARDIA)    Difficulty of Paying Living Expenses: Somewhat hard  Food Insecurity: No Food Insecurity (04/01/2023)   Hunger Vital Sign    Worried About Running Out of Food in the Last Year: Never true    Ran Out of Food in the Last Year: Never true  Transportation Needs: No Transportation Needs (04/01/2023)   PRAPARE - Administrator, Civil Service (Medical): No    Lack of Transportation (Non-Medical): No  Physical Activity: Inactive (10/11/2019)   Exercise Vital Sign    Days of Exercise per Week: 0 days    Minutes of Exercise per Session: 0 min  Stress: Stress Concern Present (10/11/2019)   [employer] of Occupational Health - Occupational Stress Questionnaire    Feeling of Stress : Rather much  Social Connections: Unknown (10/11/2019)   Social Connection  and Isolation Panel [NHANES]    Frequency of Communication with Friends and Family: More than three times a week    Frequency of Social Gatherings with Friends and Family: More than three times a week    Attends Religious Services: Never    Database administrator or Organizations: No    Attends Banker Meetings: Not asked    Marital Status: Not on file  Intimate Partner Violence: Not At Risk (04/01/2023)   Humiliation, Afraid, Rape, and Kick questionnaire    Fear of Current or Ex-Partner: No    Emotionally Abused: No    Physically Abused: No    Sexually Abused: No    Allergies:  Allergies  Allergen Reactions   Vicodin [Hydrocodone-Acetaminophen] Rash   Amoxicillin Rash and Other (See Comments)    Has patient had a PCN reaction causing immediate rash, facial/tongue/throat swelling, SOB or lightheadedness with hypotension: Yes Has patient had a PCN reaction causing severe rash involving mucus membranes or skin necrosis: No Has patient had a PCN reaction that required hospitalization: No Has patient had a PCN reaction occurring within the last 10 years: No If all of the above answers are "NO", then may proceed with Cephalosporin use.  Gabapentin Rash    Current Medications: Current Outpatient Medications  Medication Sig Dispense Refill   aspirin EC 81 MG tablet Take 1 tablet (81 mg total) by mouth daily. Swallow whole. 360 tablet 0   levothyroxine (SYNTHROID) 150 MCG tablet Take 1 tablet p.o. daily 30 tablet 6   nitroGLYCERIN (NITROLINGUAL) 0.4 MG/SPRAY spray Place 1 spray under the tongue every 5 (five) minutes x 3 doses as needed for chest pain. 12 g 2   omeprazole (PRILOSEC) 40 MG capsule Take 1 capsule (40 mg total) by mouth daily. 90 capsule 0   oxyCODONE (OXYCONTIN) 15 mg 12 hr tablet 15 mg every 12 (twelve) hours.     Oxycodone HCl 10 MG TABS Take 10 mg by mouth 4 (four) times daily as needed.     rosuvastatin (CRESTOR) 5 MG tablet Take 5 mg by mouth daily.      ZTLIDO 1.8 % PTCH Apply 1 patch topically at bedtime.     No current facility-administered medications for this visit.     Physical Exam: Blood pressure (!) 141/77, pulse 72, temperature 98.4 F (36.9 C), temperature source Tympanic, resp. rate 18, weight 210 lb 14.4 oz (95.7 kg), SpO2 100%.   Physical Exam Constitutional:      General: She is not in acute distress.    Appearance: She is not diaphoretic.  HENT:     Head: Normocephalic and atraumatic.     Nose: Nose normal.     Mouth/Throat:     Pharynx: No oropharyngeal exudate.  Eyes:     General: No scleral icterus.    Pupils: Pupils are equal, round, and reactive to light.  Cardiovascular:     Rate and Rhythm: Normal rate and regular rhythm.     Heart sounds: No murmur heard. Pulmonary:     Effort: Pulmonary effort is normal. No respiratory distress.     Breath sounds: Normal breath sounds.  Abdominal:     General: Bowel sounds are normal.     Palpations: Abdomen is soft.     Comments: Splenomegaly  Musculoskeletal:        General: Normal range of motion.     Cervical back: Normal range of motion and neck supple.  Skin:    General: Skin is warm and dry.     Findings: No erythema.  Neurological:     Mental Status: She is alert and oriented to person, place, and time.     Cranial Nerves: No cranial nerve deficit.     Motor: No abnormal muscle tone.     Coordination: Coordination normal.  Psychiatric:        Mood and Affect: Affect normal.    Labs    Latest Ref Rng & Units 07/09/2023   10:29 AM 06/28/2023    3:02 PM 03/31/2023    9:25 PM  CBC  WBC 4.0 - 10.5 K/uL 1.3  1.5  2.5   Hemoglobin 12.0 - 15.0 g/dL 16.1  09.6  04.5   Hematocrit 36.0 - 46.0 % 31.8  30.7  39.8   Platelets 150 - 400 K/uL 44   39       Latest Ref Rng & Units 07/09/2023   10:29 AM 03/31/2023    9:25 PM 02/18/2023    2:16 AM  CMP  Glucose 70 - 99 mg/dL 409  811  914   BUN 6 - 20 mg/dL 11  9  8    Creatinine 0.44 - 1.00 mg/dL 7.82  9.56   2.13  Sodium 135 - 145 mmol/L 139  141  139   Potassium 3.5 - 5.1 mmol/L 3.5  3.6  3.7   Chloride 98 - 111 mmol/L 109  112  108   CO2 22 - 32 mmol/L 25  25  24    Calcium 8.9 - 10.3 mg/dL 8.4  8.8  8.8   Total Protein 6.5 - 8.1 g/dL 6.4  6.5    Total Bilirubin 0.3 - 1.2 mg/dL 0.8  1.2    Alkaline Phos 38 - 126 U/L 110  113    AST 15 - 41 U/L 33  47    ALT 0 - 44 U/L 20  24     Lab Results  Component Value Date   IRON 42 07/09/2023   TIBC 451 (H) 07/09/2023   FERRITIN 10 (L) 07/09/2023     RADIOGRAPHIC STUDIES: I have personally reviewed the radiological images as listed and agreed with the findings in the report. US ABDOMEN LIMITED RUQ (LIVER/GB)  Result Date: 05/14/2023 CLINICAL DATA:  Cirrhosis EXAM: ULTRASOUND ABDOMEN LIMITED RIGHT UPPER QUADRANT COMPARISON:  Ultrasound abdomen 08/03/2022 FINDINGS: Gallbladder: No gallstones or wall thickening visualized. No sonographic Murphy sign noted by sonographer. Common bile duct: Diameter: 3.1 mm Liver: Coarsened echogenicity and nodular contour. No focal lesion. Portal vein is patent on color Doppler imaging with normal direction of blood flow towards the liver. Recanalization of the umbilical vein. Multiple varicosities within the right upper quadrant. Other: None. IMPRESSION: 1. Cirrhotic morphology of the liver. No focal lesion identified. 2. Recanalization of the umbilical vein. Multiple varicosities within the right upper quadrant, sequelae of portal venous hypertension. Electronically Signed   By: Annia Belt M.D.   On: 05/14/2023 09:45

## 2023-07-09 NOTE — Assessment & Plan Note (Addendum)
Recommend neutropenia precaution.  Possible etiologies include vitamin deficiency,chronic liver disease, bone marrow disorders. Etc.  Check B12, folate, copper, flowcytometry. ANA   Available results were reviewed at the time of dictation.  B12 level is borderline, which may contribute to neutropenia. recommend patient to start B12 injection.

## 2023-07-09 NOTE — Assessment & Plan Note (Signed)
Labs are consistent with iron deficiency anemia.  Recommend IV venofer weekly x 4

## 2023-07-11 ENCOUNTER — Encounter: Payer: Self-pay | Admitting: Oncology

## 2023-07-20 ENCOUNTER — Encounter: Payer: Self-pay | Admitting: Oncology

## 2023-07-20 DIAGNOSIS — E538 Deficiency of other specified B group vitamins: Secondary | ICD-10-CM

## 2023-07-20 HISTORY — DX: Deficiency of other specified B group vitamins: E53.8

## 2023-07-20 NOTE — Assessment & Plan Note (Signed)
Recommend B12 injections weekly x 4

## 2023-07-20 NOTE — Addendum Note (Signed)
Addended by: Rickard Patience on: 07/20/2023 11:53 AM   Modules accepted: Orders

## 2023-07-21 ENCOUNTER — Ambulatory Visit: Admit: 2023-07-21 | Payer: Medicaid Other | Admitting: Gastroenterology

## 2023-07-21 ENCOUNTER — Other Ambulatory Visit: Payer: Self-pay

## 2023-07-21 DIAGNOSIS — D500 Iron deficiency anemia secondary to blood loss (chronic): Secondary | ICD-10-CM

## 2023-07-21 SURGERY — ESOPHAGOGASTRODUODENOSCOPY (EGD) WITH PROPOFOL
Anesthesia: General

## 2023-07-26 ENCOUNTER — Other Ambulatory Visit: Payer: Self-pay | Admitting: Gastroenterology

## 2023-07-27 ENCOUNTER — Encounter: Payer: Medicaid Other | Admitting: Dermatology

## 2023-07-29 ENCOUNTER — Inpatient Hospital Stay: Payer: Medicaid Other | Attending: Oncology

## 2023-07-29 VITALS — BP 130/77 | HR 72 | Temp 98.2°F | Resp 16

## 2023-07-29 DIAGNOSIS — Z79891 Long term (current) use of opiate analgesic: Secondary | ICD-10-CM | POA: Diagnosis not present

## 2023-07-29 DIAGNOSIS — D500 Iron deficiency anemia secondary to blood loss (chronic): Secondary | ICD-10-CM

## 2023-07-29 DIAGNOSIS — D509 Iron deficiency anemia, unspecified: Secondary | ICD-10-CM | POA: Insufficient documentation

## 2023-07-29 DIAGNOSIS — E538 Deficiency of other specified B group vitamins: Secondary | ICD-10-CM | POA: Diagnosis not present

## 2023-07-29 DIAGNOSIS — M25569 Pain in unspecified knee: Secondary | ICD-10-CM | POA: Diagnosis not present

## 2023-07-29 DIAGNOSIS — G894 Chronic pain syndrome: Secondary | ICD-10-CM | POA: Diagnosis not present

## 2023-07-29 DIAGNOSIS — M542 Cervicalgia: Secondary | ICD-10-CM | POA: Diagnosis not present

## 2023-07-29 MED ORDER — SODIUM CHLORIDE 0.9 % IV SOLN
INTRAVENOUS | Status: DC
  Filled 2023-07-29: qty 250

## 2023-07-29 MED ORDER — SODIUM CHLORIDE 0.9 % IV SOLN
200.0000 mg | Freq: Once | INTRAVENOUS | Status: AC
  Administered 2023-07-29: 200 mg via INTRAVENOUS
  Filled 2023-07-29: qty 200

## 2023-07-29 MED ORDER — CYANOCOBALAMIN 1000 MCG/ML IJ SOLN
1000.0000 ug | Freq: Once | INTRAMUSCULAR | Status: AC
  Administered 2023-07-29: 1000 ug via INTRAMUSCULAR
  Filled 2023-07-29: qty 1

## 2023-07-29 NOTE — Patient Instructions (Signed)
 Iron Sucrose Injection What is this medication? IRON SUCROSE (EYE ern SOO krose) treats low levels of iron (iron deficiency anemia) in people with kidney disease. Iron is a mineral that plays an important role in making red blood cells, which carry oxygen from your lungs to the rest of your body. This medicine may be used for other purposes; ask your health care provider or pharmacist if you have questions. COMMON BRAND NAME(S): Venofer What should I tell my care team before I take this medication? They need to know if you have any of these conditions: Anemia not caused by low iron levels Heart disease High levels of iron in the blood Kidney disease Liver disease An unusual or allergic reaction to iron, other medications, foods, dyes, or preservatives Pregnant or trying to get pregnant Breastfeeding How should I use this medication? This medication is for infusion into a vein. It is given in a hospital or clinic setting. Talk to your care team about the use of this medication in children. While this medication may be prescribed for children as young as 2 years for selected conditions, precautions do apply. Overdosage: If you think you have taken too much of this medicine contact a poison control center or emergency room at once. NOTE: This medicine is only for you. Do not share this medicine with others. What if I miss a dose? Keep appointments for follow-up doses. It is important not to miss your dose. Call your care team if you are unable to keep an appointment. What may interact with this medication? Do not take this medication with any of the following: Deferoxamine Dimercaprol Other iron products This medication may also interact with the following: Chloramphenicol Deferasirox This list may not describe all possible interactions. Give your health care provider a list of all the medicines, herbs, non-prescription drugs, or dietary supplements you use. Also tell them if you smoke,  drink alcohol, or use illegal drugs. Some items may interact with your medicine. What should I watch for while using this medication? Visit your care team regularly. Tell your care team if your symptoms do not start to get better or if they get worse. You may need blood work done while you are taking this medication. You may need to follow a special diet. Talk to your care team. Foods that contain iron include: whole grains/cereals, dried fruits, beans, or peas, leafy green vegetables, and organ meats (liver, kidney). What side effects may I notice from receiving this medication? Side effects that you should report to your care team as soon as possible: Allergic reactions--skin rash, itching, hives, swelling of the face, lips, tongue, or throat Low blood pressure--dizziness, feeling faint or lightheaded, blurry vision Shortness of breath Side effects that usually do not require medical attention (report to your care team if they continue or are bothersome): Flushing Headache Joint pain Muscle pain Nausea Pain, redness, or irritation at injection site This list may not describe all possible side effects. Call your doctor for medical advice about side effects. You may report side effects to FDA at 1-800-FDA-1088. Where should I keep my medication? This medication is given in a hospital or clinic. It will not be stored at home. NOTE: This sheet is a summary. It may not cover all possible information. If you have questions about this medicine, talk to your doctor, pharmacist, or health care provider.  2024 Elsevier/Gold Standard (2023-05-07 00:00:00)

## 2023-07-29 NOTE — Progress Notes (Signed)
Patient completed Venofer today without any complications. Vitals signs stable. Patient declined to stay for 30 minute post observation period. Informed to go to ED if signs of allergic reaction occur. Patient verbalized understanding.

## 2023-08-05 ENCOUNTER — Inpatient Hospital Stay: Payer: Medicaid Other

## 2023-08-05 VITALS — BP 124/77 | HR 68 | Temp 97.5°F

## 2023-08-05 DIAGNOSIS — E538 Deficiency of other specified B group vitamins: Secondary | ICD-10-CM | POA: Diagnosis not present

## 2023-08-05 DIAGNOSIS — D500 Iron deficiency anemia secondary to blood loss (chronic): Secondary | ICD-10-CM

## 2023-08-05 DIAGNOSIS — D509 Iron deficiency anemia, unspecified: Secondary | ICD-10-CM | POA: Diagnosis not present

## 2023-08-05 MED ORDER — SODIUM CHLORIDE 0.9 % IV SOLN
INTRAVENOUS | Status: DC
  Filled 2023-08-05: qty 250

## 2023-08-05 MED ORDER — SODIUM CHLORIDE 0.9 % IV SOLN
200.0000 mg | Freq: Once | INTRAVENOUS | Status: AC
  Administered 2023-08-05: 200 mg via INTRAVENOUS
  Filled 2023-08-05: qty 200

## 2023-08-05 MED ORDER — CYANOCOBALAMIN 1000 MCG/ML IJ SOLN
1000.0000 ug | Freq: Once | INTRAMUSCULAR | Status: AC
  Administered 2023-08-05: 1000 ug via INTRAMUSCULAR
  Filled 2023-08-05: qty 1

## 2023-08-06 ENCOUNTER — Other Ambulatory Visit: Payer: Medicaid Other

## 2023-08-06 ENCOUNTER — Ambulatory Visit: Payer: Medicaid Other | Admitting: Oncology

## 2023-08-10 ENCOUNTER — Ambulatory Visit: Payer: Medicaid Other | Admitting: Dermatology

## 2023-08-11 ENCOUNTER — Encounter: Payer: Self-pay | Admitting: Nurse Practitioner

## 2023-08-11 ENCOUNTER — Ambulatory Visit: Payer: Medicaid Other | Admitting: Nurse Practitioner

## 2023-08-11 VITALS — BP 122/82 | HR 67 | Temp 98.2°F | Ht <= 58 in | Wt 212.6 lb

## 2023-08-11 DIAGNOSIS — K219 Gastro-esophageal reflux disease without esophagitis: Secondary | ICD-10-CM

## 2023-08-11 DIAGNOSIS — D696 Thrombocytopenia, unspecified: Secondary | ICD-10-CM

## 2023-08-11 DIAGNOSIS — E039 Hypothyroidism, unspecified: Secondary | ICD-10-CM | POA: Diagnosis not present

## 2023-08-11 DIAGNOSIS — D709 Neutropenia, unspecified: Secondary | ICD-10-CM

## 2023-08-11 DIAGNOSIS — E782 Mixed hyperlipidemia: Secondary | ICD-10-CM

## 2023-08-11 DIAGNOSIS — K7469 Other cirrhosis of liver: Secondary | ICD-10-CM | POA: Diagnosis not present

## 2023-08-11 DIAGNOSIS — D500 Iron deficiency anemia secondary to blood loss (chronic): Secondary | ICD-10-CM

## 2023-08-11 NOTE — Progress Notes (Signed)
Bethanie Dicker, NP-C Phone: [phone]  Brandi Bright is a 52 y.o. female who presents today to establish care. She followed closely by Hematology/Oncology and Gastroenterology. She has no complaints or new concerns today. She is doing well on all of her medications.   HYPOTHYROIDISM Disease Monitoring Weight changes: Yes, gaining  Skin Changes: No Palpitations: Yes Heat/Cold intolerance: No Medication Monitoring Compliance:  Levothyroxine   Last TSH:   Lab Results  Component Value Date   TSH 0.05 (L) 08/11/2023   GERD:   Reflux symptoms: None with medication   Abd pain: No   Blood in stool: No  Dysphagia: Yes   EGD: 2024  Medication: Omeprazole  HYPERLIPIDEMIA Symptoms Chest pain on exertion:  No   Leg claudication:   No Medications: Compliance- None Right upper quadrant pain- No  Muscle aches- No Lipid Panel     Component Value Date/Time   CHOL 125 08/11/2023 1607   CHOL 109 08/25/2013 0617   TRIG 108.0 08/11/2023 1607   TRIG 107 08/25/2013 0617   HDL 30.70 (L) 08/11/2023 1607   HDL 21 (L) 08/25/2013 0617   CHOLHDL 4 08/11/2023 1607   VLDL 21.6 08/11/2023 1607   VLDL 21 08/25/2013 0617   LDLCALC 73 08/11/2023 1607   LDLCALC 69 07/08/2022 0917   LDLCALC 67 08/25/2013 0617    Active Ambulatory Problems    Diagnosis Date Noted   Chest wall pain 12/29/2017   Benign neoplasm of descending colon    Benign neoplasm of ascending colon    Family history of malignant neoplasm of gastrointestinal tract    Benign neoplasm of transverse colon    Polyp of sigmoid colon    Normocytic anemia 09/23/2018   Thrombocytopenia (HCC) 09/23/2018   Leukopenia 09/23/2018   Splenomegaly 09/23/2018   Other cirrhosis of liver (HCC)    Esophageal varices without bleeding (HCC)    Stomach irritation    Iron deficiency anemia 10/31/2018   Low serum copper for age 57/18/2019   Positive ANA (antinuclear antibody) 10/31/2018   GI bleed 06/24/2019   Anxiety and depression 04/30/2014    Chronic pain with drug dependence (HCC) 04/30/2014   Coronary artery disease 04/30/2014   Degenerative disc disease 04/30/2014   Hyperlipidemia 04/30/2014   Essential hypertension 04/30/2014   Hypothyroidism 04/30/2014   Dysphagia    Gastritis without bleeding    Portal hypertension (HCC)    Elevated troponin 01/10/2020   Iron deficiency anemia due to chronic blood loss 01/16/2020   Angina of effort 04/24/2020   Neutropenia (HCC) 04/24/2020   RLQ abdominal pain 08/26/2020   Bruising 08/26/2020   Annual physical exam 10/11/2020   Obesity, Class III, BMI 40-49.9 (morbid obesity) (HCC) 10/11/2020   Neck arthropathy 12/24/2020   Sinusitis 04/07/2021   Oropharyngeal dysphagia 04/07/2021   Rectal polyp    Cecal polyp    Polyp of transverse colon    Pleurisy 01/13/2022   Nonintractable headache 11/16/2022   TIA (transient ischemic attack) 03/31/2023   GERD without esophagitis 04/01/2023   History of hepatitis C 08/25/2021   Spondylolisthesis of lumbar region 12/01/2022   Osteoarthritis of knee 05/13/2023   Non-alcoholic fatty liver disease 08/25/2021   Low back pain 12/01/2022   Lumbar pain 12/01/2022   Coagulopathy (HCC) 07/09/2023   B12 deficiency 07/20/2023   Resolved Ambulatory Problems    Diagnosis Date Noted   Colon cancer screening    Gastric polyp    Low vitamin B12 level 10/31/2018   GI bleeding 10/11/2019  Obesity (BMI 35.0-39.9 without comorbidity) 06/07/2020   Acute otitis externa of both ears 06/07/2020   Close exposure to COVID-19 virus 08/26/2020   Encounter for screening for malignant neoplasm of breast 10/11/2020   History of colonic polyps    Dyslipidemia 08/02/2022   Cough 11/16/2022   Past Medical History:  Diagnosis Date   Arthritis    Back pain    Chronic knee pain    Cirrhosis (HCC)    Gout    Heart attack (HCC)    History of blood transfusion    HLD (hyperlipidemia)    IDA (iron deficiency anemia) 01/16/2020   MI, old    Thyroid disease      Family History  Problem Relation Age of Onset   Stroke Mother    Hypertension Mother    Hyperlipidemia Mother    Diabetes Mother    COPD Mother    Cancer Mother    Arthritis Mother    Hypertension Father    COPD Father    Cancer Father    CAD Father    Colon cancer Father    Lung cancer Father    Heart attack Father    Stroke Sister    Kidney disease Sister    Hypertension Sister    Hyperlipidemia Sister    Diabetes Sister    COPD Sister    Arthritis Sister    Hyperlipidemia Sister    Arthritis Sister    Stroke Sister    Hypertension Sister    Hyperlipidemia Sister    Heart disease Sister    Heart attack Sister    Diabetes Sister    COPD Sister    Asthma Sister    Arthritis Sister    Stroke Brother    Hyperlipidemia Brother    COPD Brother    Arthritis Brother    Pancreatic cancer Brother    Arthritis Maternal Grandmother    Arthritis Maternal Grandfather    Arthritis Paternal Grandmother    Arthritis Paternal Grandfather    Hypertension Other     Social History   Socioeconomic History   Marital status: Single    Spouse name: Not on file   Number of children: Not on file   Years of education: Not on file   Highest education level: Not on file  Occupational History   Not on file  Tobacco Use   Smoking status: Never   Smokeless tobacco: Never  Vaping Use   Vaping status: Never Used  Substance and Sexual Activity   Alcohol use: Yes    Comment: occ   Drug use: Yes    Comment: prescribed oxycodone and oxycotin   Sexual activity: Not Currently  Other Topics Concern   Not on file  Social History Narrative   Not on file   Social Determinants of Health   Financial Resource Strain: Medium Risk (10/11/2019)   Overall Financial Resource Strain (CARDIA)    Difficulty of Paying Living Expenses: Somewhat hard  Food Insecurity: No Food Insecurity (04/01/2023)   Hunger Vital Sign    Worried About Running Out of Food in the Last Year: Never true     Ran Out of Food in the Last Year: Never true  Transportation Needs: No Transportation Needs (04/01/2023)   PRAPARE - Administrator, Civil Service (Medical): No    Lack of Transportation (Non-Medical): No  Physical Activity: Inactive (10/11/2019)   Exercise Vital Sign    Days of Exercise per Week: 0 days  Minutes of Exercise per Session: 0 min  Stress: Stress Concern Present (10/11/2019)   [employer] of Occupational Health - Occupational Stress Questionnaire    Feeling of Stress : Rather much  Social Connections: Unknown (10/11/2019)   Social Connection and Isolation Panel [NHANES]    Frequency of Communication with Friends and Family: More than three times a week    Frequency of Social Gatherings with Friends and Family: More than three times a week    Attends Religious Services: Never    Database administrator or Organizations: No    Attends Engineer, structural: Not asked    Marital Status: Not on file  Intimate Partner Violence: Not At Risk (04/01/2023)   Humiliation, Afraid, Rape, and Kick questionnaire    Fear of Current or Ex-Partner: No    Emotionally Abused: No    Physically Abused: No    Sexually Abused: No    ROS  General:  Negative for unexplained weight loss, fever Skin: Negative for new or changing mole, sore that won't heal HEENT: Negative for trouble hearing, trouble seeing, ringing in ears, mouth sores, hoarseness, change in voice, dysphagia. CV:  Negative for chest pain, dyspnea, edema, palpitations Resp: Negative for cough, dyspnea, hemoptysis GI: Negative for nausea, vomiting, diarrhea, constipation, abdominal pain, melena, hematochezia. GU: Negative for dysuria, incontinence, urinary hesitance, hematuria, vaginal or penile discharge, polyuria, sexual difficulty, lumps in testicle or breasts MSK: Negative for muscle cramps or aches, joint pain or swelling Neuro: Negative for headaches, weakness, numbness, dizziness, passing  out/fainting Psych: Negative for depression, anxiety, memory problems  Objective  Physical Exam Vitals:   08/11/23 1527  BP: 122/82  Pulse: 67  Temp: 98.2 F (36.8 C)  SpO2: 98%    BP Readings from Last 3 Encounters:  08/19/23 120/72  08/12/23 133/71  08/11/23 122/82   Wt Readings from Last 3 Encounters:  08/11/23 212 lb 9.6 oz (96.4 kg)  07/09/23 210 lb 14.4 oz (95.7 kg)  05/17/23 216 lb (98 kg)    Physical Exam Constitutional:      General: She is not in acute distress.    Appearance: Normal appearance.  HENT:     Head: Normocephalic.  Cardiovascular:     Rate and Rhythm: Normal rate and regular rhythm.     Heart sounds: Normal heart sounds.  Pulmonary:     Effort: Pulmonary effort is normal.     Breath sounds: Normal breath sounds.  Skin:    General: Skin is warm and dry.  Neurological:     General: No focal deficit present.     Mental Status: She is alert.  Psychiatric:        Mood and Affect: Mood normal.        Behavior: Behavior normal.    Assessment/Plan:   Acquired hypothyroidism Assessment & Plan: Chronic issue. TSH noted to be low over the last several months. Currently on Levothyroxine 150 mcg daily. Will check TSH and T4 today.   Orders: -     TSH -     T4, free  GERD without esophagitis Assessment & Plan: Chronic issue. Managed by Gastroenterology. Continue Omeprazole. Encouraged to monitor diet for triggers. Advised decreasing caffeine, spicy and acidic foods. Dietary information provided to patient.    Moderate mixed hyperlipidemia not requiring statin therapy Assessment & Plan: Chronic issue. Not on any medications. Will check lipid panel today. Encouraged healthy diet and exercise.  Orders: -     Lipid panel  Obesity, Class III,  BMI 40-49.9 (morbid obesity) (HCC) Assessment & Plan: Will check A1c today. Encouraged healthy diet and exercise.   Orders: -     Hemoglobin A1c  Other cirrhosis of liver (HCC) Assessment &  Plan: Managed by Gastroenterology. Needing EGD, cannot be done at this time due to neutropenia. Follow up as scheduled.   Orders: -     Comprehensive metabolic panel  Iron deficiency anemia due to chronic blood loss Assessment & Plan: Managed by Hematology. Having weekly iron infusions for 4 weeks. Follow up as scheduled.   Orders: -     CBC with Differential/Platelet  Thrombocytopenia (HCC) Assessment & Plan: Chronic issue. Managed by Hematology. Follow up as scheduled. Will check CBC today.   Orders: -     CBC with Differential/Platelet  Neutropenia, unspecified type (HCC) Assessment & Plan: Chronic issue. Managed by Hematology. Follow up as scheduled. Will check CBC today.   Orders: -     CBC with Differential/Platelet -     VITAMIN D 25 Hydroxy (Vit-D Deficiency, Fractures)    Return in about 3 months (around 11/11/2023) for Follow up.   Bethanie Dicker, NP-C [facility] - [employer]

## 2023-08-12 ENCOUNTER — Telehealth: Payer: Self-pay

## 2023-08-12 ENCOUNTER — Inpatient Hospital Stay: Payer: Medicaid Other

## 2023-08-12 ENCOUNTER — Other Ambulatory Visit: Payer: Self-pay | Admitting: Nurse Practitioner

## 2023-08-12 VITALS — BP 133/71 | HR 58 | Temp 98.7°F

## 2023-08-12 DIAGNOSIS — D500 Iron deficiency anemia secondary to blood loss (chronic): Secondary | ICD-10-CM

## 2023-08-12 DIAGNOSIS — E039 Hypothyroidism, unspecified: Secondary | ICD-10-CM

## 2023-08-12 DIAGNOSIS — D509 Iron deficiency anemia, unspecified: Secondary | ICD-10-CM | POA: Diagnosis not present

## 2023-08-12 DIAGNOSIS — E538 Deficiency of other specified B group vitamins: Secondary | ICD-10-CM | POA: Diagnosis not present

## 2023-08-12 LAB — CBC WITH DIFFERENTIAL/PLATELET
Basophils Absolute: 0 10*3/uL (ref 0.0–0.1)
Basophils Relative: 1.1 % (ref 0.0–3.0)
Eosinophils Absolute: 0.1 10*3/uL (ref 0.0–0.7)
Eosinophils Relative: 2.8 % (ref 0.0–5.0)
HCT: 34.4 % — ABNORMAL LOW (ref 36.0–46.0)
Hemoglobin: 11.3 g/dL — ABNORMAL LOW (ref 12.0–15.0)
Lymphocytes Relative: 27.5 % (ref 12.0–46.0)
Lymphs Abs: 0.5 10*3/uL — ABNORMAL LOW (ref 0.7–4.0)
MCHC: 33 g/dL (ref 30.0–36.0)
MCV: 83.3 fl (ref 78.0–100.0)
Monocytes Absolute: 0.1 10*3/uL (ref 0.1–1.0)
Monocytes Relative: 7.5 % (ref 3.0–12.0)
Neutro Abs: 1.2 10*3/uL — ABNORMAL LOW (ref 1.4–7.7)
Neutrophils Relative %: 61.1 % (ref 43.0–77.0)
Platelets: 45 10*3/uL — CL (ref 150.0–400.0)
RBC: 4.13 Mil/uL (ref 3.87–5.11)
RDW: 15 % (ref 11.5–15.5)
WBC: 1.9 10*3/uL — CL (ref 4.0–10.5)

## 2023-08-12 LAB — LIPID PANEL
Cholesterol: 125 mg/dL (ref 0–200)
HDL: 30.7 mg/dL — ABNORMAL LOW (ref >39.00)
LDL Cholesterol: 73 mg/dL (ref 0–99)
NonHDL: 94.66
Total CHOL/HDL Ratio: 4
Triglycerides: 108 mg/dL (ref 0.0–149.0)
VLDL: 21.6 mg/dL (ref 0.0–40.0)

## 2023-08-12 LAB — COMPREHENSIVE METABOLIC PANEL
ALT: 17 U/L (ref 0–35)
AST: 29 U/L (ref 0–37)
Albumin: 3.6 g/dL (ref 3.5–5.2)
Alkaline Phosphatase: 95 U/L (ref 39–117)
BUN: 9 mg/dL (ref 6–23)
CO2: 26 meq/L (ref 19–32)
Calcium: 9.3 mg/dL (ref 8.4–10.5)
Chloride: 110 meq/L (ref 96–112)
Creatinine, Ser: 0.66 mg/dL (ref 0.40–1.20)
GFR: 100.19 mL/min (ref >60.00)
Glucose, Bld: 86 mg/dL (ref 70–99)
Potassium: 4.3 meq/L (ref 3.5–5.1)
Sodium: 142 meq/L (ref 135–145)
Total Bilirubin: 1 mg/dL (ref 0.2–1.2)
Total Protein: 6.5 g/dL (ref 6.0–8.3)

## 2023-08-12 LAB — T4, FREE: Free T4: 1.05 ng/dL (ref 0.60–1.60)

## 2023-08-12 LAB — TSH: TSH: 0.05 u[IU]/mL — ABNORMAL LOW (ref 0.35–5.50)

## 2023-08-12 LAB — HEMOGLOBIN A1C: Hgb A1c MFr Bld: 5 % (ref 4.6–6.5)

## 2023-08-12 LAB — VITAMIN D 25 HYDROXY (VIT D DEFICIENCY, FRACTURES): VITD: 52.87 ng/mL (ref 30.00–100.00)

## 2023-08-12 MED ORDER — SODIUM CHLORIDE 0.9 % IV SOLN
INTRAVENOUS | Status: DC
  Filled 2023-08-12: qty 250

## 2023-08-12 MED ORDER — CYANOCOBALAMIN 1000 MCG/ML IJ SOLN
1000.0000 ug | Freq: Once | INTRAMUSCULAR | Status: AC
  Administered 2023-08-12: 1000 ug via INTRAMUSCULAR
  Filled 2023-08-12: qty 1

## 2023-08-12 MED ORDER — SODIUM CHLORIDE 0.9 % IV SOLN
200.0000 mg | Freq: Once | INTRAVENOUS | Status: AC
  Administered 2023-08-12: 200 mg via INTRAVENOUS
  Filled 2023-08-12: qty 200

## 2023-08-12 MED ORDER — LEVOTHYROXINE SODIUM 137 MCG PO TABS
137.0000 ug | ORAL_TABLET | Freq: Every day | ORAL | 1 refills | Status: DC
Start: 2023-08-12 — End: 2024-01-26

## 2023-08-12 NOTE — Telephone Encounter (Signed)
Critical values documented in another phone note and routed to provider

## 2023-08-12 NOTE — Telephone Encounter (Signed)
Clydie Braun called from [facility] with a critical lab.  I transferred call to Donavan Foil, CMA.

## 2023-08-12 NOTE — Telephone Encounter (Signed)
Clydie Braun from [facility]  lab called for pt who has a critical White blood count and a a Critical Platelet count  CRITICAL VALUE STICKER  CRITICAL VALUE:  WHITE BLOOD CELL COUNT: 1.9  PLATELET COUNT: 45  RECEIVER (on-site recipient of call): La-Tavia P. CMA  DATE & TIME NOTIFIED: 08-12-23 @ 10:20 am   MESSENGER (representative from lab): Clydie Braun   MD NOTIFIED: Bethanie Dicker, NP  TIME OF NOTIFICATION: 10: 20 am   RESPONSE:  sent to provider too inform

## 2023-08-19 ENCOUNTER — Inpatient Hospital Stay: Payer: Medicaid Other | Attending: Oncology

## 2023-08-19 VITALS — BP 120/72 | HR 69 | Temp 97.8°F | Resp 18

## 2023-08-19 DIAGNOSIS — E538 Deficiency of other specified B group vitamins: Secondary | ICD-10-CM | POA: Diagnosis not present

## 2023-08-19 DIAGNOSIS — D509 Iron deficiency anemia, unspecified: Secondary | ICD-10-CM | POA: Diagnosis not present

## 2023-08-19 DIAGNOSIS — D500 Iron deficiency anemia secondary to blood loss (chronic): Secondary | ICD-10-CM

## 2023-08-19 MED ORDER — CYANOCOBALAMIN 1000 MCG/ML IJ SOLN
1000.0000 ug | Freq: Once | INTRAMUSCULAR | Status: AC
  Administered 2023-08-19: 1000 ug via INTRAMUSCULAR
  Filled 2023-08-19: qty 1

## 2023-08-19 MED ORDER — SODIUM CHLORIDE 0.9 % IV SOLN
Freq: Once | INTRAVENOUS | Status: AC
  Filled 2023-08-19: qty 250

## 2023-08-19 MED ORDER — SODIUM CHLORIDE 0.9 % IV SOLN
200.0000 mg | Freq: Once | INTRAVENOUS | Status: AC
  Administered 2023-08-19: 200 mg via INTRAVENOUS
  Filled 2023-08-19: qty 200

## 2023-08-19 MED ORDER — SODIUM CHLORIDE 0.9% FLUSH
10.0000 mL | Freq: Once | INTRAVENOUS | Status: AC | PRN
  Administered 2023-08-19: 10 mL
  Filled 2023-08-19: qty 10

## 2023-08-19 NOTE — Patient Instructions (Signed)
 Iron Sucrose Injection What is this medication? IRON SUCROSE (EYE ern SOO krose) treats low levels of iron (iron deficiency anemia) in people with kidney disease. Iron is a mineral that plays an important role in making red blood cells, which carry oxygen from your lungs to the rest of your body. This medicine may be used for other purposes; ask your health care provider or pharmacist if you have questions. COMMON BRAND NAME(S): Venofer What should I tell my care team before I take this medication? They need to know if you have any of these conditions: Anemia not caused by low iron levels Heart disease High levels of iron in the blood Kidney disease Liver disease An unusual or allergic reaction to iron, other medications, foods, dyes, or preservatives Pregnant or trying to get pregnant Breastfeeding How should I use this medication? This medication is for infusion into a vein. It is given in a hospital or clinic setting. Talk to your care team about the use of this medication in children. While this medication may be prescribed for children as young as 2 years for selected conditions, precautions do apply. Overdosage: If you think you have taken too much of this medicine contact a poison control center or emergency room at once. NOTE: This medicine is only for you. Do not share this medicine with others. What if I miss a dose? Keep appointments for follow-up doses. It is important not to miss your dose. Call your care team if you are unable to keep an appointment. What may interact with this medication? Do not take this medication with any of the following: Deferoxamine Dimercaprol Other iron products This medication may also interact with the following: Chloramphenicol Deferasirox This list may not describe all possible interactions. Give your health care provider a list of all the medicines, herbs, non-prescription drugs, or dietary supplements you use. Also tell them if you smoke,  drink alcohol, or use illegal drugs. Some items may interact with your medicine. What should I watch for while using this medication? Visit your care team regularly. Tell your care team if your symptoms do not start to get better or if they get worse. You may need blood work done while you are taking this medication. You may need to follow a special diet. Talk to your care team. Foods that contain iron include: whole grains/cereals, dried fruits, beans, or peas, leafy green vegetables, and organ meats (liver, kidney). What side effects may I notice from receiving this medication? Side effects that you should report to your care team as soon as possible: Allergic reactions--skin rash, itching, hives, swelling of the face, lips, tongue, or throat Low blood pressure--dizziness, feeling faint or lightheaded, blurry vision Shortness of breath Side effects that usually do not require medical attention (report to your care team if they continue or are bothersome): Flushing Headache Joint pain Muscle pain Nausea Pain, redness, or irritation at injection site This list may not describe all possible side effects. Call your doctor for medical advice about side effects. You may report side effects to FDA at 1-800-FDA-1088. Where should I keep my medication? This medication is given in a hospital or clinic. It will not be stored at home. NOTE: This sheet is a summary. It may not cover all possible information. If you have questions about this medicine, talk to your doctor, pharmacist, or health care provider.  2024 Elsevier/Gold Standard (2023-05-07 00:00:00)

## 2023-08-19 NOTE — Progress Notes (Signed)
Patient tolerated Venofer infusion and B12 inj well. Explained recommendation of 30 min post monitoring. Patient refused to wait post monitoring. Educated on what signs to watch for & to call with any concerns. No questions, discharged. Stable

## 2023-08-24 NOTE — Assessment & Plan Note (Signed)
Chronic issue. TSH noted to be low over the last several months. Currently on Levothyroxine 150 mcg daily. Will check TSH and T4 today.

## 2023-08-24 NOTE — Assessment & Plan Note (Signed)
Chronic issue. Managed by Gastroenterology. Continue Omeprazole. Encouraged to monitor diet for triggers. Advised decreasing caffeine, spicy and acidic foods. Dietary information provided to patient.

## 2023-08-24 NOTE — Assessment & Plan Note (Signed)
Managed by Hematology. Having weekly iron infusions for 4 weeks. Follow up as scheduled.

## 2023-08-24 NOTE — Assessment & Plan Note (Signed)
Will check A1c today. Encouraged healthy diet and exercise.  

## 2023-08-24 NOTE — Assessment & Plan Note (Signed)
Chronic issue. Managed by Hematology. Follow up as scheduled. Will check CBC today.

## 2023-08-24 NOTE — Assessment & Plan Note (Signed)
Chronic issue. Not on any medications. Will check lipid panel today. Encouraged healthy diet and exercise.

## 2023-08-24 NOTE — Assessment & Plan Note (Signed)
Managed by Gastroenterology. Needing EGD, cannot be done at this time due to neutropenia. Follow up as scheduled.

## 2023-09-06 ENCOUNTER — Inpatient Hospital Stay: Payer: Medicaid Other

## 2023-09-08 ENCOUNTER — Inpatient Hospital Stay (HOSPITAL_BASED_OUTPATIENT_CLINIC_OR_DEPARTMENT_OTHER): Payer: Medicaid Other | Admitting: Oncology

## 2023-09-08 ENCOUNTER — Inpatient Hospital Stay: Payer: Medicaid Other

## 2023-09-08 ENCOUNTER — Encounter: Payer: Self-pay | Admitting: Oncology

## 2023-09-08 ENCOUNTER — Telehealth: Payer: Self-pay

## 2023-09-08 ENCOUNTER — Other Ambulatory Visit: Payer: Self-pay

## 2023-09-08 VITALS — BP 142/83 | HR 64 | Temp 97.9°F | Resp 18 | Wt 210.8 lb

## 2023-09-08 DIAGNOSIS — D500 Iron deficiency anemia secondary to blood loss (chronic): Secondary | ICD-10-CM

## 2023-09-08 DIAGNOSIS — D709 Neutropenia, unspecified: Secondary | ICD-10-CM

## 2023-09-08 DIAGNOSIS — K7469 Other cirrhosis of liver: Secondary | ICD-10-CM

## 2023-09-08 DIAGNOSIS — E538 Deficiency of other specified B group vitamins: Secondary | ICD-10-CM

## 2023-09-08 DIAGNOSIS — D696 Thrombocytopenia, unspecified: Secondary | ICD-10-CM | POA: Diagnosis not present

## 2023-09-08 DIAGNOSIS — Z1231 Encounter for screening mammogram for malignant neoplasm of breast: Secondary | ICD-10-CM

## 2023-09-08 DIAGNOSIS — D509 Iron deficiency anemia, unspecified: Secondary | ICD-10-CM | POA: Diagnosis not present

## 2023-09-08 LAB — IRON AND TIBC
Iron: 133 ug/dL (ref 28–170)
Saturation Ratios: 34 % — ABNORMAL HIGH (ref 10.4–31.8)
TIBC: 393 ug/dL (ref 250–450)
UIBC: 260 ug/dL

## 2023-09-08 LAB — CBC WITH DIFFERENTIAL (CANCER CENTER ONLY)
Abs Immature Granulocytes: 0 10*3/uL (ref 0.00–0.07)
Basophils Absolute: 0 10*3/uL (ref 0.0–0.1)
Basophils Relative: 1 %
Eosinophils Absolute: 0.1 10*3/uL (ref 0.0–0.5)
Eosinophils Relative: 3 %
HCT: 36.5 % (ref 36.0–46.0)
Hemoglobin: 12 g/dL (ref 12.0–15.0)
Immature Granulocytes: 0 %
Lymphocytes Relative: 27 %
Lymphs Abs: 0.5 10*3/uL — ABNORMAL LOW (ref 0.7–4.0)
MCH: 28.2 pg (ref 26.0–34.0)
MCHC: 32.9 g/dL (ref 30.0–36.0)
MCV: 85.9 fL (ref 80.0–100.0)
Monocytes Absolute: 0.2 10*3/uL (ref 0.1–1.0)
Monocytes Relative: 8 %
Neutro Abs: 1.2 10*3/uL — ABNORMAL LOW (ref 1.7–7.7)
Neutrophils Relative %: 61 %
Platelet Count: 38 10*3/uL — ABNORMAL LOW (ref 150–400)
RBC: 4.25 MIL/uL (ref 3.87–5.11)
RDW: 16.1 % — ABNORMAL HIGH (ref 11.5–15.5)
Smear Review: NORMAL
WBC Count: 2 10*3/uL — ABNORMAL LOW (ref 4.0–10.5)
nRBC: 0 % (ref 0.0–0.2)

## 2023-09-08 LAB — RETIC PANEL
Immature Retic Fract: 2.2 % — ABNORMAL LOW (ref 2.3–15.9)
RBC.: 4.24 MIL/uL (ref 3.87–5.11)
Retic Count, Absolute: 59.4 10*3/uL (ref 19.0–186.0)
Retic Ct Pct: 1.4 % (ref 0.4–3.1)
Reticulocyte Hemoglobin: 33.3 pg (ref >27.9)

## 2023-09-08 LAB — VITAMIN B12: Vitamin B-12: 359 pg/mL (ref 180–914)

## 2023-09-08 LAB — FERRITIN: Ferritin: 32 ng/mL (ref 11–307)

## 2023-09-08 MED ORDER — B-12 2500 MCG SL SUBL: 1.0000 | SUBLINGUAL_TABLET | Freq: Every day | SUBLINGUAL | 1 refills | Status: DC

## 2023-09-08 NOTE — Assessment & Plan Note (Signed)
Follow-up with GI

## 2023-09-08 NOTE — Progress Notes (Signed)
Hematology/Oncology Progress note Telephone:([phone] Fax:([phone]    Chief Complaint: Brandi Bright is a 52 y.o. female follows up for management of iron deficiency anemia, thrombocytopenia, and leukopenia   ASSESSMENT & PLAN:   Thrombocytopenia (HCC) # Chronic neutropenia and thrombocytopenia Previous Bone marrow biopsy work-up was negative  Low counts are due to chronic cirrhosis/splenomegaly. There is plan for patient to get upper endoscopy. Recommend Avatrombopag 60 mg once daily for 5 consecutive days, start 10 to 13 days prior to the scheduled procedure Will check insurance approval.    Other cirrhosis of liver (HCC) Follow up with GI.    Iron deficiency anemia Lab Results  Component Value Date   HGB 12.0 09/08/2023   TIBC 393 09/08/2023   IRONPCTSAT 34 (H) 09/08/2023   FERRITIN 32 09/08/2023  Hemoglobin has improved. monitor    Neutropenia (HCC) ANC has improved as B12 level is improved.    B12 deficiency Recommend B12 sublingual supplementation. Rx sent to pharmacy.    Orders Placed This Encounter  Procedures   Retic Panel    Standing Status:   Future    Number of Occurrences:   1    Standing Expiration Date:   09/07/2024   Follow up 6 months.  All questions were answered. The patient knows to call the clinic with any problems, questions or concerns.  Rickard Patience, MD, PhD [facility] Hematology Oncology 09/08/2023    PERTINENT HEMATOLOGY HISTORY Patient follows up with Dr. Merlene Pulling previously.  Establish care with me on 02/09/2019. Reviewed patient's previous medical records, labs, imaging results. # History of cirrhosis and hepatitis C.  She has a history of chronic anemia, thrombocytopenia and leukopenia dating back to 03/2012.  Platelet count has fluctuated between 54,000 - 56,000 since 12/28/2017 (previously 73,000 - 134,000).  Ferritin was 14 on 08/31/2018.    Work-up on 09/23/2018 revealed a hematocrit of 31.0, hemoglobin 10.1, MCV 87.6,  platelets 46,000, WBC 1900 with an ANC of 1100.  Ferritin was 10 (low) with iron saturation 16% with a TIBC of 401.  Retic was 1.2%.  B12 was 315.  Normal studies included: folate, SPEP, and free light chain ratio.  Copper was 69 (72-166).  ANA was + with double stranded DNA antibody 13 (0-9).  TSH was 0.036 (0.35-4.5) with a free T4 1.05 (0.82-1.77).  Peripheral smear revealed variant lymphocytes.  Ferritin has been followed:  14 on 08/31/2018 and 10 on 09/23/2018.  Abdomen and pelvic CT on 07/14/2018 revealed cirrhosis with portal hypertension noted by prominent splenomegaly (18.5 x 13.8 x 8.1 cm; volume 1100 cm3), enlarged portal veins with recannulated umbilical vein, paraesophageal and perigastric varices.  There was mild thickening of the cecum and ascending colon.  EGD on 10/27/2018 revealed grade II esophageal varices.  There was erythematous mucosa in the antrum.  There was congested, erythematous, friable (with contact bleeding), granular and nodular mucosa in the gastric fundus and astric body.  There was a single gastric polyp (polypoid ulcerated antral type mucos with chronic active mucosal inflammation).  There was a normal duodenal bulb, second portion of the duodenum and examined duodenum.  The patient's iron deficiency could be explained by her friable gastric mucosa (likely from portal hypertension).  There was no history of variceal bleeding and thus this is not a cause of her iron deficiency.  # admitted from 10/11/2019 to 10/12/2019 due to rectal bleeding EGD 10/12/2019 showed portal hypertensive gastropathy, treated with APC.  Nonbleeding large esophageal varices incompletely evaluated.  Banded. Patient follows  with gastroenterology and had EGD on 11/20/2019. Banding was not done due to large amount of food in the stomach. There was plan for EGD on 11/23/2019.  Her gastroenterologist Dr. Maximino Greenland contacted me to see if patient can get platelet transfusion to improve her platelet  counts for banding on 11/23/2019.     INTERVAL HISTORY Brandi Bright is a 52 y.o. female who has above history reviewed by me today presents for follow up visit for management of thrombocytopenia, splenomegaly, leukopenia and anemia. She feels well today. Denies weight loss, fever, chills, fatigue, night sweats.  + easy bruising + intermittent rectal bleeding.  She re-established care with GI. Was recommend EGD, however procedure was held due to neutropenia.  Today she reports that dysphagia has improved.   Review of Systems  Constitutional:  Positive for fatigue. Negative for appetite change, chills and fever.  HENT:   Negative for hearing loss and voice change.   Eyes:  Negative for eye problems.  Respiratory:  Negative for chest tightness and cough.   Cardiovascular:  Negative for chest pain.  Gastrointestinal:  Positive for blood in stool. Negative for abdominal distention and abdominal pain.  Endocrine: Negative for hot flashes.  Genitourinary:  Negative for difficulty urinating and frequency.   Musculoskeletal:  Negative for arthralgias.  Skin:  Negative for itching and rash.  Neurological:  Negative for extremity weakness.  Hematological:  Negative for adenopathy. Bruises/bleeds easily.  Psychiatric/Behavioral:  Negative for confusion.    Past Medical History:  Diagnosis Date   Arthritis    Back pain    Chronic knee pain    Cirrhosis (HCC)    Gout    Heart attack (HCC)    History of blood transfusion    HLD (hyperlipidemia)    Hypothyroidism    IDA (iron deficiency anemia) 01/16/2020   MI, old    Thyroid disease     Past Surgical History:  Procedure Laterality Date   CARDIAC CATHETERIZATION     CESAREAN SECTION     COLONOSCOPY WITH PROPOFOL N/A 03/16/2018   Procedure: COLONOSCOPY WITH PROPOFOL;  Surgeon: Pasty Spillers, MD;  Location: [facility] ENDOSCOPY;  Service: Endoscopy;  Laterality: N/A;   COLONOSCOPY WITH PROPOFOL N/A 07/09/2021   Procedure: COLONOSCOPY  WITH PROPOFOL;  Surgeon: Pasty Spillers, MD;  Location: [facility] ENDOSCOPY;  Service: Endoscopy;  Laterality: N/A;   ESOPHAGOGASTRODUODENOSCOPY N/A 10/11/2019   Procedure: ESOPHAGOGASTRODUODENOSCOPY (EGD);  Surgeon: Toney Reil, MD;  Location: [facility] ENDOSCOPY;  Service: Gastroenterology;  Laterality: N/A;   ESOPHAGOGASTRODUODENOSCOPY (EGD) WITH PROPOFOL N/A 10/27/2018   Procedure: ESOPHAGOGASTRODUODENOSCOPY (EGD) WITH PROPOFOL;  Surgeon: Pasty Spillers, MD;  Location: [facility] ENDOSCOPY;  Service: Endoscopy;  Laterality: N/A;   ESOPHAGOGASTRODUODENOSCOPY (EGD) WITH PROPOFOL N/A 06/25/2019   Procedure: ESOPHAGOGASTRODUODENOSCOPY (EGD) WITH PROPOFOL;  Surgeon: Wyline Mood, MD;  Location: [facility] ENDOSCOPY;  Service: Gastroenterology;  Laterality: N/A;   ESOPHAGOGASTRODUODENOSCOPY (EGD) WITH PROPOFOL N/A 09/12/2019   Procedure: ESOPHAGOGASTRODUODENOSCOPY (EGD) WITH PROPOFOL;  Surgeon: Midge Minium, MD;  Location: [facility] ENDOSCOPY;  Service: Endoscopy;  Laterality: N/A;   ESOPHAGOGASTRODUODENOSCOPY (EGD) WITH PROPOFOL N/A 11/20/2019   Procedure: ESOPHAGOGASTRODUODENOSCOPY (EGD) WITH PROPOFOL;  Surgeon: Pasty Spillers, MD;  Location: [facility] ENDOSCOPY;  Service: Endoscopy;  Laterality: N/A;   ESOPHAGOGASTRODUODENOSCOPY (EGD) WITH PROPOFOL N/A 11/23/2019   Procedure: ESOPHAGOGASTRODUODENOSCOPY (EGD) WITH PROPOFOL;  Surgeon: Pasty Spillers, MD;  Location: [facility] ENDOSCOPY;  Service: Endoscopy;  Laterality: N/A;   ESOPHAGOGASTRODUODENOSCOPY (EGD) WITH PROPOFOL N/A 01/03/2020   Procedure: ESOPHAGOGASTRODUODENOSCOPY (EGD) WITH PROPOFOL;  Surgeon:  Toney Reil, MD;  Location: [facility] ENDOSCOPY;  Service: Endoscopy;  Laterality: N/A;   ESOPHAGOGASTRODUODENOSCOPY (EGD) WITH PROPOFOL N/A 07/09/2021   Procedure: ESOPHAGOGASTRODUODENOSCOPY (EGD) WITH PROPOFOL;  Surgeon: Pasty Spillers, MD;  Location: [facility] ENDOSCOPY;  Service: Endoscopy;  Laterality: N/A;   ESOPHAGOGASTRODUODENOSCOPY (EGD) WITH  PROPOFOL N/A 05/17/2023   Procedure: ESOPHAGOGASTRODUODENOSCOPY (EGD) WITH PROPOFOL;  Surgeon: Toney Reil, MD;  Location: [facility] ENDOSCOPY;  Service: Gastroenterology;  Laterality: N/A;   TUBAL LIGATION      Family History  Problem Relation Age of Onset   Stroke Mother    Hypertension Mother    Hyperlipidemia Mother    Diabetes Mother    COPD Mother    Cancer Mother    Arthritis Mother    Hypertension Father    COPD Father    Cancer Father    CAD Father    Colon cancer Father    Lung cancer Father    Heart attack Father    Stroke Sister    Kidney disease Sister    Hypertension Sister    Hyperlipidemia Sister    Diabetes Sister    COPD Sister    Arthritis Sister    Hyperlipidemia Sister    Arthritis Sister    Stroke Sister    Hypertension Sister    Hyperlipidemia Sister    Heart disease Sister    Heart attack Sister    Diabetes Sister    COPD Sister    Asthma Sister    Arthritis Sister    Stroke Brother    Hyperlipidemia Brother    COPD Brother    Arthritis Brother    Pancreatic cancer Brother    Arthritis Maternal Grandmother    Arthritis Maternal Grandfather    Arthritis Paternal Grandmother    Arthritis Paternal Grandfather    Hypertension Other    Social History   Socioeconomic History   Marital status: Single    Spouse name: Not on file   Number of children: Not on file   Years of education: Not on file   Highest education level: Not on file  Occupational History   Not on file  Tobacco Use   Smoking status: Never   Smokeless tobacco: Never  Vaping Use   Vaping status: Never Used  Substance and Sexual Activity   Alcohol use: Yes    Comment: occ   Drug use: Yes    Comment: prescribed oxycodone and oxycotin   Sexual activity: Not Currently  Other Topics Concern   Not on file  Social History Narrative   Not on file   Social Determinants of Health   Financial Resource Strain: Medium Risk (10/11/2019)   Overall Financial Resource  Strain (CARDIA)    Difficulty of Paying Living Expenses: Somewhat hard  Food Insecurity: No Food Insecurity (04/01/2023)   Hunger Vital Sign    Worried About Running Out of Food in the Last Year: Never true    Ran Out of Food in the Last Year: Never true  Transportation Needs: No Transportation Needs (04/01/2023)   PRAPARE - Administrator, Civil Service (Medical): No    Lack of Transportation (Non-Medical): No  Physical Activity: Inactive (10/11/2019)   Exercise Vital Sign    Days of Exercise per Week: 0 days    Minutes of Exercise per Session: 0 min  Stress: Stress Concern Present (10/11/2019)   [employer] of Occupational Health - Occupational Stress Questionnaire    Feeling of Stress : Rather much  Social  Connections: Unknown (10/11/2019)   Social Connection and Isolation Panel [NHANES]    Frequency of Communication with Friends and Family: More than three times a week    Frequency of Social Gatherings with Friends and Family: More than three times a week    Attends Religious Services: Never    Database administrator or Organizations: No    Attends Banker Meetings: Not asked    Marital Status: Not on file  Intimate Partner Violence: Not At Risk (04/01/2023)   Humiliation, Afraid, Rape, and Kick questionnaire    Fear of Current or Ex-Partner: No    Emotionally Abused: No    Physically Abused: No    Sexually Abused: No    Allergies:  Allergies  Allergen Reactions   Vicodin [Hydrocodone-Acetaminophen] Rash   Amoxicillin Rash and Other (See Comments)    Has patient had a PCN reaction causing immediate rash, facial/tongue/throat swelling, SOB or lightheadedness with hypotension: Yes Has patient had a PCN reaction causing severe rash involving mucus membranes or skin necrosis: No Has patient had a PCN reaction that required hospitalization: No Has patient had a PCN reaction occurring within the last 10 years: No If all of the above answers are "NO",  then may proceed with Cephalosporin use.    Gabapentin Rash    Current Medications: Current Outpatient Medications  Medication Sig Dispense Refill   levothyroxine (SYNTHROID) 137 MCG tablet Take 1 tablet (137 mcg total) by mouth daily before breakfast. D/C 150 mcg 90 tablet 1   levothyroxine (SYNTHROID) 150 MCG tablet Take 150 mcg by mouth daily.     nitroGLYCERIN (NITROLINGUAL) 0.4 MG/SPRAY spray Place 1 spray under the tongue every 5 (five) minutes x 3 doses as needed for chest pain. 12 g 2   omeprazole (PRILOSEC) 40 MG capsule TAKE 1 CAPSULE (40 MG TOTAL) BY MOUTH DAILY. 90 capsule 0   oxyCODONE (OXYCONTIN) 15 mg 12 hr tablet 15 mg every 12 (twelve) hours.     Oxycodone HCl 10 MG TABS Take 10 mg by mouth 4 (four) times daily as needed.     ZTLIDO 1.8 % PTCH Apply 1 patch topically at bedtime.     aspirin EC 81 MG tablet Take 1 tablet (81 mg total) by mouth daily. Swallow whole. (Patient not taking: Reported on 08/11/2023) 360 tablet 0   rosuvastatin (CRESTOR) 5 MG tablet Take 5 mg by mouth daily. (Patient not taking: Reported on 08/11/2023)     No current facility-administered medications for this visit.     Physical Exam: Blood pressure (!) 142/83, pulse 64, temperature 97.9 F (36.6 C), temperature source Tympanic, resp. rate 18, weight 210 lb 12.8 oz (95.6 kg), SpO2 98%.   Physical Exam Constitutional:      General: She is not in acute distress.    Appearance: She is not diaphoretic.  HENT:     Head: Normocephalic and atraumatic.     Nose: Nose normal.     Mouth/Throat:     Pharynx: No oropharyngeal exudate.  Eyes:     General: No scleral icterus.    Pupils: Pupils are equal, round, and reactive to light.  Cardiovascular:     Rate and Rhythm: Normal rate and regular rhythm.     Heart sounds: No murmur heard. Pulmonary:     Effort: Pulmonary effort is normal. No respiratory distress.     Breath sounds: Normal breath sounds.  Abdominal:     General: Bowel sounds are  normal.  Palpations: Abdomen is soft.     Comments: Splenomegaly  Musculoskeletal:        General: Normal range of motion.     Cervical back: Normal range of motion and neck supple.  Skin:    General: Skin is warm and dry.     Findings: No erythema.  Neurological:     Mental Status: She is alert and oriented to person, place, and time.     Cranial Nerves: No cranial nerve deficit.     Motor: No abnormal muscle tone.     Coordination: Coordination normal.  Psychiatric:        Mood and Affect: Affect normal.    Labs    Latest Ref Rng & Units 09/08/2023    2:38 PM 08/11/2023    4:07 PM 07/09/2023   10:29 AM  CBC  WBC 4.0 - 10.5 K/uL 2.0  1.9 Repeated and verified X2.  1.3   Hemoglobin 12.0 - 15.0 g/dL 78.2  95.6  21.3   Hematocrit 36.0 - 46.0 % 36.5  34.4  31.8   Platelets 150 - 400 K/uL 38  45.0 Repeated and verified X2.  C 44     C Corrected result      Latest Ref Rng & Units 08/11/2023    4:07 PM 07/09/2023   10:29 AM 03/31/2023    9:25 PM  CMP  Glucose 70 - 99 mg/dL 86  086  578   BUN 6 - 23 mg/dL 9  11  9    Creatinine 0.40 - 1.20 mg/dL 4.69  6.29  5.28   Sodium 135 - 145 mEq/L 142  139  141   Potassium 3.5 - 5.1 mEq/L 4.3  3.5  3.6   Chloride 96 - 112 mEq/L 110  109  112   CO2 19 - 32 mEq/L 26  25  25    Calcium 8.4 - 10.5 mg/dL 9.3  8.4  8.8   Total Protein 6.0 - 8.3 g/dL 6.5  6.4  6.5   Total Bilirubin 0.2 - 1.2 mg/dL 1.0  0.8  1.2   Alkaline Phos 39 - 117 U/L 95  110  113   AST 0 - 37 U/L 29  33  47   ALT 0 - 35 U/L 17  20  24     Lab Results  Component Value Date   IRON 133 09/08/2023   TIBC 393 09/08/2023   FERRITIN 32 09/08/2023     RADIOGRAPHIC STUDIES: I have personally reviewed the radiological images as listed and agreed with the findings in the report. No results found.

## 2023-09-08 NOTE — Assessment & Plan Note (Addendum)
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 There is plan for patient to get upper endoscopy. Recommend Avatrombopag 60 mg once daily for 5 consecutive days, start 10 to 13 days prior to the scheduled procedure Will check insurance approval.

## 2023-09-08 NOTE — Assessment & Plan Note (Signed)
Lab Results  Component Value Date   HGB 12.0 09/08/2023   TIBC 393 09/08/2023   IRONPCTSAT 34 (H) 09/08/2023   FERRITIN 32 09/08/2023  Hemoglobin has improved. monitor

## 2023-09-08 NOTE — Assessment & Plan Note (Signed)
ANC has improved as B12 level is improved.

## 2023-09-08 NOTE — Assessment & Plan Note (Signed)
Recommend B12 sublingual supplementation. Rx sent to pharmacy.

## 2023-09-08 NOTE — Telephone Encounter (Signed)
Called and informed pt of overdue mammogram notification and that I could order her mammogram where ever she felt comfortable she stated she would like [place] orders have been placed and pt stated she has their number and will call

## 2023-09-09 ENCOUNTER — Telehealth: Payer: Self-pay

## 2023-09-09 ENCOUNTER — Other Ambulatory Visit: Payer: Self-pay

## 2023-09-09 ENCOUNTER — Telehealth: Payer: Self-pay | Admitting: Pharmacist

## 2023-09-09 ENCOUNTER — Other Ambulatory Visit (HOSPITAL_COMMUNITY): Payer: Self-pay

## 2023-09-09 DIAGNOSIS — D696 Thrombocytopenia, unspecified: Secondary | ICD-10-CM

## 2023-09-09 MED ORDER — AVATROMBOPAG MALEATE 20 MG PO TABS
60.0000 mg | ORAL_TABLET | Freq: Every day | ORAL | 0 refills | Status: DC
  Filled 2023-09-09: qty 15, 5d supply, fill #0

## 2023-09-09 NOTE — Progress Notes (Signed)
Patient education documented in EPIC note on 09/09/23.   No follow-up added as patient will only be taking medication for 5 days.

## 2023-09-09 NOTE — Telephone Encounter (Signed)
Oral Oncology Patient Advocate Encounter  Prior Authorization for Doptelet has been approved.    PA# [id number]  Effective dates: 09/09/23 through 09/08/24  Patients co-pay is $4.00.    Ardeen Fillers, CPhT Oncology Pharmacy Patient Advocate  [facility]  [phone] (phone) [phone] (fax) 09/09/2023 11:08 AM

## 2023-09-09 NOTE — Telephone Encounter (Signed)
Clinical Pharmacist Practitioner Encounter   Received new prescription for Doptelet (avatrombopag) for the treatment of thrombocytopenia due to cirrhosis. She will avatrombopag for 5 days beginning 10 to 13 days prior to scheduled procedure.  CBC from 09/08/23 reviewed and Prescription dose and frequency assessed.   Current medication list in Epic reviewed, no DDIs with avatrombopag identified.  Evaluated chart and no patient barriers to medication adherence identified.   Prescription has been e-scribed to the [facility] for benefits analysis and approval.  Oral Oncology Clinic will continue to follow for insurance authorization, copayment issues, initial counseling and start date.   [name], PharmD, BCPS, BCOP, CPP Hematology/Oncology Clinical Pharmacist Practitioner [place] [phone]  09/09/2023 1:53 PM

## 2023-09-09 NOTE — Telephone Encounter (Signed)
Looks like medication has been approved by insurance. By Prior authorization telephone call today

## 2023-09-09 NOTE — Telephone Encounter (Signed)
Patient successfully OnBoarded and drug education provided by pharmacist. Medication scheduled to be shipped on Monday, 09/13/23 for delivery on Tuesday, 09/14/23 from [facility] to patient's address. Patient also knows to call me at [phone] with any questions or concerns regarding receiving medication or if there is any unexpected change in co-pay.    Ardeen Fillers, CPhT Oncology Pharmacy Patient Advocate  [facility]  [phone] (phone) [phone] (fax) 09/09/2023 4:18 PM

## 2023-09-09 NOTE — Progress Notes (Signed)
Specialty Pharmacy Initial Fill Coordination Note  Brandi Bright is a 52 y.o. female contacted today regarding refills of specialty medication(s) Avatrombopag Maleate .  Patient requested Delivery  on 09/14/23 to verified address [address] [place], [place] [address]   Medication will be filled on 09/13/23.   Patient is aware of $4.00 copayment. Patient requested bill to [facility].

## 2023-09-09 NOTE — Telephone Encounter (Signed)
She will have to have EGD on 09/27/2023.  That if she starts the medication on 09/15/2023. That will be the only day available in the time frame. There are no openings at this time the Friday before on 09/24/2023.

## 2023-09-09 NOTE — Telephone Encounter (Signed)
-----   Message from [name] sent at 09/09/2023  4:17 PM EDT ----- Elvina Sidle everyone! She will have medication delivered on 09/14/23 and begin taking the avatrombopag that day.  Gretta Cool ----- Message ----- From: Toney Reil, MD Sent: 09/09/2023   1:37 PM EDT To: [name], RPH-CPP; Rickard Patience, MD; #  Alyson  Please let us know once approved by insurance and when she is about to start the medication.  Will plan her EGD accordingly  Here is the protocol  Begin avatrombopag 10 to 13 days prior to the scheduled procedure. Patients should undergo procedure 5 to 8 days after the last avatrombopag dose. Obtain a platelet count prior to therapy administration and on the day of the procedure (to ensure adequate increase in platelet count). Platelet count 40,000 to <50,000/mm3: 40 mg once daily for 5 consecutive days Platelet count <40,000/mm3: 60 mg once daily for 5 consecutive days  Morrie Sheldon, please make a note of her  Thanks all RV ----- Message ----- From: [name], RPH-CPP Sent: 09/09/2023   9:26 AM EDT To: Toney Reil, MD; Rickard Patience, MD; #  We will get to work on it!  Nadara Mode ----- Message ----- From: Rickard Patience, MD Sent: 09/08/2023   9:11 PM EDT To: Toney Reil, MD; #  Dr. Allegra Lai,  Her ANC has improved after B12 injections. Ok to proceed with EGD.  plan Avatrombopag, plan to start 10-13 days prior to EGD.   Alyson, would you please check insurance approval?   Thank you

## 2023-09-09 NOTE — Telephone Encounter (Signed)
-----   Message from [facility] sent at 09/09/2023  1:35 PM EDT ----- Nadara Mode  Please let us know once approved by insurance and when she is about to start the medication.  Will plan her EGD accordingly  Here is the protocol  Begin avatrombopag 10 to 13 days prior to the scheduled procedure. Patients should undergo procedure 5 to 8 days after the last avatrombopag dose. Obtain a platelet count prior to therapy administration and on the day of the procedure (to ensure adequate increase in platelet count). Platelet count 40,000 to <50,000/mm3: 40 mg once daily for 5 consecutive days Platelet count <40,000/mm3: 60 mg once daily for 5 consecutive days  Morrie Sheldon, please make a note of her  Thanks all RV ----- Message ----- From: [name], RPH-CPP Sent: 09/09/2023   9:26 AM EDT To: Toney Reil, MD; Rickard Patience, MD; #  We will get to work on it!  Nadara Mode ----- Message ----- From: Rickard Patience, MD Sent: 09/08/2023   9:11 PM EDT To: Toney Reil, MD; #  Dr. Allegra Lai,  Her ANC has improved after B12 injections. Ok to proceed with EGD.  plan Avatrombopag, plan to start 10-13 days prior to EGD.   Alyson, would you please check insurance approval?   Thank you

## 2023-09-09 NOTE — Telephone Encounter (Signed)
Clinical Pharmacist Practitioner Encounter   [facility] (Specialty) to deliver medication to patient on 09/14/23. She will begin taking her avatrombopag on 09/14/23.   Patient Education I spoke with patient for overview of new oral chemotherapy medication: Doptelet (avatrombopag) for the treatment of thrombocytopenia due to cirrhosis. She will avatrombopag for 5 days beginning 10 to 13 days prior to scheduled procedure   Counseled patient on administration, dosing, side effects, monitoring, drug-food interactions, safe handling, storage, and disposal. Patient will take 3 tablets (60 mg total) by mouth daily for 5 days. Take with food. Beginning 10 to 13 days prior to scheduled procedure.  Side effects include but not limited to: headache, fatigue.    Reviewed with patient importance of keeping a medication schedule and plan for any missed doses.  After discussion with patient no patient barriers to medication adherence identified.   Brandi Bright voiced understanding and appreciation. All questions answered. Medication handout provided.  Provided patient with Oral Chemotherapy Navigation Clinic phone number. Patient knows to call the office with questions or concerns. Oral Chemotherapy Navigation Clinic will continue to follow.  [name], PharmD, BCPS, BCOP, CPP Hematology/Oncology Clinical Pharmacist Practitioner [place] [phone]  09/09/2023 4:23 PM

## 2023-09-09 NOTE — Telephone Encounter (Signed)
Oral Oncology Patient Advocate Encounter  New authorization   Received notification that prior authorization for Doptelet is required.   PA submitted on 09/09/23  Key [id number]  Status is pending     Ardeen Fillers, CPhT Oncology Pharmacy Patient Advocate  [facility]  [phone] (phone) [phone] (fax) 09/09/2023 11:06 AM

## 2023-09-10 ENCOUNTER — Other Ambulatory Visit (HOSPITAL_COMMUNITY): Payer: Self-pay

## 2023-09-10 ENCOUNTER — Other Ambulatory Visit: Payer: Self-pay

## 2023-09-10 DIAGNOSIS — K7460 Unspecified cirrhosis of liver: Secondary | ICD-10-CM

## 2023-09-10 DIAGNOSIS — I8510 Secondary esophageal varices without bleeding: Secondary | ICD-10-CM

## 2023-09-10 MED ORDER — AVATROMBOPAG MALEATE 20 MG PO TABS
60.0000 mg | ORAL_TABLET | Freq: Every day | ORAL | 0 refills | Status: DC
  Filled 2023-09-10: qty 15, 5d supply, fill #0

## 2023-09-10 NOTE — Telephone Encounter (Signed)
Called and got patient schedule for 09/27/2023 with dr. Allegra Lai for a EGD. Went over instructions, mailed them and sent to [employer].

## 2023-09-13 ENCOUNTER — Other Ambulatory Visit: Payer: Self-pay

## 2023-09-13 ENCOUNTER — Encounter: Payer: Self-pay | Admitting: Oncology

## 2023-09-23 DIAGNOSIS — Z79899 Other long term (current) drug therapy: Secondary | ICD-10-CM | POA: Diagnosis not present

## 2023-09-23 DIAGNOSIS — M25569 Pain in unspecified knee: Secondary | ICD-10-CM | POA: Diagnosis not present

## 2023-09-23 DIAGNOSIS — Z79891 Long term (current) use of opiate analgesic: Secondary | ICD-10-CM | POA: Diagnosis not present

## 2023-09-23 DIAGNOSIS — M5451 Vertebrogenic low back pain: Secondary | ICD-10-CM | POA: Diagnosis not present

## 2023-09-23 DIAGNOSIS — G894 Chronic pain syndrome: Secondary | ICD-10-CM | POA: Diagnosis not present

## 2023-09-23 DIAGNOSIS — M542 Cervicalgia: Secondary | ICD-10-CM | POA: Diagnosis not present

## 2023-09-27 ENCOUNTER — Ambulatory Visit
Admission: RE | Admit: 2023-09-27 | Discharge: 2023-09-27 | Disposition: A | Discharge status: Home / Self Care | Payer: Medicaid Other | Attending: Gastroenterology | Admitting: Gastroenterology

## 2023-09-27 ENCOUNTER — Ambulatory Visit: Payer: Medicaid Other | Admitting: Anesthesiology

## 2023-09-27 ENCOUNTER — Encounter: Payer: Self-pay | Admitting: Gastroenterology

## 2023-09-27 ENCOUNTER — Encounter
Admission: RE | Disposition: A | Discharge status: Home / Self Care | Payer: Self-pay | Source: Home / Self Care | Attending: Gastroenterology

## 2023-09-27 DIAGNOSIS — K2289 Other specified disease of esophagus: Secondary | ICD-10-CM | POA: Insufficient documentation

## 2023-09-27 DIAGNOSIS — K7460 Unspecified cirrhosis of liver: Secondary | ICD-10-CM

## 2023-09-27 DIAGNOSIS — Z5986 Financial insecurity: Secondary | ICD-10-CM | POA: Diagnosis not present

## 2023-09-27 DIAGNOSIS — E785 Hyperlipidemia, unspecified: Secondary | ICD-10-CM | POA: Insufficient documentation

## 2023-09-27 DIAGNOSIS — K317 Polyp of stomach and duodenum: Secondary | ICD-10-CM | POA: Diagnosis present

## 2023-09-27 DIAGNOSIS — E039 Hypothyroidism, unspecified: Secondary | ICD-10-CM | POA: Diagnosis not present

## 2023-09-27 DIAGNOSIS — I10 Essential (primary) hypertension: Secondary | ICD-10-CM | POA: Diagnosis not present

## 2023-09-27 DIAGNOSIS — I8510 Secondary esophageal varices without bleeding: Secondary | ICD-10-CM | POA: Insufficient documentation

## 2023-09-27 DIAGNOSIS — K219 Gastro-esophageal reflux disease without esophagitis: Secondary | ICD-10-CM | POA: Insufficient documentation

## 2023-09-27 DIAGNOSIS — I2510 Atherosclerotic heart disease of native coronary artery without angina pectoris: Secondary | ICD-10-CM | POA: Diagnosis not present

## 2023-09-27 DIAGNOSIS — I8500 Esophageal varices without bleeding: Secondary | ICD-10-CM

## 2023-09-27 DIAGNOSIS — I252 Old myocardial infarction: Secondary | ICD-10-CM | POA: Diagnosis not present

## 2023-09-27 HISTORY — DX: Dyspnea, unspecified: R06.00

## 2023-09-27 HISTORY — PX: HEMOSTASIS CLIP PLACEMENT: SHX6857

## 2023-09-27 HISTORY — PX: ESOPHAGOGASTRODUODENOSCOPY (EGD) WITH PROPOFOL: SHX5813

## 2023-09-27 HISTORY — PX: POLYPECTOMY: SHX5525

## 2023-09-27 LAB — PLATELET COUNT: Platelets: 75 10*3/uL — ABNORMAL LOW (ref 150–400)

## 2023-09-27 SURGERY — ESOPHAGOGASTRODUODENOSCOPY (EGD) WITH PROPOFOL
Anesthesia: General

## 2023-09-27 MED ORDER — PROPOFOL 1000 MG/100ML IV EMUL
INTRAVENOUS | Status: AC
  Filled 2023-09-27: qty 100

## 2023-09-27 MED ORDER — DEXMEDETOMIDINE HCL IN NACL 80 MCG/20ML IV SOLN
INTRAVENOUS | Status: DC | PRN
Start: 2023-09-27 — End: 2023-09-27
  Administered 2023-09-27: 16 ug via INTRAVENOUS

## 2023-09-27 MED ORDER — LIDOCAINE HCL (CARDIAC) PF 100 MG/5ML IV SOSY
PREFILLED_SYRINGE | INTRAVENOUS | Status: DC | PRN
  Administered 2023-09-27: 100 mg via INTRAVENOUS

## 2023-09-27 MED ORDER — PROPOFOL 500 MG/50ML IV EMUL
INTRAVENOUS | Status: DC | PRN
  Administered 2023-09-27: 150 ug/kg/min via INTRAVENOUS

## 2023-09-27 MED ORDER — GLYCOPYRROLATE 0.2 MG/ML IJ SOLN
INTRAMUSCULAR | Status: DC | PRN
  Administered 2023-09-27: .2 mg via INTRAVENOUS

## 2023-09-27 MED ORDER — SODIUM CHLORIDE 0.9 % IV SOLN: INTRAVENOUS | Status: DC

## 2023-09-27 MED ORDER — SODIUM CHLORIDE 0.9 % IV SOLN
INTRAVENOUS | Status: DC | PRN
Start: 2023-09-27 — End: 2023-09-27

## 2023-09-27 MED ORDER — PROPOFOL 10 MG/ML IV BOLUS
INTRAVENOUS | Status: DC | PRN
  Administered 2023-09-27: 100 mg via INTRAVENOUS

## 2023-09-27 NOTE — Anesthesia Preprocedure Evaluation (Addendum)
Anesthesia Evaluation  Patient identified by MRN, date of birth, ID band Patient awake    Reviewed: Allergy & Precautions, NPO status , Patient's Chart, lab work & pertinent test results  History of Anesthesia Complications Negative for: history of anesthetic complications  Airway Mallampati: III   Neck ROM: Full    Dental  (+) Missing   Pulmonary neg pulmonary ROS   Pulmonary exam normal breath sounds clear to auscultation       Cardiovascular hypertension, + CAD (s/p MI)  Normal cardiovascular exam Rhythm:Regular Rate:Normal  Echo 04/01/23:   1. Left ventricular ejection fraction, by estimation, is 60 to 65%. The left ventricle has normal function. The left ventricle has no regional wall motion abnormalities. There is mild left ventricular hypertrophy. Left ventricular diastolic parameters are consistent with Grade I diastolic dysfunction (impaired relaxation).   2. Right ventricular systolic function is normal. The right ventricular size is normal. There is normal pulmonary artery systolic pressure. The estimated right ventricular systolic pressure is 30.0 mmHg.   3. The mitral valve is normal in structure. Mild mitral valve regurgitation. No evidence of mitral stenosis.   4. The aortic valve is tricuspid. Aortic valve regurgitation is not visualized. No aortic stenosis is present.   5. The inferior vena cava is normal in size with greater than 50% respiratory variability, suggesting right atrial pressure of 3 mmHg.     Neuro/Psych  PSYCHIATRIC DISORDERS Anxiety Depression    negative neurological ROS     GI/Hepatic ,GERD  ,,(+) Cirrhosis         Endo/Other  Class 3 obesity  Renal/GU      Musculoskeletal  (+) Arthritis ,  Gout    Abdominal   Peds  Hematology  (+) Blood dyscrasia (thrombocytopenia), anemia   Anesthesia Other Findings   Reproductive/Obstetrics                              Anesthesia Physical Anesthesia Plan  ASA: 3  Anesthesia Plan: General   Post-op Pain Management:    Induction: Intravenous  PONV Risk Score and Plan: 3 and Propofol infusion, TIVA and Treatment may vary due to age or medical condition  Airway Management Planned: Natural Airway  Additional Equipment:   Intra-op Plan:   Post-operative Plan:   Informed Consent: I have reviewed the patients History and Physical, chart, labs and discussed the procedure including the risks, benefits and alternatives for the proposed anesthesia with the patient or authorized representative who has indicated his/her understanding and acceptance.       Plan Discussed with: CRNA  Anesthesia Plan Comments: (LMA/GETA backup discussed.  Patient consented for risks of anesthesia including but not limited to:  - adverse reactions to medications - damage to eyes, teeth, lips or other oral mucosa - nerve damage due to positioning  - sore throat or hoarseness - damage to heart, brain, nerves, lungs, other parts of body or loss of life  Informed patient about role of CRNA in peri- and intra-operative care.  Patient voiced understanding.)        Anesthesia Quick Evaluation

## 2023-09-27 NOTE — Anesthesia Procedure Notes (Signed)
Date/Time: 09/27/2023 9:13 AM  Performed by: Stormy Fabian, CRNAPre-anesthesia Checklist: Patient identified, Emergency Drugs available, Suction available and Patient being monitored Patient Re-evaluated:Patient Re-evaluated prior to induction Oxygen Delivery Method: Simple face mask Induction Type: IV induction Dental Injury: Teeth and Oropharynx as per pre-operative assessment  Comments: Nasal cannula with etCO2 monitoring

## 2023-09-27 NOTE — Anesthesia Postprocedure Evaluation (Signed)
Anesthesia Post Note  Patient: Brandi Bright  Procedure(s) Performed: ESOPHAGOGASTRODUODENOSCOPY (EGD) WITH PROPOFOL HEMOSTASIS CLIP PLACEMENT POLYPECTOMY  Patient location during evaluation: PACU Anesthesia Type: General Level of consciousness: awake and alert, oriented and patient cooperative Pain management: pain level controlled Vital Signs Assessment: post-procedure vital signs reviewed and stable Respiratory status: spontaneous breathing, nonlabored ventilation and respiratory function stable Cardiovascular status: blood pressure returned to baseline and stable Postop Assessment: adequate PO intake Anesthetic complications: no   No notable events documented.   Last Vitals:  Vitals:   09/27/23 0941 09/27/23 0943  BP:  98/65  Pulse: 71 71  Resp:    Temp:    SpO2: 99% 99%    Last Pain:  Vitals:   09/27/23 0943  TempSrc:   PainSc: 0-No pain                 Reed Breech

## 2023-09-27 NOTE — H&P (Signed)
[name], MD [address]  [address]  [place], [place] [address]  Main: [phone]  Fax: [phone] Pager: [phone]  Primary Care Physician:  Bethanie Dicker, NP Primary Gastroenterologist:  Dr. [name]  Pre-Procedure History & Physical: HPI:  Brandi Bright is a 52 y.o. female is here for an endoscopy.   Past Medical History:  Diagnosis Date   Arthritis    Back pain    Chronic knee pain    Cirrhosis (HCC)    Dyspnea    Gout    Heart attack (HCC)    History of blood transfusion    HLD (hyperlipidemia)    Hypothyroidism    IDA (iron deficiency anemia) 01/16/2020   MI, old    Thyroid disease     Past Surgical History:  Procedure Laterality Date   CARDIAC CATHETERIZATION     CESAREAN SECTION     COLONOSCOPY WITH PROPOFOL N/A 03/16/2018   Procedure: COLONOSCOPY WITH PROPOFOL;  Surgeon: Pasty Spillers, MD;  Location: [facility] ENDOSCOPY;  Service: Endoscopy;  Laterality: N/A;   COLONOSCOPY WITH PROPOFOL N/A 07/09/2021   Procedure: COLONOSCOPY WITH PROPOFOL;  Surgeon: Pasty Spillers, MD;  Location: [facility] ENDOSCOPY;  Service: Endoscopy;  Laterality: N/A;   ESOPHAGOGASTRODUODENOSCOPY N/A 10/11/2019   Procedure: ESOPHAGOGASTRODUODENOSCOPY (EGD);  Surgeon: Toney Reil, MD;  Location: [facility] ENDOSCOPY;  Service: Gastroenterology;  Laterality: N/A;   ESOPHAGOGASTRODUODENOSCOPY (EGD) WITH PROPOFOL N/A 10/27/2018   Procedure: ESOPHAGOGASTRODUODENOSCOPY (EGD) WITH PROPOFOL;  Surgeon: Pasty Spillers, MD;  Location: [facility] ENDOSCOPY;  Service: Endoscopy;  Laterality: N/A;   ESOPHAGOGASTRODUODENOSCOPY (EGD) WITH PROPOFOL N/A 06/25/2019   Procedure: ESOPHAGOGASTRODUODENOSCOPY (EGD) WITH PROPOFOL;  Surgeon: Wyline Mood, MD;  Location: [facility] ENDOSCOPY;  Service: Gastroenterology;  Laterality: N/A;   ESOPHAGOGASTRODUODENOSCOPY (EGD) WITH PROPOFOL N/A 09/12/2019   Procedure: ESOPHAGOGASTRODUODENOSCOPY (EGD) WITH PROPOFOL;  Surgeon: Midge Minium, MD;  Location:  [facility] ENDOSCOPY;  Service: Endoscopy;  Laterality: N/A;   ESOPHAGOGASTRODUODENOSCOPY (EGD) WITH PROPOFOL N/A 11/20/2019   Procedure: ESOPHAGOGASTRODUODENOSCOPY (EGD) WITH PROPOFOL;  Surgeon: Pasty Spillers, MD;  Location: [facility] ENDOSCOPY;  Service: Endoscopy;  Laterality: N/A;   ESOPHAGOGASTRODUODENOSCOPY (EGD) WITH PROPOFOL N/A 11/23/2019   Procedure: ESOPHAGOGASTRODUODENOSCOPY (EGD) WITH PROPOFOL;  Surgeon: Pasty Spillers, MD;  Location: [facility] ENDOSCOPY;  Service: Endoscopy;  Laterality: N/A;   ESOPHAGOGASTRODUODENOSCOPY (EGD) WITH PROPOFOL N/A 01/03/2020   Procedure: ESOPHAGOGASTRODUODENOSCOPY (EGD) WITH PROPOFOL;  Surgeon: Toney Reil, MD;  Location: [facility] ENDOSCOPY;  Service: Endoscopy;  Laterality: N/A;   ESOPHAGOGASTRODUODENOSCOPY (EGD) WITH PROPOFOL N/A 07/09/2021   Procedure: ESOPHAGOGASTRODUODENOSCOPY (EGD) WITH PROPOFOL;  Surgeon: Pasty Spillers, MD;  Location: [facility] ENDOSCOPY;  Service: Endoscopy;  Laterality: N/A;   ESOPHAGOGASTRODUODENOSCOPY (EGD) WITH PROPOFOL N/A 05/17/2023   Procedure: ESOPHAGOGASTRODUODENOSCOPY (EGD) WITH PROPOFOL;  Surgeon: Toney Reil, MD;  Location: [facility] ENDOSCOPY;  Service: Gastroenterology;  Laterality: N/A;   TUBAL LIGATION      Prior to Admission medications   Medication Sig Start Date End Date Taking? Authorizing Provider  levothyroxine (SYNTHROID) 137 MCG tablet Take 1 tablet (137 mcg total) by mouth daily before breakfast. D/C 150 mcg 08/12/23  Yes Bethanie Dicker, NP  omeprazole (PRILOSEC) 40 MG capsule TAKE 1 CAPSULE (40 MG TOTAL) BY MOUTH DAILY. 07/26/23  Yes Jeoffrey Eleazer, Loel Dubonnet, MD  Oxycodone HCl 10 MG TABS Take 10 mg by mouth 4 (four) times daily as needed. 10/14/20  Yes [provider]  aspirin EC 81 MG tablet Take 1 tablet (81 mg total) by mouth daily. Swallow whole. Patient not  taking: Reported on 08/11/2023 04/01/23 03/31/24  Lucile Shutters, MD  avatrombopag maleate (DOPTELET) 20 MG tablet Take 3 tablets (60 mg  total) by mouth daily. Take for 5 days. Take with food. Beginning 10 to 13 days prior to scheduled procedure. Patient not taking: Reported on 09/20/2023 09/10/23   Rickard Patience, MD  Cyanocobalamin (B-12) 2500 MCG SUBL Place 1 tablet under the tongue daily. 09/08/23   Rickard Patience, MD  levothyroxine (SYNTHROID) 150 MCG tablet Take 150 mcg by mouth daily. Patient not taking: Reported on 09/27/2023 09/03/23   [provider]  nitroGLYCERIN (NITROLINGUAL) 0.4 MG/SPRAY spray Place 1 spray under the tongue every 5 (five) minutes x 3 doses as needed for chest pain. 04/24/20   Corky Downs, MD  oxyCODONE (OXYCONTIN) 15 mg 12 hr tablet 15 mg every 12 (twelve) hours. 06/09/22   [provider]  rosuvastatin (CRESTOR) 5 MG tablet Take 5 mg by mouth daily. Patient not taking: Reported on 08/11/2023    [provider]  ZTLIDO 1.8 % PTCH Apply 1 patch topically at bedtime. 07/10/22   [provider]    Allergies as of 09/10/2023 - Review Complete 09/08/2023  Allergen Reaction Noted   Vicodin [hydrocodone-acetaminophen] Rash 01/11/2016   Amoxicillin Rash and Other (See Comments) 01/11/2016   Gabapentin Rash 01/11/2016    Family History  Problem Relation Age of Onset   Stroke Mother    Hypertension Mother    Hyperlipidemia Mother    Diabetes Mother    COPD Mother    Cancer Mother    Arthritis Mother    Hypertension Father    COPD Father    Cancer Father    CAD Father    Colon cancer Father    Lung cancer Father    Heart attack Father    Stroke Sister    Kidney disease Sister    Hypertension Sister    Hyperlipidemia Sister    Diabetes Sister    COPD Sister    Arthritis Sister    Hyperlipidemia Sister    Arthritis Sister    Stroke Sister    Hypertension Sister    Hyperlipidemia Sister    Heart disease Sister    Heart attack Sister    Diabetes Sister    COPD Sister    Asthma Sister    Arthritis Sister    Stroke Brother    Hyperlipidemia Brother    COPD Brother     Arthritis Brother    Pancreatic cancer Brother    Arthritis Maternal Grandmother    Arthritis Maternal Grandfather    Arthritis Paternal Grandmother    Arthritis Paternal Grandfather    Hypertension Other     Social History   Socioeconomic History   Marital status: Single    Spouse name: Not on file   Number of children: Not on file   Years of education: Not on file   Highest education level: Not on file  Occupational History   Not on file  Tobacco Use   Smoking status: Never   Smokeless tobacco: Never  Vaping Use   Vaping status: Never Used  Substance and Sexual Activity   Alcohol use: Yes    Comment: occ   Drug use: Yes    Comment: prescribed oxycodone and oxycotin   Sexual activity: Not Currently  Other Topics Concern   Not on file  Social History Narrative   Not on file   Social Determinants of Health   Financial Resource Strain: Medium Risk (10/11/2019)  Overall Financial Resource Strain (CARDIA)    Difficulty of Paying Living Expenses: Somewhat hard  Food Insecurity: No Food Insecurity (04/01/2023)   Hunger Vital Sign    Worried About Running Out of Food in the Last Year: Never true    Ran Out of Food in the Last Year: Never true  Transportation Needs: No Transportation Needs (04/01/2023)   PRAPARE - Administrator, Civil Service (Medical): No    Lack of Transportation (Non-Medical): No  Physical Activity: Inactive (10/11/2019)   Exercise Vital Sign    Days of Exercise per Week: 0 days    Minutes of Exercise per Session: 0 min  Stress: Stress Concern Present (10/11/2019)   [employer] of Occupational Health - Occupational Stress Questionnaire    Feeling of Stress : Rather much  Social Connections: Unknown (10/11/2019)   Social Connection and Isolation Panel [NHANES]    Frequency of Communication with Friends and Family: More than three times a week    Frequency of Social Gatherings with Friends and Family: More than three times a  week    Attends Religious Services: Never    Database administrator or Organizations: No    Attends Engineer, structural: Not asked    Marital Status: Not on file  Intimate Partner Violence: Not At Risk (04/01/2023)   Humiliation, Afraid, Rape, and Kick questionnaire    Fear of Current or Ex-Partner: No    Emotionally Abused: No    Physically Abused: No    Sexually Abused: No    Review of Systems: See HPI, otherwise negative ROS  Physical Exam: BP (!) 143/84   Pulse 66   Temp (!) 96.6 F (35.9 C) (Temporal)   Resp 18   Ht 4\' 9"  (1.448 m)   Wt 95.7 kg   LMP  (LMP Unknown) Comment: last menstrual 9 years ago, tubal ligation  SpO2 95%   BMI 45.66 kg/m  General:   Alert,  pleasant and cooperative in NAD Head:  Normocephalic and atraumatic. Neck:  Supple; no masses or thyromegaly. Lungs:  Clear throughout to auscultation.    Heart:  Regular rate and rhythm. Abdomen:  Soft, nontender and nondistended. Normal bowel sounds, without guarding, and without rebound.   Neurologic:  Alert and  oriented x4;  grossly normal neurologically.  Impression/Plan: Sharol Given is here for an endoscopy to be performed for variceal surveillance  Risks, benefits, limitations, and alternatives regarding  endoscopy have been reviewed with the patient.  Questions have been answered.  All parties agreeable.   Lannette Donath, MD  09/27/2023, 8:58 AM

## 2023-09-27 NOTE — Op Note (Signed)
[facility] Gastroenterology Patient Name: Brandi Bright Procedure Date: 09/27/2023 9:00 AM MRN: [id number] Account #: [id number] Date of Birth: 1971/11/04 Admit Type: Outpatient Age: 52 Room: [facility] ENDO ROOM 4 Gender: Female Note Status: Finalized Instrument Name: Upper Endoscope [id number] Procedure:             Upper GI endoscopy Indications:           Esophageal varices, Follow-up of esophageal varices Providers:             Toney Reil MD, MD Referring MD:          Bethanie Dicker (Referring MD) Medicines:             General Anesthesia Complications:         No immediate complications. Estimated blood loss: None. Procedure:             Pre-Anesthesia Assessment:                        - Prior to the procedure, a History and Physical was                         performed, and patient medications and allergies were                         reviewed. The patient is competent. The risks and                         benefits of the procedure and the sedation options and                         risks were discussed with the patient. All questions                         were answered and informed consent was obtained.                         Patient identification and proposed procedure were                         verified by the physician, the nurse, the                         anesthesiologist, the anesthetist and the technician                         in the pre-procedure area in the procedure room in the                         endoscopy suite. Mental Status Examination: alert and                         oriented. Airway Examination: normal oropharyngeal                         airway and neck mobility. Respiratory Examination:                         clear to auscultation. CV Examination: normal.  Prophylactic Antibiotics: The patient does not require                         prophylactic antibiotics. Prior Anticoagulants: The                          patient has taken no anticoagulant or antiplatelet                         agents. ASA Grade Assessment: III - A patient with                         severe systemic disease. After reviewing the risks and                         benefits, the patient was deemed in satisfactory                         condition to undergo the procedure. The anesthesia                         plan was to use general anesthesia. Immediately prior                         to administration of medications, the patient was                         re-assessed for adequacy to receive sedatives. The                         heart rate, respiratory rate, oxygen saturations,                         blood pressure, adequacy of pulmonary ventilation, and                         response to care were monitored throughout the                         procedure. The physical status of the patient was                         re-assessed after the procedure.                        After obtaining informed consent, the endoscope was                         passed under direct vision. Throughout the procedure,                         the patient's blood pressure, pulse, and oxygen                         saturations were monitored continuously. The Endoscope                         was introduced through the mouth, and advanced to the  second part of duodenum. The upper GI endoscopy was                         accomplished without difficulty. The patient tolerated                         the procedure well. Findings:      The duodenal bulb and second portion of the duodenum were normal.      A single 15 mm sessile polyp with no bleeding and stigmata of recent       bleeding was found in the gastric antrum. The polyp was removed with a       hot snare. Resection and retrieval were complete using a Roth net. One       hemostatic clip was successfully placed (MR safe). Estimated blood loss:        none.      Multiple diminutive sessile polyps with no bleeding and stigmata of       recent bleeding were found in the entire examined stomach.      The cardia and gastric fundus were normal on retroflexion.      A post variceal banding scar was found in the lower third of the       esophagus, 30 cm from the incisors. There were no residual varices since       last EGD and banding Impression:            - Normal duodenal bulb and second portion of the                         duodenum.                        - A single gastric polyp. Resected and retrieved. Clip                         (MR safe) was placed.                        - Multiple gastric polyps.                        - Scar in the lower third of the esophagus. Recommendation:        - Discharge patient to home (with escort).                        - Cardiac diet, diabetic (ADA) diet and low sodium                         diet today.                        - Continue present medications.                        - Return to my office as previously scheduled. Procedure Code(s):     --- Professional ---                        [phone], Esophagogastroduodenoscopy, flexible,  transoral; with removal of tumor(s), polyp(s), or                         other lesion(s) by snare technique Diagnosis Code(s):     --- Professional ---                        K31.7, Polyp of stomach and duodenum                        K22.89, Other specified disease of esophagus                        I85.00, Esophageal varices without bleeding CPT copyright 2022 American Medical Association. All rights reserved. The codes documented in this report are preliminary and upon coder review may  be revised to meet current compliance requirements. Dr. Libby Maw Toney Reil MD, MD 09/27/2023 9:25:29 AM This report has been signed electronically. Number of Addenda: 0 Note Initiated On: 09/27/2023 9:00 AM Estimated Blood Loss:   Estimated blood loss: none.      [facility]

## 2023-09-27 NOTE — Transfer of Care (Signed)
Immediate Anesthesia Transfer of Care Note  Patient: Brandi Bright  Procedure(s) Performed: ESOPHAGOGASTRODUODENOSCOPY (EGD) WITH PROPOFOL HEMOSTASIS CLIP PLACEMENT POLYPECTOMY  Patient Location: PACU and Endoscopy Unit  Anesthesia Type:General  Level of Consciousness: awake  Airway & Oxygen Therapy: Patient Spontanous Breathing  Post-op Assessment: Report Bright to RN and Post -op Vital signs reviewed and stable  Post vital signs: Reviewed and stable  Last Vitals:  Vitals Value Taken Time  BP 77/46 09/27/23 0926  Temp 36.2 C 09/27/23 0923  Pulse 77 09/27/23 0927  Resp 27 09/27/23 0927  SpO2 93 % 09/27/23 0927  Vitals shown include unfiled device data.  Last Pain:  Vitals:   09/27/23 0923  TempSrc: Temporal  PainSc: Asleep         Complications: No notable events documented.

## 2023-09-28 ENCOUNTER — Encounter: Payer: Self-pay | Admitting: Gastroenterology

## 2023-09-29 LAB — SURGICAL PATHOLOGY

## 2023-09-30 ENCOUNTER — Encounter: Payer: Self-pay | Admitting: Gastroenterology

## 2023-10-04 ENCOUNTER — Encounter: Payer: Self-pay | Admitting: Gastroenterology

## 2023-10-05 ENCOUNTER — Telehealth: Payer: Self-pay

## 2023-10-05 DIAGNOSIS — K7460 Unspecified cirrhosis of liver: Secondary | ICD-10-CM

## 2023-10-05 NOTE — Telephone Encounter (Signed)
-----   Message from [facility] sent at 05/17/2023  3:35 PM EDT ----- [facility] screening with repeat ultrasound liver in 6 months

## 2023-10-05 NOTE — Telephone Encounter (Signed)
Order the RUQ ultrasound. Patient last Ultrasound was 05/14/2023 so the repeat one needs to be around 11/14/2023. Her follow up appointment with Dr. Allegra Lai is 11/15/2023 . Got patient schedule 11/12/2023 [place] medical mall at 7:45am for a 8:00am scan. Nothing to eat or drink after midnight.  Called patient and left a message for call back

## 2023-10-05 NOTE — Telephone Encounter (Signed)
Called patient and patient verbalized understanding of instructions  ?

## 2023-10-28 ENCOUNTER — Other Ambulatory Visit: Payer: Self-pay

## 2023-11-10 ENCOUNTER — Other Ambulatory Visit: Payer: Self-pay

## 2023-11-12 ENCOUNTER — Ambulatory Visit: Payer: Medicaid Other

## 2023-11-15 ENCOUNTER — Encounter: Payer: Self-pay | Admitting: Oncology

## 2023-11-15 ENCOUNTER — Other Ambulatory Visit: Payer: Self-pay

## 2023-11-15 ENCOUNTER — Encounter: Payer: Self-pay | Admitting: Gastroenterology

## 2023-11-15 ENCOUNTER — Ambulatory Visit: Payer: Medicaid Other | Admitting: Gastroenterology

## 2023-11-15 ENCOUNTER — Other Ambulatory Visit: Payer: Self-pay | Admitting: *Deleted

## 2023-11-15 ENCOUNTER — Ambulatory Visit
Admission: RE | Admit: 2023-11-15 | Discharge: 2023-11-15 | Disposition: A | Discharge status: Home / Self Care | Payer: Self-pay | Source: Ambulatory Visit | Attending: Nurse Practitioner | Admitting: Nurse Practitioner

## 2023-11-15 VITALS — BP 144/90 | HR 69 | Temp 98.1°F | Ht <= 58 in | Wt 215.1 lb

## 2023-11-15 DIAGNOSIS — I8510 Secondary esophageal varices without bleeding: Secondary | ICD-10-CM

## 2023-11-15 DIAGNOSIS — K7460 Unspecified cirrhosis of liver: Secondary | ICD-10-CM | POA: Diagnosis not present

## 2023-11-15 DIAGNOSIS — Z1231 Encounter for screening mammogram for malignant neoplasm of breast: Secondary | ICD-10-CM

## 2023-11-15 DIAGNOSIS — Z8619 Personal history of other infectious and parasitic diseases: Secondary | ICD-10-CM

## 2023-11-15 DIAGNOSIS — R2243 Localized swelling, mass and lump, lower limb, bilateral: Secondary | ICD-10-CM

## 2023-11-15 DIAGNOSIS — K766 Portal hypertension: Secondary | ICD-10-CM

## 2023-11-15 DIAGNOSIS — D696 Thrombocytopenia, unspecified: Secondary | ICD-10-CM

## 2023-11-15 DIAGNOSIS — E8770 Fluid overload, unspecified: Secondary | ICD-10-CM | POA: Diagnosis not present

## 2023-11-15 DIAGNOSIS — Z862 Personal history of diseases of the blood and blood-forming organs and certain disorders involving the immune mechanism: Secondary | ICD-10-CM | POA: Diagnosis not present

## 2023-11-15 MED ORDER — FUROSEMIDE 20 MG PO TABS: 20.0000 mg | ORAL_TABLET | Freq: Every day | ORAL | 2 refills | Status: DC

## 2023-11-15 MED ORDER — SPIRONOLACTONE 50 MG PO TABS: 50.0000 mg | ORAL_TABLET | Freq: Every day | ORAL | 2 refills | Status: DC

## 2023-11-15 NOTE — Progress Notes (Signed)
[name], MD [address]  [address]  [place], [place] [address]  Main: [phone]  Fax: [phone]    Gastroenterology Consultation  Referring Provider:     Corky Downs, MD Primary Care Physician:  Bethanie Dicker, NP Primary Gastroenterologist:  Dr. [name] Reason for Consultation: Cirrhosis of liver        HPI:   Brandi Bright is a 52 y.o. female referred by Dr. Bethanie Dicker, NP  for consultation & management of cirrhosis of liver.  Patient was previously followed by Dr. Maximino Greenland for management of liver cirrhosis.  History of metabolic syndrome, chronic hep C, status posttreatment, achieved SVR, hypothyroidism.  With regards to cirrhosis, she had history of esophageal varices s/p ligation in the past.  Patient denies any rectal bleeding, abdominal distention or swelling of legs.  Follow-up visit 11/15/23 Brandi Bright is here for follow-up of cirrhosis of liver, esophageal varices s/p banding, severe thrombocytopenia.  She has been doing well overall, concerned about ongoing weight gain even though she reports that she has been trying to eat healthy.  She does report swelling of legs over Thanksgiving as she spent time standing for longer hours while cooking.  She is not on any diuretics.  She does not follow strict low-sodium diet.  She denies any rectal bleeding, black stools, nausea, vomiting, abdominal pain, melena or hematemesis.  She does report intermittent heartburn, taking omeprazole 40 mg daily  Upper endoscopy 09/27/2023 - Normal duodenal bulb and second portion of the duodenum. - A single gastric polyp. Resected and retrieved. Clip ( MR safe) was placed. - Multiple gastric polyps. - Scar in the lower third of the esophagus. 1. Stomach, polyp(s), Antrum hot snare :       - HYPERPLASTIC POLYP, ACUTELY INFLAMED       - NEGATIVE FOR  MALIGNANCY Upper endoscopy 05/17/2023 - Normal duodenal bulb and second portion of the duodenum. - A single gastric polyp. -  Large ( > 5 mm) esophageal varices with no bleeding and no stigmata of recent bleeding. Completely eradicated. Banded. - No specimens collected.  Upper endoscopy July 2022 Impression:            - Grade I esophageal varices with no bleeding and no                         stigmata of recent bleeding.                        - A single gastric polyp. Biopsied.                        - Multiple gastric polyps. Biopsied.                        - Normal examined duodenum.   Colonoscopy Impression:            - Hemorrhoids found on perianal exam.                        - One 3 mm polyp in the cecum, removed with a cold                         biopsy forceps. Resected and retrieved.                        -  One 5 mm polyp in the transverse colon, removed with                         a cold snare. Resected and retrieved.                        - Two 3 to 4 mm polyps in the rectum and in the                         sigmoid colon, removed with a cold biopsy forceps.                         Resected and retrieved.                        - The examination was otherwise normal.                        - The rectum, sigmoid colon, descending colon,                         transverse colon, ascending colon and cecum are normal.                        - The distal rectum and anal verge are normal on                         retroflexion view.   DIAGNOSIS:  A. STOMACH POLYP, ANTRUM; COLD BIOPSY:  - HYPERPLASTIC POLYP, ACUTELY INFLAMED.  - NEGATIVE FOR DYSPLASIA AND MALIGNANCY.   Comment:  Immunohistochemical stain for H pylori will be reported in an addendum.   B. STOMACH POLYP; COLD BIOPSY:  - HYPERPLASTIC POLYP.  - NEGATIVE FOR INTESTINAL METAPLASIA, DYSPLASIA, AND MALIGNANCY.   C. COLON POLYP, CECUM; COLD BIOPSY:  - TUBULAR ADENOMA.  - NEGATIVE FOR HIGH GRADE DYSPLASIA AND MALIGNANCY.   D. COLON POLYP, TRANSVERSE; COLD SNARE:  - TUBULAR ADENOMA.  - NEGATIVE FOR HIGH GRADE DYSPLASIA AND  MALIGNANCY.   E. COLON POLYP X2, SIGMOID AND RECTUM; COLD BIOPSY:  - HYPERPLASTIC POLYP, TWO FRAGMENTS.  - NEGATIVE FOR DYSPLASIA AND MALIGNANCY.    Antiplts/Anticoagulants/Anti thrombotics: None   Past Medical History:  Diagnosis Date   Arthritis    Back pain    Chronic knee pain    Cirrhosis (HCC)    Dyspnea    Gout    Heart attack (HCC)    History of blood transfusion    HLD (hyperlipidemia)    Hypothyroidism    IDA (iron deficiency anemia) 01/16/2020   MI, old    Thyroid disease     Past Surgical History:  Procedure Laterality Date   CARDIAC CATHETERIZATION     CESAREAN SECTION     COLONOSCOPY WITH PROPOFOL N/A 03/16/2018   Procedure: COLONOSCOPY WITH PROPOFOL;  Surgeon: Pasty Spillers, MD;  Location: [facility] ENDOSCOPY;  Service: Endoscopy;  Laterality: N/A;   COLONOSCOPY WITH PROPOFOL N/A 07/09/2021   Procedure: COLONOSCOPY WITH PROPOFOL;  Surgeon: Pasty Spillers, MD;  Location: [facility] ENDOSCOPY;  Service: Endoscopy;  Laterality: N/A;   ESOPHAGOGASTRODUODENOSCOPY N/A 10/11/2019   Procedure: ESOPHAGOGASTRODUODENOSCOPY (EGD);  Surgeon: Toney Reil, MD;  Location: [facility] ENDOSCOPY;  Service: Gastroenterology;  Laterality: N/A;   ESOPHAGOGASTRODUODENOSCOPY (  EGD) WITH PROPOFOL N/A 10/27/2018   Procedure: ESOPHAGOGASTRODUODENOSCOPY (EGD) WITH PROPOFOL;  Surgeon: Pasty Spillers, MD;  Location: [facility] ENDOSCOPY;  Service: Endoscopy;  Laterality: N/A;   ESOPHAGOGASTRODUODENOSCOPY (EGD) WITH PROPOFOL N/A 06/25/2019   Procedure: ESOPHAGOGASTRODUODENOSCOPY (EGD) WITH PROPOFOL;  Surgeon: Wyline Mood, MD;  Location: [facility] ENDOSCOPY;  Service: Gastroenterology;  Laterality: N/A;   ESOPHAGOGASTRODUODENOSCOPY (EGD) WITH PROPOFOL N/A 09/12/2019   Procedure: ESOPHAGOGASTRODUODENOSCOPY (EGD) WITH PROPOFOL;  Surgeon: Midge Minium, MD;  Location: [facility] ENDOSCOPY;  Service: Endoscopy;  Laterality: N/A;   ESOPHAGOGASTRODUODENOSCOPY (EGD) WITH PROPOFOL N/A 11/20/2019   Procedure:  ESOPHAGOGASTRODUODENOSCOPY (EGD) WITH PROPOFOL;  Surgeon: Pasty Spillers, MD;  Location: [facility] ENDOSCOPY;  Service: Endoscopy;  Laterality: N/A;   ESOPHAGOGASTRODUODENOSCOPY (EGD) WITH PROPOFOL N/A 11/23/2019   Procedure: ESOPHAGOGASTRODUODENOSCOPY (EGD) WITH PROPOFOL;  Surgeon: Pasty Spillers, MD;  Location: [facility] ENDOSCOPY;  Service: Endoscopy;  Laterality: N/A;   ESOPHAGOGASTRODUODENOSCOPY (EGD) WITH PROPOFOL N/A 01/03/2020   Procedure: ESOPHAGOGASTRODUODENOSCOPY (EGD) WITH PROPOFOL;  Surgeon: Toney Reil, MD;  Location: [facility] ENDOSCOPY;  Service: Endoscopy;  Laterality: N/A;   ESOPHAGOGASTRODUODENOSCOPY (EGD) WITH PROPOFOL N/A 07/09/2021   Procedure: ESOPHAGOGASTRODUODENOSCOPY (EGD) WITH PROPOFOL;  Surgeon: Pasty Spillers, MD;  Location: [facility] ENDOSCOPY;  Service: Endoscopy;  Laterality: N/A;   ESOPHAGOGASTRODUODENOSCOPY (EGD) WITH PROPOFOL N/A 05/17/2023   Procedure: ESOPHAGOGASTRODUODENOSCOPY (EGD) WITH PROPOFOL;  Surgeon: Toney Reil, MD;  Location: [facility] ENDOSCOPY;  Service: Gastroenterology;  Laterality: N/A;   ESOPHAGOGASTRODUODENOSCOPY (EGD) WITH PROPOFOL N/A 09/27/2023   Procedure: ESOPHAGOGASTRODUODENOSCOPY (EGD) WITH PROPOFOL;  Surgeon: Toney Reil, MD;  Location: [facility] ENDOSCOPY;  Service: Gastroenterology;  Laterality: N/A;   HEMOSTASIS CLIP PLACEMENT  09/27/2023   Procedure: HEMOSTASIS CLIP PLACEMENT;  Surgeon: Toney Reil, MD;  Location: [facility] ENDOSCOPY;  Service: Gastroenterology;;   POLYPECTOMY  09/27/2023   Procedure: POLYPECTOMY;  Surgeon: Toney Reil, MD;  Location: [facility] ENDOSCOPY;  Service: Gastroenterology;;   TUBAL LIGATION       Current Outpatient Medications:    Cyanocobalamin (B-12) 2500 MCG SUBL, Place 1 tablet under the tongue daily., Disp: 90 tablet, Rfl: 1   levothyroxine (SYNTHROID) 137 MCG tablet, Take 1 tablet (137 mcg total) by mouth daily before breakfast. D/C 150 mcg, Disp: 90 tablet, Rfl: 1   levothyroxine  (SYNTHROID) 150 MCG tablet, Take 150 mcg by mouth daily., Disp: , Rfl:    nitroGLYCERIN (NITROLINGUAL) 0.4 MG/SPRAY spray, Place 1 spray under the tongue every 5 (five) minutes x 3 doses as needed for chest pain., Disp: 12 g, Rfl: 2   omeprazole (PRILOSEC) 40 MG capsule, TAKE 1 CAPSULE (40 MG TOTAL) BY MOUTH DAILY., Disp: 90 capsule, Rfl: 0   oxyCODONE (OXYCONTIN) 15 mg 12 hr tablet, 15 mg every 12 (twelve) hours., Disp: , Rfl:    Oxycodone HCl 10 MG TABS, Take 10 mg by mouth 4 (four) times daily as needed., Disp: , Rfl:    ZTLIDO 1.8 % PTCH, Apply 1 patch topically at bedtime., Disp: , Rfl:    aspirin EC 81 MG tablet, Take 1 tablet (81 mg total) by mouth daily. Swallow whole. (Patient not taking: Reported on 08/11/2023), Disp: 360 tablet, Rfl: 0   Family History  Problem Relation Age of Onset   Stroke Mother    Hypertension Mother    Hyperlipidemia Mother    Diabetes Mother    COPD Mother    Cancer Mother    Arthritis Mother    Hypertension Father    COPD Father    Cancer Father    CAD  Father    Colon cancer Father    Lung cancer Father    Heart attack Father    Stroke Sister    Kidney disease Sister    Hypertension Sister    Hyperlipidemia Sister    Diabetes Sister    COPD Sister    Arthritis Sister    Hyperlipidemia Sister    Arthritis Sister    Stroke Sister    Hypertension Sister    Hyperlipidemia Sister    Heart disease Sister    Heart attack Sister    Diabetes Sister    COPD Sister    Asthma Sister    Arthritis Sister    Stroke Brother    Hyperlipidemia Brother    COPD Brother    Arthritis Brother    Pancreatic cancer Brother    Arthritis Maternal Grandmother    Arthritis Maternal Grandfather    Arthritis Paternal Grandmother    Arthritis Paternal Grandfather    Hypertension Other      Social History   Tobacco Use   Smoking status: Never   Smokeless tobacco: Never  Vaping Use   Vaping status: Never Used  Substance Use Topics   Alcohol use: Yes     Comment: occ   Drug use: Yes    Comment: prescribed oxycodone and oxycotin    Allergies as of 11/15/2023 - Review Complete 11/15/2023  Allergen Reaction Noted   Vicodin [hydrocodone-acetaminophen] Rash 01/11/2016   Amoxicillin Rash and Other (See Comments) 01/11/2016   Gabapentin Rash 01/11/2016    Review of Systems:    All systems reviewed and negative except where noted in HPI.   Physical Exam:  BP (!) 144/90 (BP Location: Right Arm, Patient Position: Sitting, Cuff Size: Large)   Pulse 69   Temp 98.1 F (36.7 C) (Oral)   Ht 4\' 9"  (1.448 m)   Wt 215 lb 2 oz (97.6 kg)   LMP  (LMP Unknown) Comment: last menstrual 9 years ago, tubal ligation  BMI 46.55 kg/m  No LMP recorded (lmp unknown). Patient is postmenopausal.  General:   Alert,  Well-developed, well-nourished, pleasant and cooperative in NAD Head:  Normocephalic and atraumatic. Eyes:  Sclera clear, no icterus.   Conjunctiva pink. Ears:  Normal auditory acuity. Nose:  No deformity, discharge, or lesions. Mouth:  No deformity or lesions,oropharynx pink & moist. Neck:  Supple; no masses or thyromegaly. Lungs:  Respirations even and unlabored.  Clear throughout to auscultation.   No wheezes, crackles, or rhonchi. No acute distress. Heart:  Regular rate and rhythm; no murmurs, clicks, rubs, or gallops. Abdomen:  Normal bowel sounds. Soft, obese, non-tender and non-distended without masses, hepatosplenomegaly or hernias noted.  No guarding or rebound tenderness.   Rectal: Not performed Msk:  Symmetrical without gross deformities. Good, equal movement & strength bilaterally. Pulses:  Normal pulses noted. Extremities:  No clubbing, 2+ edema.  No cyanosis. Neurologic:  Alert and oriented x3;  grossly normal neurologically. Skin:  Intact without significant lesions or rashes. No jaundice. Psych:  Alert and cooperative. Normal mood and affect.  Imaging Studies: Reviewed  Assessment and Plan:   Brandi Bright is a 52 y.o.  female with history of compensated cirrhosis of liver, metabolic syndrome, hypothyroidism, history of chronic hep C, esophageal varices s/p ligation, chronic thrombocytopenia secondary to portal hypertension  Compensated cirrhosis of liver, combination of fatty liver and history of hep C status posttreatment Portal hypertension manifested by esophageal varices and thrombocytopenia Recommend EGD for variceal surveillance, assess for repeat banding  Given history of severe thrombocytopenia, will discuss with her heme-onc specialist, Dr. Cathie Hoops to arrange for avatrombopag  Per protocol Begin avatrombopag 10 to 13 days prior to the scheduled procedure. Patients should undergo procedure 5 to 8 days after the last avatrombopag dose. Obtain a platelet count prior to therapy administration and on the day of the procedure (to ensure adequate increase in platelet count). Platelet count 40,000 to <50,000/mm3: 40 mg once daily for 5 consecutive days Platelet count <40,000/mm3: 60 mg once daily for 5 consecutive days  Currently hypervolemic with bilateral swelling of legs, recommend to start low-dose diuretics Lasix 20 mg and spironolactone 50 mg daily, recheck BMP in 1 week, increase the dose as tolerated, reiterated on strict low-sodium diet PSE: None HCC screening: Recommend right upper quadrant ultrasound and AFP levels  Iron and B12 deficiency anemia secondary to chronic blood loss Currently resolved  Follow up in 6 months   [name], MD

## 2023-11-16 ENCOUNTER — Encounter: Payer: Self-pay | Admitting: Gastroenterology

## 2023-11-16 LAB — AFP TUMOR MARKER: AFP, Serum, Tumor Marker: <1.8 ng/mL (ref 0.0–9.2)

## 2023-11-17 ENCOUNTER — Ambulatory Visit: Payer: Medicaid Other | Admitting: Nurse Practitioner

## 2023-11-17 ENCOUNTER — Inpatient Hospital Stay: Payer: Medicaid Other | Attending: Oncology

## 2023-11-17 ENCOUNTER — Other Ambulatory Visit: Payer: Self-pay | Admitting: Gastroenterology

## 2023-11-17 DIAGNOSIS — D509 Iron deficiency anemia, unspecified: Secondary | ICD-10-CM | POA: Insufficient documentation

## 2023-11-17 DIAGNOSIS — D696 Thrombocytopenia, unspecified: Secondary | ICD-10-CM

## 2023-11-17 DIAGNOSIS — E538 Deficiency of other specified B group vitamins: Secondary | ICD-10-CM | POA: Insufficient documentation

## 2023-11-17 LAB — CBC WITH DIFFERENTIAL (CANCER CENTER ONLY)
Abs Immature Granulocytes: 0.01 10*3/uL (ref 0.00–0.07)
Basophils Absolute: 0 10*3/uL (ref 0.0–0.1)
Basophils Relative: 1 %
Eosinophils Absolute: 0.1 10*3/uL (ref 0.0–0.5)
Eosinophils Relative: 2 %
HCT: 40.4 % (ref 36.0–46.0)
Hemoglobin: 13.8 g/dL (ref 12.0–15.0)
Immature Granulocytes: 0 %
Lymphocytes Relative: 31 %
Lymphs Abs: 1 10*3/uL (ref 0.7–4.0)
MCH: 29.7 pg (ref 26.0–34.0)
MCHC: 34.2 g/dL (ref 30.0–36.0)
MCV: 86.9 fL (ref 80.0–100.0)
Monocytes Absolute: 0.3 10*3/uL (ref 0.1–1.0)
Monocytes Relative: 8 %
Neutro Abs: 1.8 10*3/uL (ref 1.7–7.7)
Neutrophils Relative %: 58 %
Platelet Count: 52 10*3/uL — ABNORMAL LOW (ref 150–400)
RBC: 4.65 MIL/uL (ref 3.87–5.11)
RDW: 12.9 % (ref 11.5–15.5)
WBC Count: 3.1 10*3/uL — ABNORMAL LOW (ref 4.0–10.5)
nRBC: 0 % (ref 0.0–0.2)

## 2023-11-19 ENCOUNTER — Other Ambulatory Visit: Payer: Self-pay

## 2023-11-19 ENCOUNTER — Ambulatory Visit
Admission: RE | Admit: 2023-11-19 | Discharge: 2023-11-19 | Disposition: A | Discharge status: Home / Self Care | Payer: Medicaid Other | Source: Ambulatory Visit | Attending: Gastroenterology

## 2023-11-19 ENCOUNTER — Telehealth: Payer: Self-pay

## 2023-11-19 DIAGNOSIS — K7460 Unspecified cirrhosis of liver: Secondary | ICD-10-CM | POA: Insufficient documentation

## 2023-11-19 DIAGNOSIS — D696 Thrombocytopenia, unspecified: Secondary | ICD-10-CM

## 2023-11-19 DIAGNOSIS — E538 Deficiency of other specified B group vitamins: Secondary | ICD-10-CM

## 2023-11-19 NOTE — Telephone Encounter (Signed)
-----   Message from [facility] sent at 11/19/2023 11:16 AM EST ----- No liver lesions based on [place], Repeat US liver in 6months.   RV

## 2023-11-19 NOTE — Telephone Encounter (Signed)
Brandi Bright will you please schedule and notify patient of date and time.

## 2023-11-19 NOTE — Telephone Encounter (Signed)
-----   Message from Rickard Patience sent at 11/18/2023 10:06 PM EST ----- Please schedule her to follow up in late Feb lab prior to MD cbc B12 , iron tibc ferritin, LFT Thanks.  zy

## 2023-11-19 NOTE — Telephone Encounter (Signed)
Put reminder for 6 months and called patient and patient verbalized understanding of results

## 2023-11-22 ENCOUNTER — Telehealth: Payer: Self-pay

## 2023-11-22 DIAGNOSIS — K7460 Unspecified cirrhosis of liver: Secondary | ICD-10-CM

## 2023-11-22 NOTE — Telephone Encounter (Signed)
-----   Message from [facility] Dietrich H sent at 11/15/2023  1:27 PM EST ----- BMP in 1 week

## 2023-11-22 NOTE — Telephone Encounter (Signed)
Patient states she can not come now till Wednesday because her daughter can not bring her but she will come Wednesday afternoon

## 2023-11-22 NOTE — Telephone Encounter (Signed)
Patient verbalized understanding is coming today for lab work

## 2023-11-24 DIAGNOSIS — M542 Cervicalgia: Secondary | ICD-10-CM | POA: Diagnosis not present

## 2023-11-24 DIAGNOSIS — K7460 Unspecified cirrhosis of liver: Secondary | ICD-10-CM | POA: Diagnosis not present

## 2023-11-24 DIAGNOSIS — M5451 Vertebrogenic low back pain: Secondary | ICD-10-CM | POA: Diagnosis not present

## 2023-11-24 DIAGNOSIS — M25551 Pain in right hip: Secondary | ICD-10-CM | POA: Diagnosis not present

## 2023-11-24 DIAGNOSIS — Z79891 Long term (current) use of opiate analgesic: Secondary | ICD-10-CM | POA: Diagnosis not present

## 2023-11-24 DIAGNOSIS — G894 Chronic pain syndrome: Secondary | ICD-10-CM | POA: Diagnosis not present

## 2023-11-25 ENCOUNTER — Encounter: Payer: Self-pay | Admitting: Gastroenterology

## 2023-11-25 LAB — BASIC METABOLIC PANEL
BUN/Creatinine Ratio: 15 (ref 9–23)
BUN: 11 mg/dL (ref 6–24)
CO2: 24 mmol/L (ref 20–29)
Calcium: 8.8 mg/dL (ref 8.7–10.2)
Chloride: 105 mmol/L (ref 96–106)
Creatinine, Ser: 0.72 mg/dL (ref 0.57–1.00)
Glucose: 95 mg/dL (ref 70–99)
Potassium: 3.7 mmol/L (ref 3.5–5.2)
Sodium: 142 mmol/L (ref 134–144)
eGFR: 100 mL/min/{1.73_m2} (ref >59)

## 2023-11-26 ENCOUNTER — Telehealth: Payer: Self-pay

## 2023-11-26 ENCOUNTER — Ambulatory Visit
Admission: RE | Admit: 2023-11-26 | Discharge: 2023-11-26 | Disposition: A | Discharge status: Home / Self Care | Payer: Medicaid Other | Source: Ambulatory Visit | Attending: Nurse Practitioner | Admitting: Nurse Practitioner

## 2023-11-26 ENCOUNTER — Ambulatory Visit: Payer: Medicaid Other | Admitting: Nurse Practitioner

## 2023-11-26 ENCOUNTER — Encounter: Payer: Self-pay | Admitting: Nurse Practitioner

## 2023-11-26 VITALS — BP 126/84 | HR 68 | Temp 98.3°F | Ht <= 58 in | Wt 213.8 lb

## 2023-11-26 DIAGNOSIS — Z6841 Body Mass Index (BMI) 40.0 and over, adult: Secondary | ICD-10-CM | POA: Diagnosis not present

## 2023-11-26 DIAGNOSIS — Z1231 Encounter for screening mammogram for malignant neoplasm of breast: Secondary | ICD-10-CM

## 2023-11-26 DIAGNOSIS — D709 Neutropenia, unspecified: Secondary | ICD-10-CM

## 2023-11-26 DIAGNOSIS — E039 Hypothyroidism, unspecified: Secondary | ICD-10-CM

## 2023-11-26 DIAGNOSIS — E782 Mixed hyperlipidemia: Secondary | ICD-10-CM | POA: Diagnosis not present

## 2023-11-26 DIAGNOSIS — H65192 Other acute nonsuppurative otitis media, left ear: Secondary | ICD-10-CM | POA: Diagnosis not present

## 2023-11-26 MED ORDER — WEGOVY 0.25 MG/0.5ML ~~LOC~~ SOAJ: 0.2500 mg | SUBCUTANEOUS | 1 refills | Status: DC

## 2023-11-26 MED ORDER — NEOMYCIN-POLYMYXIN-HC 1 % OT SOLN: OTIC | 0 refills | Status: DC

## 2023-11-26 NOTE — Telephone Encounter (Signed)
-----   Message from [facility] sent at 11/26/2023  8:31 AM EST ----- Please inform patient that the electrolytes and kidney function are normal on current dose of Lasix and spironolactone.  If she is not noticing much improvement in swelling of her legs, recommend to increase Lasix to 40 and spironolactone 100 mg and recheck BMP in 1 week.  If she notices significant improvement in swelling of legs, continue the current dose  RV

## 2023-11-26 NOTE — Patient Instructions (Addendum)
Please go the lab for blood work. Please call and schedule an appointment with eye doctor.

## 2023-11-26 NOTE — Progress Notes (Signed)
Follow-up follow-up with  Established Patient Office Visit  Subjective:  Patient ID: Brandi Bright, female    DOB: 1971-04-24  Age: 52 y.o. MRN: [id number]  CC:  Chief Complaint  Patient presents with   Medical Management of Chronic Issues    Lefy ear and eye pain     HPI  Stpehanie Andert Bright presents for chronic disease follow up. She has history of hypothyrodism, hyperlipidemia, GERD months cirrhosis.  She is followed by oncology and treatment.  Pt states that since last 5 days she has been experiencing left ear and eye pain.  Patient states that she is overdue for her eye examination.  She is followed by [facility] vision.  She would like to get strated on weight loss medication and would like to try wegovy. Has no personal or family history of MTC or MEN 2.   HPI   Past Medical History:  Diagnosis Date   Arthritis    B12 deficiency 07/20/2023   Back pain    Chronic knee pain    Cirrhosis (HCC)    Dysphagia    Dyspnea    GI bleed 06/24/2019   Gout    Heart attack (HCC)    History of blood transfusion    HLD (hyperlipidemia)    Hypothyroidism    IDA (iron deficiency anemia) 01/16/2020   Iron deficiency anemia due to chronic blood loss 01/16/2020   Leukopenia 09/23/2018   MI, old    Oropharyngeal dysphagia 04/07/2021   Pleurisy 01/13/2022   Sinusitis 04/07/2021   Stomach irritation    Thyroid disease     Past Surgical History:  Procedure Laterality Date   CARDIAC CATHETERIZATION     CESAREAN SECTION     COLONOSCOPY WITH PROPOFOL N/A 03/16/2018   Procedure: COLONOSCOPY WITH PROPOFOL;  Surgeon: Pasty Spillers, MD;  Location: [facility] ENDOSCOPY;  Service: Endoscopy;  Laterality: N/A;   COLONOSCOPY WITH PROPOFOL N/A 07/09/2021   Procedure: COLONOSCOPY WITH PROPOFOL;  Surgeon: Pasty Spillers, MD;  Location: [facility] ENDOSCOPY;  Service: Endoscopy;  Laterality: N/A;   ESOPHAGOGASTRODUODENOSCOPY N/A 10/11/2019   Procedure: ESOPHAGOGASTRODUODENOSCOPY (EGD);  Surgeon: Toney Reil, MD;  Location[facility] ENDOSCOPY;  Service: Gastroenterology;  Laterality: N/A;   ESOPHAGOGASTRODUODENOSCOPY (EGD) WITH PROPOFOL N/A 10/27/2018   Procedure: ESOPHAGOGASTRODUODENOSCOPY (EGD) WITH PROPOFOL;  Surgeon: Pasty Spillers, MD;  Location: [facility] ENDOSCOPY;  Service: Endoscopy;  Laterality: N/A;   ESOPHAGOGASTRODUODENOSCOPY (EGD) WITH PROPOFOL N/A 06/25/2019   Procedure: ESOPHAGOGASTRODUODENOSCOPY (EGD) WITH PROPOFOL;  Surgeon: Wyline Mood, MD;  Location: [facility] ENDOSCOPY;  Service: Gastroenterology;  Laterality: N/A;   ESOPHAGOGASTRODUODENOSCOPY (EGD) WITH PROPOFOL N/A 09/12/2019   Procedure: ESOPHAGOGASTRODUODENOSCOPY (EGD) WITH PROPOFOL;  Surgeon: Midge Minium, MD;  Location: [facility] ENDOSCOPY;  Service: Endoscopy;  Laterality: N/A;   ESOPHAGOGASTRODUODENOSCOPY (EGD) WITH PROPOFOL N/A 11/20/2019   Procedure: ESOPHAGOGASTRODUODENOSCOPY (EGD) WITH PROPOFOL;  Surgeon: Pasty Spillers, MD;  Location: [facility] ENDOSCOPY;  Service: Endoscopy;  Laterality: N/A;   ESOPHAGOGASTRODUODENOSCOPY (EGD) WITH PROPOFOL N/A 11/23/2019   Procedure: ESOPHAGOGASTRODUODENOSCOPY (EGD) WITH PROPOFOL;  Surgeon: Pasty Spillers, MD;  Location: [facility] ENDOSCOPY;  Service: Endoscopy;  Laterality: N/A;   ESOPHAGOGASTRODUODENOSCOPY (EGD) WITH PROPOFOL N/A 01/03/2020   Procedure: ESOPHAGOGASTRODUODENOSCOPY (EGD) WITH PROPOFOL;  Surgeon: Toney Reil, MD;  Location: [facility] ENDOSCOPY;  Service: Endoscopy;  Laterality: N/A;   ESOPHAGOGASTRODUODENOSCOPY (EGD) WITH PROPOFOL N/A 07/09/2021   Procedure: ESOPHAGOGASTRODUODENOSCOPY (EGD) WITH PROPOFOL;  Surgeon: Pasty Spillers, MD;  Location: [facility] ENDOSCOPY;  Service: Endoscopy;  Laterality: N/A;   ESOPHAGOGASTRODUODENOSCOPY (EGD) WITH PROPOFOL  N/A 05/17/2023   Procedure: ESOPHAGOGASTRODUODENOSCOPY (EGD) WITH PROPOFOL;  Surgeon: Toney Reil, MD;  Location: [facility] ENDOSCOPY;  Service: Gastroenterology;  Laterality: N/A;   ESOPHAGOGASTRODUODENOSCOPY (EGD)  WITH PROPOFOL N/A 09/27/2023   Procedure: ESOPHAGOGASTRODUODENOSCOPY (EGD) WITH PROPOFOL;  Surgeon: Toney Reil, MD;  Location: [facility] ENDOSCOPY;  Service: Gastroenterology;  Laterality: N/A;   ESOPHAGOGASTRODUODENOSCOPY (EGD) WITH PROPOFOL N/A 12/02/2023   Procedure: ESOPHAGOGASTRODUODENOSCOPY (EGD) WITH PROPOFOL;  Surgeon: Toney Reil, MD;  Location: [facility] ENDOSCOPY;  Service: Gastroenterology;  Laterality: N/A;   HEMOSTASIS CLIP PLACEMENT  09/27/2023   Procedure: HEMOSTASIS CLIP PLACEMENT;  Surgeon: Toney Reil, MD;  Location: [facility] ENDOSCOPY;  Service: Gastroenterology;;   POLYPECTOMY  09/27/2023   Procedure: POLYPECTOMY;  Surgeon: Toney Reil, MD;  Location: [facility] ENDOSCOPY;  Service: Gastroenterology;;   TUBAL LIGATION      Family History  Problem Relation Age of Onset   Stroke Mother    Hypertension Mother    Hyperlipidemia Mother    Diabetes Mother    COPD Mother    Cancer Mother    Arthritis Mother    Hypertension Father    COPD Father    Cancer Father    CAD Father    Colon cancer Father    Lung cancer Father    Heart attack Father    Stroke Sister    Kidney disease Sister    Hypertension Sister    Hyperlipidemia Sister    Diabetes Sister    COPD Sister    Arthritis Sister    Hyperlipidemia Sister    Arthritis Sister    Stroke Sister    Hypertension Sister    Hyperlipidemia Sister    Heart disease Sister    Heart attack Sister    Diabetes Sister    COPD Sister    Asthma Sister    Arthritis Sister    Breast cancer Maternal Aunt 32 - 59   Arthritis Maternal Grandmother    Arthritis Maternal Grandfather    Arthritis Paternal Grandmother    Arthritis Paternal Grandfather    Breast cancer Cousin 41 - 49   Stroke Brother    Hyperlipidemia Brother    COPD Brother    Arthritis Brother    Pancreatic cancer Brother    Hypertension Other     Social History   Socioeconomic History   Marital status: Single    Spouse name: Not on  file   Number of children: Not on file   Years of education: Not on file   Highest education level: Not on file  Occupational History   Not on file  Tobacco Use   Smoking status: Never   Smokeless tobacco: Never  Vaping Use   Vaping status: Never Used  Substance and Sexual Activity   Alcohol use: Yes    Comment: occ   Drug use: Yes    Comment: prescribed oxycodone and oxycotin   Sexual activity: Not Currently  Other Topics Concern   Not on file  Social History Narrative   Not on file   Social Drivers of Health   Financial Resource Strain: Medium Risk (10/11/2019)   Overall Financial Resource Strain (CARDIA)    Difficulty of Paying Living Expenses: Somewhat hard  Food Insecurity: No Food Insecurity (04/01/2023)   Hunger Vital Sign    Worried About Running Out of Food in the Last Year: Never true    Ran Out of Food in the Last Year: Never true  Transportation Needs: No Transportation Needs (04/01/2023)  PRAPARE - Administrator, Civil Service (Medical): No    Lack of Transportation (Non-Medical): No  Physical Activity: Inactive (10/11/2019)   Exercise Vital Sign    Days of Exercise per Week: 0 days    Minutes of Exercise per Session: 0 min  Stress: Stress Concern Present (10/11/2019)   [employer] of Occupational Health - Occupational Stress Questionnaire    Feeling of Stress : Rather much  Social Connections: Unknown (10/11/2019)   Social Connection and Isolation Panel [NHANES]    Frequency of Communication with Friends and Family: More than three times a week    Frequency of Social Gatherings with Friends and Family: More than three times a week    Attends Religious Services: Never    Database administrator or Organizations: No    Attends Engineer, structural: Not asked    Marital Status: Not on file  Intimate Partner Violence: Not At Risk (04/01/2023)   Humiliation, Afraid, Rape, and Kick questionnaire    Fear of Current or Ex-Partner: No     Emotionally Abused: No    Physically Abused: No    Sexually Abused: No     Outpatient Medications Prior to Visit  Medication Sig Dispense Refill   Cyanocobalamin (B-12) 2500 MCG SUBL Place 1 tablet under the tongue daily. 90 tablet 1   furosemide (LASIX) 20 MG tablet Take 1 tablet (20 mg total) by mouth daily. 30 tablet 2   levothyroxine (SYNTHROID) 137 MCG tablet Take 1 tablet (137 mcg total) by mouth daily before breakfast. D/C 150 mcg 90 tablet 1   nitroGLYCERIN (NITROLINGUAL) 0.4 MG/SPRAY spray Place 1 spray under the tongue every 5 (five) minutes x 3 doses as needed for chest pain. 12 g 2   omeprazole (PRILOSEC) 40 MG capsule TAKE 1 CAPSULE (40 MG TOTAL) BY MOUTH DAILY. 90 capsule 0   oxyCODONE (OXYCONTIN) 15 mg 12 hr tablet 15 mg every 12 (twelve) hours.     Oxycodone HCl 10 MG TABS Take 10 mg by mouth 4 (four) times daily as needed.     spironolactone (ALDACTONE) 50 MG tablet Take 1 tablet (50 mg total) by mouth daily. 30 tablet 2   ZTLIDO 1.8 % PTCH Apply 1 patch topically at bedtime.     aspirin EC 81 MG tablet Take 1 tablet (81 mg total) by mouth daily. Swallow whole. (Patient not taking: Reported on 08/11/2023) 360 tablet 0   levothyroxine (SYNTHROID) 150 MCG tablet Take 150 mcg by mouth daily. (Patient not taking: Reported on 11/26/2023)     No facility-administered medications prior to visit.    Allergies  Allergen Reactions   Vicodin [Hydrocodone-Acetaminophen] Rash   Amoxicillin Rash and Other (See Comments)    Has patient had a PCN reaction causing immediate rash, facial/tongue/throat swelling, SOB or lightheadedness with hypotension: Yes Has patient had a PCN reaction causing severe rash involving mucus membranes or skin necrosis: No Has patient had a PCN reaction that required hospitalization: No Has patient had a PCN reaction occurring within the last 10 years: No If all of the above answers are "NO", then may proceed with Cephalosporin use.    Gabapentin Rash     ROS Review of Systems Negative unless indicated in HPI.    Objective:    Physical Exam Constitutional:      Appearance: Normal appearance. She is obese.  HENT:     Right Ear: Tympanic membrane normal.     Left Ear: A middle ear effusion  is present.     Mouth/Throat:     Mouth: Mucous membranes are moist.  Eyes:     Conjunctiva/sclera: Conjunctivae normal.     Pupils: Pupils are equal, round, and reactive to light.  Cardiovascular:     Rate and Rhythm: Normal rate and regular rhythm.     Pulses: Normal pulses.     Heart sounds: Normal heart sounds.  Pulmonary:     Effort: Pulmonary effort is normal.     Breath sounds: Normal breath sounds.  Abdominal:     General: Bowel sounds are normal.     Palpations: Abdomen is soft.  Musculoskeletal:     Cervical back: Normal range of motion. No tenderness.  Skin:    General: Skin is warm.     Findings: No bruising.  Neurological:     General: No focal deficit present.     Mental Status: She is alert and oriented to person, place, and time. Mental status is at baseline.  Psychiatric:        Mood and Affect: Mood normal.        Behavior: Behavior normal.        Thought Content: Thought content normal.        Judgment: Judgment normal.     BP 126/84   Pulse 68   Temp 98.3 F (36.8 C) (Oral)   Ht 4\' 9"  (1.448 m)   Wt 213 lb 12.8 oz (97 kg)   LMP  (LMP Unknown) Comment: last menstrual 9 years ago, tubal ligation  SpO2 97%   BMI 46.27 kg/m  Wt Readings from Last 3 Encounters:  12/02/23 213 lb 9.6 oz (96.9 kg)  11/26/23 213 lb 12.8 oz (97 kg)  11/15/23 215 lb 2 oz (97.6 kg)     Health Maintenance  Topic Date Due   COVID-19 Vaccine (1) Never done   Zoster Vaccines- Shingrix (1 of 2) Never done   Cervical Cancer Screening (HPV/Pap Cotest)  Never done   DTaP/Tdap/Td (2 - Tdap) 09/12/2006   MAMMOGRAM  11/25/2025   Colonoscopy  07/09/2026   Hepatitis C Screening  Completed   HIV Screening  Completed   HPV VACCINES   Aged Out   INFLUENZA VACCINE  Discontinued    There are no preventive care reminders to display for this patient.  Lab Results  Component Value Date   TSH 0.01 (L) 11/26/2023   Lab Results  Component Value Date   WBC 2.4 (L) 12/02/2023   HGB 13.1 12/02/2023   HCT 37.9 12/02/2023   MCV 87.5 12/02/2023   PLT 39 (L) 12/02/2023   Lab Results  Component Value Date   NA 142 11/24/2023   K 3.7 11/24/2023   CO2 24 11/24/2023   GLUCOSE 95 11/24/2023   BUN 11 11/24/2023   CREATININE 0.72 11/24/2023   BILITOT 1.0 08/11/2023   ALKPHOS 95 08/11/2023   AST 29 08/11/2023   ALT 17 08/11/2023   PROT 6.5 08/11/2023   ALBUMIN 3.6 08/11/2023   CALCIUM 8.8 11/24/2023   ANIONGAP 5 07/09/2023   EGFR 100 11/24/2023   GFR 100.19 08/11/2023   Lab Results  Component Value Date   CHOL 125 08/11/2023   Lab Results  Component Value Date   HDL 30.70 (L) 08/11/2023   Lab Results  Component Value Date   LDLCALC 73 08/11/2023   Lab Results  Component Value Date   TRIG 108.0 08/11/2023   Lab Results  Component Value Date   CHOLHDL 4 08/11/2023  Lab Results  Component Value Date   HGBA1C 5.0 08/11/2023      Assessment & Plan:  Acquired hypothyroidism Assessment & Plan: On Levothyroxine 137 mcg daily.  Will check TSH and free t4, free t3  Orders: -     T3, free -     T4, free -     TSH  Obesity, Class III, BMI 40-49.9 (morbid obesity) (HCC) Assessment & Plan: Body mass index is 46.27 kg/m. Patient denies personal or family history MEN 2 or MTC. Medication side effect discussed. Advised patient to inject 0.25 mg once weekly  Orders: -     ZOXWRU; Inject 0.25 mg into the skin once a week.  Dispense: 2 mL; Refill: 1  Moderate mixed hyperlipidemia not requiring statin therapy Assessment & Plan: Advised to consume healthy diet, perform regular aerobic exercise and weight loss. Lab Results  Component Value Date   CHOL 125 08/11/2023   HDL 30.70 (L) 08/11/2023    LDLCALC 73 08/11/2023   TRIG 108.0 08/11/2023   CHOLHDL 4 08/11/2023       Acute effusion of left ear Assessment & Plan: Will treat with polymix ear drops. Patient would let us know if symptoms not improving.    Other orders -     Neomycin-Polymyxin-HC; 4 gtt in affected ear(s) tid, max of 10 days. Lie with affected ear upward x 5 minutes  Dispense: 10 mL; Refill: 0  Red flag discussed.  Patient was provided clear instructions to go to ER or urgent care if symptoms does not improve, red flag or new problem develops.  Patient verbalized understanding.   Follow-up: No follow-ups on file.   Kara Dies, NP

## 2023-11-26 NOTE — Telephone Encounter (Signed)
Patient states her swelling in her legs has improved and she is going to the bathroom very frequent. She will let us know if her swelling get worse again.

## 2023-11-29 LAB — T3, FREE: T3, Free: 3.1 pg/mL (ref 2.3–4.2)

## 2023-11-29 LAB — T4, FREE: Free T4: 1.67 ng/dL — ABNORMAL HIGH (ref 0.60–1.60)

## 2023-11-29 LAB — TSH: TSH: 0.01 u[IU]/mL — ABNORMAL LOW (ref 0.35–5.50)

## 2023-11-30 ENCOUNTER — Telehealth: Payer: Self-pay

## 2023-11-30 ENCOUNTER — Encounter: Payer: Self-pay | Admitting: Nurse Practitioner

## 2023-11-30 ENCOUNTER — Telehealth: Payer: Self-pay | Admitting: Pharmacist

## 2023-11-30 NOTE — Telephone Encounter (Signed)
Pa for wegovy needed

## 2023-11-30 NOTE — Telephone Encounter (Signed)
Pharmacy Patient Advocate Encounter   Received notification from Pt Calls Messages that prior authorization for Wegovy 0.25MG /0.5ML auto-injectors is required/requested.   Insurance verification completed.   The patient is insured through [facility] .   Per test claim: PA required; PA submitted to above mentioned insurance via CoverMyMeds Key/confirmation #/EOC [facility] Status is pending

## 2023-12-01 ENCOUNTER — Other Ambulatory Visit: Payer: Self-pay | Admitting: Nurse Practitioner

## 2023-12-01 ENCOUNTER — Other Ambulatory Visit (HOSPITAL_COMMUNITY): Payer: Self-pay

## 2023-12-01 DIAGNOSIS — R7989 Other specified abnormal findings of blood chemistry: Secondary | ICD-10-CM

## 2023-12-01 MED ORDER — LEVOTHYROXINE SODIUM 125 MCG PO TABS: 125.0000 ug | ORAL_TABLET | Freq: Every day | ORAL | 3 refills | Status: DC

## 2023-12-01 NOTE — Progress Notes (Signed)
Please inform pt:  TSH level are lower compared to previous lab.Will decrease the dose of levothyroxine from 137 mcg to 125 mcg and will repeat labs in 6 weeks, please schedule.

## 2023-12-01 NOTE — Telephone Encounter (Signed)
Pharmacy Patient Advocate Encounter  Received notification from [facility] that Prior Authorization for Wegovy 0.25MG /0.5ML auto-injectors has been APPROVED from 11/30/2023 to 05/28/2024   PA #/Case ID/Reference #: [id number]

## 2023-12-02 ENCOUNTER — Telehealth: Payer: Self-pay

## 2023-12-02 ENCOUNTER — Telehealth: Payer: Self-pay | Admitting: Pharmacist

## 2023-12-02 ENCOUNTER — Ambulatory Visit: Payer: Medicaid Other | Admitting: Anesthesiology

## 2023-12-02 ENCOUNTER — Other Ambulatory Visit (HOSPITAL_COMMUNITY): Payer: Self-pay

## 2023-12-02 ENCOUNTER — Encounter: Payer: Self-pay | Admitting: Gastroenterology

## 2023-12-02 ENCOUNTER — Ambulatory Visit
Admission: RE | Admit: 2023-12-02 | Discharge: 2023-12-02 | Disposition: A | Discharge status: Home / Self Care | Payer: Medicaid Other | Attending: Gastroenterology | Admitting: Gastroenterology

## 2023-12-02 ENCOUNTER — Encounter: Payer: Self-pay | Admitting: Oncology

## 2023-12-02 ENCOUNTER — Encounter
Admission: RE | Disposition: A | Discharge status: Home / Self Care | Payer: Self-pay | Source: Home / Self Care | Attending: Gastroenterology

## 2023-12-02 DIAGNOSIS — K7460 Unspecified cirrhosis of liver: Secondary | ICD-10-CM | POA: Diagnosis not present

## 2023-12-02 DIAGNOSIS — D696 Thrombocytopenia, unspecified: Secondary | ICD-10-CM

## 2023-12-02 DIAGNOSIS — I8510 Secondary esophageal varices without bleeding: Secondary | ICD-10-CM | POA: Diagnosis not present

## 2023-12-02 DIAGNOSIS — I252 Old myocardial infarction: Secondary | ICD-10-CM | POA: Insufficient documentation

## 2023-12-02 DIAGNOSIS — Z8673 Personal history of transient ischemic attack (TIA), and cerebral infarction without residual deficits: Secondary | ICD-10-CM | POA: Diagnosis not present

## 2023-12-02 DIAGNOSIS — Z5309 Procedure and treatment not carried out because of other contraindication: Secondary | ICD-10-CM | POA: Diagnosis not present

## 2023-12-02 HISTORY — PX: ESOPHAGOGASTRODUODENOSCOPY (EGD) WITH PROPOFOL: SHX5813

## 2023-12-02 LAB — CBC
HCT: 37.9 % (ref 36.0–46.0)
Hemoglobin: 13.1 g/dL (ref 12.0–15.0)
MCH: 30.3 pg (ref 26.0–34.0)
MCHC: 34.6 g/dL (ref 30.0–36.0)
MCV: 87.5 fL (ref 80.0–100.0)
Platelets: 39 10*3/uL — ABNORMAL LOW (ref 150–400)
RBC: 4.33 MIL/uL (ref 3.87–5.11)
RDW: 12.7 % (ref 11.5–15.5)
WBC: 2.4 10*3/uL — ABNORMAL LOW (ref 4.0–10.5)
nRBC: 0 % (ref 0.0–0.2)

## 2023-12-02 SURGERY — ESOPHAGOGASTRODUODENOSCOPY (EGD) WITH PROPOFOL
Anesthesia: General

## 2023-12-02 MED ORDER — PROPOFOL 1000 MG/100ML IV EMUL
INTRAVENOUS | Status: AC
Start: 2023-12-02 — End: ?
  Filled 2023-12-02: qty 100

## 2023-12-02 MED ORDER — AVATROMBOPAG MALEATE 20 MG PO TABS
60.0000 mg | ORAL_TABLET | Freq: Every day | ORAL | 0 refills | Status: DC
  Filled 2023-12-03: qty 15, 5d supply, fill #0

## 2023-12-02 MED ORDER — LIDOCAINE HCL (PF) 2 % IJ SOLN
INTRAMUSCULAR | Status: AC
Start: 2023-12-02 — End: ?
  Filled 2023-12-02: qty 10

## 2023-12-02 NOTE — Telephone Encounter (Signed)
Per Dr. Allegra Lai Dr. Cathie Hoops, I thought from our last conversation that we are going to start her on eltrombopag even though her platelets were 52. She did not receive it prior to EGD this time. Her platelets today are 36. I am canceling her EGD. Morrie Sheldon, please schedule her EGD in January and this time we have to make sure that she receives the medication. I also educated the patient about this medication

## 2023-12-02 NOTE — Telephone Encounter (Signed)
Clinical Pharmacist Practitioner Encounter   Received new prescription for Doptelet (avatrombopag) for the treatment of thrombocytopenia due to cirrhosis. She will avatrombopag for 5 days beginning 10 to 13 days prior to scheduled procedure.   CBC from 12/02/23 assessed, no relevant lab abnormalities. Prescription dose and frequency assessed.   Current medication list in Epic reviewed, no DDIs with avatrombopag identified.   Evaluated chart and no patient barriers to medication adherence identified.   Prescription has been e-scribed to the [facility] for benefits analysis and approval.  [facility] will continue to follow for insurance authorization, copayment issues, initial counseling and start date.   [name], PharmD, BCPS, BCOP, CPP Hematology/Oncology Clinical Pharmacist Practitioner [place] [phone]  12/02/2023 3:44 PM

## 2023-12-02 NOTE — Anesthesia Preprocedure Evaluation (Signed)
Anesthesia Evaluation  Patient identified by MRN, date of birth, ID band Patient awake    Reviewed: Allergy & Precautions, NPO status , Patient's Chart, lab work & pertinent test results  History of Anesthesia Complications Negative for: history of anesthetic complications  Airway Mallampati: III  TM Distance: <3 FB Neck ROM: full    Dental  (+) Chipped, Poor Dentition   Pulmonary neg pulmonary ROS, neg shortness of breath   Pulmonary exam normal        Cardiovascular Exercise Tolerance: Good hypertension, (-) angina + CAD and + Past MI  Normal cardiovascular exam     Neuro/Psych  Headaches TIA negative psych ROS   GI/Hepatic ,GERD  Controlled,,(+) Hepatitis -  Endo/Other  Hypothyroidism    Renal/GU negative Renal ROS  negative genitourinary   Musculoskeletal   Abdominal   Peds  Hematology negative hematology ROS (+)   Anesthesia Other Findings Patient is NPO appropriate and reports no nausea or vomiting today.  Past Medical History: No date: Arthritis 07/20/2023: B12 deficiency No date: Back pain No date: Chronic knee pain No date: Cirrhosis (HCC) No date: Dysphagia No date: Dyspnea 06/24/2019: GI bleed No date: Gout No date: Heart attack (HCC) No date: History of blood transfusion No date: HLD (hyperlipidemia) No date: Hypothyroidism 01/16/2020: IDA (iron deficiency anemia) 01/16/2020: Iron deficiency anemia due to chronic blood loss 09/23/2018: Leukopenia No date: MI, old 04/07/2021: Oropharyngeal dysphagia 01/13/2022: Pleurisy 04/07/2021: Sinusitis No date: Stomach irritation No date: Thyroid disease  Past Surgical History: No date: CARDIAC CATHETERIZATION No date: CESAREAN SECTION 03/16/2018: COLONOSCOPY WITH PROPOFOL; N/A     Comment:  Procedure: COLONOSCOPY WITH PROPOFOL;  Surgeon:               Pasty Spillers, MD;  Location: [facility] ENDOSCOPY;                Service: Endoscopy;   Laterality: N/A; 07/09/2021: COLONOSCOPY WITH PROPOFOL; N/A     Comment:  Procedure: COLONOSCOPY WITH PROPOFOL;  Surgeon:               Pasty Spillers, MD;  Location: [facility] ENDOSCOPY;                Service: Endoscopy;  Laterality: N/A; 10/11/2019: ESOPHAGOGASTRODUODENOSCOPY; N/A     Comment:  Procedure: ESOPHAGOGASTRODUODENOSCOPY (EGD);  Surgeon:               Toney Reil, MD;  Location: [facility] ENDOSCOPY;                Service: Gastroenterology;  Laterality: N/A; 10/27/2018: ESOPHAGOGASTRODUODENOSCOPY (EGD) WITH PROPOFOL; N/A     Comment:  Procedure: ESOPHAGOGASTRODUODENOSCOPY (EGD) WITH               PROPOFOL;  Surgeon: Pasty Spillers, MD;  Location:               [facility] ENDOSCOPY;  Service: Endoscopy;  Laterality: N/A; 06/25/2019: ESOPHAGOGASTRODUODENOSCOPY (EGD) WITH PROPOFOL; N/A     Comment:  Procedure: ESOPHAGOGASTRODUODENOSCOPY (EGD) WITH               PROPOFOL;  Surgeon: Wyline Mood, MD;  Location: [facility]               ENDOSCOPY;  Service: Gastroenterology;  Laterality: N/A; 09/12/2019: ESOPHAGOGASTRODUODENOSCOPY (EGD) WITH PROPOFOL; N/A     Comment:  Procedure: ESOPHAGOGASTRODUODENOSCOPY (EGD) WITH               PROPOFOL;  Surgeon: Midge Minium, MD;  Location: [facility]               ENDOSCOPY;  Service: Endoscopy;  Laterality: N/A; 11/20/2019: ESOPHAGOGASTRODUODENOSCOPY (EGD) WITH PROPOFOL; N/A     Comment:  Procedure: ESOPHAGOGASTRODUODENOSCOPY (EGD) WITH               PROPOFOL;  Surgeon: Pasty Spillers, MD;  Location:               [facility] ENDOSCOPY;  Service: Endoscopy;  Laterality: N/A; 11/23/2019: ESOPHAGOGASTRODUODENOSCOPY (EGD) WITH PROPOFOL; N/A     Comment:  Procedure: ESOPHAGOGASTRODUODENOSCOPY (EGD) WITH               PROPOFOL;  Surgeon: Pasty Spillers, MD;  Location:               [facility] ENDOSCOPY;  Service: Endoscopy;  Laterality: N/A; 01/03/2020: ESOPHAGOGASTRODUODENOSCOPY (EGD) WITH PROPOFOL; N/A     Comment:  Procedure: ESOPHAGOGASTRODUODENOSCOPY  (EGD) WITH               PROPOFOL;  Surgeon: Toney Reil, MD;  Location:               [facility] ENDOSCOPY;  Service: Endoscopy;  Laterality: N/A; 07/09/2021: ESOPHAGOGASTRODUODENOSCOPY (EGD) WITH PROPOFOL; N/A     Comment:  Procedure: ESOPHAGOGASTRODUODENOSCOPY (EGD) WITH               PROPOFOL;  Surgeon: Pasty Spillers, MD;  Location:               [facility] ENDOSCOPY;  Service: Endoscopy;  Laterality: N/A; 05/17/2023: ESOPHAGOGASTRODUODENOSCOPY (EGD) WITH PROPOFOL; N/A     Comment:  Procedure: ESOPHAGOGASTRODUODENOSCOPY (EGD) WITH               PROPOFOL;  Surgeon: Toney Reil, MD;  Location:               [facility] ENDOSCOPY;  Service: Gastroenterology;  Laterality:               N/A; 09/27/2023: ESOPHAGOGASTRODUODENOSCOPY (EGD) WITH PROPOFOL; N/A     Comment:  Procedure: ESOPHAGOGASTRODUODENOSCOPY (EGD) WITH               PROPOFOL;  Surgeon: Toney Reil, MD;  Location:               [facility] ENDOSCOPY;  Service: Gastroenterology;  Laterality:               N/A; 09/27/2023: HEMOSTASIS CLIP PLACEMENT     Comment:  Procedure: HEMOSTASIS CLIP PLACEMENT;  Surgeon: Toney Reil, MD;  Location: [facility] ENDOSCOPY;  Service:               Gastroenterology;; 09/27/2023: POLYPECTOMY     Comment:  Procedure: POLYPECTOMY;  Surgeon: Toney Reil,               MD;  Location: [facility] ENDOSCOPY;  Service:               Gastroenterology;; No date: TUBAL LIGATION  BMI    Body Mass Index: 46.22 kg/m      Reproductive/Obstetrics negative OB ROS                             Anesthesia Physical Anesthesia Plan  ASA: 3  Anesthesia Plan: General   Post-op Pain Management:    Induction: Intravenous  PONV Risk Score  and Plan: Propofol infusion and TIVA  Airway Management Planned: Natural Airway and Nasal Cannula  Additional Equipment:   Intra-op Plan:   Post-operative Plan:   Informed Consent: I have reviewed the patients History  and Physical, chart, labs and discussed the procedure including the risks, benefits and alternatives for the proposed anesthesia with the patient or authorized representative who has indicated his/her understanding and acceptance.     Dental Advisory Given  Plan Discussed with: Anesthesiologist, CRNA and Surgeon  Anesthesia Plan Comments: (Patient consented for risks of anesthesia including but not limited to:  - adverse reactions to medications - risk of airway placement if required - damage to eyes, teeth, lips or other oral mucosa - nerve damage due to positioning  - sore throat or hoarseness - Damage to heart, brain, nerves, lungs, other parts of body or loss of life  Patient voiced understanding and assent.)       Anesthesia Quick Evaluation

## 2023-12-02 NOTE — Telephone Encounter (Signed)
-----   Message from Kara Dies sent at 12/01/2023  7:09 AM EST ----- Please inform pt:  TSH level are lower compared to previous lab.Will decrease the dose of levothyroxine from 137 mcg to 125 mcg and will repeat labs in 6 weeks, please schedule.

## 2023-12-02 NOTE — H&P (Addendum)
EGD canceled today because the platelets were 36.  Platelet goal is 50 or above in order to proceed with EGD for possible banding Will reschedule EGD in January after she receives eltrombopag Apologized to the patient for inconvenience and she is understandable regarding the risks and benefits of the medication prior to undergoing upper endoscopy  Deborah Lazcano

## 2023-12-02 NOTE — Telephone Encounter (Signed)
Oral Oncology Patient Advocate Encounter  After completing a benefits investigation, prior authorization for Doptelet is not required at this time through [employer] [place] ([employer]).  Patient's copay is $4.00.     Ardeen Fillers, CPhT Oncology Pharmacy Patient Advocate  [facility]  [phone] (phone) [phone] (fax) 12/02/2023 3:49 PM

## 2023-12-02 NOTE — Telephone Encounter (Signed)
Per Dr. Cathie Hoops she wanted to know when we were planning on doing the EGD. Informed her next available was 12/30/2023. Per Dr. Estevan Oaks would you please arrange her to get Avatrombopag 60 mg once daily for 5 consecutive days, start 10 to 13 days prior to the scheduled procedure.

## 2023-12-02 NOTE — Telephone Encounter (Signed)
Called and left a message for call back to find out if patient could do EGD on 12/30/2023

## 2023-12-02 NOTE — Telephone Encounter (Signed)
For a EGD procedure date of 12/30/23, patient would need to start 12/18/23-12/21/23.

## 2023-12-02 NOTE — Telephone Encounter (Signed)
Left message to call the office back regarding lab results  

## 2023-12-03 ENCOUNTER — Encounter: Payer: Self-pay | Admitting: Gastroenterology

## 2023-12-03 ENCOUNTER — Other Ambulatory Visit: Payer: Self-pay

## 2023-12-03 ENCOUNTER — Other Ambulatory Visit (HOSPITAL_COMMUNITY): Payer: Self-pay

## 2023-12-03 ENCOUNTER — Telehealth: Payer: Self-pay

## 2023-12-03 DIAGNOSIS — K7460 Unspecified cirrhosis of liver: Secondary | ICD-10-CM

## 2023-12-03 DIAGNOSIS — D500 Iron deficiency anemia secondary to blood loss (chronic): Secondary | ICD-10-CM

## 2023-12-03 DIAGNOSIS — I8510 Secondary esophageal varices without bleeding: Secondary | ICD-10-CM

## 2023-12-03 NOTE — Progress Notes (Signed)
Specialty Pharmacy Initial Fill Coordination Note  Brandi Bright is a 52 y.o. female contacted today regarding initial fill of specialty medication(s) Avatrombopag Maleate (DOPTELET)  Patient requested Delivery   Delivery date: 12/07/23   Verified address: [address], [place] [address]  Medication will be filled on 12/06/23.   Patient is aware of $4.00 copayment. Bill to [place].   Ardeen Fillers, CPhT Oncology Pharmacy Patient Advocate  [facility]  [phone] (phone) [phone] (fax) 12/03/2023 9:06 AM

## 2023-12-03 NOTE — Telephone Encounter (Signed)
Patient agreed she could do 12/30/2023. Sent instructions to [employer]. Sent a message to dr Cathie Hoops nurse to let her know when it is schedule

## 2023-12-03 NOTE — Telephone Encounter (Signed)
PA request has been Approved. New Encounter created for follow up. For additional info see Pharmacy Prior Auth telephone encounter from 11/30/23.

## 2023-12-03 NOTE — Telephone Encounter (Signed)
Patient scheduled for EGD on 12/30/23. Discussed with patient plan to obtain labs on 1/14 and platelet transfusion on 1/15, pt agreeable.   Patient will see appts on Mychart, please schedule:   Labs on 1/14 (after 3) Poss platelet transfusion on 1/15

## 2023-12-03 NOTE — Telephone Encounter (Signed)
Reviewed admin instruction with patient, she declined a full education due to recently taking the medication for another procedure.

## 2023-12-03 NOTE — Telephone Encounter (Signed)
Patient successfully On[employer] and drug education provided by pharmacist. Medication scheduled to be shipped on Monday, 12/06/23, for delivery on Tuesday, 12/07/23, from [facility] to patient's address. Patient also knows to call me at [phone] with any questions or concerns regarding receiving medication or if there is any unexpected change in co-pay.    Ardeen Fillers, CPhT Oncology Pharmacy Patient Advocate  [facility]  [phone] (phone) [phone] (fax) 12/03/2023 9:08 AM

## 2023-12-03 NOTE — Progress Notes (Signed)
Patient education documented in EPIC note on 09/09/23.

## 2023-12-06 ENCOUNTER — Other Ambulatory Visit: Payer: Self-pay

## 2023-12-08 DIAGNOSIS — H65192 Other acute nonsuppurative otitis media, left ear: Secondary | ICD-10-CM | POA: Insufficient documentation

## 2023-12-08 NOTE — Assessment & Plan Note (Signed)
On Levothyroxine 137 mcg daily.  Will check TSH and free t4, free t3

## 2023-12-08 NOTE — Assessment & Plan Note (Signed)
Will treat with polymix ear drops. Patient would let us know if symptoms not improving.

## 2023-12-08 NOTE — Assessment & Plan Note (Signed)
Advised to consume healthy diet, perform regular aerobic exercise and weight loss. Lab Results  Component Value Date   CHOL 125 08/11/2023   HDL 30.70 (L) 08/11/2023   LDLCALC 73 08/11/2023   TRIG 108.0 08/11/2023   CHOLHDL 4 08/11/2023

## 2023-12-08 NOTE — Assessment & Plan Note (Signed)
Body mass index is 46.27 kg/m. Patient denies personal or family history MEN 2 or MTC. Medication side effect discussed. Advised patient to inject 0.25 mg once weekly

## 2023-12-16 ENCOUNTER — Ambulatory Visit
Admission: RE | Admit: 2023-12-16 | Discharge: 2023-12-16 | Disposition: A | Discharge status: Home / Self Care | Payer: Medicaid Other | Source: Ambulatory Visit | Attending: Physician Assistant | Admitting: Physician Assistant

## 2023-12-16 ENCOUNTER — Ambulatory Visit
Admission: RE | Admit: 2023-12-16 | Discharge: 2023-12-16 | Disposition: A | Discharge status: Home / Self Care | Payer: Medicaid Other | Attending: Family Medicine | Admitting: Family Medicine

## 2023-12-16 ENCOUNTER — Other Ambulatory Visit: Payer: Self-pay | Admitting: Physician Assistant

## 2023-12-16 DIAGNOSIS — M25551 Pain in right hip: Secondary | ICD-10-CM

## 2023-12-16 DIAGNOSIS — M25552 Pain in left hip: Secondary | ICD-10-CM | POA: Diagnosis not present

## 2023-12-19 ENCOUNTER — Other Ambulatory Visit: Payer: Self-pay | Admitting: Gastroenterology

## 2023-12-23 ENCOUNTER — Encounter: Payer: Self-pay | Admitting: Gastroenterology

## 2023-12-28 ENCOUNTER — Inpatient Hospital Stay: Payer: Medicaid Other | Attending: Oncology

## 2023-12-28 DIAGNOSIS — D500 Iron deficiency anemia secondary to blood loss (chronic): Secondary | ICD-10-CM

## 2023-12-28 DIAGNOSIS — D696 Thrombocytopenia, unspecified: Secondary | ICD-10-CM | POA: Insufficient documentation

## 2023-12-28 LAB — CBC WITH DIFFERENTIAL (CANCER CENTER ONLY)
Abs Immature Granulocytes: 0 10*3/uL (ref 0.00–0.07)
Basophils Absolute: 0 10*3/uL (ref 0.0–0.1)
Basophils Relative: 1 %
Eosinophils Absolute: 0 10*3/uL (ref 0.0–0.5)
Eosinophils Relative: 2 %
HCT: 39 % (ref 36.0–46.0)
Hemoglobin: 13.4 g/dL (ref 12.0–15.0)
Immature Granulocytes: 0 %
Lymphocytes Relative: 30 %
Lymphs Abs: 0.8 10*3/uL (ref 0.7–4.0)
MCH: 29.8 pg (ref 26.0–34.0)
MCHC: 34.4 g/dL (ref 30.0–36.0)
MCV: 86.7 fL (ref 80.0–100.0)
Monocytes Absolute: 0.3 10*3/uL (ref 0.1–1.0)
Monocytes Relative: 11 %
Neutro Abs: 1.5 10*3/uL — ABNORMAL LOW (ref 1.7–7.7)
Neutrophils Relative %: 56 %
Platelet Count: 116 10*3/uL — ABNORMAL LOW (ref 150–400)
RBC: 4.5 MIL/uL (ref 3.87–5.11)
RDW: 12.6 % (ref 11.5–15.5)
WBC Count: 2.6 10*3/uL — ABNORMAL LOW (ref 4.0–10.5)
nRBC: 0 % (ref 0.0–0.2)

## 2023-12-28 LAB — SAMPLE TO BLOOD BANK

## 2023-12-29 ENCOUNTER — Encounter: Payer: Self-pay | Admitting: Gastroenterology

## 2023-12-29 ENCOUNTER — Inpatient Hospital Stay: Payer: Medicaid Other

## 2023-12-30 ENCOUNTER — Encounter
Admission: RE | Disposition: A | Discharge status: Home / Self Care | Payer: Self-pay | Source: Home / Self Care | Attending: Gastroenterology

## 2023-12-30 ENCOUNTER — Other Ambulatory Visit: Payer: Self-pay

## 2023-12-30 ENCOUNTER — Ambulatory Visit
Admission: RE | Admit: 2023-12-30 | Discharge: 2023-12-30 | Disposition: A | Discharge status: Home / Self Care | Payer: Medicaid Other | Attending: Gastroenterology | Admitting: Gastroenterology

## 2023-12-30 ENCOUNTER — Encounter: Payer: Self-pay | Admitting: Gastroenterology

## 2023-12-30 ENCOUNTER — Ambulatory Visit: Payer: Medicaid Other | Admitting: Anesthesiology

## 2023-12-30 ENCOUNTER — Other Ambulatory Visit: Payer: Self-pay | Admitting: Gastroenterology

## 2023-12-30 DIAGNOSIS — K219 Gastro-esophageal reflux disease without esophagitis: Secondary | ICD-10-CM | POA: Diagnosis not present

## 2023-12-30 DIAGNOSIS — I8500 Esophageal varices without bleeding: Secondary | ICD-10-CM | POA: Diagnosis not present

## 2023-12-30 DIAGNOSIS — K7460 Unspecified cirrhosis of liver: Secondary | ICD-10-CM | POA: Insufficient documentation

## 2023-12-30 DIAGNOSIS — K3189 Other diseases of stomach and duodenum: Secondary | ICD-10-CM | POA: Diagnosis not present

## 2023-12-30 DIAGNOSIS — I2510 Atherosclerotic heart disease of native coronary artery without angina pectoris: Secondary | ICD-10-CM | POA: Insufficient documentation

## 2023-12-30 DIAGNOSIS — I252 Old myocardial infarction: Secondary | ICD-10-CM | POA: Diagnosis not present

## 2023-12-30 DIAGNOSIS — K766 Portal hypertension: Secondary | ICD-10-CM | POA: Insufficient documentation

## 2023-12-30 DIAGNOSIS — I8510 Secondary esophageal varices without bleeding: Secondary | ICD-10-CM | POA: Diagnosis not present

## 2023-12-30 DIAGNOSIS — E039 Hypothyroidism, unspecified: Secondary | ICD-10-CM | POA: Diagnosis not present

## 2023-12-30 DIAGNOSIS — I10 Essential (primary) hypertension: Secondary | ICD-10-CM | POA: Insufficient documentation

## 2023-12-30 DIAGNOSIS — Z79890 Hormone replacement therapy (postmenopausal): Secondary | ICD-10-CM | POA: Insufficient documentation

## 2023-12-30 DIAGNOSIS — Z79899 Other long term (current) drug therapy: Secondary | ICD-10-CM | POA: Diagnosis not present

## 2023-12-30 HISTORY — PX: ESOPHAGOGASTRODUODENOSCOPY (EGD) WITH PROPOFOL: SHX5813

## 2023-12-30 SURGERY — ESOPHAGOGASTRODUODENOSCOPY (EGD) WITH PROPOFOL
Anesthesia: General

## 2023-12-30 MED ORDER — LIDOCAINE HCL (PF) 2 % IJ SOLN
INTRAMUSCULAR | Status: AC
  Filled 2023-12-30: qty 5

## 2023-12-30 MED ORDER — PROPOFOL 500 MG/50ML IV EMUL
INTRAVENOUS | Status: DC | PRN
  Administered 2023-12-30: 100 ug/kg/min via INTRAVENOUS

## 2023-12-30 MED ORDER — SODIUM CHLORIDE 0.9% FLUSH: 10.0000 mL | Freq: Two times a day (BID) | INTRAVENOUS | Status: DC

## 2023-12-30 MED ORDER — LIDOCAINE HCL (CARDIAC) PF 100 MG/5ML IV SOSY
PREFILLED_SYRINGE | INTRAVENOUS | Status: DC | PRN
  Administered 2023-12-30: 100 mg via INTRAVENOUS

## 2023-12-30 MED ORDER — GLYCOPYRROLATE 0.2 MG/ML IJ SOLN
INTRAMUSCULAR | Status: DC | PRN
  Administered 2023-12-30: .2 mg via INTRAVENOUS

## 2023-12-30 MED ORDER — DEXMEDETOMIDINE HCL IN NACL 80 MCG/20ML IV SOLN
INTRAVENOUS | Status: DC | PRN
  Administered 2023-12-30: 12 ug via INTRAVENOUS
  Administered 2023-12-30: 8 ug via INTRAVENOUS

## 2023-12-30 MED ORDER — PROPOFOL 1000 MG/100ML IV EMUL
INTRAVENOUS | Status: AC
  Filled 2023-12-30: qty 100

## 2023-12-30 MED ORDER — PROPOFOL 10 MG/ML IV BOLUS
INTRAVENOUS | Status: DC | PRN
  Administered 2023-12-30: 80 mg via INTRAVENOUS

## 2023-12-30 MED ORDER — SODIUM CHLORIDE 0.9 % IV SOLN: INTRAVENOUS | Status: DC

## 2023-12-30 MED ORDER — GLYCOPYRROLATE 0.2 MG/ML IJ SOLN
INTRAMUSCULAR | Status: AC
  Filled 2023-12-30: qty 1

## 2023-12-30 NOTE — Anesthesia Postprocedure Evaluation (Signed)
Anesthesia Post Note  Patient: Brandi Bright  Procedure(s) Performed: ESOPHAGOGASTRODUODENOSCOPY (EGD) WITH PROPOFOL  Patient location during evaluation: Endoscopy Anesthesia Type: General Level of consciousness: awake and alert Pain management: pain level controlled Vital Signs Assessment: post-procedure vital signs reviewed and stable Respiratory status: spontaneous breathing, nonlabored ventilation and respiratory function stable Cardiovascular status: blood pressure returned to baseline and stable Postop Assessment: no apparent nausea or vomiting Anesthetic complications: no   No notable events documented.   Last Vitals:  Vitals:   12/30/23 0951 12/30/23 1001  BP: (!) 96/58 93/62  Pulse: 71 66  Resp: 19 20  Temp:    SpO2: 96% 96%    Last Pain:  Vitals:   12/30/23 1001  TempSrc:   PainSc: 0-No pain                 Foye Deer

## 2023-12-30 NOTE — H&P (Signed)
[name], MD [address]  [address]  [place], [place] [address]  Main: [phone]  Fax: [phone] Pager: [phone]  Primary Care Physician:  Bethanie Dicker, NP Primary Gastroenterologist:  Dr. [name]  Pre-Procedure History & Physical: HPI:  Brandi Bright is a 53 y.o. female is here for an endoscopy.   Past Medical History:  Diagnosis Date   Arthritis    B12 deficiency 07/20/2023   Back pain    Chronic knee pain    Cirrhosis (HCC)    Dysphagia    Dyspnea    GI bleed 06/24/2019   Gout    Heart attack (HCC)    History of blood transfusion    HLD (hyperlipidemia)    Hypothyroidism    IDA (iron deficiency anemia) 01/16/2020   Iron deficiency anemia due to chronic blood loss 01/16/2020   Leukopenia 09/23/2018   MI, old    Oropharyngeal dysphagia 04/07/2021   Pleurisy 01/13/2022   Sinusitis 04/07/2021   Stomach irritation    Thyroid disease     Past Surgical History:  Procedure Laterality Date   CARDIAC CATHETERIZATION     CESAREAN SECTION     COLONOSCOPY WITH PROPOFOL N/A 03/16/2018   Procedure: COLONOSCOPY WITH PROPOFOL;  Surgeon: Pasty Spillers, MD;  Location: [facility] ENDOSCOPY;  Service: Endoscopy;  Laterality: N/A;   COLONOSCOPY WITH PROPOFOL N/A 07/09/2021   Procedure: COLONOSCOPY WITH PROPOFOL;  Surgeon: Pasty Spillers, MD;  Location: [facility] ENDOSCOPY;  Service: Endoscopy;  Laterality: N/A;   ESOPHAGOGASTRODUODENOSCOPY N/A 10/11/2019   Procedure: ESOPHAGOGASTRODUODENOSCOPY (EGD);  Surgeon: Toney Reil, MD;  Location: [facility] ENDOSCOPY;  Service: Gastroenterology;  Laterality: N/A;   ESOPHAGOGASTRODUODENOSCOPY (EGD) WITH PROPOFOL N/A 10/27/2018   Procedure: ESOPHAGOGASTRODUODENOSCOPY (EGD) WITH PROPOFOL;  Surgeon: Pasty Spillers, MD;  Location: [facility] ENDOSCOPY;  Service: Endoscopy;  Laterality: N/A;   ESOPHAGOGASTRODUODENOSCOPY (EGD) WITH PROPOFOL N/A 06/25/2019   Procedure: ESOPHAGOGASTRODUODENOSCOPY (EGD) WITH PROPOFOL;   Surgeon: Wyline Mood, MD;  Location: [facility] ENDOSCOPY;  Service: Gastroenterology;  Laterality: N/A;   ESOPHAGOGASTRODUODENOSCOPY (EGD) WITH PROPOFOL N/A 09/12/2019   Procedure: ESOPHAGOGASTRODUODENOSCOPY (EGD) WITH PROPOFOL;  Surgeon: Midge Minium, MD;  Location: [facility] ENDOSCOPY;  Service: Endoscopy;  Laterality: N/A;   ESOPHAGOGASTRODUODENOSCOPY (EGD) WITH PROPOFOL N/A 11/20/2019   Procedure: ESOPHAGOGASTRODUODENOSCOPY (EGD) WITH PROPOFOL;  Surgeon: Pasty Spillers, MD;  Location: [facility] ENDOSCOPY;  Service: Endoscopy;  Laterality: N/A;   ESOPHAGOGASTRODUODENOSCOPY (EGD) WITH PROPOFOL N/A 11/23/2019   Procedure: ESOPHAGOGASTRODUODENOSCOPY (EGD) WITH PROPOFOL;  Surgeon: Pasty Spillers, MD;  Location: [facility] ENDOSCOPY;  Service: Endoscopy;  Laterality: N/A;   ESOPHAGOGASTRODUODENOSCOPY (EGD) WITH PROPOFOL N/A 01/03/2020   Procedure: ESOPHAGOGASTRODUODENOSCOPY (EGD) WITH PROPOFOL;  Surgeon: Toney Reil, MD;  Location: [facility] ENDOSCOPY;  Service: Endoscopy;  Laterality: N/A;   ESOPHAGOGASTRODUODENOSCOPY (EGD) WITH PROPOFOL N/A 07/09/2021   Procedure: ESOPHAGOGASTRODUODENOSCOPY (EGD) WITH PROPOFOL;  Surgeon: Pasty Spillers, MD;  Location: [facility] ENDOSCOPY;  Service: Endoscopy;  Laterality: N/A;   ESOPHAGOGASTRODUODENOSCOPY (EGD) WITH PROPOFOL N/A 05/17/2023   Procedure: ESOPHAGOGASTRODUODENOSCOPY (EGD) WITH PROPOFOL;  Surgeon: Toney Reil, MD;  Location: [facility] ENDOSCOPY;  Service: Gastroenterology;  Laterality: N/A;   ESOPHAGOGASTRODUODENOSCOPY (EGD) WITH PROPOFOL N/A 09/27/2023   Procedure: ESOPHAGOGASTRODUODENOSCOPY (EGD) WITH PROPOFOL;  Surgeon: Toney Reil, MD;  Location: [facility] ENDOSCOPY;  Service: Gastroenterology;  Laterality: N/A;   ESOPHAGOGASTRODUODENOSCOPY (EGD) WITH PROPOFOL N/A 12/02/2023   Procedure: ESOPHAGOGASTRODUODENOSCOPY (EGD) WITH PROPOFOL;  Surgeon: Toney Reil, MD;  Location: [facility] ENDOSCOPY;  Service: Gastroenterology;  Laterality: N/A;   HEMOSTASIS  CLIP  PLACEMENT  09/27/2023   Procedure: HEMOSTASIS CLIP PLACEMENT;  Surgeon: Toney Reil, MD;  Location: [facility] ENDOSCOPY;  Service: Gastroenterology;;   POLYPECTOMY  09/27/2023   Procedure: POLYPECTOMY;  Surgeon: Toney Reil, MD;  Location: [facility] ENDOSCOPY;  Service: Gastroenterology;;   TUBAL LIGATION      Prior to Admission medications   Medication Sig Start Date End Date Taking? Authorizing Provider  avatrombopag maleate (DOPTELET) 20 MG tablet Take 3 tablets (60 mg total) by mouth daily. Take for 5 days. Take with food. Beginning 10 to 13 days prior to scheduled procedure. 12/02/23  Yes Rickard Patience, MD  Cyanocobalamin (B-12) 2500 MCG SUBL Place 1 tablet under the tongue daily. 09/08/23  Yes Rickard Patience, MD  furosemide (LASIX) 20 MG tablet Take 1 tablet (20 mg total) by mouth daily. 11/15/23  Yes Sentoria Brent, Loel Dubonnet, MD  levothyroxine (SYNTHROID) 125 MCG tablet Take 1 tablet (125 mcg total) by mouth daily. 12/01/23  Yes Kara Dies, NP  omeprazole (PRILOSEC) 40 MG capsule TAKE 1 CAPSULE (40 MG TOTAL) BY MOUTH DAILY. 11/17/23  Yes Jayelyn Barno, Loel Dubonnet, MD  oxyCODONE (OXYCONTIN) 15 mg 12 hr tablet 15 mg every 12 (twelve) hours. 06/09/22  Yes [provider]  Oxycodone HCl 10 MG TABS Take 10 mg by mouth 4 (four) times daily as needed. 10/14/20  Yes [provider]  spironolactone (ALDACTONE) 50 MG tablet TAKE 1 TABLET BY MOUTH EVERY DAY 12/20/23  Yes Cintya Daughety, Loel Dubonnet, MD  aspirin EC 81 MG tablet Take 1 tablet (81 mg total) by mouth daily. Swallow whole. Patient not taking: Reported on 08/11/2023 04/01/23 03/31/24  Lucile Shutters, MD  levothyroxine (SYNTHROID) 137 MCG tablet Take 1 tablet (137 mcg total) by mouth daily before breakfast. D/C 150 mcg 08/12/23   Bethanie Dicker, NP  levothyroxine (SYNTHROID) 150 MCG tablet Take 150 mcg by mouth daily. Patient not taking: Reported on 11/26/2023 09/30/23   [provider]  NEOMYCIN-POLYMYXIN-HYDROCORTISONE (CORTISPORIN)  1 % SOLN OTIC solution 4 gtt in affected ear(s) tid, max of 10 days. Lie with affected ear upward x 5 minutes 11/26/23   Kara Dies, NP  nitroGLYCERIN (NITROLINGUAL) 0.4 MG/SPRAY spray Place 1 spray under the tongue every 5 (five) minutes x 3 doses as needed for chest pain. 04/24/20   Corky Downs, MD  Semaglutide-Weight Management (WEGOVY) 0.25 MG/0.5ML SOAJ Inject 0.25 mg into the skin once a week. 11/26/23   Kara Dies, NP  ZTLIDO 1.8 % PTCH Apply 1 patch topically at bedtime. 07/10/22   [provider]    Allergies as of 12/03/2023 - Review Complete 12/02/2023  Allergen Reaction Noted   Vicodin [hydrocodone-acetaminophen] Rash 01/11/2016   Amoxicillin Rash and Other (See Comments) 01/11/2016   Gabapentin Rash 01/11/2016    Family History  Problem Relation Age of Onset   Stroke Mother    Hypertension Mother    Hyperlipidemia Mother    Diabetes Mother    COPD Mother    Cancer Mother    Arthritis Mother    Hypertension Father    COPD Father    Cancer Father    CAD Father    Colon cancer Father    Lung cancer Father    Heart attack Father    Stroke Sister    Kidney disease Sister    Hypertension Sister    Hyperlipidemia Sister    Diabetes Sister    COPD Sister    Arthritis Sister    Hyperlipidemia Sister    Arthritis Sister  Stroke Sister    Hypertension Sister    Hyperlipidemia Sister    Heart disease Sister    Heart attack Sister    Diabetes Sister    COPD Sister    Asthma Sister    Arthritis Sister    Breast cancer Maternal Aunt 98 - 68   Arthritis Maternal Grandmother    Arthritis Maternal Grandfather    Arthritis Paternal Grandmother    Arthritis Paternal Grandfather    Breast cancer Cousin 61 - 26   Stroke Brother    Hyperlipidemia Brother    COPD Brother    Arthritis Brother    Pancreatic cancer Brother    Hypertension Other     Social History   Socioeconomic History   Marital status: Single    Spouse name: Not on file    Number of children: Not on file   Years of education: Not on file   Highest education level: Not on file  Occupational History   Not on file  Tobacco Use   Smoking status: Never   Smokeless tobacco: Never  Vaping Use   Vaping status: Never Used  Substance and Sexual Activity   Alcohol use: Yes    Comment: occ   Drug use: Yes    Comment: prescribed oxycodone and oxycotin   Sexual activity: Not Currently  Other Topics Concern   Not on file  Social History Narrative   Not on file   Social Drivers of Health   Financial Resource Strain: Medium Risk (10/11/2019)   Overall Financial Resource Strain (CARDIA)    Difficulty of Paying Living Expenses: Somewhat hard  Food Insecurity: No Food Insecurity (04/01/2023)   Hunger Vital Sign    Worried About Running Out of Food in the Last Year: Never true    Ran Out of Food in the Last Year: Never true  Transportation Needs: No Transportation Needs (04/01/2023)   PRAPARE - Administrator, Civil Service (Medical): No    Lack of Transportation (Non-Medical): No  Physical Activity: Inactive (10/11/2019)   Exercise Vital Sign    Days of Exercise per Week: 0 days    Minutes of Exercise per Session: 0 min  Stress: Stress Concern Present (10/11/2019)   [employer] of Occupational Health - Occupational Stress Questionnaire    Feeling of Stress : Rather much  Social Connections: Unknown (10/11/2019)   Social Connection and Isolation Panel [NHANES]    Frequency of Communication with Friends and Family: More than three times a week    Frequency of Social Gatherings with Friends and Family: More than three times a week    Attends Religious Services: Never    Database administrator or Organizations: No    Attends Engineer, structural: Not asked    Marital Status: Not on file  Intimate Partner Violence: Not At Risk (04/01/2023)   Humiliation, Afraid, Rape, and Kick questionnaire    Fear of Current or Ex-Partner: No     Emotionally Abused: No    Physically Abused: No    Sexually Abused: No    Review of Systems: See HPI, otherwise negative ROS  Physical Exam: BP 122/77   Pulse 66   Temp (!) 97 F (36.1 C)   Wt 93.9 kg   LMP  (LMP Unknown) Comment: last menstrual 9 years ago, tubal ligation  SpO2 98%   BMI 44.79 kg/m  General:   Alert,  pleasant and cooperative in NAD Head:  Normocephalic and atraumatic. Neck:  Supple;  no masses or thyromegaly. Lungs:  Clear throughout to auscultation.    Heart:  Regular rate and rhythm. Abdomen:  Soft, nontender and nondistended. Normal bowel sounds, without guarding, and without rebound.   Neurologic:  Alert and  oriented x4;  grossly normal neurologically.  Impression/Plan: Brandi Bright is here for an endoscopy to be performed for cirrhosis of liver, esophageal varices  Risks, benefits, limitations, and alternatives regarding  endoscopy have been reviewed with the patient.  Questions have been answered.  All parties agreeable.   Lannette Donath, MD  12/30/2023, 9:13 AM

## 2023-12-30 NOTE — Op Note (Signed)
[facility] Gastroenterology Patient Name: Brandi Bright Procedure Date: 12/30/2023 9:10 AM MRN: [id number] Account #: [id number] Date of Birth: September 02, 1971 Admit Type: Outpatient Age: 53 Room: [facility] ENDO ROOM 3 Gender: Female Note Status: Finalized Instrument Name: Upper Endoscope [id number] Procedure:             Upper GI endoscopy Indications:           Esophageal varices, Follow-up of esophageal varices Providers:             Toney Reil MD, MD Referring MD:          Bethanie Dicker (Referring MD) Medicines:             General Anesthesia Complications:         No immediate complications. Estimated blood loss: None. Procedure:             Pre-Anesthesia Assessment:                        - Prior to the procedure, a History and Physical was                         performed, and patient medications and allergies were                         reviewed. The patient is competent. The risks and                         benefits of the procedure and the sedation options and                         risks were discussed with the patient. All questions                         were answered and informed consent was obtained.                         Patient identification and proposed procedure were                         verified by the physician, the nurse, the                         anesthesiologist, the anesthetist and the technician                         in the pre-procedure area in the procedure room in the                         endoscopy suite. Mental Status Examination: alert and                         oriented. Airway Examination: normal oropharyngeal                         airway and neck mobility. Respiratory Examination:                         clear to auscultation. CV Examination: normal.  Prophylactic Antibiotics: The patient does not require                         prophylactic antibiotics. Prior Anticoagulants: The                          patient has taken no anticoagulant or antiplatelet                         agents. ASA Grade Assessment: III - A patient with                         severe systemic disease. After reviewing the risks and                         benefits, the patient was deemed in satisfactory                         condition to undergo the procedure. The anesthesia                         plan was to use general anesthesia. Immediately prior                         to administration of medications, the patient was                         re-assessed for adequacy to receive sedatives. The                         heart rate, respiratory rate, oxygen saturations,                         blood pressure, adequacy of pulmonary ventilation, and                         response to care were monitored throughout the                         procedure. The physical status of the patient was                         re-assessed after the procedure.                        After obtaining informed consent, the endoscope was                         passed under direct vision. Throughout the procedure,                         the patient's blood pressure, pulse, and oxygen                         saturations were monitored continuously. The Endoscope                         was introduced through the mouth, and advanced to the  second part of duodenum. The upper GI endoscopy was                         accomplished without difficulty. The patient tolerated                         the procedure well. Findings:      The duodenal bulb and second portion of the duodenum were normal.      Severe, diffuse portal hypertensive gastropathy was found in the gastric       body and in the gastric antrum.      One column of small, clinically insignificant (< 5 mm) varices with no       bleeding and no stigmata of recent bleeding were found in the lower       third of the esophagus, 35 cm from the  incisors. No red wale signs were       present. Scarring from prior treatment was visible. The varices were       almost negligible than they were at prior exam.      The cardia and gastric fundus were normal on retroflexion. Impression:            - Normal duodenal bulb and second portion of the                         duodenum.                        - Portal hypertensive gastropathy.                        - Small (< 5 mm) esophageal varices with no bleeding                         and no stigmata of recent bleeding.                        - No specimens collected. Recommendation:        - Discharge patient to home (with escort).                        - Low sodium diet.                        - Continue present medications.                        - Return to my office as previously scheduled.                        - Repeat upper endoscopy in 6 months for variceal                         surveillance. Procedure Code(s):     --- Professional ---                        [phone], Esophagogastroduodenoscopy, flexible,                         transoral; diagnostic, including collection of  specimen(s) by brushing or washing, when performed                         (separate procedure) Diagnosis Code(s):     --- Professional ---                        K76.6, Portal hypertension                        K31.89, Other diseases of stomach and duodenum                        I85.00, Esophageal varices without bleeding CPT copyright 2022 American Medical Association. All rights reserved. The codes documented in this report are preliminary and upon coder review may  be revised to meet current compliance requirements. Dr. Libby Maw Toney Reil MD, MD 12/30/2023 9:40:39 AM This report has been signed electronically. Number of Addenda: 0 Note Initiated On: 12/30/2023 9:10 AM Estimated Blood Loss:  Estimated blood loss: none.      [facility]

## 2023-12-30 NOTE — Anesthesia Preprocedure Evaluation (Signed)
Anesthesia Evaluation  Patient identified by MRN, date of birth, ID band Patient awake    Reviewed: Allergy & Precautions, H&P , NPO status , Patient's Chart, lab work & pertinent test results  Airway Mallampati: II  TM Distance: >3 FB Neck ROM: full    Dental no notable dental hx. (+) Missing,    Pulmonary shortness of breath and with exertion   Pulmonary exam normal        Cardiovascular Exercise Tolerance: Poor hypertension, (-) angina + CAD, + Past MI and + DOE  Normal cardiovascular exam     Neuro/Psych  PSYCHIATRIC DISORDERS      negative neurological ROS     GI/Hepatic ,GERD  Controlled and Medicated,,(+) Cirrhosis         Endo/Other  Hypothyroidism    Renal/GU negative Renal ROS  negative genitourinary   Musculoskeletal  (+) Arthritis ,    Abdominal  (+) + obese  Peds  Hematology negative hematology ROS (+)   Anesthesia Other Findings Past Medical History: No date: Arthritis 07/20/2023: B12 deficiency No date: Back pain No date: Chronic knee pain No date: Cirrhosis (HCC) No date: Dysphagia No date: Dyspnea 06/24/2019: GI bleed No date: Gout No date: Heart attack (HCC) No date: History of blood transfusion No date: HLD (hyperlipidemia) No date: Hypothyroidism 01/16/2020: IDA (iron deficiency anemia) 01/16/2020: Iron deficiency anemia due to chronic blood loss 09/23/2018: Leukopenia No date: MI, old 04/07/2021: Oropharyngeal dysphagia 01/13/2022: Pleurisy 04/07/2021: Sinusitis No date: Stomach irritation No date: Thyroid disease  Past Surgical History: No date: CARDIAC CATHETERIZATION No date: CESAREAN SECTION 03/16/2018: COLONOSCOPY WITH PROPOFOL; N/A     Comment:  Procedure: COLONOSCOPY WITH PROPOFOL;  Surgeon:               Pasty Spillers, MD;  Location: [facility] ENDOSCOPY;                Service: Endoscopy;  Laterality: N/A; 07/09/2021: COLONOSCOPY WITH PROPOFOL; N/A     Comment:   Procedure: COLONOSCOPY WITH PROPOFOL;  Surgeon:               Pasty Spillers, MD;  Location: [facility] ENDOSCOPY;                Service: Endoscopy;  Laterality: N/A; 10/11/2019: ESOPHAGOGASTRODUODENOSCOPY; N/A     Comment:  Procedure: ESOPHAGOGASTRODUODENOSCOPY (EGD);  Surgeon:               Toney Reil, MD;  Location: [facility] ENDOSCOPY;                Service: Gastroenterology;  Laterality: N/A; 10/27/2018: ESOPHAGOGASTRODUODENOSCOPY (EGD) WITH PROPOFOL; N/A     Comment:  Procedure: ESOPHAGOGASTRODUODENOSCOPY (EGD) WITH               PROPOFOL;  Surgeon: Pasty Spillers, MD;  Location:               [facility] ENDOSCOPY;  Service: Endoscopy;  Laterality: N/A; 06/25/2019: ESOPHAGOGASTRODUODENOSCOPY (EGD) WITH PROPOFOL; N/A     Comment:  Procedure: ESOPHAGOGASTRODUODENOSCOPY (EGD) WITH               PROPOFOL;  Surgeon: Wyline Mood, MD;  Location: [facility]               ENDOSCOPY;  Service: Gastroenterology;  Laterality: N/A; 09/12/2019: ESOPHAGOGASTRODUODENOSCOPY (EGD) WITH PROPOFOL; N/A     Comment:  Procedure: ESOPHAGOGASTRODUODENOSCOPY (EGD) WITH               PROPOFOL;  Surgeon: Midge Minium, MD;  Location: [facility]               ENDOSCOPY;  Service: Endoscopy;  Laterality: N/A; 11/20/2019: ESOPHAGOGASTRODUODENOSCOPY (EGD) WITH PROPOFOL; N/A     Comment:  Procedure: ESOPHAGOGASTRODUODENOSCOPY (EGD) WITH               PROPOFOL;  Surgeon: Pasty Spillers, MD;  Location:               [facility] ENDOSCOPY;  Service: Endoscopy;  Laterality: N/A; 11/23/2019: ESOPHAGOGASTRODUODENOSCOPY (EGD) WITH PROPOFOL; N/A     Comment:  Procedure: ESOPHAGOGASTRODUODENOSCOPY (EGD) WITH               PROPOFOL;  Surgeon: Pasty Spillers, MD;  Location:               [facility] ENDOSCOPY;  Service: Endoscopy;  Laterality: N/A; 01/03/2020: ESOPHAGOGASTRODUODENOSCOPY (EGD) WITH PROPOFOL; N/A     Comment:  Procedure: ESOPHAGOGASTRODUODENOSCOPY (EGD) WITH               PROPOFOL;  Surgeon: Toney Reil, MD;   Location:               [facility] ENDOSCOPY;  Service: Endoscopy;  Laterality: N/A; 07/09/2021: ESOPHAGOGASTRODUODENOSCOPY (EGD) WITH PROPOFOL; N/A     Comment:  Procedure: ESOPHAGOGASTRODUODENOSCOPY (EGD) WITH               PROPOFOL;  Surgeon: Pasty Spillers, MD;  Location:               [facility] ENDOSCOPY;  Service: Endoscopy;  Laterality: N/A; 05/17/2023: ESOPHAGOGASTRODUODENOSCOPY (EGD) WITH PROPOFOL; N/A     Comment:  Procedure: ESOPHAGOGASTRODUODENOSCOPY (EGD) WITH               PROPOFOL;  Surgeon: Toney Reil, MD;  Location:               [facility] ENDOSCOPY;  Service: Gastroenterology;  Laterality:               N/A; 09/27/2023: ESOPHAGOGASTRODUODENOSCOPY (EGD) WITH PROPOFOL; N/A     Comment:  Procedure: ESOPHAGOGASTRODUODENOSCOPY (EGD) WITH               PROPOFOL;  Surgeon: Toney Reil, MD;  Location:               [facility] ENDOSCOPY;  Service: Gastroenterology;  Laterality:               N/A; 12/02/2023: ESOPHAGOGASTRODUODENOSCOPY (EGD) WITH PROPOFOL; N/A     Comment:  Procedure: ESOPHAGOGASTRODUODENOSCOPY (EGD) WITH               PROPOFOL;  Surgeon: Toney Reil, MD;  Location:               [facility] ENDOSCOPY;  Service: Gastroenterology;  Laterality:               N/A; 09/27/2023: HEMOSTASIS CLIP PLACEMENT     Comment:  Procedure: HEMOSTASIS CLIP PLACEMENT;  Surgeon: Toney Reil, MD;  Location: [facility] ENDOSCOPY;  Service:               Gastroenterology;; 09/27/2023: POLYPECTOMY     Comment:  Procedure: POLYPECTOMY;  Surgeon: Toney Reil,               MD;  Location: [facility] ENDOSCOPY;  Service:  Gastroenterology;; No date: TUBAL LIGATION  BMI    Body Mass Index: 44.79 kg/m      Reproductive/Obstetrics negative OB ROS                             Anesthesia Physical Anesthesia Plan  ASA: 3  Anesthesia Plan: General   Post-op Pain Management: Minimal or no pain anticipated   Induction:  Intravenous  PONV Risk Score and Plan: Propofol infusion and TIVA  Airway Management Planned: Natural Airway  Additional Equipment:   Intra-op Plan:   Post-operative Plan:   Informed Consent: I have reviewed the patients History and Physical, chart, labs and discussed the procedure including the risks, benefits and alternatives for the proposed anesthesia with the patient or authorized representative who has indicated his/her understanding and acceptance.     Dental Advisory Given  Plan Discussed with: CRNA and Surgeon  Anesthesia Plan Comments:         Anesthesia Quick Evaluation

## 2023-12-30 NOTE — Transfer of Care (Signed)
Immediate Anesthesia Transfer of Care Note  Patient: Brandi Bright  Procedure(s) Performed: ESOPHAGOGASTRODUODENOSCOPY (EGD) WITH PROPOFOL  Patient Location: PACU  Anesthesia Type:General  Level of Consciousness: sedated  Airway & Oxygen Therapy: Patient Spontanous Breathing and Patient connected to nasal cannula oxygen  Post-op Assessment: Report given to RN and Post -op Vital signs reviewed and stable  Post vital signs: Reviewed and stable  Last Vitals:  Vitals Value Taken Time  BP 93/56 12/30/23 0943  Temp 36.1 C 12/30/23 0941  Pulse 74 12/30/23 0944  Resp 24 12/30/23 0944  SpO2 97 % 12/30/23 0944  Vitals shown include unfiled device data.  Last Pain:  Vitals:   12/30/23 0941  TempSrc: Tympanic  PainSc: Asleep         Complications: No notable events documented.

## 2023-12-31 ENCOUNTER — Encounter: Payer: Self-pay | Admitting: Gastroenterology

## 2023-12-31 ENCOUNTER — Other Ambulatory Visit: Payer: Medicaid Other

## 2023-12-31 NOTE — Telephone Encounter (Signed)
Last office visit 11/15/2023 cirrhosis of liver  Last refill 11/17/2023 90 capsule 0 refills

## 2024-01-05 ENCOUNTER — Other Ambulatory Visit: Payer: Self-pay

## 2024-01-05 ENCOUNTER — Other Ambulatory Visit: Payer: Medicaid Other

## 2024-01-06 ENCOUNTER — Other Ambulatory Visit: Payer: Self-pay

## 2024-01-06 NOTE — Progress Notes (Signed)
Patient only needed Doptelet as one time fill. Disenrolled from [employer].    Ardeen Fillers, CPhT Oncology Pharmacy Patient Advocate  [facility]  [phone] (phone) [phone] (fax) 01/06/2024 8:19 AM

## 2024-01-19 DIAGNOSIS — M542 Cervicalgia: Secondary | ICD-10-CM | POA: Diagnosis not present

## 2024-01-19 DIAGNOSIS — M5451 Vertebrogenic low back pain: Secondary | ICD-10-CM | POA: Diagnosis not present

## 2024-01-19 DIAGNOSIS — Z79891 Long term (current) use of opiate analgesic: Secondary | ICD-10-CM | POA: Diagnosis not present

## 2024-01-19 DIAGNOSIS — G894 Chronic pain syndrome: Secondary | ICD-10-CM | POA: Diagnosis not present

## 2024-01-19 DIAGNOSIS — M25569 Pain in unspecified knee: Secondary | ICD-10-CM | POA: Diagnosis not present

## 2024-01-26 ENCOUNTER — Ambulatory Visit: Payer: Medicaid Other | Admitting: Nurse Practitioner

## 2024-01-26 ENCOUNTER — Encounter: Payer: Self-pay | Admitting: Nurse Practitioner

## 2024-01-26 VITALS — BP 100/60 | HR 78 | Temp 98.0°F | Ht <= 58 in | Wt 209.4 lb

## 2024-01-26 DIAGNOSIS — Z6841 Body Mass Index (BMI) 40.0 and over, adult: Secondary | ICD-10-CM | POA: Diagnosis not present

## 2024-01-26 DIAGNOSIS — E039 Hypothyroidism, unspecified: Secondary | ICD-10-CM | POA: Diagnosis not present

## 2024-01-26 MED ORDER — WEGOVY 1 MG/0.5ML ~~LOC~~ SOAJ: 1.0000 mg | SUBCUTANEOUS | 0 refills | Status: DC

## 2024-01-26 MED ORDER — WEGOVY 0.5 MG/0.5ML ~~LOC~~ SOAJ: 0.5000 mg | SUBCUTANEOUS | 0 refills | Status: DC

## 2024-01-26 NOTE — Assessment & Plan Note (Signed)
Previous low TSH levels are noted, and Levothyroxine is currently being taken. No symptoms of hyperthyroidism are reported. Check thyroid function tests today. Refill Levothyroxine based on the test results.

## 2024-01-26 NOTE — Assessment & Plan Note (Signed)
There has been a 4-pound weight loss on Wegovy 0.25mg  over 2 months with no side effects. Dietary habits have improved, and water intake has increased, alongside a minimal home exercise regimen. Continue Wegovy 0.25mg  weekly for another month as the medication is already available. Prescriptions for Wegovy 0.5mg  and 1mg  will be sent for future use. Counseled on the risk of pancreatitis and gallbladder disease. Discussed the risk of nausea. Advised to discontinue the [facility] and contact us immediately if they develop abdominal pain. If they develop excessive nausea they will contact us right away. I discussed that medullary thyroid cancer has been seen in rats studies. The patient confirmed no personal or family history of thyroid cancer, parathyroid cancer, or adrenal gland cancer. Discussed that we thus far have not seen medullary thyroid cancer result from use of this type of medication in humans. Advised to monitor the thyroid area and contact us for any lumps, swelling, trouble swallowing, or any other changes in this area.  Discussed goal weight loss of 1 to 2 pounds a week while on this medication. Discussed the importance of healthy diet, exercise and lifestyle modifications even with this medication. Encourage maintaining improved dietary habits and increased physical activity.

## 2024-01-26 NOTE — Progress Notes (Signed)
Brandi Dicker, NP-C Phone: [phone]  Brandi Bright is a 53 y.o. female who presents today for follow up.   Discussed the use of AI scribe software for clinical note transcription with the patient, who gave verbal consent to proceed.  History of Present Illness   Brandi Bright is a 53 year old female who presents for follow-up on thyroid management and weight loss with [id number].  She started [facility] approximately one month ago after delaying the start due to an endoscopic procedure. She began with a dose of 0.25 mg and has completed four weeks at this dose without experiencing any side effects such as nausea or gastrointestinal issues. She has lost four pounds since starting the medication, going from 213 pounds to 209 pounds. Her diet has changed significantly; she no longer feels the need to snack throughout the day and is satisfied with a small brunch until the evening. She has increased her water intake and eliminated tea from her diet. She has also started incorporating home exercises, following routines she finds online, as she cannot attend a gym due to her daughter's schedule.  Her thyroid management includes a recent reduction in her Levothyroxine dose from 137 mcg to 125 mcg due to low TSH levels. She takes her medication every morning on an empty stomach, at least 30 minutes before eating, and adjusts the timing based on her schedule. No symptoms such as heart palpitations or temperature changes.  There is a family history of diabetes, with her daughter and son being borderline diabetic, and her mother having had diabetes. Her A1c was checked in August and was 5.0, indicating no current issues with blood sugar levels. She mentions that her family history of diabetes is a concern, but she has not been diagnosed with it.      Social History   Tobacco Use  Smoking Status Never  Smokeless Tobacco Never    Current Outpatient Medications on File Prior to Visit  Medication Sig Dispense  Refill   avatrombopag maleate (DOPTELET) 20 MG tablet Take 3 tablets (60 mg total) by mouth daily. Take for 5 days. Take with food. Beginning 10 to 13 days prior to scheduled procedure. 15 tablet 0   Cyanocobalamin (B-12) 2500 MCG SUBL Place 1 tablet under the tongue daily. 90 tablet 1   furosemide (LASIX) 20 MG tablet Take 1 tablet (20 mg total) by mouth daily. 30 tablet 2   levothyroxine (SYNTHROID) 125 MCG tablet Take 1 tablet (125 mcg total) by mouth daily. 90 tablet 3   NEOMYCIN-POLYMYXIN-HYDROCORTISONE (CORTISPORIN) 1 % SOLN OTIC solution 4 gtt in affected ear(s) tid, max of 10 days. Lie with affected ear upward x 5 minutes 10 mL 0   nitroGLYCERIN (NITROLINGUAL) 0.4 MG/SPRAY spray Place 1 spray under the tongue every 5 (five) minutes x 3 doses as needed for chest pain. 12 g 2   omeprazole (PRILOSEC) 40 MG capsule TAKE 1 CAPSULE (40 MG TOTAL) BY MOUTH DAILY. 90 capsule 1   oxyCODONE (OXYCONTIN) 15 mg 12 hr tablet 15 mg every 12 (twelve) hours.     Oxycodone HCl 10 MG TABS Take 10 mg by mouth 4 (four) times daily as needed.     Semaglutide-Weight Management (WEGOVY) 0.25 MG/0.5ML SOAJ Inject 0.25 mg into the skin once a week. 2 mL 1   spironolactone (ALDACTONE) 50 MG tablet TAKE 1 TABLET BY MOUTH EVERY DAY 90 tablet 1   ZTLIDO 1.8 % PTCH Apply 1 patch topically at bedtime.     No  current facility-administered medications on file prior to visit.    ROS see history of present illness  Objective  Physical Exam Vitals:   01/26/24 0909  BP: 100/60  Pulse: 78  Temp: 98 F (36.7 C)  SpO2: 97%    BP Readings from Last 3 Encounters:  01/26/24 100/60  12/30/23 93/62  12/02/23 127/74   Wt Readings from Last 3 Encounters:  01/26/24 209 lb 6.4 oz (95 kg)  12/30/23 207 lb (93.9 kg)  12/02/23 213 lb 9.6 oz (96.9 kg)    Physical Exam Constitutional:      General: She is not in acute distress.    Appearance: Normal appearance.  HENT:     Head: Normocephalic.  Cardiovascular:      Rate and Rhythm: Normal rate and regular rhythm.     Heart sounds: Normal heart sounds.  Pulmonary:     Effort: Pulmonary effort is normal.     Breath sounds: Normal breath sounds.  Skin:    General: Skin is warm and dry.  Neurological:     General: No focal deficit present.     Mental Status: She is alert.  Psychiatric:        Mood and Affect: Mood normal.        Behavior: Behavior normal.    Assessment/Plan: Please see individual problem list.  Acquired hypothyroidism Assessment & Plan: Previous low TSH levels are noted, and Levothyroxine is currently being taken. No symptoms of hyperthyroidism are reported. Check thyroid function tests today. Refill Levothyroxine based on the test results.  Orders: -     TSH+T4F+T3Free+ThyAbs+TPO+VD25  Obesity, Class III, BMI 40-49.9 (morbid obesity) (HCC) Assessment & Plan: There has been a 4-pound weight loss on Wegovy 0.25mg  over 2 months with no side effects. Dietary habits have improved, and water intake has increased, alongside a minimal home exercise regimen. Continue Wegovy 0.25mg  weekly for another month as the medication is already available. Prescriptions for Wegovy 0.5mg  and 1mg  will be sent for future use. Counseled on the risk of pancreatitis and gallbladder disease. Discussed the risk of nausea. Advised to discontinue the [facility] and contact us immediately if they develop abdominal pain. If they develop excessive nausea they will contact us right away. I discussed that medullary thyroid cancer has been seen in rats studies. The patient confirmed no personal or family history of thyroid cancer, parathyroid cancer, or adrenal gland cancer. Discussed that we thus far have not seen medullary thyroid cancer result from use of this type of medication in humans. Advised to monitor the thyroid area and contact us for any lumps, swelling, trouble swallowing, or any other changes in this area.  Discussed goal weight loss of 1 to 2  pounds a week while on this medication. Discussed the importance of healthy diet, exercise and lifestyle modifications even with this medication. Encourage maintaining improved dietary habits and increased physical activity.   Orders: -     Wegovy; Inject 0.5 mg into the skin once a week.  Dispense: 2 mL; Refill: 0 -     Wegovy; Inject 1 mg into the skin once a week.  Dispense: 2 mL; Refill: 0    Return in about 3 months (around 04/24/2024) for Follow up.   Brandi Dicker, NP-C [facility]

## 2024-01-27 LAB — TSH+T4F+T3FREE+THYABS+TPO+VD25
Free T4: 1.96 ng/dL — ABNORMAL HIGH (ref 0.82–1.77)
T3, Free: 3.5 pg/mL (ref 2.0–4.4)
TSH: 0.006 u[IU]/mL — ABNORMAL LOW (ref 0.450–4.500)
Thyroglobulin Antibody: <1 [IU]/mL (ref 0.0–0.9)
Thyroperoxidase Ab SerPl-aCnc: 76 [IU]/mL — ABNORMAL HIGH (ref 0–34)
Vit D, 25-Hydroxy: 42.3 ng/mL (ref 30.0–100.0)

## 2024-01-28 ENCOUNTER — Telehealth: Payer: Self-pay

## 2024-01-28 ENCOUNTER — Encounter: Payer: Self-pay | Admitting: Nurse Practitioner

## 2024-01-28 MED ORDER — LEVOTHYROXINE SODIUM 100 MCG PO TABS: 100.0000 ug | ORAL_TABLET | Freq: Every day | ORAL | 0 refills | Status: DC

## 2024-01-28 NOTE — Progress Notes (Signed)
Please call pt: Her TSH is still an range will reduce the thyroid medication to 100 mcg. Will recheck in 6 weeks and follow up with PCP.

## 2024-01-28 NOTE — Telephone Encounter (Signed)
No notes attached to results. Pt is checking on results

## 2024-01-28 NOTE — Telephone Encounter (Signed)
New dose of levothyroxine has been sent in.

## 2024-02-01 ENCOUNTER — Other Ambulatory Visit: Payer: Self-pay | Admitting: Nurse Practitioner

## 2024-02-01 ENCOUNTER — Encounter: Payer: Self-pay | Admitting: Gastroenterology

## 2024-02-01 ENCOUNTER — Other Ambulatory Visit: Payer: Self-pay | Admitting: Gastroenterology

## 2024-02-02 ENCOUNTER — Inpatient Hospital Stay: Payer: Medicaid Other | Attending: Oncology

## 2024-02-02 DIAGNOSIS — Z801 Family history of malignant neoplasm of trachea, bronchus and lung: Secondary | ICD-10-CM | POA: Diagnosis not present

## 2024-02-02 DIAGNOSIS — Z800 Family history of malignant neoplasm of digestive organs: Secondary | ICD-10-CM | POA: Insufficient documentation

## 2024-02-02 DIAGNOSIS — D709 Neutropenia, unspecified: Secondary | ICD-10-CM | POA: Insufficient documentation

## 2024-02-02 DIAGNOSIS — Z803 Family history of malignant neoplasm of breast: Secondary | ICD-10-CM | POA: Diagnosis not present

## 2024-02-02 DIAGNOSIS — D509 Iron deficiency anemia, unspecified: Secondary | ICD-10-CM | POA: Insufficient documentation

## 2024-02-02 DIAGNOSIS — K7460 Unspecified cirrhosis of liver: Secondary | ICD-10-CM | POA: Diagnosis not present

## 2024-02-02 DIAGNOSIS — D696 Thrombocytopenia, unspecified: Secondary | ICD-10-CM | POA: Diagnosis not present

## 2024-02-02 DIAGNOSIS — R161 Splenomegaly, not elsewhere classified: Secondary | ICD-10-CM | POA: Insufficient documentation

## 2024-02-02 DIAGNOSIS — E538 Deficiency of other specified B group vitamins: Secondary | ICD-10-CM | POA: Insufficient documentation

## 2024-02-02 LAB — HEPATIC FUNCTION PANEL
ALT: 27 U/L (ref 0–44)
AST: 37 U/L (ref 15–41)
Albumin: 3.4 g/dL — ABNORMAL LOW (ref 3.5–5.0)
Alkaline Phosphatase: 78 U/L (ref 38–126)
Bilirubin, Direct: 0.3 mg/dL — ABNORMAL HIGH (ref 0.0–0.2)
Indirect Bilirubin: 0.9 mg/dL (ref 0.3–0.9)
Total Bilirubin: 1.2 mg/dL (ref 0.0–1.2)
Total Protein: 6.1 g/dL — ABNORMAL LOW (ref 6.5–8.1)

## 2024-02-02 LAB — CBC WITH DIFFERENTIAL (CANCER CENTER ONLY)
Abs Immature Granulocytes: 0.01 10*3/uL (ref 0.00–0.07)
Basophils Absolute: 0 10*3/uL (ref 0.0–0.1)
Basophils Relative: 1 %
Eosinophils Absolute: 0.1 10*3/uL (ref 0.0–0.5)
Eosinophils Relative: 2 %
HCT: 37.8 % (ref 36.0–46.0)
Hemoglobin: 13.2 g/dL (ref 12.0–15.0)
Immature Granulocytes: 1 %
Lymphocytes Relative: 34 %
Lymphs Abs: 0.7 10*3/uL (ref 0.7–4.0)
MCH: 30.3 pg (ref 26.0–34.0)
MCHC: 34.9 g/dL (ref 30.0–36.0)
MCV: 86.7 fL (ref 80.0–100.0)
Monocytes Absolute: 0.2 10*3/uL (ref 0.1–1.0)
Monocytes Relative: 11 %
Neutro Abs: 1.1 10*3/uL — ABNORMAL LOW (ref 1.7–7.7)
Neutrophils Relative %: 51 %
Platelet Count: 43 10*3/uL — ABNORMAL LOW (ref 150–400)
RBC: 4.36 MIL/uL (ref 3.87–5.11)
RDW: 12.9 % (ref 11.5–15.5)
WBC Count: 2.1 10*3/uL — ABNORMAL LOW (ref 4.0–10.5)
nRBC: 0 % (ref 0.0–0.2)

## 2024-02-02 LAB — IRON AND TIBC
Iron: 64 ug/dL (ref 28–170)
Saturation Ratios: 15 % (ref 10.4–31.8)
TIBC: 434 ug/dL (ref 250–450)
UIBC: 370 ug/dL

## 2024-02-02 LAB — VITAMIN B12: Vitamin B-12: 272 pg/mL (ref 180–914)

## 2024-02-02 MED ORDER — SPIRONOLACTONE 25 MG PO TABS: 25.0000 mg | ORAL_TABLET | Freq: Every day | ORAL | 2 refills | Status: DC

## 2024-02-02 MED ORDER — SPIRONOLACTONE 50 MG PO TABS: 50.0000 mg | ORAL_TABLET | Freq: Every day | ORAL | 2 refills | Status: DC

## 2024-02-02 MED ORDER — FUROSEMIDE 40 MG PO TABS: 40.0000 mg | ORAL_TABLET | Freq: Every day | ORAL | 0 refills | Status: DC

## 2024-02-02 NOTE — Telephone Encounter (Signed)
Is she supposed to double the Lasix also?

## 2024-02-02 NOTE — Telephone Encounter (Signed)
Sent medication to the pharmacy. And informed patient

## 2024-02-02 NOTE — Telephone Encounter (Signed)
Please advise how patient is supposed to be taking the medication

## 2024-02-09 ENCOUNTER — Inpatient Hospital Stay: Payer: Medicaid Other | Admitting: Oncology

## 2024-02-09 ENCOUNTER — Encounter: Payer: Self-pay | Admitting: Oncology

## 2024-02-09 VITALS — BP 108/73 | HR 70 | Temp 97.7°F | Resp 18

## 2024-02-09 DIAGNOSIS — D500 Iron deficiency anemia secondary to blood loss (chronic): Secondary | ICD-10-CM | POA: Diagnosis not present

## 2024-02-09 DIAGNOSIS — E538 Deficiency of other specified B group vitamins: Secondary | ICD-10-CM

## 2024-02-09 DIAGNOSIS — D696 Thrombocytopenia, unspecified: Secondary | ICD-10-CM

## 2024-02-09 DIAGNOSIS — K7460 Unspecified cirrhosis of liver: Secondary | ICD-10-CM | POA: Diagnosis not present

## 2024-02-09 DIAGNOSIS — R161 Splenomegaly, not elsewhere classified: Secondary | ICD-10-CM | POA: Diagnosis not present

## 2024-02-09 DIAGNOSIS — K7469 Other cirrhosis of liver: Secondary | ICD-10-CM

## 2024-02-09 DIAGNOSIS — Z803 Family history of malignant neoplasm of breast: Secondary | ICD-10-CM | POA: Diagnosis not present

## 2024-02-09 DIAGNOSIS — D509 Iron deficiency anemia, unspecified: Secondary | ICD-10-CM | POA: Diagnosis not present

## 2024-02-09 DIAGNOSIS — Z800 Family history of malignant neoplasm of digestive organs: Secondary | ICD-10-CM | POA: Diagnosis not present

## 2024-02-09 DIAGNOSIS — Z801 Family history of malignant neoplasm of trachea, bronchus and lung: Secondary | ICD-10-CM | POA: Diagnosis not present

## 2024-02-09 DIAGNOSIS — D709 Neutropenia, unspecified: Secondary | ICD-10-CM | POA: Diagnosis not present

## 2024-02-09 MED ORDER — B-12 2500 MCG SL SUBL: 1.0000 | SUBLINGUAL_TABLET | Freq: Every day | SUBLINGUAL | 1 refills | Status: AC

## 2024-02-09 NOTE — Progress Notes (Signed)
 Hematology/Oncology Progress note [phone] [phone]    Chief Complaint: Brandi Bright is a 53 y.o. female follows up for management of iron deficiency anemia, thrombocytopenia, and leukopenia   ASSESSMENT & PLAN:   Thrombocytopenia (HCC) # Chronic neutropenia and thrombocytopenia Previous Bone marrow biopsy work-up was negative  Low counts are due to chronic cirrhosis/splenomegaly. Counts are stable.  Recommend Avatrombopag 60 mg once daily for 5 consecutive days, start 10 to 13 days prior to the scheduled procedure    Iron deficiency anemia Lab Results  Component Value Date   HGB 13.2 02/02/2024   TIBC 434 02/02/2024   IRONPCTSAT 15 02/02/2024   FERRITIN 32 09/08/2023  Hemoglobin has improved. monitor Hold off Venofer   B12 deficiency Recommend B12 sublingual supplementation. Rx sent to pharmacy.   Neutropenia (HCC) ANC I stable.    Other cirrhosis of liver (HCC) Follow up with GI.     Orders Placed This Encounter  Procedures   CBC with Differential (Cancer Center Only)    Standing Status:   Future    Expected Date:   08/08/2024    Expiration Date:   02/08/2025   Iron and TIBC    Standing Status:   Future    Expected Date:   08/08/2024    Expiration Date:   02/08/2025   Ferritin    Standing Status:   Future    Expected Date:   08/08/2024    Expiration Date:   02/08/2025   Vitamin B12    Standing Status:   Future    Expected Date:   08/08/2024    Expiration Date:   02/08/2025   Follow up 6 months.  All questions were answered. The patient knows to call the clinic with any problems, questions or concerns.  Rickard Patience, MD, PhD [facility] Hematology Oncology 02/09/2024    PERTINENT HEMATOLOGY HISTORY Patient follows up with Dr. Merlene Pulling previously.  Establish care with me on 02/09/2019. Reviewed patient's previous medical records, labs, imaging results. # History of cirrhosis and hepatitis C.  She has a history of chronic  anemia, thrombocytopenia and leukopenia dating back to 03/2012.  Platelet count has fluctuated between 54,000 - 56,000 since 12/28/2017 (previously 73,000 - 134,000).  Ferritin was 14 on 08/31/2018.    Work-up on 09/23/2018 revealed a hematocrit of 31.0, hemoglobin 10.1, MCV 87.6, platelets 46,000, WBC 1900 with an ANC of 1100.  Ferritin was 10 (low) with iron saturation 16% with a TIBC of 401.  Retic was 1.2%.  B12 was 315.  Normal studies included: folate, SPEP, and free light chain ratio.  Copper was 69 (72-166).  ANA was + with double stranded DNA antibody 13 (0-9).  TSH was 0.036 (0.35-4.5) with a free T4 1.05 (0.82-1.77).  Peripheral smear revealed variant lymphocytes.  Ferritin has been followed:  14 on 08/31/2018 and 10 on 09/23/2018.  Abdomen and pelvic CT on 07/14/2018 revealed cirrhosis with portal hypertension noted by prominent splenomegaly (18.5 x 13.8 x 8.1 cm; volume 1100 cm3), enlarged portal veins with recannulated umbilical vein, paraesophageal and perigastric varices.  There was mild thickening of the cecum and ascending colon.  EGD on 10/27/2018 revealed grade II esophageal varices.  There was erythematous mucosa in the antrum.  There was congested, erythematous, friable (with contact bleeding), granular and nodular mucosa in the gastric fundus and astric body.  There was a single gastric polyp (polypoid ulcerated antral type mucos with chronic active mucosal inflammation).  There was a normal duodenal bulb, second  portion of the duodenum and examined duodenum.  The patient's iron deficiency could be explained by her friable gastric mucosa (likely from portal hypertension).  There was no history of variceal bleeding and thus this is not a cause of her iron deficiency.  # admitted from 10/11/2019 to 10/12/2019 due to rectal bleeding EGD 10/12/2019 showed portal hypertensive gastropathy, treated with APC.  Nonbleeding large esophageal varices incompletely evaluated.  Banded. Patient  follows with gastroenterology and had EGD on 11/20/2019. Banding was not done due to large amount of food in the stomach. There was plan for EGD on 11/23/2019.  Her gastroenterologist Dr. Maximino Greenland contacted me to see if patient can get platelet transfusion to improve her platelet counts for banding on 11/23/2019.     INTERVAL HISTORY Brandi Bright is a 53 y.o. female who has above history reviewed by me today presents for follow up visit for management of thrombocytopenia, splenomegaly, leukopenia and anemia. She feels well today. Denies weight loss, fever, chills, fatigue, night sweats.  + easy bruising She follows up with GI and had recent EGD. She denies blood in stool.   Review of Systems  Constitutional:  Positive for fatigue. Negative for appetite change, chills and fever.  HENT:   Negative for hearing loss and voice change.   Eyes:  Negative for eye problems.  Respiratory:  Negative for chest tightness and cough.   Cardiovascular:  Negative for chest pain.  Gastrointestinal:  Negative for abdominal distention, abdominal pain and blood in stool.  Endocrine: Negative for hot flashes.  Genitourinary:  Negative for difficulty urinating and frequency.   Musculoskeletal:  Negative for arthralgias.  Skin:  Negative for itching and rash.  Neurological:  Negative for extremity weakness.  Hematological:  Negative for adenopathy. Bruises/bleeds easily.  Psychiatric/Behavioral:  Negative for confusion.    Past Medical History:  Diagnosis Date   Arthritis    B12 deficiency 07/20/2023   Back pain    Chronic knee pain    Cirrhosis (HCC)    Dysphagia    Dyspnea    GI bleed 06/24/2019   Gout    Heart attack (HCC)    History of blood transfusion    HLD (hyperlipidemia)    Hypothyroidism    IDA (iron deficiency anemia) 01/16/2020   Iron deficiency anemia due to chronic blood loss 01/16/2020   Leukopenia 09/23/2018   MI, old    Oropharyngeal dysphagia 04/07/2021   Pleurisy  01/13/2022   Sinusitis 04/07/2021   Stomach irritation    Thyroid disease     Past Surgical History:  Procedure Laterality Date   CARDIAC CATHETERIZATION     CESAREAN SECTION     COLONOSCOPY WITH PROPOFOL N/A 03/16/2018   Procedure: COLONOSCOPY WITH PROPOFOL;  Surgeon: Pasty Spillers, MD;  Location: [facility] ENDOSCOPY;  Service: Endoscopy;  Laterality: N/A;   COLONOSCOPY WITH PROPOFOL N/A 07/09/2021   Procedure: COLONOSCOPY WITH PROPOFOL;  Surgeon: Pasty Spillers, MD;  Location: [facility] ENDOSCOPY;  Service: Endoscopy;  Laterality: N/A;   ESOPHAGOGASTRODUODENOSCOPY N/A 10/11/2019   Procedure: ESOPHAGOGASTRODUODENOSCOPY (EGD);  Surgeon: Toney Reil, MD;  Location: [facility] ENDOSCOPY;  Service: Gastroenterology;  Laterality: N/A;   ESOPHAGOGASTRODUODENOSCOPY (EGD) WITH PROPOFOL N/A 10/27/2018   Procedure: ESOPHAGOGASTRODUODENOSCOPY (EGD) WITH PROPOFOL;  Surgeon: Pasty Spillers, MD;  Location: [facility] ENDOSCOPY;  Service: Endoscopy;  Laterality: N/A;   ESOPHAGOGASTRODUODENOSCOPY (EGD) WITH PROPOFOL N/A 06/25/2019   Procedure: ESOPHAGOGASTRODUODENOSCOPY (EGD) WITH PROPOFOL;  Surgeon: Wyline Mood, MD;  Location: [facility] ENDOSCOPY;  Service: Gastroenterology;  Laterality: N/A;  ESOPHAGOGASTRODUODENOSCOPY (EGD) WITH PROPOFOL N/A 09/12/2019   Procedure: ESOPHAGOGASTRODUODENOSCOPY (EGD) WITH PROPOFOL;  Surgeon: Midge Minium, MD;  Location: [facility] ENDOSCOPY;  Service: Endoscopy;  Laterality: N/A;   ESOPHAGOGASTRODUODENOSCOPY (EGD) WITH PROPOFOL N/A 11/20/2019   Procedure: ESOPHAGOGASTRODUODENOSCOPY (EGD) WITH PROPOFOL;  Surgeon: Pasty Spillers, MD;  Location: [facility] ENDOSCOPY;  Service: Endoscopy;  Laterality: N/A;   ESOPHAGOGASTRODUODENOSCOPY (EGD) WITH PROPOFOL N/A 11/23/2019   Procedure: ESOPHAGOGASTRODUODENOSCOPY (EGD) WITH PROPOFOL;  Surgeon: Pasty Spillers, MD;  Location: [facility] ENDOSCOPY;  Service: Endoscopy;  Laterality: N/A;   ESOPHAGOGASTRODUODENOSCOPY (EGD) WITH PROPOFOL N/A  01/03/2020   Procedure: ESOPHAGOGASTRODUODENOSCOPY (EGD) WITH PROPOFOL;  Surgeon: Toney Reil, MD;  Location: [facility] ENDOSCOPY;  Service: Endoscopy;  Laterality: N/A;   ESOPHAGOGASTRODUODENOSCOPY (EGD) WITH PROPOFOL N/A 07/09/2021   Procedure: ESOPHAGOGASTRODUODENOSCOPY (EGD) WITH PROPOFOL;  Surgeon: Pasty Spillers, MD;  Location: [facility] ENDOSCOPY;  Service: Endoscopy;  Laterality: N/A;   ESOPHAGOGASTRODUODENOSCOPY (EGD) WITH PROPOFOL N/A 05/17/2023   Procedure: ESOPHAGOGASTRODUODENOSCOPY (EGD) WITH PROPOFOL;  Surgeon: Toney Reil, MD;  Location: [facility] ENDOSCOPY;  Service: Gastroenterology;  Laterality: N/A;   ESOPHAGOGASTRODUODENOSCOPY (EGD) WITH PROPOFOL N/A 09/27/2023   Procedure: ESOPHAGOGASTRODUODENOSCOPY (EGD) WITH PROPOFOL;  Surgeon: Toney Reil, MD;  Location: [facility] ENDOSCOPY;  Service: Gastroenterology;  Laterality: N/A;   ESOPHAGOGASTRODUODENOSCOPY (EGD) WITH PROPOFOL N/A 12/02/2023   Procedure: ESOPHAGOGASTRODUODENOSCOPY (EGD) WITH PROPOFOL;  Surgeon: Toney Reil, MD;  Location: [facility] ENDOSCOPY;  Service: Gastroenterology;  Laterality: N/A;   ESOPHAGOGASTRODUODENOSCOPY (EGD) WITH PROPOFOL N/A 12/30/2023   Procedure: ESOPHAGOGASTRODUODENOSCOPY (EGD) WITH PROPOFOL;  Surgeon: Toney Reil, MD;  Location: [facility] ENDOSCOPY;  Service: Gastroenterology;  Laterality: N/A;   HEMOSTASIS CLIP PLACEMENT  09/27/2023   Procedure: HEMOSTASIS CLIP PLACEMENT;  Surgeon: Toney Reil, MD;  Location: [facility] ENDOSCOPY;  Service: Gastroenterology;;   POLYPECTOMY  09/27/2023   Procedure: POLYPECTOMY;  Surgeon: Toney Reil, MD;  Location: [facility] ENDOSCOPY;  Service: Gastroenterology;;   TUBAL LIGATION      Family History  Problem Relation Age of Onset   Stroke Mother    Hypertension Mother    Hyperlipidemia Mother    Diabetes Mother    COPD Mother    Cancer Mother    Arthritis Mother    Hypertension Father    COPD Father    Cancer Father    CAD Father     Colon cancer Father    Lung cancer Father    Heart attack Father    Stroke Sister    Kidney disease Sister    Hypertension Sister    Hyperlipidemia Sister    Diabetes Sister    COPD Sister    Arthritis Sister    Hyperlipidemia Sister    Arthritis Sister    Stroke Sister    Hypertension Sister    Hyperlipidemia Sister    Heart disease Sister    Heart attack Sister    Diabetes Sister    COPD Sister    Asthma Sister    Arthritis Sister    Breast cancer Maternal Aunt 64 - 59   Arthritis Maternal Grandmother    Arthritis Maternal Grandfather    Arthritis Paternal Grandmother    Arthritis Paternal Grandfather    Breast cancer Cousin 23 - 49   Stroke Brother    Hyperlipidemia Brother    COPD Brother    Arthritis Brother    Pancreatic cancer Brother    Hypertension Other    Social History   Socioeconomic History   Marital status: Single    Spouse name: Not  on file   Number of children: Not on file   Years of education: Not on file   Highest education level: Not on file  Occupational History   Not on file  Tobacco Use   Smoking status: Never   Smokeless tobacco: Never  Vaping Use   Vaping status: Never Used  Substance and Sexual Activity   Alcohol use: Yes    Comment: occ   Drug use: Yes    Comment: prescribed oxycodone and oxycotin   Sexual activity: Not Currently  Other Topics Concern   Not on file  Social History Narrative   Not on file   Social Drivers of Health   Financial Resource Strain: Medium Risk (10/11/2019)   Overall Financial Resource Strain (CARDIA)    Difficulty of Paying Living Expenses: Somewhat hard  Food Insecurity: No Food Insecurity (04/01/2023)   Hunger Vital Sign    Worried About Running Out of Food in the Last Year: Never true    Ran Out of Food in the Last Year: Never true  Transportation Needs: No Transportation Needs (04/01/2023)   PRAPARE - Administrator, Civil Service (Medical): No    Lack of Transportation  (Non-Medical): No  Physical Activity: Inactive (10/11/2019)   Exercise Vital Sign    Days of Exercise per Week: 0 days    Minutes of Exercise per Session: 0 min  Stress: Stress Concern Present (10/11/2019)   [employer] of Occupational Health - Occupational Stress Questionnaire    Feeling of Stress : Rather much  Social Connections: Unknown (10/11/2019)   Social Connection and Isolation Panel [NHANES]    Frequency of Communication with Friends and Family: More than three times a week    Frequency of Social Gatherings with Friends and Family: More than three times a week    Attends Religious Services: Never    Database administrator or Organizations: No    Attends Banker Meetings: Not asked    Marital Status: Not on file  Intimate Partner Violence: Not At Risk (04/01/2023)   Humiliation, Afraid, Rape, and Kick questionnaire    Fear of Current or Ex-Partner: No    Emotionally Abused: No    Physically Abused: No    Sexually Abused: No    Allergies:  Allergies  Allergen Reactions   Vicodin [Hydrocodone-Acetaminophen] Rash   Amoxicillin Rash and Other (See Comments)    Has patient had a PCN reaction causing immediate rash, facial/tongue/throat swelling, SOB or lightheadedness with hypotension: Yes Has patient had a PCN reaction causing severe rash involving mucus membranes or skin necrosis: No Has patient had a PCN reaction that required hospitalization: No Has patient had a PCN reaction occurring within the last 10 years: No If all of the above answers are "NO", then may proceed with Cephalosporin use.    Gabapentin Rash    Current Medications: Current Outpatient Medications  Medication Sig Dispense Refill   avatrombopag maleate (DOPTELET) 20 MG tablet Take 3 tablets (60 mg total) by mouth daily. Take for 5 days. Take with food. Beginning 10 to 13 days prior to scheduled procedure. 15 tablet 0   furosemide (LASIX) 20 MG tablet TAKE 1 TABLET BY MOUTH EVERY DAY  30 tablet 2   furosemide (LASIX) 40 MG tablet Take 1 tablet (40 mg total) by mouth daily. 30 tablet 0   levothyroxine (SYNTHROID) 100 MCG tablet Take 1 tablet (100 mcg total) by mouth daily. 90 tablet 0   NEOMYCIN-POLYMYXIN-HYDROCORTISONE (CORTISPORIN) 1 % SOLN  OTIC solution 4 gtt in affected ear(s) tid, max of 10 days. Lie with affected ear upward x 5 minutes 10 mL 0   nitroGLYCERIN (NITROLINGUAL) 0.4 MG/SPRAY spray Place 1 spray under the tongue every 5 (five) minutes x 3 doses as needed for chest pain. 12 g 2   omeprazole (PRILOSEC) 40 MG capsule TAKE 1 CAPSULE (40 MG TOTAL) BY MOUTH DAILY. 90 capsule 1   oxyCODONE (OXYCONTIN) 15 mg 12 hr tablet 15 mg every 12 (twelve) hours.     Oxycodone HCl 10 MG TABS Take 10 mg by mouth 4 (four) times daily as needed.     Semaglutide-Weight Management (WEGOVY) 0.25 MG/0.5ML SOAJ Inject 0.25 mg into the skin once a week. 2 mL 1   Semaglutide-Weight Management (WEGOVY) 0.5 MG/0.5ML SOAJ Inject 0.5 mg into the skin once a week. 2 mL 0   Semaglutide-Weight Management (WEGOVY) 1 MG/0.5ML SOAJ Inject 1 mg into the skin once a week. 2 mL 0   spironolactone (ALDACTONE) 25 MG tablet Take 1 tablet (25 mg total) by mouth daily. 30 tablet 2   spironolactone (ALDACTONE) 50 MG tablet Take 1 tablet (50 mg total) by mouth daily. 30 tablet 2   ZTLIDO 1.8 % PTCH Apply 1 patch topically at bedtime.     Cyanocobalamin (B-12) 2500 MCG SUBL Place 1 tablet under the tongue daily. 90 tablet 1   No current facility-administered medications for this visit.     Physical Exam: Blood pressure 108/73, pulse 70, temperature 97.7 F (36.5 C), resp. rate 18, SpO2 97%.   Physical Exam Constitutional:      General: She is not in acute distress.    Appearance: She is not diaphoretic.  HENT:     Head: Normocephalic and atraumatic.  Eyes:     General: No scleral icterus. Cardiovascular:     Rate and Rhythm: Normal rate and regular rhythm.     Heart sounds: No murmur  heard. Pulmonary:     Effort: Pulmonary effort is normal. No respiratory distress.     Breath sounds: Normal breath sounds.  Abdominal:     General: Bowel sounds are normal.     Palpations: Abdomen is soft.     Comments: Splenomegaly  Musculoskeletal:        General: Normal range of motion.     Cervical back: Normal range of motion and neck supple.  Skin:    General: Skin is warm and dry.     Findings: No erythema.  Neurological:     Mental Status: She is alert and oriented to person, place, and time. Mental status is at baseline.     Cranial Nerves: No cranial nerve deficit.     Motor: No abnormal muscle tone.  Psychiatric:        Mood and Affect: Affect normal.    Labs    Latest Ref Rng & Units 02/02/2024    9:38 AM 12/28/2023    3:39 PM 12/02/2023    2:24 PM  CBC  WBC 4.0 - 10.5 K/uL 2.1  2.6  2.4   Hemoglobin 12.0 - 15.0 g/dL 04.5  40.9  81.1   Hematocrit 36.0 - 46.0 % 37.8  39.0  37.9   Platelets 150 - 400 K/uL 43  116  39       Latest Ref Rng & Units 02/02/2024    9:38 AM 11/24/2023    2:17 PM 08/11/2023    4:07 PM  CMP  Glucose 70 - 99 mg/dL  95  86   BUN 6 - 24 mg/dL  11  9   Creatinine 0.98 - 1.00 mg/dL  1.19  1.47   Sodium 829 - 144 mmol/L  142  142   Potassium 3.5 - 5.2 mmol/L  3.7  4.3   Chloride 96 - 106 mmol/L  105  110   CO2 20 - 29 mmol/L  24  26   Calcium 8.7 - 10.2 mg/dL  8.8  9.3   Total Protein 6.5 - 8.1 g/dL 6.1   6.5   Total Bilirubin 0.0 - 1.2 mg/dL 1.2   1.0   Alkaline Phos 38 - 126 U/L 78   95   AST 15 - 41 U/L 37   29   ALT 0 - 44 U/L 27   17    Lab Results  Component Value Date   IRON 64 02/02/2024   TIBC 434 02/02/2024   FERRITIN 32 09/08/2023     RADIOGRAPHIC STUDIES: I have personally reviewed the radiological images as listed and agreed with the findings in the report. DG HIPS BILAT WITH PELVIS MIN 5 VIEWS Result Date: 12/25/2023 CLINICAL DATA:  Pain. EXAM: DG HIP (WITH OR WITHOUT PELVIS) 5V BILAT COMPARISON:  None Available.  FINDINGS: There is no evidence of hip fracture or dislocation. There is no evidence of arthropathy or other focal bone abnormality. IMPRESSION: Negative. Electronically Signed   By: Layla Maw M.D.   On: 12/25/2023 20:17   MM 3D SCREENING MAMMOGRAM BILATERAL BREAST Result Date: 11/30/2023 CLINICAL DATA:  Screening. EXAM: DIGITAL SCREENING BILATERAL MAMMOGRAM WITH TOMOSYNTHESIS AND CAD TECHNIQUE: Bilateral screening digital craniocaudal and mediolateral oblique mammograms were obtained. Bilateral screening digital breast tomosynthesis was performed. The images were evaluated with computer-aided detection. COMPARISON:  Previous exam(s). ACR Breast Density Category a: The breasts are almost entirely fatty. FINDINGS: There are no findings suspicious for malignancy. IMPRESSION: No mammographic evidence of malignancy. A result letter of this screening mammogram will be mailed directly to the patient. RECOMMENDATION: Screening mammogram in one year. (Code:SM-B-01Y) BI-RADS CATEGORY  1: Negative. Electronically Signed   By: Elberta Fortis M.D.   On: 11/30/2023 10:32   US ABDOMEN LIMITED RUQ (LIVER/GB) Result Date: 11/19/2023 CLINICAL DATA:  Cirrhosis of the liver EXAM: ULTRASOUND ABDOMEN LIMITED RIGHT UPPER QUADRANT COMPARISON:  Ultrasound 05/14/2023 FINDINGS: Gallbladder: No gallstones or wall thickening visualized. No sonographic Murphy sign noted by sonographer. Common bile duct: Diameter: 5.3 mm Liver: Cirrhosis. No focal lesion. Portal vein is patent on color Doppler imaging with normal direction of blood flow towards the liver. Other: Recanalized umbilical vein. IMPRESSION: 1. Cirrhosis. No focal lesion. 2. No cholelithiasis or sonographic evidence for acute cholecystitis. Electronically Signed   By: Annia Belt M.D.   On: 11/19/2023 10:34   MM Outside Films Mammo Result Date: 11/15/2023 This examination belongs to an outside facility and is stored here for comparison purposes only.  Contact the  originating outside institution for any associated report or interpretation.

## 2024-02-09 NOTE — Assessment & Plan Note (Addendum)
 Recommend B12 sublingual supplementation. Rx sent to pharmacy.

## 2024-02-09 NOTE — Assessment & Plan Note (Signed)
 ANC I stable.

## 2024-02-09 NOTE — Assessment & Plan Note (Signed)
 Follow-up with GI

## 2024-02-09 NOTE — Assessment & Plan Note (Addendum)
 Lab Results  Component Value Date   HGB 13.2 02/02/2024   TIBC 434 02/02/2024   IRONPCTSAT 15 02/02/2024   FERRITIN 32 09/08/2023  Hemoglobin has improved. monitor Hold off Venofer

## 2024-02-09 NOTE — Assessment & Plan Note (Addendum)
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 Counts are stable.  Recommend Avatrombopag 60 mg once daily for 5 consecutive days, start 10 to 13 days prior to the scheduled procedure

## 2024-02-24 ENCOUNTER — Other Ambulatory Visit: Payer: Self-pay | Admitting: Gastroenterology

## 2024-03-03 ENCOUNTER — Other Ambulatory Visit: Payer: Self-pay | Admitting: Nurse Practitioner

## 2024-03-03 ENCOUNTER — Telehealth: Payer: Self-pay | Admitting: Nurse Practitioner

## 2024-03-03 DIAGNOSIS — E039 Hypothyroidism, unspecified: Secondary | ICD-10-CM

## 2024-03-03 NOTE — Telephone Encounter (Signed)
 Patient need lab orders.

## 2024-03-10 ENCOUNTER — Other Ambulatory Visit (INDEPENDENT_AMBULATORY_CARE_PROVIDER_SITE_OTHER): Payer: Medicaid Other

## 2024-03-10 DIAGNOSIS — E039 Hypothyroidism, unspecified: Secondary | ICD-10-CM

## 2024-03-11 LAB — T4, FREE: Free T4: 1.6 ng/dL (ref 0.8–1.8)

## 2024-03-11 LAB — TSH: TSH: 0.01 m[IU]/L — ABNORMAL LOW

## 2024-03-13 ENCOUNTER — Other Ambulatory Visit: Payer: Self-pay | Admitting: Nurse Practitioner

## 2024-03-13 ENCOUNTER — Other Ambulatory Visit: Payer: Self-pay

## 2024-03-13 DIAGNOSIS — R899 Unspecified abnormal finding in specimens from other organs, systems and tissues: Secondary | ICD-10-CM

## 2024-03-13 DIAGNOSIS — E039 Hypothyroidism, unspecified: Secondary | ICD-10-CM

## 2024-03-13 MED ORDER — LEVOTHYROXINE SODIUM 88 MCG PO TABS
88.0000 ug | ORAL_TABLET | Freq: Every day | ORAL | 1 refills | Status: DC
Start: 2024-03-13 — End: 2024-04-26

## 2024-03-21 ENCOUNTER — Other Ambulatory Visit: Payer: Self-pay | Admitting: Nurse Practitioner

## 2024-03-22 DIAGNOSIS — M542 Cervicalgia: Secondary | ICD-10-CM | POA: Diagnosis not present

## 2024-03-22 DIAGNOSIS — G894 Chronic pain syndrome: Secondary | ICD-10-CM | POA: Diagnosis not present

## 2024-03-22 DIAGNOSIS — M5451 Vertebrogenic low back pain: Secondary | ICD-10-CM | POA: Diagnosis not present

## 2024-03-22 DIAGNOSIS — M25569 Pain in unspecified knee: Secondary | ICD-10-CM | POA: Diagnosis not present

## 2024-03-22 MED ORDER — WEGOVY 1 MG/0.5ML ~~LOC~~ SOAJ: 1.0000 mg | SUBCUTANEOUS | 0 refills | Status: DC

## 2024-04-25 ENCOUNTER — Ambulatory Visit: Payer: Medicaid Other | Admitting: Nurse Practitioner

## 2024-04-25 ENCOUNTER — Other Ambulatory Visit

## 2024-04-25 ENCOUNTER — Encounter: Payer: Self-pay | Admitting: Nurse Practitioner

## 2024-04-25 VITALS — BP 110/64 | HR 60 | Temp 98.5°F | Ht <= 58 in | Wt 195.0 lb

## 2024-04-25 DIAGNOSIS — E039 Hypothyroidism, unspecified: Secondary | ICD-10-CM

## 2024-04-25 DIAGNOSIS — Z6841 Body Mass Index (BMI) 40.0 and over, adult: Secondary | ICD-10-CM | POA: Diagnosis not present

## 2024-04-25 DIAGNOSIS — L821 Other seborrheic keratosis: Secondary | ICD-10-CM | POA: Diagnosis not present

## 2024-04-25 LAB — TSH: TSH: 0.05 u[IU]/mL — ABNORMAL LOW (ref 0.35–5.50)

## 2024-04-25 MED ORDER — WEGOVY 1.7 MG/0.75ML ~~LOC~~ SOAJ: 1.7000 mg | SUBCUTANEOUS | 0 refills | Status: DC

## 2024-04-25 NOTE — Assessment & Plan Note (Signed)
 She has lost approximately 14 pounds since February through lifestyle modifications and pharmacotherapy. Currently on Wegovy  1 mg without adverse effects, she requests an increase to 1.7 mg to enhance weight loss. Increase Wegovy  to 1.7 mg weekly. Continue regular exercise and maintain a low carbohydrate, high protein diet. Monitor for side effects, including headaches, nausea, diarrhea, constipation, and acid reflux.

## 2024-04-25 NOTE — Assessment & Plan Note (Signed)
 Hypothyroidism is managed with levothyroxine  at 88 micrograms. TSH levels are improving with dose adjustments, with the last TSH at 0.01. The goal is a TSH level between 1 and 3. Recheck thyroid  function tests today and continue the current levothyroxine  dose. Adjust the dose based on test results.

## 2024-04-25 NOTE — Assessment & Plan Note (Signed)
 Consistent with SK. Encourage to monitor for any changes in shape, size or color and notify. Advised sunscreen when in the sun.

## 2024-04-25 NOTE — Progress Notes (Signed)
 Bluford Burkitt, NP-C Phone: [phone]  Brandi Bright is a 53 y.o. female who presents today for follow up.   Discussed the use of AI scribe software for clinical note transcription with the patient, who gave verbal consent to proceed.  History of Present Illness   Brandi Bright is a 53 year old female with thyroid  issues who presents for a three-month follow-up regarding thyroid  management and weight concerns.  She is here for a three-month follow-up regarding her thyroid  management. She has had several adjustments to her Levothyroxine  and is currently taking 88 micrograms daily. She feels no different from higher doses and mentions no issues with hair, skin, or nails. Her last thyroid  check was on March 28, and she is due for another today. No heart palpitations or temperature regulation issues.  Regarding her weight, she has lost approximately 14 pounds since February, though she feels it is not coming off fast enough. She exercises regularly, going to the gym four times a week, where she does 30 minutes on the treadmill and various weightlifting exercises. Her diet includes one meat and something green, with a preference for potatoes, and she eats salad once or twice a week. For breakfast, she has a boiled egg and a pickle. She has significantly reduced her intake of sweets and has not consumed soda since 2021, switching to water and occasionally tea.  She is currently on Wegovy  1 mg, which she has been taking without side effects such as headaches or nausea. She mentions a history of acid reflux and occasional diarrhea, but these symptoms predate her current medication regimen. 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  She mentions some skin concerns, specifically flat mole-like spots on her legs, which are not as noticeable today. They are light brown in color and rough or scaly in texture. Her  mother had similar skin spots.      Social History   Tobacco Use  Smoking Status Never  Smokeless Tobacco Never    Current Outpatient Medications on File Prior to Visit  Medication Sig Dispense Refill   avatrombopag maleate  (DOPTELET ) 20 MG tablet Take 3 tablets (60 mg total) by mouth daily. Take for 5 days. Take with food. Beginning 10 to 13 days prior to scheduled procedure. 15 tablet 0   Cyanocobalamin  (B-12) 2500 MCG SUBL Place 1 tablet under the tongue daily. 90 tablet 1   furosemide  (LASIX ) 20 MG tablet TAKE 1 TABLET BY MOUTH EVERY DAY 30 tablet 2   furosemide  (LASIX ) 40 MG tablet TAKE 1 TABLET BY MOUTH EVERY DAY 90 tablet 1   levothyroxine  (SYNTHROID ) 88 MCG tablet Take 1 tablet (88 mcg total) by mouth daily before breakfast. 90 tablet 1   NEOMYCIN -POLYMYXIN-HYDROCORTISONE  (CORTISPORIN) 1 % SOLN OTIC solution 4 gtt in affected ear(s) tid, max of 10 days. Lie with affected ear upward x 5 minutes 10 mL 0   nitroGLYCERIN  (NITROLINGUAL ) 0.4 MG/SPRAY spray Place 1 spray under the tongue every 5 (five) minutes x 3 doses as needed for chest pain. 12 g 2   omeprazole  (PRILOSEC) 40 MG capsule TAKE 1 CAPSULE (40 MG TOTAL) BY MOUTH DAILY. 90 capsule 1   oxyCODONE  (OXYCONTIN ) 15 mg 12 hr tablet 15 mg every 12 (twelve) hours.     Oxycodone  HCl 10 MG TABS Take 10 mg by mouth 4 (four) times daily as needed.     spironolactone  (ALDACTONE ) 25 MG tablet  Take 1 tablet (25 mg total) by mouth daily. 30 tablet 2   spironolactone  (ALDACTONE ) 50 MG tablet Take 1 tablet (50 mg total) by mouth daily. 30 tablet 2   ZTLIDO  1.8 % PTCH Apply 1 patch topically at bedtime.     No current facility-administered medications on file prior to visit.     ROS see history of present illness  Objective  Physical Exam Vitals:   04/25/24 0940  BP: 110/64  Pulse: 60  Temp: 98.5 F (36.9 C)  SpO2: 94%    BP Readings from Last 3 Encounters:  04/25/24 110/64  02/09/24 108/73  01/26/24 100/60   Wt Readings  from Last 3 Encounters:  04/25/24 195 lb (88.5 kg)  01/26/24 209 lb 6.4 oz (95 kg)  12/30/23 207 lb (93.9 kg)    Physical Exam Constitutional:      General: She is not in acute distress.    Appearance: Normal appearance. She is obese.  HENT:     Head: Normocephalic.  Cardiovascular:     Rate and Rhythm: Normal rate and regular rhythm.     Heart sounds: Normal heart sounds.  Pulmonary:     Effort: Pulmonary effort is normal.     Breath sounds: Normal breath sounds.  Skin:    General: Skin is warm and dry.     Findings: Lesion (left lower leg- small, light brown, rough patch of skin) present.  Neurological:     General: No focal deficit present.     Mental Status: She is alert.  Psychiatric:        Mood and Affect: Mood normal.        Behavior: Behavior normal.      Assessment/Plan: Please see individual problem list.  Acquired hypothyroidism Assessment & Plan: Hypothyroidism is managed with levothyroxine  at 88 micrograms. TSH levels are improving with dose adjustments, with the last TSH at 0.01. The goal is a TSH level between 1 and 3. Recheck thyroid  function tests today and continue the current levothyroxine  dose. Adjust the dose based on test results.   Orders: -     TSH  Morbid obesity with BMI of 40.0-44.9, adult [facility]) Assessment & Plan: She has lost approximately 14 pounds since February through lifestyle modifications and pharmacotherapy. Currently on Wegovy  1 mg without adverse effects, she requests an increase to 1.7 mg to enhance weight loss. Increase Wegovy  to 1.7 mg weekly. Continue regular exercise and maintain a low carbohydrate, high protein diet. Monitor for side effects, including headaches, nausea, diarrhea, constipation, and acid reflux.   Orders: -     Wegovy ; Inject 1.7 mg into the skin once a week.  Dispense: 9 mL; Refill: 0  Seborrheic keratosis Assessment & Plan: Consistent with SK. Encourage to monitor for any changes in shape, size or color  and notify. Advised sunscreen when in the sun.       Return in about 3 months (around 07/26/2024) for Follow up.   Bluford Burkitt, NP-C [facility]

## 2024-04-26 ENCOUNTER — Other Ambulatory Visit: Payer: Self-pay | Admitting: Nurse Practitioner

## 2024-04-26 ENCOUNTER — Ambulatory Visit: Payer: Self-pay | Admitting: Nurse Practitioner

## 2024-04-26 DIAGNOSIS — E0580 Other thyrotoxicosis without thyrotoxic crisis or storm: Secondary | ICD-10-CM

## 2024-04-26 DIAGNOSIS — E039 Hypothyroidism, unspecified: Secondary | ICD-10-CM

## 2024-04-26 MED ORDER — LEVOTHYROXINE SODIUM 75 MCG PO TABS: 75.0000 ug | ORAL_TABLET | Freq: Every day | ORAL | 0 refills | Status: DC

## 2024-04-29 ENCOUNTER — Other Ambulatory Visit: Payer: Self-pay | Admitting: Gastroenterology

## 2024-05-01 ENCOUNTER — Ambulatory Visit
Admission: RE | Admit: 2024-05-01 | Discharge: 2024-05-01 | Disposition: A | Discharge status: Home / Self Care | Source: Ambulatory Visit | Attending: Nurse Practitioner | Admitting: Nurse Practitioner

## 2024-05-01 DIAGNOSIS — E0590 Thyrotoxicosis, unspecified without thyrotoxic crisis or storm: Secondary | ICD-10-CM | POA: Diagnosis not present

## 2024-05-01 DIAGNOSIS — E0580 Other thyrotoxicosis without thyrotoxic crisis or storm: Secondary | ICD-10-CM | POA: Insufficient documentation

## 2024-05-03 ENCOUNTER — Ambulatory Visit: Payer: Self-pay | Admitting: Nurse Practitioner

## 2024-05-07 ENCOUNTER — Other Ambulatory Visit: Payer: Self-pay | Admitting: Gastroenterology

## 2024-05-18 DIAGNOSIS — M542 Cervicalgia: Secondary | ICD-10-CM | POA: Diagnosis not present

## 2024-05-18 DIAGNOSIS — G894 Chronic pain syndrome: Secondary | ICD-10-CM | POA: Diagnosis not present

## 2024-05-18 DIAGNOSIS — M25569 Pain in unspecified knee: Secondary | ICD-10-CM | POA: Diagnosis not present

## 2024-05-18 DIAGNOSIS — M5451 Vertebrogenic low back pain: Secondary | ICD-10-CM | POA: Diagnosis not present

## 2024-05-18 DIAGNOSIS — Z79891 Long term (current) use of opiate analgesic: Secondary | ICD-10-CM | POA: Diagnosis not present

## 2024-06-02 ENCOUNTER — Other Ambulatory Visit: Payer: Self-pay | Admitting: Gastroenterology

## 2024-07-18 ENCOUNTER — Other Ambulatory Visit: Payer: Self-pay | Admitting: Nurse Practitioner

## 2024-07-19 ENCOUNTER — Telehealth: Payer: Self-pay

## 2024-07-19 NOTE — Telephone Encounter (Signed)
 Per pt PA needed for wegovy  2.4 mg pt hs titrated up to the 2.4

## 2024-07-20 ENCOUNTER — Telehealth: Payer: Self-pay

## 2024-07-20 ENCOUNTER — Other Ambulatory Visit (HOSPITAL_COMMUNITY): Payer: Self-pay

## 2024-07-20 NOTE — Telephone Encounter (Addendum)
 Pharmacy Patient Advocate Encounter   Received notification from Pt Calls Messages that prior authorization for Wegovy  2.4MG /0.75ML auto-injectors is required/requested.   Insurance verification completed.   The patient is insured through [facility] .   Per test claim: PA required; PA started via CoverMyMeds. KEY [id number] . Please see clinical question(s) below that I am not finding the answer to in their chart and advise.  Has the patient lost a total of 5% of pretreatment weight, and is the patient maintaining the 5% weight loss with documentation of baseline and current weight (within the past 45 days of this prior authorization request) provided? Please ensure you include pounds (lbs) or kilograms (kg) for the weight value AND the dates of these measurements. NOTE: Physical documentation must be provided.*

## 2024-07-24 ENCOUNTER — Other Ambulatory Visit: Payer: Self-pay | Admitting: Nurse Practitioner

## 2024-07-24 DIAGNOSIS — E039 Hypothyroidism, unspecified: Secondary | ICD-10-CM

## 2024-07-27 ENCOUNTER — Ambulatory Visit: Admitting: Nurse Practitioner

## 2024-07-27 ENCOUNTER — Other Ambulatory Visit (HOSPITAL_COMMUNITY): Payer: Self-pay

## 2024-07-27 ENCOUNTER — Encounter: Payer: Self-pay | Admitting: Nurse Practitioner

## 2024-07-27 VITALS — BP 112/68 | HR 72 | Resp 16 | Ht <= 58 in | Wt 182.8 lb

## 2024-07-27 DIAGNOSIS — E66812 Obesity, class 2: Secondary | ICD-10-CM | POA: Diagnosis not present

## 2024-07-27 DIAGNOSIS — H6993 Unspecified Eustachian tube disorder, bilateral: Secondary | ICD-10-CM | POA: Insufficient documentation

## 2024-07-27 DIAGNOSIS — M542 Cervicalgia: Secondary | ICD-10-CM | POA: Diagnosis not present

## 2024-07-27 DIAGNOSIS — G894 Chronic pain syndrome: Secondary | ICD-10-CM | POA: Diagnosis not present

## 2024-07-27 DIAGNOSIS — E039 Hypothyroidism, unspecified: Secondary | ICD-10-CM | POA: Diagnosis not present

## 2024-07-27 DIAGNOSIS — Z6839 Body mass index (BMI) 39.0-39.9, adult: Secondary | ICD-10-CM | POA: Diagnosis not present

## 2024-07-27 DIAGNOSIS — M25569 Pain in unspecified knee: Secondary | ICD-10-CM | POA: Diagnosis not present

## 2024-07-27 DIAGNOSIS — M5451 Vertebrogenic low back pain: Secondary | ICD-10-CM | POA: Diagnosis not present

## 2024-07-27 NOTE — Telephone Encounter (Signed)
 Pharmacy Patient Advocate Encounter  Received notification from [facility] that Prior Authorization for Wegovy  2.4MG /0.75ML auto-injectors has been APPROVED from 07/27/24 to 07/27/25. Ran test claim, Copay is $4. This test claim was processed through [facility]- copay amounts may vary at other pharmacies due to pharmacy/plan contracts, or as the patient moves through the different stages of their insurance plan.   PA #/Case ID/Reference #: [id number]

## 2024-07-27 NOTE — Telephone Encounter (Signed)
 PA request has been Submitted. New Encounter has been or will be created for follow up. For additional info see Pharmacy Prior Auth telephone encounter from 07/20/24.

## 2024-07-27 NOTE — Progress Notes (Signed)
 Leron Glance, NP-C Phone: [phone]  Brandi Bright is a 53 y.o. female who presents today for follow up.   Discussed the use of AI scribe software for clinical note transcription with the patient, who gave verbal consent to proceed.  History of Present Illness   Brandi Bright is a 53 year old female with hypothyroidism and obesity who presents for follow-up on thyroid  function and weight management.  She has been on Wegovy  for weight management and has experienced no side effects such as nausea or vomiting. Over the past six months, she has lost 27 pounds, with her weight decreasing from 209 pounds in February to 182 pounds currently. Her physical activity is limited due to a previous leg injury, which causes soreness and bruising when she is on it for a while. She tries to stay active by swimming when possible, although weather conditions sometimes prevent this.  Regarding her thyroid  condition, she has been taking levothyroxine , although not first thing in the morning due to personal preference. Three months ago, her levothyroxine  dosage was changed, and she had an ultrasound performed. She experiences hair loss, which she attributes to a COVID-19 infection in June. No issues with heart palpitations or temperature regulation are noted.  She reports ear pain that started on Monday night, with discomfort radiating across the back of her neck. The pain is more pronounced on the right side, and she experiences occasional tinnitus. No hearing loss is noted, but there is a feeling of fullness in her right ear. She does not use nasal spray.     Social History   Tobacco Use  Smoking Status Never  Smokeless Tobacco Never    Current Outpatient Medications on File Prior to Visit  Medication Sig Dispense Refill   Cyanocobalamin  (B-12) 2500 MCG SUBL Place 1 tablet under the tongue daily. 90 tablet 1   furosemide  (LASIX ) 20 MG tablet TAKE 1 TABLET BY MOUTH EVERY DAY 30 tablet 2   furosemide   (LASIX ) 40 MG tablet TAKE 1 TABLET BY MOUTH EVERY DAY 90 tablet 1   levothyroxine  (SYNTHROID ) 75 MCG tablet TAKE 1 TABLET BY MOUTH DAILY BEFORE BREAKFAST. 90 tablet 0   nitroGLYCERIN  (NITROLINGUAL ) 0.4 MG/SPRAY spray Place 1 spray under the tongue every 5 (five) minutes x 3 doses as needed for chest pain. 12 g 2   omeprazole  (PRILOSEC) 40 MG capsule TAKE 1 CAPSULE (40 MG TOTAL) BY MOUTH DAILY. 90 capsule 1   oxyCODONE  (OXYCONTIN ) 15 mg 12 hr tablet 15 mg every 12 (twelve) hours.     Oxycodone  HCl 10 MG TABS Take 10 mg by mouth 4 (four) times daily as needed.     Semaglutide -Weight Management 2.4 MG/0.75ML SOAJ Inject 2.4 mg into the skin once a week. 3 mL 2   spironolactone  (ALDACTONE ) 25 MG tablet Take 1 tablet (25 mg total) by mouth daily. 30 tablet 2   spironolactone  (ALDACTONE ) 50 MG tablet Take 1 tablet (50 mg total) by mouth daily. 30 tablet 2   ZTLIDO  1.8 % PTCH Apply 1 patch topically at bedtime.     No current facility-administered medications on file prior to visit.     ROS see history of present illness  Objective  Physical Exam Vitals:   07/27/24 0924  BP: 112/68  Pulse: 72  Resp: 16  SpO2: 98%    BP Readings from Last 3 Encounters:  07/27/24 112/68  04/25/24 110/64  02/09/24 108/73   Wt Readings from Last 3 Encounters:  07/27/24 182 lb 12.8  oz (82.9 kg)  04/25/24 195 lb (88.5 kg)  01/26/24 209 lb 6.4 oz (95 kg)    Physical Exam Constitutional:      General: She is not in acute distress.    Appearance: Normal appearance.  HENT:     Head: Normocephalic.     Right Ear: Tympanic membrane normal.     Left Ear: Tympanic membrane normal.  Cardiovascular:     Rate and Rhythm: Normal rate and regular rhythm.     Heart sounds: Normal heart sounds.  Pulmonary:     Effort: Pulmonary effort is normal.     Breath sounds: Normal breath sounds.  Skin:    General: Skin is warm and dry.  Neurological:     General: No focal deficit present.     Mental Status: She is  alert.  Psychiatric:        Mood and Affect: Mood normal.        Behavior: Behavior normal.      Assessment/Plan: Please see individual problem list.  Acquired hypothyroidism Assessment & Plan: Her hypothyroidism required a previous levothyroxine  dosage adjustment due to abnormal TSH levels. A recent ultrasound was normal. She reports hair loss, possibly related to a recent COVID-19 infection, but no heart palpitations or temperature regulation issues. Check TSH today and continue the current levothyroxine  dosage. Consider referral to Endocrinology if TSH remains uncontrolled.   Orders: -     TSH+T4F+T3Free+ThyAbs+TPO+VD25  Dysfunction of both eustachian tubes Assessment & Plan: She experiences bilateral ear pressure with the right side worse than the left, since Monday night. Exam WNL. Symptoms may be related to Eustachian tube dysfunction. Start Flonase with two sprays each side consistently for one to two weeks. Monitor symptoms and report if persistent.    Class 2 severe obesity due to excess calories with serious comorbidity and body mass index (BMI) of 39.0 to 39.9 in adult [facility]) Assessment & Plan: She has lost 27 pounds since February with Wegovy , now weighing 182 pounds, with no adverse side effects. Continue Wegovy  2.4 mg weekly. Encourage continued lifestyle modifications, healthy diet and regular exercise.      Return in about 6 months (around 01/27/2025) for Follow up.   Leron Glance, NP-C [facility]

## 2024-07-27 NOTE — Assessment & Plan Note (Signed)
 She has lost 27 pounds since February with Wegovy , now weighing 182 pounds, with no adverse side effects. Continue Wegovy  2.4 mg weekly. Encourage continued lifestyle modifications, healthy diet and regular exercise.

## 2024-07-27 NOTE — Telephone Encounter (Signed)
 Clinical questions have been answered and PA submitted. PA currently Pending.

## 2024-07-27 NOTE — Assessment & Plan Note (Signed)
 She experiences bilateral ear pressure with the right side worse than the left, since Monday night. Exam WNL. Symptoms may be related to Eustachian tube dysfunction. Start Flonase with two sprays each side consistently for one to two weeks. Monitor symptoms and report if persistent.

## 2024-07-27 NOTE — Assessment & Plan Note (Signed)
 Her hypothyroidism required a previous levothyroxine  dosage adjustment due to abnormal TSH levels. A recent ultrasound was normal. She reports hair loss, possibly related to a recent COVID-19 infection, but no heart palpitations or temperature regulation issues. Check TSH today and continue the current levothyroxine  dosage. Consider referral to Endocrinology if TSH remains uncontrolled.

## 2024-07-29 LAB — TSH+T4F+T3FREE+THYABS+TPO+VD25
Free T4: 1.23 ng/dL (ref 0.82–1.77)
T3, Free: 2.7 pg/mL (ref 2.0–4.4)
TSH: 1.52 u[IU]/mL (ref 0.450–4.500)
Thyroglobulin Antibody: <1 [IU]/mL (ref 0.0–0.9)
Thyroperoxidase Ab SerPl-aCnc: 112 [IU]/mL — ABNORMAL HIGH (ref 0–34)
Vit D, 25-Hydroxy: 45.3 ng/mL (ref 30.0–100.0)

## 2024-08-01 ENCOUNTER — Ambulatory Visit: Payer: Self-pay | Admitting: Nurse Practitioner

## 2024-08-08 ENCOUNTER — Inpatient Hospital Stay: Payer: Medicaid Other | Attending: Oncology

## 2024-08-08 DIAGNOSIS — R161 Splenomegaly, not elsewhere classified: Secondary | ICD-10-CM | POA: Diagnosis not present

## 2024-08-08 DIAGNOSIS — K7460 Unspecified cirrhosis of liver: Secondary | ICD-10-CM | POA: Diagnosis not present

## 2024-08-08 DIAGNOSIS — D509 Iron deficiency anemia, unspecified: Secondary | ICD-10-CM | POA: Insufficient documentation

## 2024-08-08 DIAGNOSIS — Z803 Family history of malignant neoplasm of breast: Secondary | ICD-10-CM | POA: Diagnosis not present

## 2024-08-08 DIAGNOSIS — D709 Neutropenia, unspecified: Secondary | ICD-10-CM | POA: Insufficient documentation

## 2024-08-08 DIAGNOSIS — Z801 Family history of malignant neoplasm of trachea, bronchus and lung: Secondary | ICD-10-CM | POA: Diagnosis not present

## 2024-08-08 DIAGNOSIS — E538 Deficiency of other specified B group vitamins: Secondary | ICD-10-CM | POA: Diagnosis not present

## 2024-08-08 DIAGNOSIS — D696 Thrombocytopenia, unspecified: Secondary | ICD-10-CM

## 2024-08-08 DIAGNOSIS — Z800 Family history of malignant neoplasm of digestive organs: Secondary | ICD-10-CM | POA: Insufficient documentation

## 2024-08-08 LAB — CBC WITH DIFFERENTIAL (CANCER CENTER ONLY)
Abs Immature Granulocytes: 0 K/uL (ref 0.00–0.07)
Basophils Absolute: 0 K/uL (ref 0.0–0.1)
Basophils Relative: 1 %
Eosinophils Absolute: 0.1 K/uL (ref 0.0–0.5)
Eosinophils Relative: 3 %
HCT: 36.8 % (ref 36.0–46.0)
Hemoglobin: 12.7 g/dL (ref 12.0–15.0)
Immature Granulocytes: 0 %
Lymphocytes Relative: 30 %
Lymphs Abs: 0.6 K/uL — ABNORMAL LOW (ref 0.7–4.0)
MCH: 31.1 pg (ref 26.0–34.0)
MCHC: 34.5 g/dL (ref 30.0–36.0)
MCV: 90.2 fL (ref 80.0–100.0)
Monocytes Absolute: 0.1 K/uL (ref 0.1–1.0)
Monocytes Relative: 6 %
Neutro Abs: 1.2 K/uL — ABNORMAL LOW (ref 1.7–7.7)
Neutrophils Relative %: 60 %
Platelet Count: 40 K/uL — ABNORMAL LOW (ref 150–400)
RBC: 4.08 MIL/uL (ref 3.87–5.11)
RDW: 13 % (ref 11.5–15.5)
WBC Count: 2 K/uL — ABNORMAL LOW (ref 4.0–10.5)
nRBC: 0 % (ref 0.0–0.2)

## 2024-08-08 LAB — IRON AND TIBC
Iron: 81 ug/dL (ref 28–170)
Saturation Ratios: 19 % (ref 10.4–31.8)
TIBC: 430 ug/dL (ref 250–450)
UIBC: 349 ug/dL

## 2024-08-08 LAB — VITAMIN B12: Vitamin B-12: 1924 pg/mL — ABNORMAL HIGH (ref 180–914)

## 2024-08-08 LAB — FERRITIN: Ferritin: 11 ng/mL (ref 11–307)

## 2024-08-09 ENCOUNTER — Encounter: Payer: Self-pay | Admitting: Oncology

## 2024-08-09 ENCOUNTER — Inpatient Hospital Stay: Payer: Medicaid Other | Admitting: Oncology

## 2024-08-09 VITALS — BP 110/84 | HR 66 | Temp 96.9°F | Resp 18 | Wt 185.9 lb

## 2024-08-09 DIAGNOSIS — Z800 Family history of malignant neoplasm of digestive organs: Secondary | ICD-10-CM | POA: Diagnosis not present

## 2024-08-09 DIAGNOSIS — K7469 Other cirrhosis of liver: Secondary | ICD-10-CM | POA: Diagnosis not present

## 2024-08-09 DIAGNOSIS — K7460 Unspecified cirrhosis of liver: Secondary | ICD-10-CM | POA: Diagnosis not present

## 2024-08-09 DIAGNOSIS — Z801 Family history of malignant neoplasm of trachea, bronchus and lung: Secondary | ICD-10-CM | POA: Diagnosis not present

## 2024-08-09 DIAGNOSIS — D696 Thrombocytopenia, unspecified: Secondary | ICD-10-CM

## 2024-08-09 DIAGNOSIS — E538 Deficiency of other specified B group vitamins: Secondary | ICD-10-CM

## 2024-08-09 DIAGNOSIS — R161 Splenomegaly, not elsewhere classified: Secondary | ICD-10-CM | POA: Diagnosis not present

## 2024-08-09 DIAGNOSIS — D709 Neutropenia, unspecified: Secondary | ICD-10-CM | POA: Diagnosis not present

## 2024-08-09 DIAGNOSIS — Z803 Family history of malignant neoplasm of breast: Secondary | ICD-10-CM | POA: Diagnosis not present

## 2024-08-09 DIAGNOSIS — D500 Iron deficiency anemia secondary to blood loss (chronic): Secondary | ICD-10-CM

## 2024-08-09 DIAGNOSIS — D509 Iron deficiency anemia, unspecified: Secondary | ICD-10-CM | POA: Diagnosis not present

## 2024-08-09 NOTE — Assessment & Plan Note (Addendum)
 Lab Results  Component Value Date   HGB 12.7 08/08/2024   TIBC 430 08/08/2024   IRONPCTSAT 19 08/08/2024   FERRITIN 11 08/08/2024  Hemoglobin is stable ferritin has decreased.  Recommend Venofer  200mg  x 1

## 2024-08-09 NOTE — Assessment & Plan Note (Signed)
 ANC Is stable.

## 2024-08-09 NOTE — Progress Notes (Signed)
 Hematology/Oncology Progress note [phone] [phone]    Chief Complaint: Brandi Bright is a 53 y.o. female follows up for management of iron  deficiency anemia, thrombocytopenia, and leukopenia   ASSESSMENT & PLAN:   B12 deficiency Hold off B12 sublingual 2500mcg supplementation for 3 weeks, then change dosage to once a week.   Iron  deficiency anemia Lab Results  Component Value Date   HGB 12.7 08/08/2024   TIBC 430 08/08/2024   IRONPCTSAT 19 08/08/2024   FERRITIN 11 08/08/2024  Hemoglobin is stable ferritin has decreased.  Recommend Venofer  200mg  x 1   Thrombocytopenia (HCC) # Chronic neutropenia and thrombocytopenia Previous Bone marrow biopsy work-up was negative  Low counts are due to chronic cirrhosis/splenomegaly. Counts are stable.      Neutropenia (HCC) ANC Is stable.    Other cirrhosis of liver (HCC) Referred to re-establish care with Dr. Unk.   Orders Placed This Encounter  Procedures   CBC with Differential (Cancer Center Only)    Standing Status:   Future    Expected Date:   02/09/2025    Expiration Date:   05/10/2025   Iron  and TIBC    Standing Status:   Future    Expected Date:   02/09/2025    Expiration Date:   05/10/2025   Ferritin    Standing Status:   Future    Expected Date:   02/09/2025    Expiration Date:   05/10/2025   Vitamin B12    Standing Status:   Future    Expected Date:   02/09/2025    Expiration Date:   05/10/2025   Retic Panel    Standing Status:   Future    Expected Date:   02/09/2025    Expiration Date:   05/10/2025   Hepatic function panel    Standing Status:   Future    Expected Date:   02/09/2025    Expiration Date:   05/10/2025   Ambulatory referral to Gastroenterology    Referral Priority:   Routine    Referral Type:   Consultation    Referral Reason:   Specialty Services Required    Number of Visits Requested:   1   Follow up 6 months.  All questions were answered. The patient knows to call  the clinic with any problems, questions or concerns.  Zelphia Cap, MD, PhD [facility] Hematology Oncology 08/09/2024    PERTINENT HEMATOLOGY HISTORY Patient follows up with Dr. Rudell previously.  Establish care with me on 02/09/2019. Reviewed patient's previous medical records, labs, imaging results. # History of cirrhosis and hepatitis C.  She has a history of chronic anemia, thrombocytopenia and leukopenia dating back to 03/2012.  Platelet count has fluctuated between 54,000 - 56,000 since 12/28/2017 (previously 73,000 - 134,000).  Ferritin was 14 on 08/31/2018.    Work-up on 09/23/2018 revealed a hematocrit of 31.0, hemoglobin 10.1, MCV 87.6, platelets 46,000, WBC 1900 with an ANC of 1100.  Ferritin was 10 (low) with iron  saturation 16% with a TIBC of 401.  Retic was 1.2%.  B12 was 315.  Normal studies included: folate, SPEP, and free light chain ratio.  Copper  was 69 (72-166).  ANA was + with double stranded DNA antibody 13 (0-9).  TSH was 0.036 (0.35-4.5) with a free T4 1.05 (0.82-1.77).  Peripheral smear revealed variant lymphocytes.  Ferritin has been followed:  14 on 08/31/2018 and 10 on 09/23/2018.  Abdomen and pelvic CT on 07/14/2018 revealed cirrhosis with portal hypertension noted by prominent splenomegaly (  18.5 x 13.8 x 8.1 cm; volume 1100 cm3), enlarged portal veins with recannulated umbilical vein, paraesophageal and perigastric varices.  There was mild thickening of the cecum and ascending colon.  EGD on 10/27/2018 revealed grade II esophageal varices.  There was erythematous mucosa in the antrum.  There was congested, erythematous, friable (with contact bleeding), granular and nodular mucosa in the gastric fundus and astric body.  There was a single gastric polyp (polypoid ulcerated antral type mucos with chronic active mucosal inflammation).  There was a normal duodenal bulb, second portion of the duodenum and examined duodenum.  The patient's iron  deficiency could be explained by  her friable gastric mucosa (likely from portal hypertension).  There was no history of variceal bleeding and thus this is not a cause of her iron  deficiency.  # admitted from 10/11/2019 to 10/12/2019 due to rectal bleeding EGD 10/12/2019 showed portal hypertensive gastropathy, treated with APC.  Nonbleeding large esophageal varices incompletely evaluated.  Banded. Patient follows with gastroenterology and had EGD on 11/20/2019. Banding was not done due to large amount of food in the stomach. There was plan for EGD on 11/23/2019.  Her gastroenterologist Dr. Janalyn contacted me to see if patient can get platelet transfusion to improve her platelet counts for banding on 11/23/2019.     INTERVAL HISTORY Brandi Bright is a 53 y.o. female who has above history reviewed by me today presents for follow up visit for management of thrombocytopenia, splenomegaly, leukopenia and anemia. She feels well today. Denies weight loss, fever, chills, fatigue, night sweats.  + easy bruising She follows up with GI and had recent EGD. She denies blood in stool.   Review of Systems  Constitutional:  Positive for fatigue. Negative for appetite change, chills and fever.  HENT:   Negative for hearing loss and voice change.   Eyes:  Negative for eye problems.  Respiratory:  Negative for chest tightness and cough.   Cardiovascular:  Negative for chest pain.  Gastrointestinal:  Negative for abdominal distention, abdominal pain and blood in stool.  Endocrine: Negative for hot flashes.  Genitourinary:  Negative for difficulty urinating and frequency.   Musculoskeletal:  Negative for arthralgias.  Skin:  Negative for itching and rash.  Neurological:  Negative for extremity weakness.  Hematological:  Negative for adenopathy. Bruises/bleeds easily.  Psychiatric/Behavioral:  Negative for confusion.    Past Medical History:  Diagnosis Date   Arthritis    B12 deficiency 07/20/2023   Back pain    Chronic knee pain     Cirrhosis (HCC)    Dysphagia    Dyspnea    GI bleed 06/24/2019   Gout    Heart attack (HCC)    History of blood transfusion    HLD (hyperlipidemia)    Hypothyroidism    IDA (iron  deficiency anemia) 01/16/2020   Iron  deficiency anemia due to chronic blood loss 01/16/2020   Leukopenia 09/23/2018   MI, old    Oropharyngeal dysphagia 04/07/2021   Pleurisy 01/13/2022   Sinusitis 04/07/2021   Stomach irritation    Thyroid  disease     Past Surgical History:  Procedure Laterality Date   CARDIAC CATHETERIZATION     CESAREAN SECTION     COLONOSCOPY WITH PROPOFOL  N/A 03/16/2018   Procedure: COLONOSCOPY WITH PROPOFOL ;  Surgeon: Janalyn Keene NOVAK, MD;  Location: [facility] ENDOSCOPY;  Service: Endoscopy;  Laterality: N/A;   COLONOSCOPY WITH PROPOFOL  N/A 07/09/2021   Procedure: COLONOSCOPY WITH PROPOFOL ;  Surgeon: Janalyn Keene NOVAK, MD;  Location: [facility] ENDOSCOPY;  Service: Endoscopy;  Laterality: N/A;   ESOPHAGOGASTRODUODENOSCOPY N/A 10/11/2019   Procedure: ESOPHAGOGASTRODUODENOSCOPY (EGD);  Surgeon: Unk Corinn Skiff, MD;  Location: [facility] ENDOSCOPY;  Service: Gastroenterology;  Laterality: N/A;   ESOPHAGOGASTRODUODENOSCOPY (EGD) WITH PROPOFOL  N/A 10/27/2018   Procedure: ESOPHAGOGASTRODUODENOSCOPY (EGD) WITH PROPOFOL ;  Surgeon: Janalyn Keene NOVAK, MD;  Location: [facility] ENDOSCOPY;  Service: Endoscopy;  Laterality: N/A;   ESOPHAGOGASTRODUODENOSCOPY (EGD) WITH PROPOFOL  N/A 06/25/2019   Procedure: ESOPHAGOGASTRODUODENOSCOPY (EGD) WITH PROPOFOL ;  Surgeon: Therisa Bi, MD;  Location: [facility] ENDOSCOPY;  Service: Gastroenterology;  Laterality: N/A;   ESOPHAGOGASTRODUODENOSCOPY (EGD) WITH PROPOFOL  N/A 09/12/2019   Procedure: ESOPHAGOGASTRODUODENOSCOPY (EGD) WITH PROPOFOL ;  Surgeon: Jinny Carmine, MD;  Location: [facility] ENDOSCOPY;  Service: Endoscopy;  Laterality: N/A;   ESOPHAGOGASTRODUODENOSCOPY (EGD) WITH PROPOFOL  N/A 11/20/2019   Procedure: ESOPHAGOGASTRODUODENOSCOPY (EGD) WITH PROPOFOL ;  Surgeon: Janalyn Keene NOVAK, MD;  Location: [facility] ENDOSCOPY;  Service: Endoscopy;  Laterality: N/A;   ESOPHAGOGASTRODUODENOSCOPY (EGD) WITH PROPOFOL  N/A 11/23/2019   Procedure: ESOPHAGOGASTRODUODENOSCOPY (EGD) WITH PROPOFOL ;  Surgeon: Janalyn Keene NOVAK, MD;  Location: [facility] ENDOSCOPY;  Service: Endoscopy;  Laterality: N/A;   ESOPHAGOGASTRODUODENOSCOPY (EGD) WITH PROPOFOL  N/A 01/03/2020   Procedure: ESOPHAGOGASTRODUODENOSCOPY (EGD) WITH PROPOFOL ;  Surgeon: Unk Corinn Skiff, MD;  Location: [facility] ENDOSCOPY;  Service: Endoscopy;  Laterality: N/A;   ESOPHAGOGASTRODUODENOSCOPY (EGD) WITH PROPOFOL  N/A 07/09/2021   Procedure: ESOPHAGOGASTRODUODENOSCOPY (EGD) WITH PROPOFOL ;  Surgeon: Janalyn Keene NOVAK, MD;  Location: [facility] ENDOSCOPY;  Service: Endoscopy;  Laterality: N/A;   ESOPHAGOGASTRODUODENOSCOPY (EGD) WITH PROPOFOL  N/A 05/17/2023   Procedure: ESOPHAGOGASTRODUODENOSCOPY (EGD) WITH PROPOFOL ;  Surgeon: Unk Corinn Skiff, MD;  Location: [facility] ENDOSCOPY;  Service: Gastroenterology;  Laterality: N/A;   ESOPHAGOGASTRODUODENOSCOPY (EGD) WITH PROPOFOL  N/A 09/27/2023   Procedure: ESOPHAGOGASTRODUODENOSCOPY (EGD) WITH PROPOFOL ;  Surgeon: Unk Corinn Skiff, MD;  Location: [facility] ENDOSCOPY;  Service: Gastroenterology;  Laterality: N/A;   ESOPHAGOGASTRODUODENOSCOPY (EGD) WITH PROPOFOL  N/A 12/02/2023   Procedure: ESOPHAGOGASTRODUODENOSCOPY (EGD) WITH PROPOFOL ;  Surgeon: Unk Corinn Skiff, MD;  Location: [facility] ENDOSCOPY;  Service: Gastroenterology;  Laterality: N/A;   ESOPHAGOGASTRODUODENOSCOPY (EGD) WITH PROPOFOL  N/A 12/30/2023   Procedure: ESOPHAGOGASTRODUODENOSCOPY (EGD) WITH PROPOFOL ;  Surgeon: Unk Corinn Skiff, MD;  Location: [facility] ENDOSCOPY;  Service: Gastroenterology;  Laterality: N/A;   HEMOSTASIS CLIP PLACEMENT  09/27/2023   Procedure: HEMOSTASIS CLIP PLACEMENT;  Surgeon: Unk Corinn Skiff, MD;  Location: [facility] ENDOSCOPY;  Service: Gastroenterology;;   POLYPECTOMY  09/27/2023   Procedure: POLYPECTOMY;  Surgeon: Unk Corinn Skiff, MD;  Location: [facility] ENDOSCOPY;  Service: Gastroenterology;;   TUBAL LIGATION      Family History  Problem Relation Age of Onset   Stroke Mother    Hypertension Mother    Hyperlipidemia Mother    Diabetes Mother    COPD Mother    Cancer Mother    Arthritis Mother    Hypertension Father    COPD Father    Cancer Father    CAD Father    Colon cancer Father    Lung cancer Father    Heart attack Father    Stroke Sister    Kidney disease Sister    Hypertension Sister    Hyperlipidemia Sister    Diabetes Sister    COPD Sister    Arthritis Sister    Hyperlipidemia Sister    Arthritis Sister    Stroke Sister    Hypertension Sister    Hyperlipidemia Sister    Heart disease Sister    Heart attack Sister    Diabetes Sister    COPD Sister    Asthma Sister  Arthritis Sister    Breast cancer Maternal Aunt 60 - 59   Arthritis Maternal Grandmother    Arthritis Maternal Grandfather    Arthritis Paternal Grandmother    Arthritis Paternal Grandfather    Breast cancer Cousin 50 - 20   Stroke Brother    Hyperlipidemia Brother    COPD Brother    Arthritis Brother    Pancreatic cancer Brother    Hypertension Other    Social History   Socioeconomic History   Marital status: Single    Spouse name: Not on file   Number of children: Not on file   Years of education: Not on file   Highest education level: Not on file  Occupational History   Not on file  Tobacco Use   Smoking status: Never   Smokeless tobacco: Never  Vaping Use   Vaping status: Never Used  Substance and Sexual Activity   Alcohol use: Yes    Comment: occ   Drug use: Yes    Comment: prescribed oxycodone  and oxycotin   Sexual activity: Not Currently  Other Topics Concern   Not on file  Social History Narrative   Not on file   Social Drivers of Health   Financial Resource Strain: Medium Risk (10/11/2019)   Overall Financial Resource Strain (CARDIA)    Difficulty of Paying Living Expenses:  Somewhat hard  Food Insecurity: No Food Insecurity (04/01/2023)   Hunger Vital Sign    Worried About Running Out of Food in the Last Year: Never true    Ran Out of Food in the Last Year: Never true  Transportation Needs: No Transportation Needs (04/01/2023)   PRAPARE - Administrator, Civil Service (Medical): No    Lack of Transportation (Non-Medical): No  Physical Activity: Inactive (10/11/2019)   Exercise Vital Sign    Days of Exercise per Week: 0 days    Minutes of Exercise per Session: 0 min  Stress: Stress Concern Present (10/11/2019)   [employer] of Occupational Health - Occupational Stress Questionnaire    Feeling of Stress : Rather much  Social Connections: Unknown (10/11/2019)   Social Connection and Isolation Panel    Frequency of Communication with Friends and Family: More than three times a week    Frequency of Social Gatherings with Friends and Family: More than three times a week    Attends Religious Services: Never    Database administrator or Organizations: No    Attends Banker Meetings: Not asked    Marital Status: Not on file  Intimate Partner Violence: Not At Risk (04/01/2023)   Humiliation, Afraid, Rape, and Kick questionnaire    Fear of Current or Ex-Partner: No    Emotionally Abused: No    Physically Abused: No    Sexually Abused: No    Allergies:  Allergies  Allergen Reactions   Vicodin [Hydrocodone-Acetaminophen ] Rash   Amoxicillin Other (See Comments) and Rash    Has patient had a PCN reaction causing immediate rash, facial/tongue/throat swelling, SOB or lightheadedness with hypotension: Yes  Has patient had a PCN reaction causing severe rash involving mucus membranes or skin necrosis: No  Has patient had a PCN reaction that required hospitalization: No  Has patient had a PCN reaction occurring within the last 10 years: No  If all of the above answers are NO, then may proceed with Cephalosporin use.  Other  Reaction(s): Not available, Not available   Gabapentin Rash    Current Medications: Current Outpatient Medications  Medication Sig Dispense Refill   Cyanocobalamin  (B-12) 2500 MCG SUBL Place 1 tablet under the tongue daily. 90 tablet 1   nitroGLYCERIN  (NITROLINGUAL ) 0.4 MG/SPRAY spray Place 1 spray under the tongue every 5 (five) minutes x 3 doses as needed for chest pain. 12 g 2   omeprazole  (PRILOSEC) 40 MG capsule TAKE 1 CAPSULE (40 MG TOTAL) BY MOUTH DAILY. 90 capsule 1   oxyCODONE  (OXYCONTIN ) 15 mg 12 hr tablet 15 mg every 12 (twelve) hours.     Oxycodone  HCl 10 MG TABS Take 10 mg by mouth 4 (four) times daily as needed.     Semaglutide -Weight Management 2.4 MG/0.75ML SOAJ Inject 2.4 mg into the skin once a week. 3 mL 2   ZTLIDO  1.8 % PTCH Apply 1 patch topically at bedtime.     furosemide  (LASIX ) 20 MG tablet TAKE 1 TABLET BY MOUTH EVERY DAY (Patient not taking: Reported on 08/09/2024) 30 tablet 2   furosemide  (LASIX ) 40 MG tablet TAKE 1 TABLET BY MOUTH EVERY DAY (Patient not taking: Reported on 08/09/2024) 90 tablet 1   levothyroxine  (SYNTHROID ) 75 MCG tablet TAKE 1 TABLET BY MOUTH DAILY BEFORE BREAKFAST. 90 tablet 0   spironolactone  (ALDACTONE ) 25 MG tablet Take 1 tablet (25 mg total) by mouth daily. (Patient not taking: Reported on 08/09/2024) 30 tablet 2   spironolactone  (ALDACTONE ) 50 MG tablet Take 1 tablet (50 mg total) by mouth daily. (Patient not taking: Reported on 08/09/2024) 30 tablet 2   No current facility-administered medications for this visit.     Physical Exam: Blood pressure 110/84, pulse 66, temperature (!) 96.9 F (36.1 C), temperature source Tympanic, resp. rate 18, weight 185 lb 14.4 oz (84.3 kg), SpO2 99%.   Physical Exam Constitutional:      General: She is not in acute distress.    Appearance: She is not diaphoretic.  HENT:     Head: Normocephalic and atraumatic.  Eyes:     General: No scleral icterus. Cardiovascular:     Rate and Rhythm: Normal rate and  regular rhythm.     Heart sounds: No murmur heard. Pulmonary:     Effort: Pulmonary effort is normal. No respiratory distress.     Breath sounds: Normal breath sounds.  Abdominal:     General: Bowel sounds are normal.     Palpations: Abdomen is soft.     Comments: Splenomegaly  Musculoskeletal:        General: Normal range of motion.     Cervical back: Normal range of motion and neck supple.  Skin:    General: Skin is warm and dry.     Findings: No erythema.  Neurological:     Mental Status: She is alert and oriented to person, place, and time. Mental status is at baseline.     Cranial Nerves: No cranial nerve deficit.     Motor: No abnormal muscle tone.  Psychiatric:        Mood and Affect: Affect normal.    Labs    Latest Ref Rng & Units 08/08/2024   10:39 AM 02/02/2024    9:38 AM 12/28/2023    3:39 PM  CBC  WBC 4.0 - 10.5 K/uL 2.0  2.1  2.6   Hemoglobin 12.0 - 15.0 g/dL 87.2  86.7  86.5   Hematocrit 36.0 - 46.0 % 36.8  37.8  39.0   Platelets 150 - 400 K/uL 40  43  116       Latest Ref Rng & Units 02/02/2024  9:38 AM 11/24/2023    2:17 PM 08/11/2023    4:07 PM  CMP  Glucose 70 - 99 mg/dL  95  86   BUN 6 - 24 mg/dL  11  9   Creatinine 9.42 - 1.00 mg/dL  9.27  9.33   Sodium 865 - 144 mmol/L  142  142   Potassium 3.5 - 5.2 mmol/L  3.7  4.3   Chloride 96 - 106 mmol/L  105  110   CO2 20 - 29 mmol/L  24  26   Calcium  8.7 - 10.2 mg/dL  8.8  9.3   Total Protein 6.5 - 8.1 g/dL 6.1   6.5   Total Bilirubin 0.0 - 1.2 mg/dL 1.2   1.0   Alkaline Phos 38 - 126 U/L 78   95   AST 15 - 41 U/L 37   29   ALT 0 - 44 U/L 27   17    Lab Results  Component Value Date   IRON  81 08/08/2024   TIBC 430 08/08/2024   FERRITIN 11 08/08/2024     RADIOGRAPHIC STUDIES: I have personally reviewed the radiological images as listed and agreed with the findings in the report. No results found.

## 2024-08-09 NOTE — Assessment & Plan Note (Signed)
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 Counts are stable.

## 2024-08-09 NOTE — Assessment & Plan Note (Addendum)
 Hold off B12 sublingual 2500mcg supplementation for 3 weeks, then change dosage to once a week.

## 2024-08-09 NOTE — Assessment & Plan Note (Signed)
 Referred to re-establish care with Dr. Unk.

## 2024-08-10 ENCOUNTER — Telehealth: Payer: Self-pay

## 2024-08-10 NOTE — Telephone Encounter (Signed)
 GI referral faxed to [facility] for pt to establish care with Dr. Unk. Mz:[id number]. Fax confirmation received.

## 2024-08-15 ENCOUNTER — Telehealth: Payer: Self-pay | Admitting: Oncology

## 2024-08-15 NOTE — Telephone Encounter (Signed)
 Pt called and left vm asking to move appt to a later time - changed appt w/pt - [facility]

## 2024-08-17 ENCOUNTER — Inpatient Hospital Stay

## 2024-08-17 ENCOUNTER — Other Ambulatory Visit: Payer: Self-pay | Admitting: Nurse Practitioner

## 2024-08-17 ENCOUNTER — Inpatient Hospital Stay: Attending: Oncology

## 2024-08-17 VITALS — BP 107/67 | HR 62 | Temp 98.7°F | Resp 18

## 2024-08-17 DIAGNOSIS — D500 Iron deficiency anemia secondary to blood loss (chronic): Secondary | ICD-10-CM

## 2024-08-17 DIAGNOSIS — D509 Iron deficiency anemia, unspecified: Secondary | ICD-10-CM | POA: Insufficient documentation

## 2024-08-17 MED ORDER — IRON SUCROSE 20 MG/ML IV SOLN
200.0000 mg | Freq: Once | INTRAVENOUS | Status: AC
  Administered 2024-08-17: 200 mg via INTRAVENOUS
  Filled 2024-08-17: qty 10

## 2024-08-17 NOTE — Progress Notes (Signed)
 Patient tolerated Venofer  without any complications. She declined to stay for 30 minute observation. Patient aware to go to ED for any signs of allergic reaction.

## 2024-08-31 DIAGNOSIS — I8510 Secondary esophageal varices without bleeding: Secondary | ICD-10-CM | POA: Diagnosis not present

## 2024-08-31 DIAGNOSIS — K7581 Nonalcoholic steatohepatitis (NASH): Secondary | ICD-10-CM | POA: Diagnosis not present

## 2024-08-31 DIAGNOSIS — K7460 Unspecified cirrhosis of liver: Secondary | ICD-10-CM | POA: Diagnosis not present

## 2024-08-31 DIAGNOSIS — Z860100 Personal history of colon polyps, unspecified: Secondary | ICD-10-CM | POA: Diagnosis not present

## 2024-09-01 ENCOUNTER — Telehealth: Payer: Self-pay | Admitting: Pharmacy Technician

## 2024-09-01 ENCOUNTER — Other Ambulatory Visit: Payer: Self-pay | Admitting: Gastroenterology

## 2024-09-01 ENCOUNTER — Telehealth: Payer: Self-pay | Admitting: Pharmacist

## 2024-09-01 ENCOUNTER — Other Ambulatory Visit: Payer: Self-pay

## 2024-09-01 ENCOUNTER — Other Ambulatory Visit (HOSPITAL_COMMUNITY): Payer: Self-pay

## 2024-09-01 ENCOUNTER — Other Ambulatory Visit: Payer: Self-pay | Admitting: Pharmacy Technician

## 2024-09-01 ENCOUNTER — Telehealth: Payer: Self-pay

## 2024-09-01 DIAGNOSIS — D500 Iron deficiency anemia secondary to blood loss (chronic): Secondary | ICD-10-CM

## 2024-09-01 DIAGNOSIS — K7460 Unspecified cirrhosis of liver: Secondary | ICD-10-CM

## 2024-09-01 DIAGNOSIS — D696 Thrombocytopenia, unspecified: Secondary | ICD-10-CM

## 2024-09-01 MED ORDER — DOPTELET 20 MG PO TABS
60.0000 mg | ORAL_TABLET | Freq: Every day | ORAL | 0 refills | Status: AC
  Filled 2024-09-01: qty 15, 5d supply, fill #0

## 2024-09-01 NOTE — Telephone Encounter (Signed)
 Oral Oncology Patient Advocate Encounter  After completing a benefits investigation, prior authorization for Doptelet  is not required at this time through [facility].  Patient's copay is $4.     Cordon Gassett (Patty) Chet Burnet, CPhT  [facility], [place], [place] Hematology/Oncology - Oral Chemotherapy Patient Advocate Specialist III Phone: [phone]  Fax: [phone]

## 2024-09-01 NOTE — Telephone Encounter (Signed)
 Clinical Pharmacist Practitioner Encounter   [facility] (Specialty) will deliver medication to her next week. She knows with her EGD scheduled for 10/05/24, she will wait to start her avatrombobag in the range of 09/23/24-09/26/24.  Patient Education I spoke with patient for overview of new oral chemotherapy medication: Doptelet  (avatrombopag) for the treatment of chronic liver disease associated thrombocytopenia in preparation for a scheduled EDG and colonoscopy.   Counseled patient on administration, dosing, side effects, monitoring, drug-food interactions, safe handling, storage, and disposal. Patient will take 3 tablets (60 mg total) by mouth daily. Take for 5 days. Take with food. Beginning 10 to 13 days prior to scheduled procedure.   Side effects include but not limited to: nausea.    Reviewed with patient importance of keeping a medication schedule and plan for any missed doses.  After discussion with patient no patient barriers to medication adherence identified.   Brandi Bright voiced understanding and appreciation. All questions answered. Medication handout provided.  Provided patient with Oral Chemotherapy Navigation Clinic phone number. Patient knows to call the office with questions or concerns. Oral Chemotherapy Navigation Clinic will continue to follow.  Brandi Bright, PharmD, BCOP, CPP Hematology/Oncology Clinical Pharmacist [facility] Oral Chemotherapy Navigation Clinic [phone]  09/01/2024 2:46 PM

## 2024-09-01 NOTE — Telephone Encounter (Addendum)
 Pharmacy Student Encounter   Received new prescription for Doptelet  (avatrombopag) for the treatment of chronic liver disease associated thrombocytopenia in preparation for a scheduled EDG and colonoscopy. Avatrombopag should be initiated 10 to 13 days before the scheduled procedure. Avatrombopag will be taken once daily for 5 consecutive days.   CBC from 08/08/24 assessed, platelet count was 40. Prescription dose and frequency assessed. Patient will take 60 mg (3 tablets) orally once a day for 5 consecutive days; starting 10 to 13 days before the scheduled procedure.   Current medication list in Epic reviewed, no DDIs with avatrombopag identified.  Evaluated chart and no patient barriers to medication adherence identified.   Prescription has been e-scribed to the [facility] for benefits analysis and approval.  Oral Oncology Clinic will continue to follow for insurance authorization, copayment issues, initial counseling and start date.   Signe JINNY Platt, PharmD Candidate 2026  [facility] Oral Chemotherapy Navigation Clinic [phone]  09/01/2024 11:58 AM

## 2024-09-01 NOTE — Telephone Encounter (Signed)
 EGD scheduled for scheduled on 10/05/24. She can start her avatrombobag in the range of 09/23/24-09/26/24.

## 2024-09-01 NOTE — Telephone Encounter (Signed)
-----   Message from Zelphia Cap sent at 08/31/2024 10:11 PM EDT ----- Regarding: FW: [facility] pt - doptolet Hi Alyson and Patty,  Please arrange patient to have Avatrombopag 60 mg once daily for 5 consecutive days, start 10 to 13 days prior to the scheduled procedure.  Almarie, please arrange her to get cbc hold tube 1-2 days prior to her endoscopy.  Keep same follow up   zy ----- Message ----- From: Unk Corinn Skiff, MD Sent: 08/31/2024   4:03 PM EDT To: Zelphia Cap, MD Subject: [facility] pt - doptolet                           Hey Dr. Cap, I saw Ms. Atteberry in office today.  Could you please arrange for Doptelet .  She is scheduled to undergo EGD and colonoscopy in next 2 to 3 weeks  Thanks RV

## 2024-09-01 NOTE — Telephone Encounter (Signed)
 Oral Oncology Patient Advocate Encounter  Patient successfully OnBoarded and drug education provided by pharmacist. Medication scheduled to be shipped on 09/22 for delivery on 09/23 from [facility] to patient's address. Patient also knows to call me at [phone] with any questions or concerns regarding receiving medication or if there is any unexpected change in co-pay.   Albert Devaul (Patty) Chet Burnet, CPhT  [facility], [place], [place] Hematology/Oncology - Oral Chemotherapy Patient Advocate Specialist III Phone: [phone]  Fax: [phone]

## 2024-09-01 NOTE — Progress Notes (Signed)
 Specialty Pharmacy Initial Fill Coordination Note  Brandi Bright is a 53 y.o. female contacted today regarding refills of specialty medication(s) Avatrombopag Maleate  (Doptelet ) .  Patient requested Delivery  on 09/05/24  to verified address [address] [place] [address]   Medication will be filled on 09/22.   Patient is aware of $4 copayment.   [name] (Patty) Chet Burnet, CPhT  [facility], [place], [place] Hematology/Oncology - Oral Chemotherapy Patient Advocate Specialist III Phone: [phone]  Fax: [phone]

## 2024-09-01 NOTE — Progress Notes (Signed)
 Patient education documented in EPIC note on 09/01/24.  One time 5 day fill, clinical f/u not added.   No refill call needed   Disenrolling patient d/t one time fill

## 2024-09-04 ENCOUNTER — Other Ambulatory Visit: Payer: Self-pay

## 2024-09-08 ENCOUNTER — Ambulatory Visit
Admission: RE | Admit: 2024-09-08 | Discharge: 2024-09-08 | Disposition: A | Discharge status: Home / Self Care | Source: Ambulatory Visit | Attending: Gastroenterology | Admitting: Gastroenterology

## 2024-09-08 DIAGNOSIS — K7581 Nonalcoholic steatohepatitis (NASH): Secondary | ICD-10-CM | POA: Insufficient documentation

## 2024-09-08 DIAGNOSIS — K7460 Unspecified cirrhosis of liver: Secondary | ICD-10-CM | POA: Insufficient documentation

## 2024-09-11 ENCOUNTER — Ambulatory Visit: Payer: Self-pay | Admitting: Gastroenterology

## 2024-09-21 DIAGNOSIS — G894 Chronic pain syndrome: Secondary | ICD-10-CM | POA: Diagnosis not present

## 2024-09-21 DIAGNOSIS — M542 Cervicalgia: Secondary | ICD-10-CM | POA: Diagnosis not present

## 2024-09-21 DIAGNOSIS — M5451 Vertebrogenic low back pain: Secondary | ICD-10-CM | POA: Diagnosis not present

## 2024-09-21 DIAGNOSIS — M25569 Pain in unspecified knee: Secondary | ICD-10-CM | POA: Diagnosis not present

## 2024-10-03 ENCOUNTER — Inpatient Hospital Stay: Attending: Oncology

## 2024-10-03 DIAGNOSIS — D509 Iron deficiency anemia, unspecified: Secondary | ICD-10-CM | POA: Insufficient documentation

## 2024-10-03 DIAGNOSIS — D500 Iron deficiency anemia secondary to blood loss (chronic): Secondary | ICD-10-CM

## 2024-10-03 LAB — CBC WITH DIFFERENTIAL (CANCER CENTER ONLY)
Abs Immature Granulocytes: 0.01 K/uL (ref 0.00–0.07)
Basophils Absolute: 0 K/uL (ref 0.0–0.1)
Basophils Relative: 1 %
Eosinophils Absolute: 0.1 K/uL (ref 0.0–0.5)
Eosinophils Relative: 3 %
HCT: 38.5 % (ref 36.0–46.0)
Hemoglobin: 13.3 g/dL (ref 12.0–15.0)
Immature Granulocytes: 0 %
Lymphocytes Relative: 28 %
Lymphs Abs: 0.9 K/uL (ref 0.7–4.0)
MCH: 30.9 pg (ref 26.0–34.0)
MCHC: 34.5 g/dL (ref 30.0–36.0)
MCV: 89.5 fL (ref 80.0–100.0)
Monocytes Absolute: 0.2 K/uL (ref 0.1–1.0)
Monocytes Relative: 7 %
Neutro Abs: 1.9 K/uL (ref 1.7–7.7)
Neutrophils Relative %: 61 %
Platelet Count: 101 K/uL — ABNORMAL LOW (ref 150–400)
RBC: 4.3 MIL/uL (ref 3.87–5.11)
RDW: 12.7 % (ref 11.5–15.5)
WBC Count: 3.1 K/uL — ABNORMAL LOW (ref 4.0–10.5)
nRBC: 0 % (ref 0.0–0.2)

## 2024-10-03 LAB — SAMPLE TO BLOOD BANK

## 2024-10-04 ENCOUNTER — Inpatient Hospital Stay

## 2024-10-04 ENCOUNTER — Encounter: Payer: Self-pay | Admitting: Gastroenterology

## 2024-10-05 ENCOUNTER — Ambulatory Visit
Admission: RE | Admit: 2024-10-05 | Discharge: 2024-10-05 | Disposition: A | Discharge status: Home / Self Care | Attending: Gastroenterology | Admitting: Gastroenterology

## 2024-10-05 ENCOUNTER — Other Ambulatory Visit: Payer: Self-pay

## 2024-10-05 ENCOUNTER — Encounter: Admission: RE | Disposition: A | Payer: Self-pay | Source: Home / Self Care | Attending: Gastroenterology

## 2024-10-05 ENCOUNTER — Ambulatory Visit

## 2024-10-05 ENCOUNTER — Encounter: Payer: Self-pay | Admitting: Gastroenterology

## 2024-10-05 DIAGNOSIS — K648 Other hemorrhoids: Secondary | ICD-10-CM | POA: Insufficient documentation

## 2024-10-05 DIAGNOSIS — K644 Residual hemorrhoidal skin tags: Secondary | ICD-10-CM | POA: Insufficient documentation

## 2024-10-05 DIAGNOSIS — K635 Polyp of colon: Secondary | ICD-10-CM | POA: Diagnosis not present

## 2024-10-05 DIAGNOSIS — E039 Hypothyroidism, unspecified: Secondary | ICD-10-CM | POA: Diagnosis not present

## 2024-10-05 DIAGNOSIS — I10 Essential (primary) hypertension: Secondary | ICD-10-CM | POA: Diagnosis not present

## 2024-10-05 DIAGNOSIS — F32A Depression, unspecified: Secondary | ICD-10-CM | POA: Diagnosis not present

## 2024-10-05 DIAGNOSIS — F419 Anxiety disorder, unspecified: Secondary | ICD-10-CM | POA: Insufficient documentation

## 2024-10-05 DIAGNOSIS — I8500 Esophageal varices without bleeding: Secondary | ICD-10-CM | POA: Diagnosis not present

## 2024-10-05 DIAGNOSIS — I8510 Secondary esophageal varices without bleeding: Secondary | ICD-10-CM | POA: Diagnosis not present

## 2024-10-05 DIAGNOSIS — Z1211 Encounter for screening for malignant neoplasm of colon: Secondary | ICD-10-CM | POA: Diagnosis not present

## 2024-10-05 DIAGNOSIS — F418 Other specified anxiety disorders: Secondary | ICD-10-CM | POA: Diagnosis not present

## 2024-10-05 DIAGNOSIS — D125 Benign neoplasm of sigmoid colon: Secondary | ICD-10-CM | POA: Insufficient documentation

## 2024-10-05 DIAGNOSIS — K317 Polyp of stomach and duodenum: Secondary | ICD-10-CM | POA: Insufficient documentation

## 2024-10-05 DIAGNOSIS — I2510 Atherosclerotic heart disease of native coronary artery without angina pectoris: Secondary | ICD-10-CM | POA: Insufficient documentation

## 2024-10-05 DIAGNOSIS — Z860100 Personal history of colon polyps, unspecified: Secondary | ICD-10-CM | POA: Diagnosis not present

## 2024-10-05 DIAGNOSIS — K623 Rectal prolapse: Secondary | ICD-10-CM | POA: Insufficient documentation

## 2024-10-05 HISTORY — PX: COLONOSCOPY: SHX5424

## 2024-10-05 HISTORY — DX: Other cervical disc degeneration, unspecified cervical region: M50.30

## 2024-10-05 HISTORY — DX: Depression, unspecified: F32.A

## 2024-10-05 HISTORY — DX: Sleep apnea, unspecified: G47.30

## 2024-10-05 HISTORY — DX: Essential (primary) hypertension: I10

## 2024-10-05 HISTORY — PX: ESOPHAGOGASTRODUODENOSCOPY: SHX5428

## 2024-10-05 HISTORY — DX: Unspecified cataract: H26.9

## 2024-10-05 HISTORY — PX: POLYPECTOMY: SHX149

## 2024-10-05 SURGERY — COLONOSCOPY
Anesthesia: General

## 2024-10-05 MED ORDER — PHENYLEPHRINE 80 MCG/ML (10ML) SYRINGE FOR IV PUSH (FOR BLOOD PRESSURE SUPPORT)
PREFILLED_SYRINGE | INTRAVENOUS | Status: AC
  Filled 2024-10-05: qty 10

## 2024-10-05 MED ORDER — PROPOFOL 500 MG/50ML IV EMUL
INTRAVENOUS | Status: DC | PRN
  Administered 2024-10-05: 150 ug/kg/min via INTRAVENOUS

## 2024-10-05 MED ORDER — LIDOCAINE HCL (CARDIAC) PF 100 MG/5ML IV SOSY
PREFILLED_SYRINGE | INTRAVENOUS | Status: DC | PRN
  Administered 2024-10-05: 100 mg via INTRAVENOUS

## 2024-10-05 MED ORDER — LIDOCAINE HCL (PF) 2 % IJ SOLN
INTRAMUSCULAR | Status: AC
  Filled 2024-10-05: qty 5

## 2024-10-05 MED ORDER — PROPOFOL 1000 MG/100ML IV EMUL
INTRAVENOUS | Status: AC
  Filled 2024-10-05: qty 100

## 2024-10-05 MED ORDER — PHENYLEPHRINE 80 MCG/ML (10ML) SYRINGE FOR IV PUSH (FOR BLOOD PRESSURE SUPPORT)
PREFILLED_SYRINGE | INTRAVENOUS | Status: DC | PRN
  Administered 2024-10-05: 80 ug via INTRAVENOUS

## 2024-10-05 MED ORDER — SODIUM CHLORIDE 0.9 % IV SOLN
INTRAVENOUS | Status: DC
Start: 2024-10-05 — End: 2024-10-05

## 2024-10-05 MED ORDER — GLYCOPYRROLATE 0.2 MG/ML IJ SOLN
INTRAMUSCULAR | Status: DC | PRN
  Administered 2024-10-05: .2 mg via INTRAVENOUS

## 2024-10-05 MED ORDER — PROPOFOL 10 MG/ML IV BOLUS
INTRAVENOUS | Status: DC | PRN
  Administered 2024-10-05: 150 mg via INTRAVENOUS
  Administered 2024-10-05 (×3): 50 mg via INTRAVENOUS

## 2024-10-05 NOTE — H&P (Signed)
 Brandi JONELLE Brooklyn, MD [facility], DHIP [address]  [place], [place] [address]  Main: [phone] Fax:  [phone] Pager: [phone]   Primary Care Physician:  Brandi App, NP Primary Gastroenterologist:  Dr. Corinn JONELLE Bright  Pre-Procedure History & Physical: HPI:  Brandi Bright is a 53 y.o. female is here for an endoscopy and colonoscopy.   Past Medical History:  Diagnosis Date   Arthritis    B12 deficiency 07/20/2023   Back pain    Cataract    Chronic knee pain    Cirrhosis (HCC)    DDD (degenerative disc disease), cervical    Depression    Dysphagia    Dyspnea    GI bleed 06/24/2019   Gout    Heart attack (HCC)    History of blood transfusion    HLD (hyperlipidemia)    Hypertension    Hypothyroidism    IDA (iron  deficiency anemia) 01/16/2020   Iron  deficiency anemia due to chronic blood loss 01/16/2020   Leukopenia 09/23/2018   MI, old    Oropharyngeal dysphagia 04/07/2021   Pleurisy 01/13/2022   Sinusitis 04/07/2021   Sleep apnea    Stomach irritation    Thyroid  disease     Past Surgical History:  Procedure Laterality Date   CARDIAC CATHETERIZATION     CESAREAN SECTION     COLONOSCOPY WITH PROPOFOL  N/A 03/16/2018   Procedure: COLONOSCOPY WITH PROPOFOL ;  Surgeon: Brandi Keene NOVAK, MD;  Location: [facility] ENDOSCOPY;  Service: Endoscopy;  Laterality: N/A;   COLONOSCOPY WITH PROPOFOL  N/A 07/09/2021   Procedure: COLONOSCOPY WITH PROPOFOL ;  Surgeon: Brandi Keene NOVAK, MD;  Location: [facility] ENDOSCOPY;  Service: Endoscopy;  Laterality: N/A;   ESOPHAGOGASTRODUODENOSCOPY N/A 10/11/2019   Procedure: ESOPHAGOGASTRODUODENOSCOPY (EGD);  Surgeon: Bright Brandi Skiff, MD;  Location: [facility] ENDOSCOPY;  Service: Gastroenterology;  Laterality: N/A;   ESOPHAGOGASTRODUODENOSCOPY (EGD) WITH PROPOFOL  N/A 10/27/2018   Procedure: ESOPHAGOGASTRODUODENOSCOPY (EGD) WITH PROPOFOL ;  Surgeon: Brandi Keene NOVAK, MD;  Location: [facility] ENDOSCOPY;   Service: Endoscopy;  Laterality: N/A;   ESOPHAGOGASTRODUODENOSCOPY (EGD) WITH PROPOFOL  N/A 06/25/2019   Procedure: ESOPHAGOGASTRODUODENOSCOPY (EGD) WITH PROPOFOL ;  Surgeon: Brandi Bi, MD;  Location: [facility] ENDOSCOPY;  Service: Gastroenterology;  Laterality: N/A;   ESOPHAGOGASTRODUODENOSCOPY (EGD) WITH PROPOFOL  N/A 09/12/2019   Procedure: ESOPHAGOGASTRODUODENOSCOPY (EGD) WITH PROPOFOL ;  Surgeon: Brandi Carmine, MD;  Location: [facility] ENDOSCOPY;  Service: Endoscopy;  Laterality: N/A;   ESOPHAGOGASTRODUODENOSCOPY (EGD) WITH PROPOFOL  N/A 11/20/2019   Procedure: ESOPHAGOGASTRODUODENOSCOPY (EGD) WITH PROPOFOL ;  Surgeon: Brandi Keene NOVAK, MD;  Location: [facility] ENDOSCOPY;  Service: Endoscopy;  Laterality: N/A;   ESOPHAGOGASTRODUODENOSCOPY (EGD) WITH PROPOFOL  N/A 11/23/2019   Procedure: ESOPHAGOGASTRODUODENOSCOPY (EGD) WITH PROPOFOL ;  Surgeon: Brandi Keene NOVAK, MD;  Location: [facility] ENDOSCOPY;  Service: Endoscopy;  Laterality: N/A;   ESOPHAGOGASTRODUODENOSCOPY (EGD) WITH PROPOFOL  N/A 01/03/2020   Procedure: ESOPHAGOGASTRODUODENOSCOPY (EGD) WITH PROPOFOL ;  Surgeon: Bright Brandi Skiff, MD;  Location: [facility] ENDOSCOPY;  Service: Endoscopy;  Laterality: N/A;   ESOPHAGOGASTRODUODENOSCOPY (EGD) WITH PROPOFOL  N/A 07/09/2021   Procedure: ESOPHAGOGASTRODUODENOSCOPY (EGD) WITH PROPOFOL ;  Surgeon: Brandi Keene NOVAK, MD;  Location: [facility] ENDOSCOPY;  Service: Endoscopy;  Laterality: N/A;   ESOPHAGOGASTRODUODENOSCOPY (EGD) WITH PROPOFOL  N/A 05/17/2023   Procedure: ESOPHAGOGASTRODUODENOSCOPY (EGD) WITH PROPOFOL ;  Surgeon: Bright Brandi Skiff, MD;  Location: [facility] ENDOSCOPY;  Service: Gastroenterology;  Laterality: N/A;   ESOPHAGOGASTRODUODENOSCOPY (EGD) WITH PROPOFOL  N/A 09/27/2023   Procedure: ESOPHAGOGASTRODUODENOSCOPY (EGD) WITH PROPOFOL ;  Surgeon: Bright Brandi Skiff, MD;  Location: [facility] ENDOSCOPY;  Service: Gastroenterology;  Laterality: N/A;   ESOPHAGOGASTRODUODENOSCOPY (EGD) WITH  PROPOFOL  N/A 12/02/2023   Procedure:  ESOPHAGOGASTRODUODENOSCOPY (EGD) WITH PROPOFOL ;  Surgeon: Unk Brandi Skiff, MD;  Location: [facility] ENDOSCOPY;  Service: Gastroenterology;  Laterality: N/A;   ESOPHAGOGASTRODUODENOSCOPY (EGD) WITH PROPOFOL  N/A 12/30/2023   Procedure: ESOPHAGOGASTRODUODENOSCOPY (EGD) WITH PROPOFOL ;  Surgeon: Unk Brandi Skiff, MD;  Location: [facility] ENDOSCOPY;  Service: Gastroenterology;  Laterality: N/A;   HEMOSTASIS CLIP PLACEMENT  09/27/2023   Procedure: HEMOSTASIS CLIP PLACEMENT;  Surgeon: Unk Brandi Skiff, MD;  Location: [facility] ENDOSCOPY;  Service: Gastroenterology;;   KNEE ARTHROSCOPY     POLYPECTOMY  09/27/2023   Procedure: POLYPECTOMY;  Surgeon: Unk Brandi Skiff, MD;  Location: [facility] ENDOSCOPY;  Service: Gastroenterology;;   TUBAL LIGATION      Prior to Admission medications   Medication Sig Start Date End Date Taking? Authorizing Provider  carvedilol  (COREG ) 3.125 MG tablet Take 3.125 mg by mouth 2 (two) times daily with a meal.   Yes [provider]  furosemide  (LASIX ) 20 MG tablet TAKE 1 TABLET BY MOUTH EVERY DAY 02/02/24  Yes Geovonni Meyerhoff Reddy, MD  levothyroxine  (SYNTHROID ) 75 MCG tablet TAKE 1 TABLET BY MOUTH DAILY BEFORE BREAKFAST. 07/24/24  Yes Brandi App, NP  omeprazole  (PRILOSEC) 40 MG capsule TAKE 1 CAPSULE (40 MG TOTAL) BY MOUTH DAILY. 12/31/23  Yes Marwin Primmer, Brandi Skiff, MD  oxyCODONE  (OXYCONTIN ) 15 mg 12 hr tablet 15 mg every 12 (twelve) hours. 06/09/22  Yes [provider]  Oxycodone  HCl 10 MG TABS Take 10 mg by mouth 4 (four) times daily as needed. 10/14/20  Yes [provider]  pantoprazole  (PROTONIX ) 40 MG tablet Take 40 mg by mouth daily.   Yes [provider]  semaglutide -weight management (WEGOVY ) 2.4 MG/0.75ML SOAJ SQ injection Inject 2.4 mg into the skin.   Yes [provider]  avatrombopag maleate  (DOPTELET ) 20 MG tablet Take 3 tablets (60 mg total) by mouth daily. Take for 5 days. Take with food. Beginning 10 to 13 days prior to scheduled  procedure. 09/01/24   Babara Call, MD  Biotin 89999 MCG TABS Take by mouth. Patient not taking: Reported on 10/05/2024    [provider]  Cyanocobalamin  (B-12) 2500 MCG SUBL Place 1 tablet under the tongue daily. 02/09/24   Babara Call, MD  ferrous sulfate  324 MG TBEC Take 324 mg by mouth. Patient not taking: Reported on 10/05/2024    [provider]  furosemide  (LASIX ) 40 MG tablet TAKE 1 TABLET BY MOUTH EVERY DAY Patient not taking: No sig reported 02/24/24   Unk Brandi Skiff, MD  ibuprofen  (ADVIL ) 800 MG tablet Take 800 mg by mouth every 8 (eight) hours as needed. Patient not taking: Reported on 10/05/2024    [provider]  loratadine (CLARITIN) 10 MG tablet Take 10 mg by mouth daily. Patient not taking: Reported on 10/05/2024    [provider]  nitroGLYCERIN  (NITROLINGUAL ) 0.4 MG/SPRAY spray Place 1 spray under the tongue every 5 (five) minutes x 3 doses as needed for chest pain. Patient not taking: Reported on 10/05/2024 04/24/20   Britta King, MD  rOPINIRole  (REQUIP ) 2 MG tablet Take 2 mg by mouth at bedtime. Patient not taking: Reported on 10/05/2024    [provider]  Semaglutide -Weight Management 2.4 MG/0.75ML SOAJ Inject 2.4 mg into the skin once a week. 07/18/24   Brandi App, NP  spironolactone  (ALDACTONE ) 25 MG tablet Take 1 tablet (25 mg total) by mouth daily. Patient not taking: No sig reported 02/02/24   Unk Brandi Skiff, MD  spironolactone  (ALDACTONE ) 50 MG tablet  Take 1 tablet (50 mg total) by mouth daily. Patient not taking: No sig reported 02/02/24   Unk Brandi Skiff, MD  traZODone  (DESYREL ) 50 MG tablet Take 25 mg by mouth at bedtime. Patient not taking: Reported on 10/05/2024    [provider]  ZTLIDO  1.8 % PTCH Apply 1 patch topically at bedtime. 07/10/22   [provider]    Allergies as of 09/02/2024 - Review Complete 08/09/2024  Allergen Reaction Noted   Vicodin [hydrocodone-acetaminophen ] Rash  01/11/2016   Amoxicillin Other (See Comments) and Rash 01/11/2016   Gabapentin Rash 01/11/2016    Family History  Problem Relation Age of Onset   Stroke Mother    Hypertension Mother    Hyperlipidemia Mother    Diabetes Mother    COPD Mother    Cancer Mother    Arthritis Mother    Hypertension Father    COPD Father    Cancer Father    CAD Father    Colon cancer Father    Lung cancer Father    Heart attack Father    Stroke Sister    Kidney disease Sister    Hypertension Sister    Hyperlipidemia Sister    Diabetes Sister    COPD Sister    Arthritis Sister    Hyperlipidemia Sister    Arthritis Sister    Stroke Sister    Hypertension Sister    Hyperlipidemia Sister    Heart disease Sister    Heart attack Sister    Diabetes Sister    COPD Sister    Asthma Sister    Arthritis Sister    Breast cancer Maternal Aunt 71 - 59   Arthritis Maternal Grandmother    Arthritis Maternal Grandfather    Arthritis Paternal Grandmother    Arthritis Paternal Grandfather    Breast cancer Cousin 55 - 28   Stroke Brother    Hyperlipidemia Brother    COPD Brother    Arthritis Brother    Pancreatic cancer Brother    Hypertension Other     Social History   Socioeconomic History   Marital status: Single    Spouse name: Not on file   Number of children: Not on file   Years of education: Not on file   Highest education level: Not on file  Occupational History   Not on file  Tobacco Use   Smoking status: Never   Smokeless tobacco: Never  Vaping Use   Vaping status: Never Used  Substance and Sexual Activity   Alcohol use: Yes    Comment: occ   Drug use: Yes    Comment: prescribed oxycodone  and oxycotin   Sexual activity: Not Currently  Other Topics Concern   Not on file  Social History Narrative   Not on file   Social Drivers of Health   Financial Resource Strain: Medium Risk (10/11/2019)   Overall Financial Resource Strain (CARDIA)    Difficulty of Paying Living  Expenses: Somewhat hard  Food Insecurity: No Food Insecurity (04/01/2023)   Hunger Vital Sign    Worried About Running Out of Food in the Last Year: Never true    Ran Out of Food in the Last Year: Never true  Transportation Needs: No Transportation Needs (04/01/2023)   PRAPARE - Administrator, Civil Service (Medical): No    Lack of Transportation (Non-Medical): No  Physical Activity: Inactive (10/11/2019)   Exercise Vital Sign    Days of Exercise per Week: 0 days  Minutes of Exercise per Session: 0 min  Stress: Stress Concern Present (10/11/2019)   [employer] of Occupational Health - Occupational Stress Questionnaire    Feeling of Stress : Rather much  Social Connections: Unknown (10/11/2019)   Social Connection and Isolation Panel    Frequency of Communication with Friends and Family: More than three times a week    Frequency of Social Gatherings with Friends and Family: More than three times a week    Attends Religious Services: Never    Database administrator or Organizations: No    Attends Engineer, structural: Not asked    Marital Status: Not on file  Intimate Partner Violence: Not At Risk (04/01/2023)   Humiliation, Afraid, Rape, and Kick questionnaire    Fear of Current or Ex-Partner: No    Emotionally Abused: No    Physically Abused: No    Sexually Abused: No    Review of Systems: See HPI, otherwise negative ROS  Physical Exam: BP (!) 132/96   Pulse 75   Temp (!) 96.8 F (36 C) (Temporal)   Resp (!) 97   Ht 4' 9 (1.448 m)   Wt 80.1 kg   LMP  (LMP Unknown) Comment: last menstrual 9 years ago, tubal ligation  SpO2 97%   BMI 38.22 kg/m  General:   Alert,  pleasant and cooperative in NAD Head:  Normocephalic and atraumatic. Neck:  Supple; no masses or thyromegaly. Lungs:  Clear throughout to auscultation.    Heart:  Regular rate and rhythm. Abdomen:  Soft, nontender and nondistended. Normal bowel sounds, without guarding, and without  rebound.   Neurologic:  Alert and  oriented x4;  grossly normal neurologically.  Impression/Plan: Brandi Bright is here for an endoscopy and colonoscopy to be performed for variceal surveillance, Personal history of adenomatous colon polyps   Risks, benefits, limitations, and alternatives regarding  endoscopy and colonoscopy have been reviewed with the patient.  Questions have been answered.  All parties agreeable.   Brandi Brooklyn, MD  10/05/2024, 8:02 AM

## 2024-10-05 NOTE — Anesthesia Preprocedure Evaluation (Signed)
 Anesthesia Evaluation  Patient identified by MRN, date of birth, ID band Patient awake    Reviewed: Allergy & Precautions, H&P , NPO status , Patient's Chart, lab work & pertinent test results  Airway Mallampati: II  TM Distance: >3 FB Neck ROM: full    Dental no notable dental hx. (+) Missing   Pulmonary shortness of breath and with exertion   Pulmonary exam normal        Cardiovascular Exercise Tolerance: Poor hypertension, (-) angina + CAD, + Past MI and + DOE  Normal cardiovascular exam     Neuro/Psych  PSYCHIATRIC DISORDERS Anxiety Depression    negative neurological ROS     GI/Hepatic ,GERD  Controlled and Medicated,,(+) Cirrhosis         Endo/Other  Hypothyroidism    Renal/GU      Musculoskeletal   Abdominal   Peds  Hematology negative hematology ROS (+)   Anesthesia Other Findings Past Medical History: No date: Arthritis 07/20/2023: B12 deficiency No date: Back pain No date: Chronic knee pain No date: Cirrhosis (HCC) No date: Dysphagia No date: Dyspnea 06/24/2019: GI bleed No date: Gout No date: Heart attack (HCC) No date: History of blood transfusion No date: HLD (hyperlipidemia) No date: Hypothyroidism 01/16/2020: IDA (iron  deficiency anemia) 01/16/2020: Iron  deficiency anemia due to chronic blood loss 09/23/2018: Leukopenia No date: MI, old 04/07/2021: Oropharyngeal dysphagia 01/13/2022: Pleurisy 04/07/2021: Sinusitis No date: Stomach irritation No date: Thyroid  disease  Past Surgical History: No date: CARDIAC CATHETERIZATION No date: CESAREAN SECTION 03/16/2018: COLONOSCOPY WITH PROPOFOL ; N/A     Comment:  Procedure: COLONOSCOPY WITH PROPOFOL ;  Surgeon:               Janalyn Keene NOVAK, MD;  Location: [facility] ENDOSCOPY;                Service: Endoscopy;  Laterality: N/A; 07/09/2021: COLONOSCOPY WITH PROPOFOL ; N/A     Comment:  Procedure: COLONOSCOPY WITH PROPOFOL ;  Surgeon:                Janalyn Keene NOVAK, MD;  Location: [facility] ENDOSCOPY;                Service: Endoscopy;  Laterality: N/A; 10/11/2019: ESOPHAGOGASTRODUODENOSCOPY; N/A     Comment:  Procedure: ESOPHAGOGASTRODUODENOSCOPY (EGD);  Surgeon:               Unk Corinn Skiff, MD;  Location: [facility] ENDOSCOPY;                Service: Gastroenterology;  Laterality: N/A; 10/27/2018: ESOPHAGOGASTRODUODENOSCOPY (EGD) WITH PROPOFOL ; N/A     Comment:  Procedure: ESOPHAGOGASTRODUODENOSCOPY (EGD) WITH               PROPOFOL ;  Surgeon: Janalyn Keene NOVAK, MD;  Location:               [facility] ENDOSCOPY;  Service: Endoscopy;  Laterality: N/A; 06/25/2019: ESOPHAGOGASTRODUODENOSCOPY (EGD) WITH PROPOFOL ; N/A     Comment:  Procedure: ESOPHAGOGASTRODUODENOSCOPY (EGD) WITH               PROPOFOL ;  Surgeon: Therisa Bi, MD;  Location: [facility]               ENDOSCOPY;  Service: Gastroenterology;  Laterality: N/A; 09/12/2019: ESOPHAGOGASTRODUODENOSCOPY (EGD) WITH PROPOFOL ; N/A     Comment:  Procedure: ESOPHAGOGASTRODUODENOSCOPY (EGD) WITH               PROPOFOL ;  Surgeon: Jinny Carmine, MD;  Location: [facility]  ENDOSCOPY;  Service: Endoscopy;  Laterality: N/A; 11/20/2019: ESOPHAGOGASTRODUODENOSCOPY (EGD) WITH PROPOFOL ; N/A     Comment:  Procedure: ESOPHAGOGASTRODUODENOSCOPY (EGD) WITH               PROPOFOL ;  Surgeon: Janalyn Keene NOVAK, MD;  Location:               [facility] ENDOSCOPY;  Service: Endoscopy;  Laterality: N/A; 11/23/2019: ESOPHAGOGASTRODUODENOSCOPY (EGD) WITH PROPOFOL ; N/A     Comment:  Procedure: ESOPHAGOGASTRODUODENOSCOPY (EGD) WITH               PROPOFOL ;  Surgeon: Janalyn Keene NOVAK, MD;  Location:               [facility] ENDOSCOPY;  Service: Endoscopy;  Laterality: N/A; 01/03/2020: ESOPHAGOGASTRODUODENOSCOPY (EGD) WITH PROPOFOL ; N/A     Comment:  Procedure: ESOPHAGOGASTRODUODENOSCOPY (EGD) WITH               PROPOFOL ;  Surgeon: Unk Corinn Skiff, MD;  Location:               [facility] ENDOSCOPY;  Service: Endoscopy;   Laterality: N/A; 07/09/2021: ESOPHAGOGASTRODUODENOSCOPY (EGD) WITH PROPOFOL ; N/A     Comment:  Procedure: ESOPHAGOGASTRODUODENOSCOPY (EGD) WITH               PROPOFOL ;  Surgeon: Janalyn Keene NOVAK, MD;  Location:               [facility] ENDOSCOPY;  Service: Endoscopy;  Laterality: N/A; 05/17/2023: ESOPHAGOGASTRODUODENOSCOPY (EGD) WITH PROPOFOL ; N/A     Comment:  Procedure: ESOPHAGOGASTRODUODENOSCOPY (EGD) WITH               PROPOFOL ;  Surgeon: Unk Corinn Skiff, MD;  Location:               [facility] ENDOSCOPY;  Service: Gastroenterology;  Laterality:               N/A; 09/27/2023: ESOPHAGOGASTRODUODENOSCOPY (EGD) WITH PROPOFOL ; N/A     Comment:  Procedure: ESOPHAGOGASTRODUODENOSCOPY (EGD) WITH               PROPOFOL ;  Surgeon: Unk Corinn Skiff, MD;  Location:               [facility] ENDOSCOPY;  Service: Gastroenterology;  Laterality:               N/A; 12/02/2023: ESOPHAGOGASTRODUODENOSCOPY (EGD) WITH PROPOFOL ; N/A     Comment:  Procedure: ESOPHAGOGASTRODUODENOSCOPY (EGD) WITH               PROPOFOL ;  Surgeon: Unk Corinn Skiff, MD;  Location:               [facility] ENDOSCOPY;  Service: Gastroenterology;  Laterality:               N/A; 09/27/2023: HEMOSTASIS CLIP PLACEMENT     Comment:  Procedure: HEMOSTASIS CLIP PLACEMENT;  Surgeon: Unk Corinn Skiff, MD;  Location: [facility] ENDOSCOPY;  Service:               Gastroenterology;; 09/27/2023: POLYPECTOMY     Comment:  Procedure: POLYPECTOMY;  Surgeon: Unk Corinn Skiff,               MD;  Location: [facility] ENDOSCOPY;  Service:               Gastroenterology;; No date: TUBAL LIGATION  BMI    Body Mass Index: 44.79 kg/m  Reproductive/Obstetrics negative OB ROS                              Anesthesia Physical Anesthesia Plan  ASA: 3  Anesthesia Plan: General   Post-op Pain Management: Minimal or no pain anticipated   Induction: Intravenous  PONV Risk Score and Plan: 2 and Propofol  infusion and  TIVA  Airway Management Planned: Nasal Cannula  Additional Equipment: None  Intra-op Plan:   Post-operative Plan:   Informed Consent: I have reviewed the patients History and Physical, chart, labs and discussed the procedure including the risks, benefits and alternatives for the proposed anesthesia with the patient or authorized representative who has indicated his/her understanding and acceptance.     Dental advisory given  Plan Discussed with: CRNA and Surgeon  Anesthesia Plan Comments: (Discussed risks of anesthesia with patient, including possibility of difficulty with spontaneous ventilation under anesthesia necessitating airway intervention, PONV, and rare risks such as cardiac or respiratory or neurological events, and allergic reactions. Discussed the role of CRNA in patient's perioperative care. Patient understands.)        Anesthesia Quick Evaluation

## 2024-10-05 NOTE — Transfer of Care (Signed)
 Immediate Anesthesia Transfer of Care Note  Patient: Brandi Bright  Procedure(s) Performed: COLONOSCOPY EGD (ESOPHAGOGASTRODUODENOSCOPY) POLYPECTOMY, INTESTINE  Patient Location: Endoscopy Unit  Anesthesia Type:General  Level of Consciousness: awake and drowsy  Airway & Oxygen Therapy: Patient Spontanous Breathing  Post-op Assessment: Report given to RN and Post -op Vital signs reviewed and stable  Post vital signs: Reviewed and stable  Last Vitals:  Vitals Value Taken Time  BP 88/53 10/05/24 08:43  Temp 35.9 C 10/05/24 08:43  Pulse 87 10/05/24 08:43  Resp 9 10/05/24 08:43  SpO2 96 % 10/05/24 08:43  Vitals shown include unfiled device data.  Last Pain:  Vitals:   10/05/24 0843  TempSrc: Temporal  PainSc:          Complications: No notable events documented.

## 2024-10-05 NOTE — Anesthesia Postprocedure Evaluation (Signed)
 Anesthesia Post Note  Patient: Brandi Bright  Procedure(s) Performed: COLONOSCOPY EGD (ESOPHAGOGASTRODUODENOSCOPY) POLYPECTOMY, INTESTINE  Patient location during evaluation: Endoscopy Anesthesia Type: General Level of consciousness: awake and alert Pain management: pain level controlled Vital Signs Assessment: post-procedure vital signs reviewed and stable Respiratory status: spontaneous breathing, nonlabored ventilation, respiratory function stable and patient connected to nasal cannula oxygen Cardiovascular status: blood pressure returned to baseline and stable Postop Assessment: no apparent nausea or vomiting Anesthetic complications: no   No notable events documented.   Last Vitals:  Vitals:   10/05/24 0854 10/05/24 0903  BP: 103/71 117/75  Pulse: 81 74  Resp: 20 20  Temp:    SpO2: 99% 100%    Last Pain:  Vitals:   10/05/24 0903  TempSrc:   PainSc: 0-No pain                 Debby Mines

## 2024-10-05 NOTE — Op Note (Signed)
 [facility] Gastroenterology Patient Name: Brandi Bright Procedure Date: 10/05/2024 7:59 AM MRN: [id number] Account #: [id number] Date of Birth: [birth year] Admit Type: Outpatient Age: 53 Room: [facility] ENDO ROOM 3 Gender: Female Note Status: Finalized Instrument Name: Colon Scope [id number] Procedure:             Colonoscopy Indications:           Surveillance: Personal history of adenomatous polyps                         on last colonoscopy 3 years ago, Last colonoscopy:                         July 2022 Providers:             Corinn Jess Brooklyn MD, MD Referring MD:          Leron Glance (Referring MD) Medicines:             General Anesthesia Complications:         No immediate complications. Procedure:             Pre-Anesthesia Assessment:                        - Prior to the procedure, a History and Physical was                         performed, and patient medications and allergies were                         reviewed. The patient is competent. The risks and                         benefits of the procedure and the sedation options and                         risks were discussed with the patient. All questions                         were answered and informed consent was obtained.                         Patient identification and proposed procedure were                         verified by the physician, the nurse, the                         anesthesiologist, the anesthetist and the technician                         in the pre-procedure area in the procedure room in the                         endoscopy suite. Mental Status Examination: alert and                         oriented. Airway Examination: normal oropharyngeal  airway and neck mobility. Respiratory Examination:                         clear to auscultation. CV Examination: normal.                         Prophylactic Antibiotics: The patient does not require                          prophylactic antibiotics. Prior Anticoagulants: The                         patient has taken no anticoagulant or antiplatelet                         agents. ASA Grade Assessment: III - A patient with                         severe systemic disease. After reviewing the risks and                         benefits, the patient was deemed in satisfactory                         condition to undergo the procedure. The anesthesia                         plan was to use general anesthesia. Immediately prior                         to administration of medications, the patient was                         re-assessed for adequacy to receive sedatives. The                         heart rate, respiratory rate, oxygen saturations,                         blood pressure, adequacy of pulmonary ventilation, and                         response to care were monitored throughout the                         procedure. The physical status of the patient was                         re-assessed after the procedure.                        After obtaining informed consent, the colonoscope was                         passed under direct vision. Throughout the procedure,                         the patient's blood pressure, pulse, and oxygen  saturations were monitored continuously. The                         Colonoscope was introduced through the anus and                         advanced to the the terminal ileum, with                         identification of the appendiceal orifice and IC                         valve. The colonoscopy was performed with moderate                         difficulty due to significant looping and the                         patient's body habitus. Successful completion of the                         procedure was aided by applying abdominal pressure.                         The patient tolerated the procedure well. The quality                          of the bowel preparation was evaluated using the BBPS                         [facility] Bowel Preparation Scale) with scores of: Right                         Colon = 3, Transverse Colon = 3 and Left Colon = 3                         (entire mucosa seen well with no residual staining,                         small fragments of stool or opaque liquid). The total                         BBPS score equals 9. The terminal ileum, ileocecal                         valve, appendiceal orifice, and rectum were                         photographed. Findings:      A 4 mm polyp was found in the sigmoid colon. The polyp was sessile. The       polyp was removed with a cold snare. Resection and retrieval were       complete. Estimated blood loss: none.      Non-bleeding external and internal hemorrhoids were found during       retroflexion. The hemorrhoids were large.      The perianal exam findings include partial rectal prolapse.      The  terminal ileum appeared normal. Impression:            - One 4 mm polyp in the sigmoid colon, removed with a                         cold snare. Resected and retrieved.                        - Non-bleeding external and internal hemorrhoids.                        - Partial rectal prolapse found on perianal exam.                        - The examined portion of the ileum was normal. Recommendation:        - Discharge patient to home (with escort).                        - Resume previous diet today.                        - Continue present medications.                        - Await pathology results.                        - Repeat colonoscopy in 5 years for surveillance. Procedure Code(s):     --- Professional ---                        [phone], Colonoscopy, flexible; with removal of                         tumor(s), polyp(s), or other lesion(s) by snare                         technique Diagnosis Code(s):     --- Professional ---                        Z86.010,  Personal history of colonic polyps                        D12.5, Benign neoplasm of sigmoid colon                        K64.8, Other hemorrhoids CPT copyright 2022 [employer]. All rights reserved. The codes documented in this report are preliminary and upon coder review may  be revised to meet current compliance requirements. Dr. Corinn Brooklyn Corinn Jess Brooklyn MD, MD 10/05/2024 8:42:48 AM This report has been signed electronically. Number of Addenda: 0 Note Initiated On: 10/05/2024 7:59 AM Scope Withdrawal Time: 0 hours 8 minutes 40 seconds  Total Procedure Duration: 0 hours 14 minutes 20 seconds  Estimated Blood Loss:  Estimated blood loss: none.      [facility]

## 2024-10-05 NOTE — Op Note (Signed)
 [facility] Gastroenterology Patient Name: Brandi Bright Procedure Date: 10/05/2024 8:00 AM MRN: [id number] Account #: [id number] Date of Birth: 07/24/1971 Admit Type: Outpatient Age: 53 Room: [facility] ENDO ROOM 3 Gender: Female Note Status: Finalized Instrument Name: Endoscope [id number] Procedure:             Upper GI endoscopy Indications:           Esophageal varices, Follow-up of esophageal varices Providers:             Corinn Jess Brooklyn MD, MD Referring MD:          Leron Glance (Referring MD) Medicines:             General Anesthesia Complications:         No immediate complications. Estimated blood loss: None. Procedure:             Pre-Anesthesia Assessment:                        - Prior to the procedure, a History and Physical was                         performed, and patient medications and allergies were                         reviewed. The patient is competent. The risks and                         benefits of the procedure and the sedation options and                         risks were discussed with the patient. All questions                         were answered and informed consent was obtained.                         Patient identification and proposed procedure were                         verified by the physician, the nurse, the                         anesthesiologist, the anesthetist and the technician                         in the pre-procedure area in the procedure room in the                         endoscopy suite. Mental Status Examination: alert and                         oriented. Airway Examination: normal oropharyngeal                         airway and neck mobility. Respiratory Examination:                         clear to auscultation. CV Examination: normal.  Prophylactic Antibiotics: The patient does not require                         prophylactic antibiotics. Prior Anticoagulants: The                          patient has taken no anticoagulant or antiplatelet                         agents. ASA Grade Assessment: III - A patient with                         severe systemic disease. After reviewing the risks and                         benefits, the patient was deemed in satisfactory                         condition to undergo the procedure. The anesthesia                         plan was to use general anesthesia. Immediately prior                         to administration of medications, the patient was                         re-assessed for adequacy to receive sedatives. The                         heart rate, respiratory rate, oxygen saturations,                         blood pressure, adequacy of pulmonary ventilation, and                         response to care were monitored throughout the                         procedure. The physical status of the patient was                         re-assessed after the procedure.                        After obtaining informed consent, the endoscope was                         passed under direct vision. Throughout the procedure,                         the patient's blood pressure, pulse, and oxygen                         saturations were monitored continuously. The Endoscope                         was introduced through the mouth, and advanced to the  second part of duodenum. The upper GI endoscopy was                         accomplished without difficulty. The patient tolerated                         the procedure well. Findings:      The duodenal bulb and second portion of the duodenum were normal.      A single 20 mm sessile polyp with no bleeding and no stigmata of recent       bleeding was found in the gastric antrum. The polyp was removed with a       hot snare. Resection and retrieval were complete. To prevent bleeding       after the polypectomy, one hemostatic clip was successfully placed (MR       safe).  Clip manufacturer: [employer]. There was no bleeding       during, or at the end, of the procedure.      Multiple small sessile polyps with no bleeding and no stigmata of recent       bleeding were found in the gastric fundus and in the gastric body.      The cardia and gastric fundus were normal on retroflexion.      Esophagogastric landmarks were identified: the gastroesophageal junction       was found at 35 cm from the incisors.      Two columns of small (< 5 mm) varices with no bleeding and no stigmata       of recent bleeding were found in the lower third of the esophagus, 35 cm       from the incisors. No red wale signs were present. Scarring from prior       treatment was visible. Evidence of partial eradication was visible. The       varices appeared smaller than they were at prior exam. Impression:            - Normal duodenal bulb and second portion of the                         duodenum.                        - A single gastric polyp. Resected and retrieved. Clip                         manufacturer: [employer]. Clip (MR safe) was                         placed.                        - Multiple gastric polyps.                        - Esophagogastric landmarks identified.                        - Small (< 5 mm) esophageal varices with no bleeding                         and no stigmata of recent bleeding. Recommendation:        -  Repeat upper endoscopy in 1 year for surveillance.                        - Proceed with colonoscopy as scheduled                        See colonoscopy report                        - Return to my office as previously scheduled. Procedure Code(s):     --- Professional ---                        [phone], Esophagogastroduodenoscopy, flexible,                         transoral; with removal of tumor(s), polyp(s), or                         other lesion(s) by snare technique Diagnosis Code(s):     --- Professional ---                         I85.00, Esophageal varices without bleeding                        K31.7, Polyp of stomach and duodenum CPT copyright 2022 American [employer] Association. All rights reserved. The codes documented in this report are preliminary and upon coder review may  be revised to meet current compliance requirements. Dr. Corinn Brooklyn Corinn Jess Brooklyn MD, MD 10/05/2024 8:22:05 AM This report has been signed electronically. Number of Addenda: 0 Note Initiated On: 10/05/2024 8:00 AM Estimated Blood Loss:  Estimated blood loss: none.      [facility]

## 2024-10-06 LAB — SURGICAL PATHOLOGY

## 2024-10-11 ENCOUNTER — Ambulatory Visit: Payer: Self-pay | Admitting: Gastroenterology

## 2024-10-21 ENCOUNTER — Other Ambulatory Visit: Payer: Self-pay | Admitting: Nurse Practitioner

## 2024-11-01 ENCOUNTER — Other Ambulatory Visit: Payer: Self-pay | Admitting: Nurse Practitioner

## 2024-11-01 DIAGNOSIS — E039 Hypothyroidism, unspecified: Secondary | ICD-10-CM

## 2024-11-23 DIAGNOSIS — G894 Chronic pain syndrome: Secondary | ICD-10-CM | POA: Diagnosis not present

## 2024-11-23 DIAGNOSIS — M25551 Pain in right hip: Secondary | ICD-10-CM | POA: Diagnosis not present

## 2024-11-23 DIAGNOSIS — M542 Cervicalgia: Secondary | ICD-10-CM | POA: Diagnosis not present

## 2024-11-23 DIAGNOSIS — M5451 Vertebrogenic low back pain: Secondary | ICD-10-CM | POA: Diagnosis not present

## 2025-01-11 ENCOUNTER — Telehealth: Payer: Self-pay

## 2025-01-11 NOTE — Progress Notes (Signed)
 Complex Care Management Note Care Guide Note  01/11/2025 Name: Brandi Bright MRN: [id number] DOB: 03/20/1971   Complex Care Management Outreach Attempts: An unsuccessful telephone outreach was attempted today to offer the patient information about available complex care management services.  Follow Up Plan:  Additional outreach attempts will be made to offer the patient complex care management information and services.   Encounter Outcome:  No Answer    Jon Colt [facility]  [facility] Guide, Phone: [phone] Fax: [phone] Website: [email or web address]

## 2025-01-12 ENCOUNTER — Other Ambulatory Visit: Payer: Self-pay | Admitting: Gastroenterology

## 2025-01-12 ENCOUNTER — Ambulatory Visit: Admission: RE | Admit: 2025-01-12 | Discharge: 2025-01-12 | Disposition: A

## 2025-01-12 ENCOUNTER — Ambulatory Visit
Admission: RE | Admit: 2025-01-12 | Discharge: 2025-01-12 | Disposition: A | Source: Ambulatory Visit | Attending: Gastroenterology | Admitting: Gastroenterology

## 2025-01-12 DIAGNOSIS — M5451 Vertebrogenic low back pain: Secondary | ICD-10-CM

## 2025-01-15 ENCOUNTER — Telehealth: Payer: Self-pay

## 2025-01-17 ENCOUNTER — Telehealth: Payer: Self-pay

## 2025-01-17 NOTE — Progress Notes (Signed)
 Complex Care Management Note Care Guide Note  01/17/2025 Name: Brandi Bright MRN: [id number] DOB: 01-31-1971   Complex Care Management Outreach Attempts: A third unsuccessful outreach was attempted today to offer the patient with information about available complex care management services.  Follow Up Plan:  No further outreach attempts will be made at this time. We have been unable to contact the patient to offer or enroll patient in complex care management services.  Encounter Outcome:  No Answer left a message     Jon Colt [facility]  [facility] Guide, Phone: [phone] Fax: [phone] Website: [email or web address]

## 2025-01-26 ENCOUNTER — Ambulatory Visit: Admitting: Nurse Practitioner

## 2025-02-06 ENCOUNTER — Other Ambulatory Visit

## 2025-02-13 ENCOUNTER — Ambulatory Visit: Admitting: Oncology

## 2025-02-13 ENCOUNTER — Ambulatory Visit
